# Patient Record
Sex: Female | Born: 1960
Health system: Southern US, Community
[De-identification: ages and names within clinical notes are randomized; demographics above are authoritative.]

## PROBLEM LIST (undated history)

## (undated) DIAGNOSIS — D649 Anemia, unspecified: Secondary | ICD-10-CM

## (undated) DIAGNOSIS — I10 Essential (primary) hypertension: Secondary | ICD-10-CM

## (undated) DIAGNOSIS — N182 Chronic kidney disease, stage 2 (mild): Secondary | ICD-10-CM

## (undated) DIAGNOSIS — K219 Gastro-esophageal reflux disease without esophagitis: Secondary | ICD-10-CM

## (undated) DIAGNOSIS — E785 Hyperlipidemia, unspecified: Secondary | ICD-10-CM

## (undated) DIAGNOSIS — K922 Gastrointestinal hemorrhage, unspecified: Secondary | ICD-10-CM

## (undated) DIAGNOSIS — I213 ST elevation (STEMI) myocardial infarction of unspecified site: Secondary | ICD-10-CM

## (undated) DIAGNOSIS — IMO0001 Reserved for inherently not codable concepts without codable children: Secondary | ICD-10-CM

## (undated) DIAGNOSIS — E119 Type 2 diabetes mellitus without complications: Secondary | ICD-10-CM

## (undated) DIAGNOSIS — I2510 Atherosclerotic heart disease of native coronary artery without angina pectoris: Secondary | ICD-10-CM

## (undated) DIAGNOSIS — I509 Heart failure, unspecified: Secondary | ICD-10-CM

## (undated) DIAGNOSIS — E041 Nontoxic single thyroid nodule: Secondary | ICD-10-CM

## (undated) HISTORY — DX: Type 2 diabetes mellitus without complications: E11.9

## (undated) HISTORY — DX: Anemia, unspecified: D64.9

## (undated) HISTORY — PX: COLONOSCOPY: SHX174

## (undated) HISTORY — PX: APPENDECTOMY: SHX54

## (undated) HISTORY — DX: Gastrointestinal hemorrhage, unspecified: K92.2

## (undated) HISTORY — DX: Chronic kidney disease, stage 2 (mild): N18.2

## (undated) HISTORY — DX: Gastro-esophageal reflux disease without esophagitis: K21.9

---

## 1999-08-14 ENCOUNTER — Encounter: Payer: Self-pay | Admitting: Emergency Medicine

## 1999-08-14 ENCOUNTER — Emergency Department (HOSPITAL_COMMUNITY): Admission: EM | Admit: 1999-08-14 | Discharge: 1999-08-14 | Payer: Self-pay | Admitting: Emergency Medicine

## 2001-05-31 ENCOUNTER — Encounter: Payer: Self-pay | Admitting: Emergency Medicine

## 2001-05-31 ENCOUNTER — Emergency Department (HOSPITAL_COMMUNITY): Admission: EM | Admit: 2001-05-31 | Discharge: 2001-05-31 | Payer: Self-pay | Admitting: Emergency Medicine

## 2001-06-28 ENCOUNTER — Emergency Department (HOSPITAL_COMMUNITY): Admission: EM | Admit: 2001-06-28 | Discharge: 2001-06-28 | Payer: Self-pay | Admitting: Emergency Medicine

## 2003-10-04 ENCOUNTER — Emergency Department (HOSPITAL_COMMUNITY): Admission: EM | Admit: 2003-10-04 | Discharge: 2003-10-04 | Payer: Self-pay | Admitting: Emergency Medicine

## 2004-12-15 ENCOUNTER — Emergency Department (HOSPITAL_COMMUNITY): Admission: EM | Admit: 2004-12-15 | Discharge: 2004-12-15 | Payer: Self-pay | Admitting: Emergency Medicine

## 2008-08-17 ENCOUNTER — Emergency Department (HOSPITAL_COMMUNITY): Admission: EM | Admit: 2008-08-17 | Discharge: 2008-08-17 | Payer: Self-pay | Admitting: Emergency Medicine

## 2008-08-28 ENCOUNTER — Emergency Department (HOSPITAL_COMMUNITY): Admission: EM | Admit: 2008-08-28 | Discharge: 2008-08-28 | Payer: Self-pay | Admitting: Emergency Medicine

## 2010-05-07 ENCOUNTER — Ambulatory Visit (HOSPITAL_COMMUNITY): Admission: RE | Admit: 2010-05-07 | Discharge: 2010-05-07 | Payer: Self-pay | Admitting: Family Medicine

## 2011-01-11 ENCOUNTER — Emergency Department (HOSPITAL_COMMUNITY)
Admission: EM | Admit: 2011-01-11 | Discharge: 2011-01-11 | Disposition: A | Discharge status: Home / Self Care | Payer: Self-pay | Attending: Emergency Medicine | Admitting: Emergency Medicine

## 2011-01-11 DIAGNOSIS — L298 Other pruritus: Secondary | ICD-10-CM | POA: Insufficient documentation

## 2011-01-11 DIAGNOSIS — R21 Rash and other nonspecific skin eruption: Secondary | ICD-10-CM | POA: Insufficient documentation

## 2011-01-11 DIAGNOSIS — T7840XA Allergy, unspecified, initial encounter: Secondary | ICD-10-CM | POA: Insufficient documentation

## 2011-01-11 DIAGNOSIS — L2989 Other pruritus: Secondary | ICD-10-CM | POA: Insufficient documentation

## 2011-01-11 DIAGNOSIS — I10 Essential (primary) hypertension: Secondary | ICD-10-CM | POA: Insufficient documentation

## 2011-08-09 LAB — GLUCOSE, CAPILLARY: Glucose-Capillary: 91

## 2011-08-09 LAB — POCT I-STAT, CHEM 8
BUN: 6
Calcium, Ion: 1.07 — ABNORMAL LOW
Chloride: 107
Glucose, Bld: 88
HCT: 36
Potassium: 3.7

## 2011-08-09 LAB — CBC
Hemoglobin: 11.3 — ABNORMAL LOW
MCHC: 32.2
RBC: 4.25
WBC: 5.9

## 2011-08-09 LAB — DIFFERENTIAL
Lymphocytes Relative: 39
Lymphs Abs: 2.3
Monocytes Absolute: 0.7
Monocytes Relative: 13 — ABNORMAL HIGH
Neutro Abs: 2.6
Neutrophils Relative %: 44

## 2011-12-20 ENCOUNTER — Emergency Department (HOSPITAL_COMMUNITY): Payer: Self-pay

## 2011-12-20 ENCOUNTER — Encounter: Payer: Self-pay | Admitting: Family Medicine

## 2011-12-20 ENCOUNTER — Ambulatory Visit (INDEPENDENT_AMBULATORY_CARE_PROVIDER_SITE_OTHER): Payer: Self-pay | Admitting: Family Medicine

## 2011-12-20 ENCOUNTER — Observation Stay (HOSPITAL_COMMUNITY)
Admission: EM | Admit: 2011-12-20 | Discharge: 2011-12-21 | Disposition: A | Discharge status: Home / Self Care | Payer: Self-pay | Attending: Emergency Medicine | Admitting: Emergency Medicine

## 2011-12-20 ENCOUNTER — Encounter (HOSPITAL_COMMUNITY): Payer: Self-pay | Admitting: *Deleted

## 2011-12-20 ENCOUNTER — Other Ambulatory Visit: Payer: Self-pay

## 2011-12-20 DIAGNOSIS — R110 Nausea: Secondary | ICD-10-CM

## 2011-12-20 DIAGNOSIS — M79669 Pain in unspecified lower leg: Secondary | ICD-10-CM

## 2011-12-20 DIAGNOSIS — R0600 Dyspnea, unspecified: Secondary | ICD-10-CM

## 2011-12-20 DIAGNOSIS — M7989 Other specified soft tissue disorders: Secondary | ICD-10-CM

## 2011-12-20 DIAGNOSIS — R079 Chest pain, unspecified: Secondary | ICD-10-CM

## 2011-12-20 DIAGNOSIS — M546 Pain in thoracic spine: Secondary | ICD-10-CM

## 2011-12-20 DIAGNOSIS — M25569 Pain in unspecified knee: Secondary | ICD-10-CM

## 2011-12-20 DIAGNOSIS — R61 Generalized hyperhidrosis: Secondary | ICD-10-CM | POA: Insufficient documentation

## 2011-12-20 DIAGNOSIS — R0602 Shortness of breath: Secondary | ICD-10-CM | POA: Insufficient documentation

## 2011-12-20 LAB — D-DIMER, QUANTITATIVE: D-Dimer, Quant: 0.57 ug/mL-FEU — ABNORMAL HIGH (ref 0.00–0.48)

## 2011-12-20 LAB — CBC
HCT: 39.9 % (ref 36.0–46.0)
Hemoglobin: 13.7 g/dL (ref 12.0–15.0)
MCH: 31.6 pg (ref 26.0–34.0)
MCHC: 34.3 g/dL (ref 30.0–36.0)

## 2011-12-20 LAB — BASIC METABOLIC PANEL
CO2: 20 mEq/L (ref 19–32)
Calcium: 9.3 mg/dL (ref 8.4–10.5)
Chloride: 105 mEq/L (ref 96–112)
Creatinine, Ser: 0.9 mg/dL (ref 0.50–1.10)
Glucose, Bld: 73 mg/dL (ref 70–99)

## 2011-12-20 LAB — TROPONIN I: Troponin I: <0.3 ng/mL (ref <0.30)

## 2011-12-20 MED ORDER — ASPIRIN 81 MG PO CHEW: 324.0000 mg | CHEWABLE_TABLET | Freq: Once | ORAL | Status: DC

## 2011-12-20 MED ORDER — ASPIRIN 81 MG PO CHEW
324.0000 mg | CHEWABLE_TABLET | Freq: Once | ORAL | Status: AC
  Administered 2011-12-20: 324 mg via ORAL

## 2011-12-20 MED ORDER — NITROGLYCERIN 0.4 MG SL SUBL
0.4000 mg | SUBLINGUAL_TABLET | SUBLINGUAL | Status: DC | PRN
  Administered 2011-12-20: 0.4 mg via SUBLINGUAL
  Filled 2011-12-20: qty 25

## 2011-12-20 MED ORDER — HYDROXYZINE HCL 25 MG PO TABS
25.0000 mg | ORAL_TABLET | Freq: Once | ORAL | Status: AC
  Administered 2011-12-20: 25 mg via ORAL
  Filled 2011-12-20: qty 1

## 2011-12-20 NOTE — ED Provider Notes (Signed)
History     CSN: [id number]  Arrival date & time 12/20/11  1230   First MD Initiated Contact with Patient 12/20/11 1250      Chief Complaint  Patient presents with  . Chest Pain    (Consider location/radiation/quality/duration/timing/severity/associated sxs/prior treatment) HPI Comments: 51 yo female with onset of lower extremity hives yesterday and chest pain this morning.  Pain described as left sides, radiating to left arm and jaw, pressure and sharp like, worse with exertion, and associated with shortness of breath, nausea, and diaphoresis.    Patient is a 51 y.o. female presenting with chest pain. The history is provided by the patient.  Chest Pain The chest pain began 3 - 5 hours ago. Chest pain occurs constantly. The chest pain is unchanged. The severity of the pain is severe. The quality of the pain is described as sharp and pressure-like. The pain radiates to the left jaw and left arm. Exacerbated by: not worse with breathing. Primary symptoms include shortness of breath and nausea. Pertinent negatives for primary symptoms include no fever, no cough, no abdominal pain and no vomiting.  Associated symptoms include diaphoresis and lower extremity edema. She tried nothing for the symptoms.     History reviewed. No pertinent past medical history.  Past Surgical History  Procedure Date  . Appendectomy   . Cesarean section     No family history on file.  History  Substance Use Topics  . Smoking status: Never Smoker   . Smokeless tobacco: Not on file  . Alcohol Use: No    OB History    Grav Para Term Preterm Abortions TAB SAB Ect Mult Living                  Review of Systems  Constitutional: Positive for diaphoresis. Negative for fever.  HENT: Negative for congestion.   Respiratory: Positive for shortness of breath. Negative for cough.   Cardiovascular: Positive for chest pain.  Gastrointestinal: Positive for nausea. Negative for vomiting, abdominal pain and  diarrhea.  Genitourinary: Negative for difficulty urinating.  Skin: Positive for rash.  All other systems reviewed and are negative.    Allergies  Review of patient's allergies indicates no known allergies.  Home Medications  No current outpatient prescriptions on file.  BP 161/93  Pulse 76  Temp(Src) 98.2 F (36.8 C) (Oral)  Resp 18  SpO2 98%  Physical Exam  Nursing note and vitals reviewed. Constitutional: She is oriented to person, place, and time. She appears well-developed and well-nourished. No distress.  HENT:  Head: Normocephalic and atraumatic.  Mouth/Throat: Oropharynx is clear and moist.  Eyes: Conjunctivae are normal. Pupils are equal, round, and reactive to light. No scleral icterus.  Neck: Neck supple.  Cardiovascular: Normal rate, regular rhythm, normal heart sounds and intact distal pulses.   No murmur heard. Pulmonary/Chest: Effort normal and breath sounds normal. No stridor. No respiratory distress. She has no rales.  Abdominal: Soft. Bowel sounds are normal. She exhibits no distension. There is no tenderness.  Musculoskeletal: Normal range of motion.       Mild left foot edema.  2+ DP pulses bilaterally.  Neurological: She is alert and oriented to person, place, and time.  Skin: Skin is warm and dry. No rash noted.  Psychiatric: She has a normal mood and affect. Her behavior is normal.    ED Course  Procedures (including critical care time)  Labs Reviewed  BASIC METABOLIC PANEL - Abnormal; Notable for the following:  GFR calc non Af Amer 73 (*)    GFR calc Af Amer 84 (*)    All other components within normal limits  D-DIMER, QUANTITATIVE - Abnormal; Notable for the following:    D-Dimer, Quant 0.57 (*)    All other components within normal limits  TROPONIN I  CBC  DIFFERENTIAL   Dg Chest 2 View  12/20/2011  *RADIOLOGY REPORT*  Clinical Data: Chest pain  CHEST - 2 VIEW  Comparison: None.  Findings: Lungs are clear. No pleural effusion or  pneumothorax.  Cardiomediastinal silhouette is within normal limits.  Mild degenerative changes of the visualized thoracolumbar spine.  IMPRESSION: No evidence of acute cardiopulmonary disease.  Original Report Authenticated By: Charline Bills, M.D.   All radiology studies independently viewed by me.      Date: 12/20/2011  Rate: 79  Rhythm: normal sinus rhythm  QRS Axis: normal  Intervals: normal  ST/T Wave abnormalities: T wave inversions in III, aVF, V3-6  Conduction Disutrbances:none  Narrative Interpretation:   Old EKG Reviewed: T wave inversions now present in aVF, V3-6    1. Chest pain       MDM  51 yo female with onset of left sided chest pain this morning.  Immediate symptomology sounds consistent with anginal chest pain.  However, she is low risk (TIMI 0) and she has other symptoms concurrently, including an apparent allergic reaction which began yesterday.  Furthermore, she has unilateral left pedal edema.  Will initiate ACS workup and D-Dimer.    ACS workup initially reassuring.  D-dimer mildly elevated.  Will check LE dopplers.  If dopplers negative, do not think that she needs further testing for PE.  (She has no hypoxia, tachycardia, or pleuritic chest pain).  Do feel that she needs further ACS, so will place in CDU chest pain protocol.  CBC still pending at this time.        Warnell Forester, MD 12/20/11 [phone]

## 2011-12-20 NOTE — ED Notes (Addendum)
Patient presents to ed c/o via [facility] from urgent care c/o pain between  Her shoulder blades and noticed a rash on her lower legs last pm, states she had swelling to her left ankle with redness bialteral pedal pulses present. States she took benadryl last pm relieved the rash however feels the ankle is still swollen. States this am at 9am had sudden onset midsternal chest pain with radiation to her left arm/co sob with nausea this am. Patient was given asax4 and NTG x 1 at the urgent care. Patient was 10/10. EMS gave NTG x1. Pain is now 3/10.

## 2011-12-20 NOTE — Progress Notes (Signed)
  Subjective:    Patient ID: Virginia [name], female    DOB: Jun 18, 1961, 51 y.o.   MRN: [id number]  HPI  Virginia [name] is a 51 y.o. female  ** pulled emergently to see pt from front/triage due to cp, nausea and in bathroom, pt pulled emergently to room 7.  C/o chest pain.  Started yesterday with upper back pain without inciting injury or cause.  Later in day noticed rash on lower left leg, and swelling.  Thought was allergic rxn to food - took benadryl x 3 since yesterday - last dose 0530 today..  This am at 0900 noticed shortness of breath, and abrupt onset chest pain and pressure, 10/10 in nature, with left arm radiation, and nausea - no vomiting, flushing, and dizziness.    Also noticed more swelling and knot in lower left calf. Pain 8/10 currently. No other treatment taken. No recent air travel, prolonged car rides, surgery, or other known PE risk factors.    SH: Past[place], nonsmoker, no illicit drugs  FH: mom with CHF in early 60's  Review of Systems  Constitutional: Negative for fever.       Flushing feeling past few days - thought was hot flushes - no fevers noted.   Respiratory: Positive for chest tightness and shortness of breath.   Cardiovascular: Positive for chest pain and leg swelling. Negative for palpitations.  Gastrointestinal: Negative for abdominal pain.  Skin: Positive for color change and rash.  Neurological: Positive for dizziness and light-headedness. Negative for facial asymmetry, speech difficulty and weakness.      Objective:   Physical Exam  Constitutional: She is oriented to person, place, and time. She appears well-developed and well-nourished. She appears distressed.       Appears anxious.  HENT:  Head: Normocephalic and atraumatic.  Mouth/Throat: Oropharynx is clear and moist.  Eyes: EOM are normal. Pupils are equal, round, and reactive to light.  Neck: No tracheal deviation present.  Cardiovascular: Regular rhythm, normal heart sounds and intact distal  pulses.   No extrasystoles are present. Tachycardia present.  PMI is not displaced.   Pulmonary/Chest: Effort normal. No stridor. No respiratory distress. She has no wheezes. She has no rales. She exhibits no tenderness.  Abdominal: Soft. She exhibits no distension. There is no tenderness.  Neurological: She is alert and oriented to person, place, and time. She has normal strength. No sensory deficit.  Skin: Skin is warm.     Psychiatric: Her speech is normal and behavior is normal. Her mood appears anxious.    EKG: sr, rate 82, TWI in III, flat t in AVF.     Assessment & Plan:  Virginia [name] is a 51 y.o. female with :  1. Chest pain   2. Dyspnea   3. Thoracic back pain   4. Calf pain   5. Leg swelling   6. Nausea   DDX: early cellulitis of leg vs DVT with chest pain concerning for PE, acute coronary syndrome, Gerd, vs aneurysm with marked elevation of BP.  No apparent acute findings on EKG, and no focal neurologic findings.  O2 [place] placed, IV, monitor placed, aspirin 324mg  chewable, and nitroglycerin 0.4mg  SL given here, EMS transport to [facility]. - see Emergency Care flowsheet (scanned) for further details.

## 2011-12-20 NOTE — ED Provider Notes (Signed)
Patient in CDU under chest pain protocol.  Slightly elevated d-dimer.  Venous doppler of extremity negative.  12 lead reviewed, no evidence of ischemia.  Serial cardiac markers currently negative.  Patient resting comfortably without return of chest pain.  Lungs CTA bilaterally. S1/S2, RRR, no murmur.  Abdomen soft, bowel sounds present.  Strong distal pulses palpated all extremities.  Patient scheduled for echo stress test in AM.  Treatment and diagnostic plan discussed with patient.  12:22 AM  Patient report provided to Dr. Judd Lien at shift change.  Jimmye Norman, NP 12/21/11 Rich Fuchs

## 2011-12-20 NOTE — Progress Notes (Signed)
  Subjective:    Patient ID: Virginia [name], female    DOB: 15-Dec-1960, 51 y.o.   MRN: [id number]  HPI    Review of Systems     Objective:   Physical Exam    Procedure: 22g IV NS placed at 1142 at [facility].    Assessment & Plan:

## 2011-12-20 NOTE — Progress Notes (Signed)
*  PRELIMINARY RESULTS* Vascular Ultrasound Left lower extremity venous duplex has been completed.  Preliminary findings: Left leg negative for deep and superficial vein thrombosis.  Vanna Scotland 12/20/2011, 8:52 PM

## 2011-12-21 DIAGNOSIS — R072 Precordial pain: Secondary | ICD-10-CM

## 2011-12-21 MED ORDER — DIPHENHYDRAMINE HCL 25 MG PO CAPS
25.0000 mg | ORAL_CAPSULE | Freq: Once | ORAL | Status: AC
  Administered 2011-12-21: 25 mg via ORAL
  Filled 2011-12-21: qty 1

## 2011-12-21 NOTE — ED Provider Notes (Signed)
7:57 AM Patient is in CDU under observation, chest pain, protocol.  Patient is chest pain-free at this time.  On exam, pt is A&Ox4, NAD, RRR, no m/r/g, CTAB, abd soft, NT, extremities without edema, distal pulses intact and equal bilaterally.  Plan is for exercise stress test this morning.    10:26 AM Dr Eden Emms called, stress echo is normal.   Discussed results with patient.  Plan is for discharge home.   Rise Patience, [place] 12/21/11 1114

## 2011-12-21 NOTE — Progress Notes (Signed)
  Echocardiogram 2D Echocardiogram has been performed.  Juanita Laster Shaylie Eklund 12/21/2011, 10:22 AM

## 2011-12-21 NOTE — Discharge Instructions (Signed)
Please call [facility] today for a close follow up appointment.  You may return to the ER at any time for worsening condition or any new symptoms that concern you.   Chest Pain (Nonspecific) It is often hard to give a specific diagnosis for the cause of chest pain. There is always a chance that your pain could be related to something serious, such as a heart attack or a blood clot in the lungs. You need to follow up with your caregiver for further evaluation. CAUSES   Heartburn.   Pneumonia or bronchitis.   Anxiety and stress.   Inflammation around your heart (pericarditis) or lung (pleuritis or pleurisy).   A blood clot in the lung.   A collapsed lung (pneumothorax). It can develop suddenly on its own (spontaneous pneumothorax) or from injury (trauma) to the chest.  The chest wall is composed of bones, muscles, and cartilage. Any of these can be the source of the pain.  The bones can be bruised by injury.   The muscles or cartilage can be strained by coughing or overwork.   The cartilage can be affected by inflammation and become sore (costochondritis).  DIAGNOSIS  Lab tests or other studies, such as X-rays, an EKG, stress testing, or cardiac imaging, may be needed to find the cause of your pain.  TREATMENT   Treatment depends on what may be causing your chest pain. Treatment may include:   Acid blockers for heartburn.   Anti-inflammatory medicine.   Pain medicine for inflammatory conditions.   Antibiotics if an infection is present.   You may be advised to change lifestyle habits. This includes stopping smoking and avoiding caffeine and chocolate.   You may be advised to keep your head raised (elevated) when sleeping. This reduces the chance of acid going backward from your stomach into your esophagus.   Most of the time, nonspecific chest pain will improve within 2 to 3 days with rest and mild pain medicine.  HOME CARE INSTRUCTIONS   If  antibiotics were prescribed, take the full amount even if you start to feel better.   For the next few days, avoid physical activities that bring on chest pain. Continue physical activities as directed.   Do not smoke cigarettes or drink alcohol until your symptoms are gone.   Only take over-the-counter or prescription medicine for pain, discomfort, or fever as directed by your caregiver.   Follow your caregiver's suggestions for further testing if your chest pain does not go away.   Keep any follow-up appointments you made. If you do not go to an appointment, you could develop lasting (chronic) problems with pain. If there is any problem keeping an appointment, you must call to reschedule.  SEEK MEDICAL CARE IF:   You think you are having problems from the medicine you are taking. Read your medicine instructions carefully.   Your chest pain does not go away, even after treatment.   You develop a rash with blisters on your chest.  SEEK IMMEDIATE MEDICAL CARE IF:   You have increased chest pain or pain that spreads to your arm, neck, jaw, back, or belly (abdomen).   You develop shortness of breath, an increasing cough, or you are coughing up blood.   You have severe back or abdominal pain, feel sick to your stomach (nauseous) or throw up (vomit).   You develop severe weakness, fainting, or chills.   You have an oral temperature above 102 F (38.9 C), not  controlled by medicine.  THIS IS AN EMERGENCY. Do not wait to see if the pain will go away. Get medical help at once. Call your local emergency services (911 in [place] Do not drive yourself to the hospital. MAKE SURE YOU:   Understand these instructions.   Will watch your condition.   Will get help right away if you are not doing well or get worse.  Document Released: 08/03/2005 Document Revised: 07/06/2011 Document Reviewed: 05/29/2008 [facility] Patient Information 2012 [place], [place].Chest Pain Observation It is often hard to  give a specific diagnosis for the cause of chest pain. Your symptoms had a chance of being caused by inadequate oxygen delivery to your heart (angina). Angina that is not treated or evaluated can lead to a heart attack (myocardial infarction, MI) or death. Blood tests, electrocardiograms, and X-rays may have been done to help determine a possible cause of your chest pain. After evaluation and observation, your caregiver has determined that it is unlikely your pain was caused by angina. However, a full evaluation of your pain needs to be completed. You need to follow up with caregivers or diagnostic testing as directed. It is very important to keep your follow-up appointments. Not keeping your follow-up appointments could result in permanent heart damage, disability, or death. If there is any problem keeping your follow-up appointments, you must call your caregiver. HOME CARE INSTRUCTIONS  Due to the slight chance that your pain could be angina, it is important to follow healthy lifestyle habits and follow your caregiver's treatment plan:  Maintain a healthy weight.   Stay physically active and exercise regularly.   Decrease your salt intake.   Eat a diet low in saturated fats and cholesterol. Avoid foods fried in oil or made with fat. Talk to a dietician to learn about heart healthy foods.   Increase your fiber intake by including whole grains, vegetable, and fruits in your diet.   Avoid situations that cause stress, anger, or depression.   Take medication as advised by your caregiver. Report any side effects to your caregiver. Do not stop medications or adjust the dosages on your own.   Quit smoking. Do not use nicotine patches or gum until you check with your caregiver.   Keep your blood pressure, blood sugar, and cholesterol levels within normal limits.   Limit alcohol intake to no more than 1 drink per day for nonpregnant women and 2 drinks per day for men.   Stop abusing drugs.  SEEK  MEDICAL CARE IF: You have severe chest pain or pressure which may include symptoms such as:  Pain or pressure in the arms, neck, jaw, or back.   Profuse sweating.   Feeling sick to your stomach (nauseous).   Feeling short of breath while at rest.   Having a fast or irregular heartbeat.   You have chest pain that does not get better after rest or after taking your usual medicine.   You wake from sleep with chest pain.   You feel dizzy, faint, or experience extreme fatigue.   You notice increasing shortness of breath during rest, sleep, or with activity.   You are unable to sleep because you cannot breathe.   You develop a frequent cough or you are coughing up blood.   You have severe back or abdominal pain, are nauseated, or throw up (vomit).   You develop severe weakness, dizziness, fainting, or chills.  Any of these symptoms may represent a serious problem that is an emergency. Do not  wait to see if the symptoms will go away. Call your local emergency services (911 in the [place] Do not drive yourself to the hospital. MAKE SURE YOU:  Understand these instructions.   Will watch your condition.   Will get help right away if you are not doing well or get worse.  Document Released: 11/26/2010 Document Revised: 07/06/2011 Document Reviewed: 11/26/2010 [facility] Patient Information 2012 [place], [place].

## 2011-12-21 NOTE — ED Provider Notes (Signed)
I saw and evaluated the patient, reviewed the resident's note and I agree with the findings and plan.   .Face to face Exam:  General:  Awake HEENT:  Atraumatic Resp:  Normal effort Abd:  Nondistended Neuro:No focal weakness Lymph: No adenopathy   Nelia Shi, MD 12/21/11 1051

## 2011-12-21 NOTE — Progress Notes (Signed)
Observation review is complete. 

## 2011-12-21 NOTE — ED Provider Notes (Signed)
Medical screening examination/treatment/procedure(s) were performed by non-physician practitioner and as supervising physician I was immediately available for consultation/collaboration. Patient seen under supervision of Dr.Beaton and transferred to CDU awaiting cardiac workup  Doug Sou, MD 12/21/11 [phone]

## 2012-03-21 ENCOUNTER — Emergency Department (HOSPITAL_COMMUNITY)
Admission: EM | Admit: 2012-03-21 | Discharge: 2012-03-21 | Disposition: A | Discharge status: Home / Self Care | Payer: Self-pay | Attending: Emergency Medicine | Admitting: Emergency Medicine

## 2012-03-21 ENCOUNTER — Encounter (HOSPITAL_COMMUNITY): Payer: Self-pay | Admitting: *Deleted

## 2012-03-21 DIAGNOSIS — S0990XA Unspecified injury of head, initial encounter: Secondary | ICD-10-CM | POA: Insufficient documentation

## 2012-03-21 DIAGNOSIS — W2209XA Striking against other stationary object, initial encounter: Secondary | ICD-10-CM | POA: Insufficient documentation

## 2012-03-21 MED ORDER — HYDROCODONE-ACETAMINOPHEN 5-325 MG PO TABS: 1.0000 | ORAL_TABLET | Freq: Four times a day (QID) | ORAL | Status: AC | PRN

## 2012-03-21 MED ORDER — TETANUS-DIPHTH-ACELL PERTUSSIS 5-2.5-18.5 LF-MCG/0.5 IM SUSP
0.5000 mL | Freq: Once | INTRAMUSCULAR | Status: AC
  Administered 2012-03-21: 0.5 mL via INTRAMUSCULAR
  Filled 2012-03-21: qty 0.5

## 2012-03-21 MED ORDER — IBUPROFEN 200 MG PO TABS
600.0000 mg | ORAL_TABLET | Freq: Once | ORAL | Status: AC
  Administered 2012-03-21: 600 mg via ORAL
  Filled 2012-03-21: qty 3

## 2012-03-21 MED ORDER — IBUPROFEN 800 MG PO TABS: 800.0000 mg | ORAL_TABLET | Freq: Three times a day (TID) | ORAL | Status: AC

## 2012-03-21 NOTE — Discharge Instructions (Signed)
Use the resource guide listed below to help you find a doctor to follow up with if you do not already have one to followup with.Use your pain medication as prescribed and do not operate heavy machinery while on pain medication. Note that your pain medication contains acetaminophen (Tylenol) & its is not reccommended that you use additional acetaminophen (Tylenol) while taking this medication. Take your full course of antibiotics. Read the instructions below. Be sure to have a full meal with your Motrin.   Cryotherapy Cryotherapy means treatment with cold. Ice or gel packs can be used to reduce both pain and swelling. Ice is the most helpful within the first 24 to 48 hours after an injury or flareup from overusing a muscle or joint. Sprains, strains, spasms, burning pain, shooting pain, and aches can all be eased with ice. Ice can also be used when recovering from surgery. Ice is effective, has very few side effects, and is safe for most people to use. PRECAUTIONS  Ice is not a safe treatment option for people with:  Raynaud's phenomenon. This is a condition affecting small blood vessels in the extremities. Exposure to cold may cause your problems to return.   Cold hypersensitivity. There are many forms of cold hypersensitivity, including:   Cold urticaria. Red, itchy hives appear on the skin when the tissues begin to warm after being iced.   Cold erythema. This is a red, itchy rash caused by exposure to cold.   Cold hemoglobinuria. Red blood cells break down when the tissues begin to warm after being iced. The hemoglobin that carry oxygen are passed into the urine because they cannot combine with blood proteins fast enough.   Numbness or altered sensitivity in the area being iced.  If you have any of the following conditions, do not use ice until you have discussed cryotherapy with your caregiver:  Heart conditions, such as arrhythmia, angina, or chronic heart disease.   High blood pressure.     Healing wounds or open skin in the area being iced.   Current infections.   Rheumatoid arthritis.   Poor circulation.   Diabetes.  Ice slows the blood flow in the region it is applied. This is beneficial when trying to stop inflamed tissues from spreading irritating chemicals to surrounding tissues. However, if you expose your skin to cold temperatures for too long or without the proper protection, you can damage your skin or nerves. Watch for signs of skin damage due to cold. HOME CARE INSTRUCTIONS Follow these tips to use ice and cold packs safely.  Place a dry or damp towel between the ice and skin. A damp towel will cool the skin more quickly, so you may need to shorten the time that the ice is used.   For a more rapid response, add gentle compression to the ice.   Ice for no more than 10 to 20 minutes at a time. The bonier the area you are icing, the less time it will take to get the benefits of ice.   Check your skin after 5 minutes to make sure there are no signs of a poor response to cold or skin damage.   Rest 20 minutes or more in between uses.   Once your skin is numb, you can end your treatment. You can test numbness by very lightly touching your skin. The touch should be so light that you do not see the skin dimple from the pressure of your fingertip. When using ice, most people will  feel these normal sensations in this order: cold, burning, aching, and numbness.   Do not use ice on someone who cannot communicate their responses to pain, such as small children or people with dementia.  HOW TO MAKE AN ICE PACK Ice packs are the most common way to use ice therapy. Other methods include ice massage, ice baths, and cryo-sprays. Muscle creams that cause a cold, tingly feeling do not offer the same benefits that ice offers and should not be used as a substitute unless recommended by your caregiver. To make an ice pack, do one of the following:  Place crushed ice or a bag of  frozen vegetables in a sealable plastic bag. Squeeze out the excess air. Place this bag inside another plastic bag. Slide the bag into a pillowcase or place a damp towel between your skin and the bag.   Mix 3 parts water with 1 part rubbing alcohol. Freeze the mixture in a sealable plastic bag. When you remove the mixture from the freezer, it will be slushy. Squeeze out the excess air. Place this bag inside another plastic bag. Slide the bag into a pillowcase or place a damp towel between your skin and the bag.  SEEK MEDICAL CARE IF:  You develop white spots on your skin. This may give the skin a blotchy (mottled) appearance.   Your skin turns blue or pale.   Your skin becomes waxy or hard.   Your swelling gets worse.  MAKE SURE YOU:   Understand these instructions.   Will watch your condition.   Will get help right away if you are not doing well or get worse.  Document Released: 06/20/2011 Document Revised: 10/13/2011 Document Reviewed: 06/20/2011 [facility] Patient Information 2012 [place], [place].  RESOURCE GUIDE  Dental Problems  Patients with Medicaid: [facility] [phone]                                           [phone] [address] [employer] [address] Phone:  [phone]                                                  Phone:  [phone]  If unable to pay or uninsured, contact:  [facility] or [facility]. to become qualified for the adult dental clinic.  Chronic Pain Problems Contact [facility]  [phone] Patients need to be referred by their primary care doctor.  Insufficient Money for Medicine Contact [employer] Way:  call "211" or [facility] [phone].  No Primary Care Doctor Call [facility]  [phone] Other agencies that provide inexpensive medical care    [facility]  [phone]    [facility]  [phone]    [facility]  9516189505     [facility]  [phone]    [facility]  6073442825    [facility]  [phone]  Psychological Services [facility]  [phone] [facility]  [phone] [facility]   [phone] (emergency services [phone])  Substance Abuse Resources [facility]  [phone] Addiction  [facility] [phone] [facility] [place] [phone] Floydene Flock [phone] Residential & Outpatient Substance Abuse Program  [phone]  Abuse/Neglect [facility] Child Abuse Hotline 2690283182 [facility] Child Abuse Hotline 612-771-7561 (After Hours)  Emergency Shelter [facility] [phone]  Maternity Homes Room at the [place] [facility] [place] [phone] [facility] [phone]  MRSA Hotline #:   815-318-7738    [facility] Resources  [facility]     [employer]                          [facility] Dept. [address] [place]                       [address]      [address]  Blondell Reveal Phone:  [phone]                                   Phone:  [phone]                 Phone:  [phone]  [facility] Mental Health Phone:  [phone]  [facility] Child Abuse Hotline [phone] [phone] (After Hours)

## 2012-03-21 NOTE — ED Provider Notes (Signed)
History     CSN: [id number]  Arrival date & time 03/21/12  1643   First MD Initiated Contact with Patient 03/21/12 1706      Chief Complaint  Patient presents with  . Head Injury    (Consider location/radiation/quality/duration/timing/severity/associated sxs/prior treatment) Patient is a 51 y.o. female presenting with head injury. The history is provided by the patient.  Head Injury  The incident occurred 1 to 2 hours ago. She came to the ER via walk-in. Injury mechanism: walked into sliding glass door. There was no loss of consciousness. There was no blood loss. The quality of the pain is described as throbbing. The pain is at a severity of 4/10. The pain is mild. The pain has been constant since the injury. Pertinent negatives include no numbness, no blurred vision, no vomiting, no tinnitus, no disorientation, no weakness and no memory loss. She was found conscious by EMS personnel. Treatment prior to arrival: none. She has tried nothing for the symptoms.    History reviewed. No pertinent past medical history.  Past Surgical History  Procedure Date  . Appendectomy   . Cesarean section     No family history on file.  History  Substance Use Topics  . Smoking status: Never Smoker   . Smokeless tobacco: Not on file  . Alcohol Use: No    OB History    Grav Para Term Preterm Abortions TAB SAB Ect Mult Living                  Review of Systems  Constitutional: Negative for fever, chills and appetite change.  HENT: Negative for congestion, neck pain, neck stiffness and tinnitus.   Eyes: Negative for blurred vision and visual disturbance.  Respiratory: Negative for shortness of breath.   Cardiovascular: Negative for chest pain and leg swelling.  Gastrointestinal: Negative for vomiting and abdominal pain.  Genitourinary: Negative for dysuria, urgency and frequency.  Neurological: Positive for headaches. Negative for dizziness, syncope, weakness, light-headedness and  numbness.  Psychiatric/Behavioral: Negative for memory loss and confusion.  All other systems reviewed and are negative.    Allergies  Review of patient's allergies indicates no known allergies.  Home Medications  No current outpatient prescriptions on file.  BP 156/95  Pulse 87  Temp 98.9 F (37.2 C)  Resp 16  SpO2 99%  Physical Exam  Nursing note and vitals reviewed. Constitutional: She is oriented to person, place, and time. She appears well-developed and well-nourished. No distress.  HENT:  Head: Normocephalic.       Mild tenderness to palpation over nasal bone, no septal hematoma or epistaxis.  No raccoon sign or Battle sign.  No tenderness to palpation over the superior and inferior orbits.  Mild superficial 0.3 cm laceration of upper lip, not currently bleeding.   Eyes: Conjunctivae and EOM are normal.       No pain or catching with EOMs.  No evidence of orbital swelling or erythema.    Neck: Normal range of motion.  Pulmonary/Chest: Effort normal.  Musculoskeletal: Normal range of motion.  Neurological: She is alert and oriented to person, place, and time.       CN III-VII, coordination, gait, & sensation intact. Strength 5/5 bilaterally. Negative romberg.   Skin: Skin is warm and dry. No rash noted. She is not diaphoretic.  Psychiatric: She has a normal mood and affect. Her behavior is normal.    ED Course  Procedures (including critical care time)  Labs Reviewed - No data to  display No results found.   No diagnosis found.    MDM  Head trauma    Pt HA treated and improved while in ED.  Presentation is like pts typical HA and non concerning for [facility], ICH, Meningitis, or temporal arteritis. PE non-concerning for facial fractures. Nasal airway intact. Pt is afebrile with no focal neuro deficits, nuchal rigidity, or change in vision. Pt is to follow up with PCP. Discussed symptoms of post concussive syndrome and reasons to return to ED. Pt verbalizes  understanding and is agreeable with plan to dc.         Jaci Carrel, [place] 03/21/12 1759

## 2012-03-21 NOTE — ED Notes (Signed)
Pt states walked in to glass door. Reports headache. No injury/lacerations noted. No LOC.

## 2012-03-21 NOTE — ED Provider Notes (Signed)
Medical screening examination/treatment/procedure(s) were performed by non-physician practitioner and as supervising physician I was immediately available for consultation/collaboration.   Celene Kras, MD 03/21/12 [phone]

## 2015-04-12 ENCOUNTER — Encounter (HOSPITAL_COMMUNITY): Payer: Self-pay | Admitting: Emergency Medicine

## 2015-04-12 ENCOUNTER — Emergency Department (HOSPITAL_COMMUNITY): Payer: Self-pay

## 2015-04-12 ENCOUNTER — Emergency Department (HOSPITAL_COMMUNITY)
Admission: EM | Admit: 2015-04-12 | Discharge: 2015-04-12 | Disposition: A | Discharge status: Home / Self Care | Payer: Self-pay | Attending: Emergency Medicine | Admitting: Emergency Medicine

## 2015-04-12 DIAGNOSIS — S6992XA Unspecified injury of left wrist, hand and finger(s), initial encounter: Secondary | ICD-10-CM | POA: Insufficient documentation

## 2015-04-12 DIAGNOSIS — E079 Disorder of thyroid, unspecified: Secondary | ICD-10-CM | POA: Insufficient documentation

## 2015-04-12 DIAGNOSIS — Y92410 Unspecified street and highway as the place of occurrence of the external cause: Secondary | ICD-10-CM | POA: Insufficient documentation

## 2015-04-12 DIAGNOSIS — S8992XA Unspecified injury of left lower leg, initial encounter: Secondary | ICD-10-CM | POA: Insufficient documentation

## 2015-04-12 DIAGNOSIS — S99912A Unspecified injury of left ankle, initial encounter: Secondary | ICD-10-CM | POA: Insufficient documentation

## 2015-04-12 DIAGNOSIS — S79912A Unspecified injury of left hip, initial encounter: Secondary | ICD-10-CM | POA: Insufficient documentation

## 2015-04-12 DIAGNOSIS — T148 Other injury of unspecified body region: Secondary | ICD-10-CM | POA: Insufficient documentation

## 2015-04-12 DIAGNOSIS — S199XXA Unspecified injury of neck, initial encounter: Secondary | ICD-10-CM | POA: Insufficient documentation

## 2015-04-12 DIAGNOSIS — Y9389 Activity, other specified: Secondary | ICD-10-CM | POA: Insufficient documentation

## 2015-04-12 DIAGNOSIS — Y998 Other external cause status: Secondary | ICD-10-CM | POA: Insufficient documentation

## 2015-04-12 DIAGNOSIS — M542 Cervicalgia: Secondary | ICD-10-CM

## 2015-04-12 DIAGNOSIS — S299XXA Unspecified injury of thorax, initial encounter: Secondary | ICD-10-CM | POA: Insufficient documentation

## 2015-04-12 DIAGNOSIS — T148XXA Other injury of unspecified body region, initial encounter: Secondary | ICD-10-CM

## 2015-04-12 DIAGNOSIS — S3992XA Unspecified injury of lower back, initial encounter: Secondary | ICD-10-CM | POA: Insufficient documentation

## 2015-04-12 DIAGNOSIS — M25572 Pain in left ankle and joints of left foot: Secondary | ICD-10-CM

## 2015-04-12 DIAGNOSIS — E0789 Other specified disorders of thyroid: Secondary | ICD-10-CM

## 2015-04-12 MED ORDER — NAPROXEN 500 MG PO TABS: 500.0000 mg | ORAL_TABLET | Freq: Two times a day (BID) | ORAL | Status: DC

## 2015-04-12 MED ORDER — METHOCARBAMOL 500 MG PO TABS: 1000.0000 mg | ORAL_TABLET | Freq: Four times a day (QID) | ORAL | Status: DC

## 2015-04-12 MED ORDER — IBUPROFEN 800 MG PO TABS
800.0000 mg | ORAL_TABLET | Freq: Once | ORAL | Status: AC
  Administered 2015-04-12: 800 mg via ORAL
  Filled 2015-04-12: qty 1
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
## 2015-04-12 NOTE — ED Notes (Signed)
Patient transported to X-ray 
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
## 2015-06-16 ENCOUNTER — Inpatient Hospital Stay (HOSPITAL_COMMUNITY)
Admission: EM | Admit: 2015-06-16 | Discharge: 2015-06-19 | DRG: 249 | Disposition: A | Discharge status: Home / Self Care | Payer: Self-pay | Attending: Interventional Cardiology | Admitting: Interventional Cardiology

## 2015-06-16 DIAGNOSIS — I213 ST elevation (STEMI) myocardial infarction of unspecified site: Secondary | ICD-10-CM | POA: Diagnosis present

## 2015-06-16 DIAGNOSIS — E785 Hyperlipidemia, unspecified: Secondary | ICD-10-CM | POA: Diagnosis present

## 2015-06-16 DIAGNOSIS — Z791 Long term (current) use of non-steroidal anti-inflammatories (NSAID): Secondary | ICD-10-CM

## 2015-06-16 DIAGNOSIS — Z955 Presence of coronary angioplasty implant and graft: Secondary | ICD-10-CM

## 2015-06-16 DIAGNOSIS — Z79899 Other long term (current) drug therapy: Secondary | ICD-10-CM

## 2015-06-16 DIAGNOSIS — I2119 ST elevation (STEMI) myocardial infarction involving other coronary artery of inferior wall: Principal | ICD-10-CM | POA: Diagnosis present

## 2015-06-16 DIAGNOSIS — I2510 Atherosclerotic heart disease of native coronary artery without angina pectoris: Secondary | ICD-10-CM | POA: Diagnosis present

## 2015-06-16 HISTORY — DX: ST elevation (STEMI) myocardial infarction of unspecified site: I21.3

## 2015-06-16 HISTORY — DX: Hyperlipidemia, unspecified: E78.5

## 2015-06-16 NOTE — ED Provider Notes (Signed)
CSN: [id number]     Arrival date & time 06/16/15  2351 History  This chart was scribed for Virginia Fuel, MD by Randa Evens, ED Scribe. This patient was seen in room D33C/D33C and the patient's care was started at 11:47 PM.     Chief Complaint  Patient presents with  . Chest Pain   Patient is a 54 y.o. female presenting with chest pain. The history is provided by the patient. No language interpreter was used.  Chest Pain Pain location:  Substernal area Associated symptoms: diaphoresis, nausea, shortness of breath and vomiting    HPI Comments: Virginia [name] is a 54 y.o. female brought in by ambulance, who presents to the Emergency Department complaining of sudden CP onset tonight at 10 PM described as a pressure on her chest. Pt rates the severity of pain 8/10. Pt states she has had associated diaphoresis, nausea, vomiting, and SOB. Pt states she was at rest during the onset of pain. Pt states she took 1 aspirin with no relief. Ems gave her morphine and nitroglycerin with on slight relief. Pt denies any worsening or alleviating factors. Pt states that the pain is non radiating.   No past medical history on file. Past Surgical History  Procedure Laterality Date  . Appendectomy    . Cesarean section     No family history on file. History  Substance Use Topics  . Smoking status: Never Smoker   . Smokeless tobacco: Not on file  . Alcohol Use: No   OB History    No data available      Review of Systems  Constitutional: Positive for diaphoresis.  Respiratory: Positive for shortness of breath.   Cardiovascular: Positive for chest pain.  Gastrointestinal: Positive for nausea and vomiting.  All other systems reviewed and are negative.     Allergies  Review of patient's allergies indicates no known allergies.  Home Medications   Prior to Admission medications   Medication Sig Start Date End Date Taking? Authorizing Provider  methocarbamol (ROBAXIN) 500 MG tablet Take 2  tablets (1,000 mg total) by mouth 4 (four) times daily. 04/12/15   Carlisle Cater, PA-C  naproxen (NAPROSYN) 500 MG tablet Take 1 tablet (500 mg total) by mouth 2 (two) times daily. 04/12/15   Carlisle Cater, PA-C   Pulse 44  Temp(Src) 98.6 F (37 C) (Oral)  Resp 26  Ht 5\' 6"  (1.676 m)  Wt 180 lb (81.647 kg)  BMI 29.07 kg/m2  SpO2 99%   Physical Exam  Constitutional: She is oriented to person, place, and time. She appears well-developed and well-nourished.  Appears mildly uncomfortable.   HENT:  Head: Normocephalic and atraumatic.  Eyes: Conjunctivae and EOM are normal. Pupils are equal, round, and reactive to light.  Neck: Normal range of motion. Neck supple. No JVD present.  Cardiovascular: Normal rate and regular rhythm.   No murmur heard. Pulmonary/Chest: Effort normal and breath sounds normal. She has no wheezes. She has no rales. She exhibits no tenderness.  Abdominal: Soft. Bowel sounds are normal. She exhibits no distension and no mass. There is no tenderness.  Musculoskeletal: Normal range of motion. She exhibits no edema.  Lymphadenopathy:    She has no cervical adenopathy.  Neurological: She is alert and oriented to person, place, and time. No cranial nerve deficit. She exhibits normal muscle tone.  Skin: Skin is warm and dry. No rash noted.  Psychiatric: She has a normal mood and affect. Her behavior is normal. Judgment and thought content  normal.  Nursing note and vitals reviewed.   ED Course  Procedures (including critical care time) DIAGNOSTIC STUDIES: Oxygen Saturation is 99% on RA, normal by my interpretation.    COORDINATION OF CARE: 12:00 AM-Discussed treatment plan with pt at bedside and pt agreed to plan.    Labs Review Results for orders placed or performed during the hospital encounter of 06/16/15  CBC  Result Value Ref Range   WBC 9.8 4.0 - 10.5 K/uL   RBC 4.35 3.87 - 5.11 MIL/uL   Hemoglobin 12.9 12.0 - 15.0 g/dL   HCT 39.0 36.0 - 46.0 %   MCV 89.7  78.0 - 100.0 fL   MCH 29.7 26.0 - 34.0 pg   MCHC 33.1 30.0 - 36.0 g/dL   RDW 14.3 11.5 - 15.5 %   Platelets 189 150 - 400 K/uL  Differential  Result Value Ref Range   Neutrophils Relative % 44 43 - 77 %   Neutro Abs 4.3 1.7 - 7.7 K/uL   Lymphocytes Relative 47 (H) 12 - 46 %   Lymphs Abs 4.6 (H) 0.7 - 4.0 K/uL   Monocytes Relative 6 3 - 12 %   Monocytes Absolute 0.6 0.1 - 1.0 K/uL   Eosinophils Relative 3 0 - 5 %   Eosinophils Absolute 0.2 0.0 - 0.7 K/uL   Basophils Relative 0 0 - 1 %   Basophils Absolute 0.0 0.0 - 0.1 K/uL  Protime-INR  Result Value Ref Range   Prothrombin Time 15.0 11.6 - 15.2 seconds   INR 1.16 0.00 - 1.49  APTT  Result Value Ref Range   aPTT >200 (HH) 24 - 37 seconds  Basic metabolic panel  Result Value Ref Range   Sodium 139 135 - 145 mmol/L   Potassium 3.6 3.5 - 5.1 mmol/L   Chloride 104 101 - 111 mmol/L   CO2 23 22 - 32 mmol/L   Glucose, Bld 180 (H) 65 - 99 mg/dL   BUN 12 6 - 20 mg/dL   Creatinine, Ser 1.29 (H) 0.44 - 1.00 mg/dL   Calcium 9.1 8.9 - 10.3 mg/dL   GFR calc non Af Amer 46 (L) >60 mL/min   GFR calc Af Amer 53 (L) >60 mL/min   Anion gap 12 5 - 15  Troponin I  Result Value Ref Range   Troponin I 0.03 <0.031 ng/mL     EKG Interpretation   Date/Time:  Tuesday June 16 2015 23:56:26 EDT Ventricular Rate:  77 PR Interval:  148 QRS Duration: 71 QT Interval:  374 QTC Calculation: 423 R Axis:   76 Text Interpretation:  Sinus rhythm Atrial premature complex Inferior  infarct, acute (RCA) Lateral leads are also involved Probable RV  involvement, suggest recording right precordial leads When compared with  ECG of 12/20/2011, ** ** ACUTE MI / STEMI ** ** is now Present Confirmed by  [facility]  MD, Xianna Siverling ([id number]) on 06/17/2015 12:03:30 AM      MDM   Final diagnoses:  ST elevation myocardial infarction (STEMI), unspecified artery      Chest pain with ECG changes showing evidence of ST elevation myocardial infarction in inferior and  anterolateral leads. Code STEMI is activated. She is given heparin and given morphine for pain. She received aspirin at home. Case was discussed with Dr. Irish Lack who is on-call for  STEMI, and arrangements are made to take the patient to the catheterization lab for emergency PCI.   I personally performed the services described in this documentation, which was  scribed in my presence. The recorded information has been reviewed and is accurate.       Virginia Fuel, MD 75/30/10 4045

## 2015-06-16 NOTE — ED Notes (Signed)
Pt c/o sudden onset CP 0221, Dr Roxanne Mins called STEMI.

## 2015-06-17 ENCOUNTER — Inpatient Hospital Stay (HOSPITAL_COMMUNITY): Payer: Self-pay

## 2015-06-17 ENCOUNTER — Encounter (HOSPITAL_COMMUNITY)
Admission: EM | Disposition: A | Discharge status: Home / Self Care | Payer: Self-pay | Source: Home / Self Care | Attending: Interventional Cardiology

## 2015-06-17 ENCOUNTER — Encounter (HOSPITAL_COMMUNITY): Payer: Self-pay | Admitting: Emergency Medicine

## 2015-06-17 DIAGNOSIS — I2510 Atherosclerotic heart disease of native coronary artery without angina pectoris: Secondary | ICD-10-CM

## 2015-06-17 DIAGNOSIS — E785 Hyperlipidemia, unspecified: Secondary | ICD-10-CM | POA: Diagnosis present

## 2015-06-17 DIAGNOSIS — I2121 ST elevation (STEMI) myocardial infarction involving left circumflex coronary artery: Secondary | ICD-10-CM

## 2015-06-17 DIAGNOSIS — I213 ST elevation (STEMI) myocardial infarction of unspecified site: Secondary | ICD-10-CM | POA: Diagnosis present

## 2015-06-17 HISTORY — PX: CARDIAC CATHETERIZATION: SHX172

## 2015-06-17 LAB — POCT I-STAT, CHEM 8
BUN: 11 mg/dL (ref 6–20)
CHLORIDE: 102 mmol/L (ref 101–111)
CREATININE: 1 mg/dL (ref 0.44–1.00)
Calcium, Ion: 1.13 mmol/L (ref 1.12–1.23)
GLUCOSE: 188 mg/dL — AB (ref 65–99)
HCT: 40 % (ref 36.0–46.0)
Hemoglobin: 13.6 g/dL (ref 12.0–15.0)
POTASSIUM: 3.2 mmol/L — AB (ref 3.5–5.1)
Sodium: 139 mmol/L (ref 135–145)
TCO2: 23 mmol/L (ref 0–100)

## 2015-06-17 LAB — BASIC METABOLIC PANEL
Anion gap: 12 (ref 5–15)
Anion gap: 9 (ref 5–15)
BUN: 12 mg/dL (ref 6–20)
BUN: 10 mg/dL (ref 6–20)
CO2: 23 mmol/L (ref 22–32)
CO2: 25 mmol/L (ref 22–32)
Calcium: 9.1 mg/dL (ref 8.9–10.3)
Calcium: 8.7 mg/dL — ABNORMAL LOW (ref 8.9–10.3)
Chloride: 104 mmol/L (ref 101–111)
Chloride: 104 mmol/L (ref 101–111)
Creatinine, Ser: 1.29 mg/dL — ABNORMAL HIGH (ref 0.44–1.00)
Creatinine, Ser: 1.1 mg/dL — ABNORMAL HIGH (ref 0.44–1.00)
GFR calc Af Amer: 53 mL/min — ABNORMAL LOW (ref >60)
GFR calc non Af Amer: 46 mL/min — ABNORMAL LOW (ref >60)
GFR, EST NON AFRICAN AMERICAN: 55 mL/min — AB (ref >60)
Glucose, Bld: 180 mg/dL — ABNORMAL HIGH (ref 65–99)
Glucose, Bld: 143 mg/dL — ABNORMAL HIGH (ref 65–99)
POTASSIUM: 4.1 mmol/L (ref 3.5–5.1)
Potassium: 3.6 mmol/L (ref 3.5–5.1)
Sodium: 139 mmol/L (ref 135–145)
Sodium: 138 mmol/L (ref 135–145)

## 2015-06-17 LAB — DIFFERENTIAL
BASOS ABS: 0 10*3/uL (ref 0.0–0.1)
Basophils Relative: 0 % (ref 0–1)
EOS ABS: 0.2 10*3/uL (ref 0.0–0.7)
Eosinophils Relative: 3 % (ref 0–5)
LYMPHS ABS: 4.6 10*3/uL — AB (ref 0.7–4.0)
LYMPHS PCT: 47 % — AB (ref 12–46)
MONO ABS: 0.6 10*3/uL (ref 0.1–1.0)
Monocytes Relative: 6 % (ref 3–12)
NEUTROS ABS: 4.3 10*3/uL (ref 1.7–7.7)
Neutrophils Relative %: 44 % (ref 43–77)

## 2015-06-17 LAB — TSH: TSH: 0.963 u[IU]/mL (ref 0.350–4.500)

## 2015-06-17 LAB — MRSA PCR SCREENING: MRSA by PCR: NEGATIVE

## 2015-06-17 LAB — PROTIME-INR
INR: 1.16 (ref 0.00–1.49)
Prothrombin Time: 15 seconds (ref 11.6–15.2)

## 2015-06-17 LAB — CBC
HEMATOCRIT: 38.5 % (ref 36.0–46.0)
HEMATOCRIT: 39 % (ref 36.0–46.0)
Hemoglobin: 12.5 g/dL (ref 12.0–15.0)
Hemoglobin: 12.9 g/dL (ref 12.0–15.0)
MCH: 29.1 pg (ref 26.0–34.0)
MCH: 29.7 pg (ref 26.0–34.0)
MCHC: 32.5 g/dL (ref 30.0–36.0)
MCHC: 33.1 g/dL (ref 30.0–36.0)
MCV: 89.7 fL (ref 78.0–100.0)
MCV: 89.7 fL (ref 78.0–100.0)
PLATELETS: 189 10*3/uL (ref 150–400)
Platelets: 190 10*3/uL (ref 150–400)
RBC: 4.29 MIL/uL (ref 3.87–5.11)
RBC: 4.35 MIL/uL (ref 3.87–5.11)
RDW: 14.3 % (ref 11.5–15.5)
RDW: 14.4 % (ref 11.5–15.5)
WBC: 7.2 10*3/uL (ref 4.0–10.5)
WBC: 9.8 10*3/uL (ref 4.0–10.5)

## 2015-06-17 LAB — POCT ACTIVATED CLOTTING TIME: Activated Clotting Time: 583 seconds

## 2015-06-17 LAB — TROPONIN I
TROPONIN I: 12.04 ng/mL — AB (ref <0.031)
TROPONIN I: 10.13 ng/mL — AB (ref <0.031)
Troponin I: 0.03 ng/mL (ref <0.031)
Troponin I: 11.29 ng/mL (ref <0.031)

## 2015-06-17 LAB — LIPID PANEL
Cholesterol: 248 mg/dL — ABNORMAL HIGH (ref 0–200)
HDL: 45 mg/dL (ref >40)
LDL Cholesterol: 171 mg/dL — ABNORMAL HIGH (ref 0–99)
Total CHOL/HDL Ratio: 5.5 RATIO
Triglycerides: 162 mg/dL — ABNORMAL HIGH (ref <150)
VLDL: 32 mg/dL (ref 0–40)

## 2015-06-17 LAB — APTT

## 2015-06-17 SURGERY — LEFT HEART CATH AND CORONARY ANGIOGRAPHY
Anesthesia: LOCAL

## 2015-06-17 MED ORDER — VERAPAMIL HCL 2.5 MG/ML IV SOLN
INTRAVENOUS | Status: AC
  Filled 2015-06-17: qty 2

## 2015-06-17 MED ORDER — NITROGLYCERIN 1 MG/10 ML FOR IR/CATH LAB
INTRA_ARTERIAL | Status: AC
  Filled 2015-06-17: qty 10

## 2015-06-17 MED ORDER — HEPARIN SODIUM (PORCINE) 1000 UNIT/ML IJ SOLN
INTRAMUSCULAR | Status: DC | PRN
  Administered 2015-06-17: 4000 [IU] via INTRAVENOUS

## 2015-06-17 MED ORDER — METOPROLOL TARTRATE 25 MG PO TABS
25.0000 mg | ORAL_TABLET | Freq: Two times a day (BID) | ORAL | Status: DC
  Administered 2015-06-17 – 2015-06-18 (×4): 25 mg via ORAL
  Filled 2015-06-17 (×4): qty 1

## 2015-06-17 MED ORDER — SODIUM CHLORIDE 0.9 % IJ SOLN
3.0000 mL | Freq: Two times a day (BID) | INTRAMUSCULAR | Status: DC
  Administered 2015-06-17 – 2015-06-18 (×4): 3 mL via INTRAVENOUS

## 2015-06-17 MED ORDER — NITROGLYCERIN 0.4 MG SL SUBL
0.4000 mg | SUBLINGUAL_TABLET | SUBLINGUAL | Status: DC | PRN
  Administered 2015-06-17 – 2015-06-19 (×3): 0.4 mg via SUBLINGUAL
  Filled 2015-06-17: qty 1

## 2015-06-17 MED ORDER — MIDAZOLAM HCL 2 MG/2ML IJ SOLN
INTRAMUSCULAR | Status: AC
  Filled 2015-06-17: qty 4

## 2015-06-17 MED ORDER — VERAPAMIL HCL 2.5 MG/ML IV SOLN
INTRAVENOUS | Status: DC | PRN
  Administered 2015-06-17: 01:00:00 via INTRA_ARTERIAL

## 2015-06-17 MED ORDER — TICAGRELOR 90 MG PO TABS
90.0000 mg | ORAL_TABLET | Freq: Two times a day (BID) | ORAL | Status: DC
  Administered 2015-06-17 – 2015-06-19 (×5): 90 mg via ORAL
  Filled 2015-06-17 (×5): qty 1

## 2015-06-17 MED ORDER — BIVALIRUDIN 250 MG IV SOLR
INTRAVENOUS | Status: AC
  Filled 2015-06-17: qty 250

## 2015-06-17 MED ORDER — FENTANYL CITRATE (PF) 100 MCG/2ML IJ SOLN
INTRAMUSCULAR | Status: AC
  Filled 2015-06-17: qty 4

## 2015-06-17 MED ORDER — SODIUM CHLORIDE 0.9 % IV SOLN: 250.0000 mL | INTRAVENOUS | Status: DC | PRN

## 2015-06-17 MED ORDER — BIVALIRUDIN BOLUS VIA INFUSION - CUPID
INTRAVENOUS | Status: DC | PRN
  Administered 2015-06-17: 67.875 mg via INTRAVENOUS

## 2015-06-17 MED ORDER — TICAGRELOR 90 MG PO TABS
ORAL_TABLET | ORAL | Status: AC
  Filled 2015-06-17: qty 1

## 2015-06-17 MED ORDER — HEPARIN (PORCINE) IN NACL 100-0.45 UNIT/ML-% IJ SOLN
950.0000 [IU]/kg/h | INTRAMUSCULAR | Status: DC
  Filled 2015-06-17 (×56): qty 250

## 2015-06-17 MED ORDER — NITROGLYCERIN 1 MG/10 ML FOR IR/CATH LAB
INTRA_ARTERIAL | Status: DC | PRN
  Administered 2015-06-17 (×4): 200 ug via INTRACORONARY

## 2015-06-17 MED ORDER — HEPARIN SODIUM (PORCINE) 1000 UNIT/ML IJ SOLN
INTRAMUSCULAR | Status: AC
  Filled 2015-06-17: qty 1

## 2015-06-17 MED ORDER — MIDAZOLAM HCL 2 MG/2ML IJ SOLN
INTRAMUSCULAR | Status: DC | PRN
  Administered 2015-06-17 (×2): 1 mg via INTRAVENOUS

## 2015-06-17 MED ORDER — SODIUM CHLORIDE 0.9 % IV SOLN
INTRAVENOUS | Status: DC
  Administered 2015-06-17: 03:00:00 via INTRAVENOUS

## 2015-06-17 MED ORDER — NITROGLYCERIN 0.4 MG SL SUBL
SUBLINGUAL_TABLET | SUBLINGUAL | Status: AC
  Filled 2015-06-17: qty 1

## 2015-06-17 MED ORDER — SODIUM CHLORIDE 0.9 % IJ SOLN: 3.0000 mL | Freq: Two times a day (BID) | INTRAMUSCULAR | Status: DC

## 2015-06-17 MED ORDER — HEPARIN (PORCINE) IN NACL 100-0.45 UNIT/ML-% IJ SOLN
950.0000 [IU]/h | INTRAMUSCULAR | Status: DC
  Filled 2015-06-17: qty 250

## 2015-06-17 MED ORDER — MORPHINE SULFATE 4 MG/ML IJ SOLN
4.0000 mg | Freq: Once | INTRAMUSCULAR | Status: AC
  Administered 2015-06-17: 4 mg via INTRAVENOUS

## 2015-06-17 MED ORDER — MORPHINE SULFATE 4 MG/ML IJ SOLN
INTRAMUSCULAR | Status: AC
  Filled 2015-06-17: qty 1

## 2015-06-17 MED ORDER — HEPARIN BOLUS VIA INFUSION
4000.0000 [IU] | Freq: Once | INTRAVENOUS | Status: AC
  Administered 2015-06-17: 4000 [IU] via INTRAVENOUS
  Filled 2015-06-17: qty 4000

## 2015-06-17 MED ORDER — MORPHINE SULFATE 2 MG/ML IJ SOLN
2.0000 mg | INTRAMUSCULAR | Status: DC | PRN
  Filled 2015-06-17: qty 1

## 2015-06-17 MED ORDER — TICAGRELOR 90 MG PO TABS
ORAL_TABLET | ORAL | Status: DC | PRN
  Administered 2015-06-17: 180 mg via ORAL

## 2015-06-17 MED ORDER — IOHEXOL 350 MG/ML SOLN
INTRAVENOUS | Status: DC | PRN
  Administered 2015-06-17: 150 mL via INTRA_ARTERIAL

## 2015-06-17 MED ORDER — SODIUM CHLORIDE 0.9 % IJ SOLN: 3.0000 mL | INTRAMUSCULAR | Status: DC | PRN

## 2015-06-17 MED ORDER — SODIUM CHLORIDE 0.9 % IV SOLN
250.0000 mg | INTRAVENOUS | Status: DC | PRN
  Administered 2015-06-17: 1.75 mg/kg/h via INTRAVENOUS

## 2015-06-17 MED ORDER — ASPIRIN 81 MG PO CHEW
81.0000 mg | CHEWABLE_TABLET | Freq: Every day | ORAL | Status: DC
  Administered 2015-06-17 – 2015-06-19 (×3): 81 mg via ORAL
  Filled 2015-06-17 (×3): qty 1

## 2015-06-17 MED ORDER — ATORVASTATIN CALCIUM 80 MG PO TABS
80.0000 mg | ORAL_TABLET | Freq: Every day | ORAL | Status: DC
  Administered 2015-06-17 – 2015-06-18 (×2): 80 mg via ORAL
  Filled 2015-06-17 (×2): qty 1

## 2015-06-17 MED ORDER — HEPARIN SODIUM (PORCINE) 5000 UNIT/ML IJ SOLN: 60.0000 [IU]/kg | Freq: Once | INTRAMUSCULAR | Status: DC

## 2015-06-17 MED ORDER — FENTANYL CITRATE (PF) 100 MCG/2ML IJ SOLN
INTRAMUSCULAR | Status: DC | PRN
  Administered 2015-06-17 (×2): 25 ug via INTRAVENOUS

## 2015-06-17 SURGICAL SUPPLY — 24 items
BALLN EMERGE MR 2.5X12 (BALLOONS) ×3
BALLN ~~LOC~~ EMERGE MR 2.75X12 (BALLOONS) ×3
BALLOON EMERGE MR 2.5X12 (BALLOONS) IMPLANT
BALLOON ~~LOC~~ EMERGE MR 2.75X12 (BALLOONS) IMPLANT
CATH INFINITI 5 FR JL3.5 (CATHETERS) ×3 IMPLANT
CATH INFINITI 5FR ANG PIGTAIL (CATHETERS) ×3 IMPLANT
CATH INFINITI JR4 5F (CATHETERS) ×3 IMPLANT
DEVICE RAD COMP TR BAND LRG (VASCULAR PRODUCTS) ×3 IMPLANT
GLIDESHEATH SLEND SS 6F .021 (SHEATH) ×3 IMPLANT
GUIDE CATH RUNWAY 6FR CLS3 (CATHETERS) ×1 IMPLANT
GUIDE CATH RUNWAY 6FR FR4 (CATHETERS) ×1 IMPLANT
KIT ENCORE 26 ADVANTAGE (KITS) ×1 IMPLANT
KIT HEART LEFT (KITS) ×3 IMPLANT
PACK CARDIAC CATHETERIZATION (CUSTOM PROCEDURE TRAY) ×3 IMPLANT
STENT MINI VISION RX 2.5X15 (Permanent Stent) ×1 IMPLANT
STENT MINI VISION RX 2.5X23 (Permanent Stent) ×1 IMPLANT
SYR MEDRAD MARK V 150ML (SYRINGE) ×3 IMPLANT
TRANSDUCER W/STOPCOCK (MISCELLANEOUS) ×3 IMPLANT
TUBING CIL FLEX 10 FLL-RA (TUBING) ×3 IMPLANT
VALVE GUARDIAN II ~~LOC~~ HEMO (MISCELLANEOUS) ×1 IMPLANT
WIRE ASAHI PROWATER 180CM (WIRE) ×1 IMPLANT
WIRE HI TORQ BMW 190CM (WIRE) ×1 IMPLANT
WIRE HI TORQ VERSACORE-J 145CM (WIRE) ×1 IMPLANT
WIRE SAFE-T 1.5MM-J .035X260CM (WIRE) ×1 IMPLANT

## 2015-06-17 NOTE — Care Management Note (Addendum)
Case Management Note  Patient Details  Name: Virginia [name] MRN: [id number] Date of Birth: 09/01/1961  Subjective/Objective:   54 y.o. F admitted from home where she lives with spouse in private residence. Admitted for STEMI and underwent Cardiac Cath with Emergent PCI. CM consult for Medication assist noted.             Action/Plan: Will follow for discharge/medication needs.      Expected Discharge Date:                  Expected Discharge Plan:     In-House Referral:     Discharge planning Services  CM Consult  Post Acute Care Choice:    Choice offered to:     DME Arranged:    DME Agency:     HH Arranged:    HH Agency:     Status of Service:  In process, will continue to follow  Medicare Important Message Given:    Date Medicare IM Given:    Medicare IM give by:    Date Additional Medicare IM Given:    Additional Medicare Important Message give by:     If discussed at [place] of Stay Meetings, dates discussed:    Additional Comments:  Delrae Sawyers, RN 06/17/2015, 7:30 AM

## 2015-06-17 NOTE — ED Notes (Signed)
CODE STEMI PAGED @0001 

## 2015-06-17 NOTE — Progress Notes (Signed)
Pt still having light CP that intermittently gets more intense "like a squeeze". Will hold ambulation today. Began ed with MI, stent, Brilinta. Voiced understanding. Gave pt diet sheet to begin reading. Will f/u tomorrow. [place], ACSM 1:49 PM 06/17/2015

## 2015-06-17 NOTE — H&P (Signed)
Patient Name: Virginia [name] Date of Encounter: 06/17/2015  Principal Problem:   Acute MI, inferolateral wall, initial episode of care Active Problems:   STEMI (ST elevation myocardial infarction)   Hyperlipidemia with target LDL less than 74   Primary Cardiologist: Dr Irish Lack  Patient Profile: 54 yo female w/ no previous med hx, admitted 08/09 w/ STEMI, HL.  SUBJECTIVE: No chest pain, no SOB  OBJECTIVE Filed Vitals:   06/17/15 0500 06/17/15 0530 06/17/15 0600 06/17/15 0807  BP: 147/82 119/67 125/67 143/84  Pulse: 68 71 71 69  Temp: 97.8 F (36.6 C)   98.3 F (36.8 C)  TempSrc: Oral   Oral  Resp: 12 14 13 10   Height:      Weight:      SpO2: 100% 94% 96% 98%    Intake/Output Summary (Last 24 hours) at 06/17/15 5597 Last data filed at 06/17/15 0600  Gross per 24 hour  Intake 243.75 ml  Output    900 ml  Net -656.25 ml   Filed Weights   06/16/15 2359 06/17/15 0245  Weight: 180 lb (81.647 kg) 199 lb 8.3 oz (90.5 kg)    PHYSICAL EXAM General: Well developed, well nourished, female in no acute distress. Head: Normocephalic, atraumatic.  Neck: Supple without bruits, JVD not elevated Lungs:  Resp regular and unlabored, CTA. Heart: RRR, S1, S2, no S3, S4, or murmur; no rub. Abdomen: Soft, non-tender, non-distended, BS + x 4.  Extremities: No clubbing, cyanosis, edema. R radial cath site without ecchymosis or hematoma Neuro: Alert and oriented X 3. Moves all extremities spontaneously. Psych: Normal affect.  LABS: CBC:  Recent Labs  06/17/15 0013 06/17/15 0631  WBC 9.8 7.2  NEUTROABS 4.3  --   HGB 12.9 12.5  HCT 39.0 38.5  MCV 89.7 89.7  PLT 189 190   INR:  Recent Labs  06/17/15 0013  INR 4.16   Basic Metabolic Panel:  Recent Labs  06/17/15 0013 06/17/15 0631  NA 139 138  K 3.6 4.1  CL 104 104  CO2 23 25  GLUCOSE 180* 143*  BUN 12 10  CREATININE 1.29* 1.10*  CALCIUM 9.1 8.7*   Cardiac Enzymes:  Recent Labs  06/17/15 0013  06/17/15 0631  TROPONINI 0.03 11.29*   Fasting Lipid Panel:  Recent Labs  06/17/15 0631  CHOL 248*  HDL 45  LDLCALC 171*  TRIG 162*  CHOLHDL 5.5   Thyroid Function Tests:  Recent Labs  06/17/15 0631  TSH 0.963    TELE:   SR, occ PVCs     Radiology/Studies: No results found.   Current Medications:  . aspirin  81 mg Oral Daily  . atorvastatin  80 mg Oral q1800  . metoprolol tartrate  25 mg Oral BID  . sodium chloride  3 mL Intravenous Q12H  . ticagrelor  90 mg Oral BID      ASSESSMENT AND PLAN: Principal Problem:   Acute MI, inferolateral wall, initial episode of care - s/p BMS x 2 - non-obs dz otherwise, EF nl - on ASA, BB, statin  Active Problems:   STEMI (ST elevation myocardial infarction) - see above    Hyperlipidemia with target LDL less than 70 - high-dose statin  Plan- cardiac rehab to see, tx stepdown, possible d/c Friday if does well. Education is key  Signed, Lenoard Aden 8:32 AM 06/17/2015   Agree with note by Rosaria Ferries PA-C  S/P inferolateral wall MI/STEMI Rx with PCI/Stent LCX with overlapping  BMSs. Other probs as outlined. NO further CP. Exam benign. Labs OK. On approp meds. OK to Tx to stepdown today and tele tomorrow. Prob can D/C home on Fri.  Lorretta Harp, M.D., [place], [facility], [place] [address] [place], [place]  [id number]  [phone] 06/17/2015 1:27 PM

## 2015-06-17 NOTE — CV Procedure (Signed)
    Cath with PCI performed.  Overlapping BMS to the occluded circumflex which was the culprit lesion.  Hand written note in chart since EPIC was not working at the time of the cath.  Full report in [facility] to follow.    Jettie Booze, MD

## 2015-06-17 NOTE — Progress Notes (Signed)
ANTICOAGULATION CONSULT NOTE - Initial Consult  Pharmacy Consult for heparin Indication: code STEMI  No Known Allergies  Patient Measurements: Height: 5\' 6"  (167.6 cm) Weight: 180 lb (81.647 kg) IBW/kg (Calculated) : 59.3 Heparin Dosing Weight: 76.4 kg  Vital Signs: Temp: 98.6 F (37 C) (08/09 2359) Temp Source: Oral (08/09 2359) BP: 167/89 mmHg (08/10 0005) Pulse Rate: 44 (08/09 2359)  Labs: No results for input(s): HGB, HCT, PLT, APTT, LABPROT, INR, HEPARINUNFRC, CREATININE, CKTOTAL, CKMB, TROPONINI in the last 72 hours.  CrCl cannot be calculated (Patient has no serum creatinine result on file.).   Medical History: History reviewed. No pertinent past medical history.   Assessment:  64y F with CP, code STEMI called 0001.  Pharmacy to dose heparin.  HDW 76.4 kg, H/H OK, plct low at 146K. Creat 0.9.  Goal of Therapy:  Heparin level 0.3-0.7 units/ml Monitor platelets by anticoagulation protocol: Yes   Plan:  Give 4000 units bolus x 1 Start heparin infusion at 950 units/hr Check anti-Xa level in 6 hours and daily while on heparin Continue to monitor H&H and platelets   Eudelia Bunch, Pharm.D. [phone] 06/17/2015 12:15 AM

## 2015-06-17 NOTE — Progress Notes (Signed)
Order for transfer acknowledged, pt informed and verbalized understanding. Pt still having chest discomfort. Rounding PA/ NP paged. Awaiting response. Report called to receiving RN and informed of intermittent chest discomfort. Belongings packed. Pt transferred to [facility] with belongings and accompanied by family.  P.S. Informed R. Barrett on floor while rounding of pt's chest discomfort. Stated will address. Will cont to monitor.

## 2015-06-18 ENCOUNTER — Ambulatory Visit (HOSPITAL_COMMUNITY): Payer: Self-pay | Attending: Internal Medicine

## 2015-06-18 ENCOUNTER — Encounter (HOSPITAL_COMMUNITY): Payer: Self-pay | Admitting: Interventional Cardiology

## 2015-06-18 DIAGNOSIS — I340 Nonrheumatic mitral (valve) insufficiency: Secondary | ICD-10-CM | POA: Insufficient documentation

## 2015-06-18 DIAGNOSIS — I2119 ST elevation (STEMI) myocardial infarction involving other coronary artery of inferior wall: Principal | ICD-10-CM

## 2015-06-18 DIAGNOSIS — R079 Chest pain, unspecified: Secondary | ICD-10-CM

## 2015-06-18 LAB — HEMOGLOBIN A1C
HEMOGLOBIN A1C: 6.6 % — AB (ref 4.8–5.6)
MEAN PLASMA GLUCOSE: 143 mg/dL

## 2015-06-18 LAB — TROPONIN I
TROPONIN I: 4.75 ng/mL — AB (ref <0.031)
Troponin I: 6.28 ng/mL (ref <0.031)
Troponin I: 3.75 ng/mL (ref <0.031)
Troponin I: 5.12 ng/mL (ref <0.031)

## 2015-06-18 LAB — HEPATIC FUNCTION PANEL
ALK PHOS: 86 U/L (ref 38–126)
ALT: 20 U/L (ref 14–54)
AST: 50 U/L — ABNORMAL HIGH (ref 15–41)
Albumin: 3.4 g/dL — ABNORMAL LOW (ref 3.5–5.0)
BILIRUBIN DIRECT: 0.1 mg/dL (ref 0.1–0.5)
BILIRUBIN INDIRECT: 0.6 mg/dL (ref 0.3–0.9)
TOTAL PROTEIN: 6.9 g/dL (ref 6.5–8.1)
Total Bilirubin: 0.7 mg/dL (ref 0.3–1.2)

## 2015-06-18 LAB — BASIC METABOLIC PANEL
Anion gap: 9 (ref 5–15)
BUN: 14 mg/dL (ref 6–20)
CALCIUM: 8.7 mg/dL — AB (ref 8.9–10.3)
CO2: 21 mmol/L — AB (ref 22–32)
CREATININE: 0.98 mg/dL (ref 0.44–1.00)
Chloride: 106 mmol/L (ref 101–111)
GFR calc Af Amer: >60 mL/min (ref >60)
Glucose, Bld: 118 mg/dL — ABNORMAL HIGH (ref 65–99)
Potassium: 4 mmol/L (ref 3.5–5.1)
SODIUM: 136 mmol/L (ref 135–145)

## 2015-06-18 MED ORDER — DIPHENHYDRAMINE HCL 25 MG PO CAPS
25.0000 mg | ORAL_CAPSULE | Freq: Four times a day (QID) | ORAL | Status: DC | PRN
  Administered 2015-06-18 – 2015-06-19 (×2): 25 mg via ORAL
  Filled 2015-06-18 (×2): qty 1

## 2015-06-18 MED ORDER — LISINOPRIL 2.5 MG PO TABS
2.5000 mg | ORAL_TABLET | Freq: Every day | ORAL | Status: DC
  Administered 2015-06-18: 2.5 mg via ORAL
  Filled 2015-06-18: qty 1

## 2015-06-18 NOTE — Research (Signed)
Dal-GenE Informed Consent   Subject Name: KHAMILA BASSINGER  Subject met inclusion and exclusion criteria.  The informed consent form, study requirements and expectations were reviewed with the subject and questions and concerns were addressed prior to the signing of the consent form.  The subject verbalized understanding of the trail requirements.  The subject agreed to participate in the dal-GenE trial and signed the informed consent.  The informed consent was obtained prior to performance of any protocol-specific procedures for the subject.  A copy of the signed informed consent was given to the subject and a copy was placed in the subject's medical record.  Hedrick,Tammy W 06/18/2015, 11:10 AM

## 2015-06-18 NOTE — Progress Notes (Signed)
MD notified of new itching rash at pt R wrist. Will continue to monitor closely and await new orders.

## 2015-06-18 NOTE — Progress Notes (Signed)
CARDIAC REHAB PHASE I   PRE:  Rate/Rhythm: 71 SR    BP: sitting 139/80    SaO2:   MODE:  Ambulation: 350 ft   POST:  Rate/Rhythm: 80 SR    BP: sitting 147/86     SaO2:   Pt sts her CP is gone today and she feels well. Able to walk without problems. Gave stent card. Will f/u in am for rest of education as she is getting her blood drawn now. [id number]   Josephina Shih Jackson CES, ACSM 06/18/2015 12:11 PM

## 2015-06-18 NOTE — Care Management Note (Signed)
Case Management Note  Patient Details  Name: Virginia [name] MRN: [id number] Date of Birth: 1961-05-04  Subjective/Objective:    Pt to d/c on Brilinta, has card for 30-day free trial.  Provided pt with telephone number for pharmaceutical patient assistance for continued help with Brilinta.  Lisinopril and metoprolol are on [employer] $4 list, provided pt with pharmaceutical patient assistance program information for Lipitor.  If she qualifies, a 30-day supply would cost $30 and pt indicates she can manage that.  Pt will qualify for [facility] program and can get 34-day supply of medications for $3 per script.                               Expected Discharge Plan:  Home/Self Care  In-House Referral:  Financial Counselor  Discharge planning Services  CM Consult, [place] Program, Medication Assistance  Status of Service:  In process, will continue to follow  Girard Cooter, RN 06/18/2015, 2:44 PM

## 2015-06-18 NOTE — Clinical Documentation Improvement (Signed)
Please clarify and document if the "proximal edge dissection" documented in the dictated cath lab report is:   - A complication of the stent procedure  - Not a complication of the stent procedure  - Unable to clinically determine  Clinical Information: "There appeared to be a proximal edge dissection. It was unclear whether this was spasm. It did not respond to nitroglycerin. Therefore, a second stent was placed in overlapping fashion proximally" is documented in the dictated procedure note dated 06/17/15 by Dr. Irish Lack.   Erling Conte  RN BSN CCDS Clinical Documentation Specialist [phone] Health Information Management [place]

## 2015-06-18 NOTE — Progress Notes (Signed)
  Echocardiogram 2D Echocardiogram has been performed.  Jennette Dubin 06/18/2015, 3:18 PM

## 2015-06-18 NOTE — Progress Notes (Signed)
    Subjective: No CP or SOB  Objective: Vital signs in last 24 hours: Temp:  [97.7 F (36.5 C)-98.6 F (37 C)] 98 F (36.7 C) (08/11 0449) Pulse Rate:  [69-79] 73 (08/10 1634) Resp:  [10-18] 17 (08/10 1634) BP: (121-158)/(69-98) 129/80 mmHg (08/11 0451) SpO2:  [97 %-99 %] 97 % (08/11 0449) Last BM Date: 06/16/15  Intake/Output from previous day: 08/10 0701 - 08/11 0700 In: 209 [P.O.:840; I.V.:3] Out: 400 [Urine:400] Intake/Output this shift:    Medications Scheduled Meds: . aspirin  81 mg Oral Daily  . atorvastatin  80 mg Oral q1800  . metoprolol tartrate  25 mg Oral BID  . sodium chloride  3 mL Intravenous Q12H  . ticagrelor  90 mg Oral BID   Continuous Infusions:  PRN Meds:.sodium chloride, morphine injection, nitroGLYCERIN, sodium chloride  PE: General appearance: alert, cooperative and no distress Lungs: clear to auscultation bilaterally Heart: regular rate and rhythm, S1, S2 normal, no murmur, click, rub or gallop Extremities: No LEE Pulses: 2+ and symmetric Skin: Warm and dry.  Right wrist cath site stable.  + ecchymosis Neurologic: Grossly normal  Lab Results:   Recent Labs  06/17/15 0013 06/17/15 0107 06/17/15 0631  WBC 9.8  --  7.2  HGB 12.9 13.6 12.5  HCT 39.0 40.0 38.5  PLT 189  --  190   BMET  Recent Labs  06/17/15 0013 06/17/15 0107 06/17/15 0631 06/18/15 0428  NA 139 139 138 136  K 3.6 3.2* 4.1 4.0  CL 104 102 104 106  CO2 23  --  25 21*  GLUCOSE 180* 188* 143* 118*  BUN 12 11 10 14   CREATININE 1.29* 1.00 1.10* 0.98  CALCIUM 9.1  --  8.7* 8.7*   PT/INR  Recent Labs  06/17/15 0013  LABPROT 15.0  INR 1.16   Cholesterol  Recent Labs  06/17/15 0631  CHOL 248*   Lipid Panel     Component Value Date/Time   CHOL 248* 06/17/2015 0631   TRIG 162* 06/17/2015 0631   HDL 45 06/17/2015 0631   CHOLHDL 5.5 06/17/2015 0631   VLDL 32 06/17/2015 0631   LDLCALC 171* 06/17/2015 0631       Assessment/Plan  Acute MI,  inferolateral wall, initial episode of care - DAy two. s/p PCI/Stent LCX with overlapping BMSs - non-obs dz otherwise, EF nl - on ASA, Brilinta, BB, statin,  Adding ACE-I   STEMI (ST elevation myocardial infarction)   see above   Hyperlipidemia with target LDL less than 70  high-dose statin  Transfer to telemetry,  Cardiac rehab   LOS: 1 day    HAGER, BRYAN PA-C 06/18/2015 7:54 AM   Agree with note written by Luisa Dago PAC  Looks good. Day # 2 LCX infarct. No CP. Exam benign. Labs OK. Tx tele. [facility]. Prob home AM.  Quay Burow 06/18/2015 8:19 AM

## 2015-06-19 LAB — BASIC METABOLIC PANEL
ANION GAP: 8 (ref 5–15)
BUN: 14 mg/dL (ref 6–20)
CHLORIDE: 104 mmol/L (ref 101–111)
CO2: 25 mmol/L (ref 22–32)
Calcium: 8.6 mg/dL — ABNORMAL LOW (ref 8.9–10.3)
Creatinine, Ser: 1.05 mg/dL — ABNORMAL HIGH (ref 0.44–1.00)
GFR calc Af Amer: >60 mL/min (ref >60)
GFR, EST NON AFRICAN AMERICAN: 59 mL/min — AB (ref >60)
Glucose, Bld: 116 mg/dL — ABNORMAL HIGH (ref 65–99)
Potassium: 3.9 mmol/L (ref 3.5–5.1)
Sodium: 137 mmol/L (ref 135–145)

## 2015-06-19 LAB — TROPONIN I
Troponin I: 4.61 ng/mL (ref <0.031)
Troponin I: 3.59 ng/mL (ref <0.031)
Troponin I: 3.06 ng/mL (ref <0.031)

## 2015-06-19 MED ORDER — TICAGRELOR 90 MG PO TABS: 90.0000 mg | ORAL_TABLET | Freq: Two times a day (BID) | ORAL | Status: DC

## 2015-06-19 MED ORDER — METOPROLOL TARTRATE 25 MG PO TABS: 25.0000 mg | ORAL_TABLET | Freq: Two times a day (BID) | ORAL | Status: DC

## 2015-06-19 MED ORDER — ACETAMINOPHEN 325 MG PO TABS: 650.0000 mg | ORAL_TABLET | Freq: Four times a day (QID) | ORAL | Status: DC | PRN

## 2015-06-19 MED ORDER — LISINOPRIL 2.5 MG PO TABS: 2.5000 mg | ORAL_TABLET | Freq: Every day | ORAL | Status: DC

## 2015-06-19 MED ORDER — ATORVASTATIN CALCIUM 80 MG PO TABS: 80.0000 mg | ORAL_TABLET | Freq: Every day | ORAL | Status: DC

## 2015-06-19 MED ORDER — ASPIRIN 81 MG PO TABS: 81.0000 mg | ORAL_TABLET | Freq: Once | ORAL | Status: DC

## 2015-06-19 MED ORDER — NITROGLYCERIN 0.4 MG SL SUBL: 0.4000 mg | SUBLINGUAL_TABLET | SUBLINGUAL | Status: DC | PRN

## 2015-06-19 NOTE — Care Management Note (Signed)
Case Management Note  Patient Details  Name: Virginia [name] MRN: [id number] Date of Birth: 04/10/1961  Subjective/Objective:  Pt admitted for [facility].                   Action/Plan: Previous CM provided 30 day free card for Brilinta. This CM did make pt aware to call the Support Number on back of brochure. CM did call [facility] to see if pt was appropriate for services. [facility] CM was able to set pt up with hospital f/u appointment at the [facility]. [facility] CM called the [facility] to see if pharmacy could be utilized and it will be able to. Medications will cost up to $4-$10. Cardiology PA to send Rx's including 30 day free brilinta to [facility] and pt is aware to pick them up. [place] will assist the pt with the PASS program for medication assistance. No further needs identified by CM at this time.    Expected Discharge Date:                  Expected Discharge Plan:  Home/Self Care  In-House Referral:  Financial Counselor  Discharge planning Services  CM Consult, [place] Program, Medication Assistance  Post Acute Care Choice:  NA Choice offered to:  NA  DME Arranged:  N/A DME Agency:  NA  HH Arranged:  NA HH Agency:     Status of Service:  Completed, signed off  Medicare Important Message Given:    Date Medicare IM Given:    Medicare IM give by:    Date Additional Medicare IM Given:    Additional Medicare Important Message give by:     If discussed at [place] of Stay Meetings, dates discussed:    Additional Comments:  Bethena Roys, RN 06/19/2015, 12:08 PM

## 2015-06-19 NOTE — Discharge Summary (Signed)
Physician Discharge Summary  Patient ID: ARISBETH PURRINGTON MRN: [id number] DOB/AGE: 1961/10/26 54 y.o.  Admit date: 06/16/2015 Discharge date: 06/19/2015  Admission Diagnoses:  Discharge Diagnoses:  Principal Problem:   Acute MI, inferolateral wall, initial episode of care Active Problems:   STEMI (ST elevation myocardial infarction)   Hyperlipidemia with target LDL less than 70   Discharged Condition: good  Hospital Course: Virginia [name] is a 54 y.o. female with no significant PMH who presented to the ED via EMS on 06/16/2015 for chest pain that had started earlier that evening. She did not have any associated nausea, vomiting, shortness of breath, diaphoresis, or radiating pain. EKG showed ST elevation in inferior and anterolateral leads and Code STEMI was activated.  She was taken emergently to the cath lab. Her mid Cx was 100% stenosed with a 0% residual stenosis post intervention. Two overlapping bare metal stents were placed due to there being a proximal edge dissection. It was unclear whether this was spasm therefore a second stent was placed. Bare metal stents were utilized due the patient admitting she had issues obtaining medications. Dual antiplatelet therapy for at least 30 days was recommended.  There were no post-cath complications with the right radial cath site being without ecchymosis or hematoma. A lipid panel was checked on 06/17/15 and LDL was found to be elevated to 171. Atorvastatin 80mg  daily was started, in addition to ASA 81mg , Lopressor 25mg  BID, and Brilinta 90mg  BID.  On 06/18/15, Lisinopril 2.5mg  daily was started. Her BP tolerated the medication well. She also worked with cardiac rehab on 06/18/15 and walked 354ft without any chest pain or shortness of breath.  On 06/19/15 she did have left-sided chest pain which was radiating to her back and changing intensity with different positions. She noted the pain was different from what brought her into the hospital. She was  given SL NTG and that helped with the pain. She was able to ambulate 514ft with cardiac rehab without any increasing chest pain.  The patient was last examined by Dr. Gwenlyn Found and deemed stable for discharge. Will have close cardiology follow-up with appointment already scheduled on 07/01/15.   Consults: Cardiac Rehab  Significant Diagnostic Studies: Echocardiogram, Left Heart Cath, Coronary Angiography, Coronary Stent Intervention  Cardiac Catheterization: 06/17/15  Mid Cx lesion, 100% stenosed. There is a 0% residual stenosis post intervention. Two overlapping bare metal stents were placed.  The left ventricular systolic function is normal.  We chose a bare metal stent due to the fact that the patient admitted that she has issues obtaining medications. She has not been taking any medicines regularly. We should be able to get her 30 days worth of Brilinta before leaving the hospital and if she did not continue it for longer than 30 days, she should still avoid stent thrombosis.  Recommended dual antiplatelet therapy for at least 30 days.  Echo: 06/18/15 Study Conclusions - Left ventricle: The cavity size was normal. Wall thickness was normal. Systolic function was normal. The estimated ejection fraction was in the range of 60% to 65%. Wall motion was normal; there were no regional wall motion abnormalities. - Mitral valve: There was mild regurgitation.  Disposition: 01-Home or Self Care      Discharge Instructions    Amb Referral to Cardiac Rehabilitation    Complete by:  As directed   Congestive Heart Failure: If diagnosis is Heart Failure, patient MUST meet each of the CMS criteria: 1. Left Ventricular Ejection Fraction </= 35% 2. NYHA class  II-IV symptoms despite being on optimal heart failure therapy for at least 6 weeks. 3. Stable = have not had a recent (<6 weeks) or planned (<6 months) major cardiovascular hospitalization or procedure  Program Details: - Physician  supervised classes - 1-3 classes per week over a 12-18 week period, generally for a total of 36 sessions  Physician Certification: I certify that the above Cardiac Rehabilitation treatment is medically necessary and is medically approved by me for treatment of this patient. The patient is willing and cooperative, able to ambulate and medically stable to participate in exercise rehabilitation. The participant's progress and Individualized Treatment Plan will be reviewed by the Medical Director, Cardiac Rehab staff and as indicated by the Referring/Ordering Physician.  Diagnosis:   Myocardial Infarction PCI              Medication List    TAKE these medications        aspirin 81 MG tablet  Take 1 tablet (81 mg total) by mouth once.     atorvastatin 80 MG tablet  Commonly known as:  LIPITOR  Take 1 tablet (80 mg total) by mouth daily at 6 PM.     lisinopril 2.5 MG tablet  Commonly known as:  PRINIVIL,ZESTRIL  Take 1 tablet (2.5 mg total) by mouth daily.     metoprolol tartrate 25 MG tablet  Commonly known as:  LOPRESSOR  Take 1 tablet (25 mg total) by mouth 2 (two) times daily.     nitroGLYCERIN 0.4 MG SL tablet  Commonly known as:  NITROSTAT  Place 1 tablet (0.4 mg total) under the tongue every 5 (five) minutes as needed for chest pain.     ticagrelor 90 MG Tabs tablet  Commonly known as:  BRILINTA  Take 1 tablet (90 mg total) by mouth 2 (two) times daily.     ticagrelor 90 MG Tabs tablet  Commonly known as:  BRILINTA  Take 1 tablet (90 mg total) by mouth 2 (two) times daily.       Follow-up Information    Follow up with [facility] On 06/23/2015.   Why:  Internal Medicine appointment on 06/23/15 at 11:30 am with Dr. Jarold Song.   Contact information:   [place] [address] [phone]      Follow up with [facility]    .   Why:  Please use the pharmacy for medications. Pharmacy to help  with [facility] for medications.    Contact information:   [address] [place] [address] [phone]      Follow up with Erlene Quan, PA-C On 07/01/2015.   Specialties:  Cardiology, Radiology   Why:  9:00 am (cardiology follow-up)    Contact information:   [address] [place] [address] [phone]       Follow up with Jettie Booze., MD On 07/24/2015.   Specialties:  Cardiology, Radiology, Interventional Cardiology   Why:  8:45 am (cardiology follow-up).    Contact information:   [address] [address] [phone]       More than 30 minutes spent on discharge, including physician time.   Signed: Dineen Kid, PA-C 06/19/2015, 12:17 PM

## 2015-06-19 NOTE — Progress Notes (Signed)
Patient Profile: 54 y/o female admitted for inferolateral wall STEMI  Subjective: With new left sided chest pain radiating to back. She notes this pain is different from her pain associated with her MI. She notes pain when she changes positions. No dyspnea.  Objective: Vital signs in last 24 hours: Temp:  [98 F (36.7 C)-98.8 F (37.1 C)] 98.5 F (36.9 C) (08/12 0413) Pulse Rate:  [72-83] 72 (08/12 0413) Resp:  [16-20] 16 (08/11 2056) BP: (120-156)/(69-86) 120/70 mmHg (08/12 0413) SpO2:  [95 %-100 %] 98 % (08/12 0413) Weight:  [196 lb 14.4 oz (89.313 kg)] 196 lb 14.4 oz (89.313 kg) (08/11 2056) Last BM Date: 06/18/15  Intake/Output from previous day: 08/11 0701 - 08/12 0700 In: 240 [P.O.:240] Out: -  Intake/Output this shift:    Medications Current Facility-Administered Medications  Medication Dose Route Frequency Provider Last Rate Last Dose  . 0.9 %  sodium chloride infusion  250 mL Intravenous PRN Alphia Moh, MD      . aspirin chewable tablet 81 mg  81 mg Oral Daily Alphia Moh, MD   81 mg at 06/18/15 1012  . atorvastatin (LIPITOR) tablet 80 mg  80 mg Oral q1800 Alphia Moh, MD   80 mg at 06/18/15 1706  . diphenhydrAMINE (BENADRYL) capsule 25 mg  25 mg Oral Q6H PRN Lamar Sprinkles, MD   25 mg at 06/18/15 2154  . lisinopril (PRINIVIL,ZESTRIL) tablet 2.5 mg  2.5 mg Oral Daily Lorretta Harp, MD   2.5 mg at 06/18/15 1210  . metoprolol tartrate (LOPRESSOR) tablet 25 mg  25 mg Oral BID Alphia Moh, MD   25 mg at 06/18/15 2113  . morphine 2 MG/ML injection 2-4 mg  2-4 mg Intravenous Q1H PRN Rhonda G Barrett, PA-C      . nitroGLYCERIN (NITROSTAT) SL tablet 0.4 mg  0.4 mg Sublingual Q5 min PRN Alphia Moh, MD   0.4 mg at 06/17/15 0516  . sodium chloride 0.9 % injection 3 mL  3 mL Intravenous PRN Alphia Moh, MD      . sodium chloride 0.9 % injection 3 mL  3 mL Intravenous Q12H Alphia Moh, MD   3 mL at 06/18/15 2200  . ticagrelor (BRILINTA)  tablet 90 mg  90 mg Oral BID Alphia Moh, MD   90 mg at 06/18/15 2113    PE: General appearance: alert, cooperative and no distress Neck: no carotid bruit and no JVD Lungs: clear to auscultation bilaterally Heart: regular rate and rhythm, S1, S2 normal, no murmur, click, rub or gallop Extremities: no LEE Pulses: 2+ and symmetric Skin: warm and dry Neurologic: Grossly normal  Lab Results:   Recent Labs  06/17/15 0013 06/17/15 0107 06/17/15 0631  WBC 9.8  --  7.2  HGB 12.9 13.6 12.5  HCT 39.0 40.0 38.5  PLT 189  --  190   BMET  Recent Labs  06/17/15 0631 06/18/15 0428 06/19/15 0534  NA 138 136 137  K 4.1 4.0 3.9  CL 104 106 104  CO2 25 21* 25  GLUCOSE 143* 118* 116*  BUN 10 14 14   CREATININE 1.10* 0.98 1.05*  CALCIUM 8.7* 8.7* 8.6*   PT/INR  Recent Labs  06/17/15 0013  LABPROT 15.0  INR 1.16   Cholesterol  Recent Labs  06/17/15 0631  CHOL 248*     Assessment/Plan  Principal Problem:   Acute MI, inferolateral wall, initial episode of care Active Problems:   STEMI (ST elevation myocardial infarction)   Hyperlipidemia with  target LDL less than 70   1. STEM: day 3 s/p PCI to LCx with overlapping BMS. Now with new left sided chest pain. Mild 2/6. Radiates to back. Patient notes it is somewhat different from MI pain. She notes its worse with changes in position. EKG however now shows diffuse TWIs. ? If evolutionary changes. Give PNR SL NTG. Will also add PRN tylenol in case musculoskeletal pain. Avoid NSAIDs given DAPT. Will ask CR to ambulate to see if any exertional chest pain. For now, continue ASA, Brilinta, BB, ACE and statin.    2. HLD: LDL elevated at 171. Goal is < 70 mg/dL. Continue high dose statin therapy. F/u FLP and LFTs in 6 weeks.   LOS: 2 days    Brittainy M. Ladoris Gene 06/19/2015 8:46 AM    Agree with note written by Ellen Henri  [facility]  OK for DC home. LCX MI Rx with BMS by Dr Irish Lack. DAPT. F/U with Dr. Saundra Shelling or MLP  2-3 weeks.  Quay Burow 06/19/2015 11:21 AM

## 2015-06-19 NOTE — Care Management (Signed)
This Case Manager received call from Jacqlyn Krauss, RN CM to determine if patient appropriate for [facility]. Patient does not meet criteria for [facility] but hospital follow-up appointment obtained on 06/23/15 at 1130 with Dr. Doreene Burke at [facility].  Hassan Rowan indicates patient to be discharged on Brilinta. She states patient will be given card for 30 day free trial of Brilinta. This Case Manager spoke with Karena Addison at [facility] who indicates patient will need to bring prescription and card for 30 day free trial of Brilinta to pharmacy to obtain medication. Patient will also be given an appointment with Juliann Pulse, Patient Assistance Coordinator, to determine if patient meets criteria for Brilinta patient assistance programs. Patient's appointment time placed on AVS.  Jacqlyn Krauss, RN CM updated.

## 2015-06-19 NOTE — Progress Notes (Signed)
CARDIAC REHAB PHASE I   PRE:  Rate/Rhythm: 81 SR    BP: sitting 123/48    SaO2:   MODE:  Ambulation: 550 ft   POST:  Rate/Rhythm: 103 ST    BP: sitting 101/76, 119/70 few minutes later     SaO2:   Pt in bed on O2. C/o 1-2/10 ache in center of chest into back that started early am. Sts it was 3/10 but has decreased with NTG and O2. Pt able to walk without increasing chest aching. Pt walked faster last 200 ft. Pt more SOB and had "hot flash" with decreased in BP. Chest aching still 1/10. To recliner. Pt relieved when she was told to go home. Ed completed. Pt Hgb A1C is 6.6 so encouraged PCP f/u which she needs to get. Interested in [facility] and will send referral to [facility]. Will need financial aid assistance.  [place], [place], ACSM 06/19/2015 12:04 PM

## 2015-06-23 ENCOUNTER — Ambulatory Visit (INDEPENDENT_AMBULATORY_CARE_PROVIDER_SITE_OTHER): Payer: Self-pay | Admitting: Internal Medicine

## 2015-06-23 ENCOUNTER — Encounter: Payer: Self-pay | Admitting: Internal Medicine

## 2015-06-23 VITALS — BP 116/72 | HR 68 | Temp 98.1°F | Resp 16 | Ht 66.0 in | Wt 193.0 lb

## 2015-06-23 DIAGNOSIS — E042 Nontoxic multinodular goiter: Secondary | ICD-10-CM

## 2015-06-23 DIAGNOSIS — Z09 Encounter for follow-up examination after completed treatment for conditions other than malignant neoplasm: Secondary | ICD-10-CM

## 2015-06-23 DIAGNOSIS — I252 Old myocardial infarction: Secondary | ICD-10-CM

## 2015-06-23 DIAGNOSIS — I213 ST elevation (STEMI) myocardial infarction of unspecified site: Secondary | ICD-10-CM

## 2015-06-23 DIAGNOSIS — E049 Nontoxic goiter, unspecified: Secondary | ICD-10-CM | POA: Insufficient documentation

## 2015-06-23 DIAGNOSIS — E119 Type 2 diabetes mellitus without complications: Secondary | ICD-10-CM | POA: Insufficient documentation

## 2015-06-23 MED ORDER — METFORMIN HCL 500 MG PO TABS: 500.0000 mg | ORAL_TABLET | Freq: Two times a day (BID) | ORAL | Status: DC

## 2015-06-23 MED ORDER — LISINOPRIL 2.5 MG PO TABS: 2.5000 mg | ORAL_TABLET | Freq: Every day | ORAL | Status: DC

## 2015-06-23 MED ORDER — TICAGRELOR 90 MG PO TABS: 90.0000 mg | ORAL_TABLET | Freq: Two times a day (BID) | ORAL | Status: DC

## 2015-06-23 MED ORDER — ASPIRIN 81 MG PO TABS: 81.0000 mg | ORAL_TABLET | Freq: Once | ORAL | Status: DC

## 2015-06-23 MED ORDER — METOPROLOL TARTRATE 25 MG PO TABS: 25.0000 mg | ORAL_TABLET | Freq: Two times a day (BID) | ORAL | Status: DC

## 2015-06-23 MED ORDER — ATORVASTATIN CALCIUM 80 MG PO TABS: 80.0000 mg | ORAL_TABLET | Freq: Every day | ORAL | Status: DC
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
## 2015-06-23 NOTE — Patient Instructions (Signed)
Basic Carbohydrate Counting for Diabetes Mellitus Carbohydrate counting is a method for keeping track of the amount of carbohydrates you eat. Eating carbohydrates naturally increases the level of sugar (glucose) in your blood, so it is important for you to know the amount that is okay for you to have in every meal. Carbohydrate counting helps keep the level of glucose in your blood within normal limits. The amount of carbohydrates allowed is different for every person. A dietitian can help you calculate the amount that is right for you. Once you know the amount of carbohydrates you can have, you can count the carbohydrates in the foods you want to eat. Carbohydrates are found in the following foods:  Grains, such as breads and cereals.  Dried beans and soy products.  Starchy vegetables, such as potatoes, peas, and corn.  Fruit and fruit juices.  Milk and yogurt.  Sweets and snack foods, such as cake, cookies, candy, chips, soft drinks, and fruit drinks. CARBOHYDRATE COUNTING There are two ways to count the carbohydrates in your food. You can use either of the methods or a combination of both. Reading the "Nutrition Facts" on Packaged Food The "Nutrition Facts" is an area that is included on the labels of almost all packaged food and beverages in the [place]. It includes the serving size of that food or beverage and information about the nutrients in each serving of the food, including the grams (g) of carbohydrate per serving.  Decide the number of servings of this food or beverage that you will be able to eat or drink. Multiply that number of servings by the number of grams of carbohydrate that is listed on the label for that serving. The total will be the amount of carbohydrates you will be having when you eat or drink this food or beverage. Learning Standard Serving Sizes of Food When you eat food that is not packaged or does not include "Nutrition Facts" on the label, you need to  measure the servings in order to count the amount of carbohydrates.A serving of most carbohydrate-rich foods contains about 15 g of carbohydrates. The following list includes serving sizes of carbohydrate-rich foods that provide 15 g ofcarbohydrate per serving:   1 slice of bread (1 oz) or 1 six-inch tortilla.    of a hamburger bun or English muffin.  4-6 crackers.   cup unsweetened dry cereal.    cup hot cereal.   cup rice or pasta.    cup mashed potatoes or  of a large baked potato.  1 cup fresh fruit or one small piece of fruit.    cup canned or frozen fruit or fruit juice.  1 cup milk.   cup plain fat-free yogurt or yogurt sweetened with artificial sweeteners.   cup cooked dried beans or starchy vegetable, such as peas, corn, or potatoes.  Decide the number of standard-size servings that you will eat. Multiply that number of servings by 15 (the grams of carbohydrates in that serving). For example, if you eat 2 cups of strawberries, you will have eaten 2 servings and 30 g of carbohydrates (2 servings x 15 g = 30 g). For foods such as soups and casseroles, in which more than one food is mixed in, you will need to count the carbohydrates in each food that is included. EXAMPLE OF CARBOHYDRATE COUNTING Sample Dinner  3 oz chicken breast.   cup of brown rice.   cup of corn.  1 cup milk.   1 cup strawberries with   sugar-free whipped topping.  Carbohydrate Calculation Step 1: Identify the foods that contain carbohydrates:   Rice.   Corn.   Milk.   Strawberries. Step 2:Calculate the number of servings eaten of each:   2 servings of rice.   1 serving of corn.   1 serving of milk.   1 serving of strawberries. Step 3: Multiply each of those number of servings by 15 g:   2 servings of rice x 15 g = 30 g.   1 serving of corn x 15 g = 15 g.   1 serving of milk x 15 g = 15 g.   1 serving of strawberries x 15 g = 15 g. Step 4: Add  together all of the amounts to find the total grams of carbohydrates eaten: 30 g + 15 g + 15 g + 15 g = 75 g. Document Released: 10/24/2005 Document Revised: 03/10/2014 Document Reviewed: 09/20/2013 [employer] Patient Information 2015 [employer]. This information is not intended to replace advice given to you by your health care provider. Make sure you discuss any questions you have with your health care provider. Diabetes and Exercise Exercising regularly is important. It is not just about losing weight. It has many health benefits, such as:  Improving your overall fitness, flexibility, and endurance.  Increasing your bone density.  Helping with weight control.  Decreasing your body fat.  Increasing your muscle strength.  Reducing stress and tension.  Improving your overall health. People with diabetes who exercise gain additional benefits because exercise:  Reduces appetite.  Improves the body's use of blood sugar (glucose).  Helps lower or control blood glucose.  Decreases blood pressure.  Helps control blood lipids (such as cholesterol and triglycerides).  Improves the body's use of the hormone insulin by:  Increasing the body's insulin sensitivity.  Reducing the body's insulin needs.  Decreases the risk for heart disease because exercising:  Lowers cholesterol and triglycerides levels.  Increases the levels of good cholesterol (such as high-density lipoproteins [HDL]) in the body.  Lowers blood glucose levels. YOUR ACTIVITY PLAN  Choose an activity that you enjoy and set realistic goals. Your health care provider or diabetes educator can help you make an activity plan that works for you. Exercise regularly as directed by your health care provider. This includes:  Performing resistance training twice a week such as push-ups, sit-ups, lifting weights, or using resistance bands.  Performing 150 minutes of cardio exercises each week such as walking, running, or  playing sports.  Staying active and spending no more than 90 minutes at one time being inactive. Even short bursts of exercise are good for you. Three 10-minute sessions spread throughout the day are just as beneficial as a single 30-minute session. Some exercise ideas include:  Taking the dog for a walk.  Taking the stairs instead of the elevator.  Dancing to your favorite song.  Doing an exercise video.  Doing your favorite exercise with a friend. RECOMMENDATIONS FOR EXERCISING WITH TYPE 1 OR TYPE 2 DIABETES   Check your blood glucose before exercising. If blood glucose levels are greater than 240 mg/dL, check for urine ketones. Do not exercise if ketones are present.  Avoid injecting insulin into areas of the body that are going to be exercised. For example, avoid injecting insulin into:  The arms when playing tennis.  The legs when jogging.  Keep a record of:  Food intake before and after you exercise.  Expected peak times of insulin action.  Blood   glucose levels before and after you exercise.  The type and amount of exercise you have done.  Review your records with your health care provider. Your health care provider will help you to develop guidelines for adjusting food intake and insulin amounts before and after exercising.  If you take insulin or oral hypoglycemic agents, watch for signs and symptoms of hypoglycemia. They include:  Dizziness.  Shaking.  Sweating.  Chills.  Confusion.  Drink plenty of water while you exercise to prevent dehydration or heat stroke. Body water is lost during exercise and must be replaced.  Talk to your health care provider before starting an exercise program to make sure it is safe for you. Remember, almost any type of activity is better than none. Document Released: 01/14/2004 Document Revised: 03/10/2014 Document Reviewed: 04/02/2013 [facility] Patient Information 2015 [place], [place]. This information is not intended to  replace advice given to you by your health care provider. Make sure you discuss any questions you have with your health care provider. Myocardial Infarction A myocardial infarction (MI) is damage to the heart that is not reversible. It is also called a heart attack. An MI usually occurs when a heart (coronary) artery becomes blocked or narrowed. This cuts off the blood supply to the heart. When one or more of the heart (coronary) arteries becomes blocked, that area of the heart begins to die. This causes pain felt during an MI.  If you think you might be having an MI, call your local emergency services immediately (911 in [place] It is recommended that you chew and swallow 3 non-enteric coated baby aspirin if you do not have an aspirin allergy. Do not drive yourself to the hospital or wait to see if your symptoms go away. The sooner MI is treated, the greater the amount of heart muscle saved. Time is muscle. It can save your life. CAUSES  An MI can occur from:  A gradual buildup of a fatty substance called plaque. When plaque builds up in the arteries, this condition is called atherosclerosis. This buildup can block or reduce the blood supply to the heart artery(s).  A sudden plaque rupture within a heart artery that causes a blood clot (thrombus). A blood clot can block the heart artery which does not allow blood flow to the heart.  A severe tightening (spasm) of the heart artery. This is a less common cause of a heart attack. When a heart artery spasms, it cuts off blood flow through the artery. Spasms can occur in heart arteries that do not have atherosclerosis. RISK FACTORS People at risk for an MI usually have one or more risk factors, such as:  High blood pressure.  High cholesterol.  Smoking.  Gender. Men have a higher heart attack risk.  Overweight/obesity.  Age.  Family history.  Lack of exercise.  Diabetes.  Stress.  Excessive alcohol use.  Street drug use (cocaine and  methamphetamines). SYMPTOMS  MI symptoms can vary, such as:  In both men and women, MI symptoms can include the following:  Chest pain. The chest pain may feel like a crushing, squeezing, or "pressure" type feeling. MI pain can be "referred," meaning pain can be caused in one part of the body but felt in another part of the body. Referred MI pain may occur in the left arm, neck, or jaw. Pain may even be felt in the right arm.  Shortness of breath (dyspnea).  Heartburn or indigestion with or without vomiting, shortness of breath, or sweating (diaphoresis).  Sudden, cold sweats.  Sudden lightheadedness.  Upper back pain.  Women can have unique MI symptoms, such as:  Unexplained feelings of nervousness or anxiety.  Discomfort between the shoulder blades (scapula) or upper back.  Tingling in the hands and arms.  In elderly people (regardless of gender), MI symptoms can be subtle, such as:  Sweating (diaphoresis).  Shortness of breath (dyspnea).  General tiredness (fatigue) or not feeling well (malaise). DIAGNOSIS  Diagnosis of an MI involves several tests such as:  An assessment of your vital signs such as heart rhythm, blood pressure, respiratory rate, and oxygen level.  An EKG (ECG) to look at the electrical activity of your heart.  Blood tests called cardiac markers are drawn at scheduled times to measure proteins or enzymes released by the damaged heart muscle.  A chest X-ray.  An echocardiogram to evaluate heart motion and blood flow.  Coronary angiography (cardiac catheterization). This is a diagnostic procedure to look at the heart arteries. TREATMENT  Acute Intervention. For an MI, the national standard in the [place] is to have an acute intervention in under 90 minutes from the time you get to the hospital. An acute intervention is a special procedure to open up the heart arteries. It is done in a treatment room called a "catheterization lab" (cath lab).  Some hospitals do no have a cath lab. If you are having an MI and the hospital does not have a cath lab, the standard is to transport you to a hospital that has one. In the cath lab, acute intervention includes:  Angioplasty. An angioplasty involves inserting a thin, flexible tube (catheter) into an artery in either your groin or wrist. The catheter is threaded to the heart arteries. A balloon at the end of the catheter is inflated to open a narrowed or blocked heart artery. During an angioplasty procedure, a small mesh tube (stent) may be used to keep the heart artery open. Depending on your condition and health history, one of two types of stents may be placed:  Drug-eluting stent (DES). A DES is coated with a medicine to prevent scar tissue from growing over the stent. With drug-eluting stents, blood thinning medicine will need to be taken for up to a year.  Bare metal stent. This type of stent has no special coating to keep tissue from growing over it. This type of stent is used if you cannot take blood thinning medicine for a prolonged time or you need surgery in the near future. After a bare metal stent is placed, blood thinning medicine will need to be taken for about a month.  If you are taking blood thinning medicine (anti-platelet therapy) after stent placement, do not stop taking it unless your caregiver says it is okay to do so. Make sure you understand how long you need to take the medicine. Surgical Intervention  If an acute intervention is not successful, surgery may be needed:  Open heart surgery (coronary artery bypass graft, CABG). CABG takes a vein (saphenous vein) from your leg. The vein is then attached to the blocked heart artery which bypasses the blockage. This then allows blood flow to the heart muscle. Additional Interventions  A "clot buster" medicine (thrombolytic) may be given. This medicine can help break up a clot in the heart artery. This medicine may be given if a  person cannot get to a cath lab right away.  Intra-aortic balloon pump (IABP). If you have suffered a very severe MI and are too unstable  to go to the cath lab or to surgery, an IABP may be used. This is a temporary mechanical device used to increase blood flow to the heart and reduce the workload of the heart until you are stable enough to go to the cath lab or surgery. HOME CARE INSTRUCTIONS After an MI, you may need the following:  Medicine. Take medicine as directed by your caregiver. Medicines after an MI may:  Keep your blood from clotting easily (blood thinners).  Control your blood pressure.  Help lower your cholesterol.  Control abnormal heart rhythms.  Lifestyle changes. Under the guidance of your caregiver, lifestyle changes include:  Quitting smoking, if you smoke. Your caregiver can help you quit.  Being physically active.  Maintaining a healthy weight.  Eating a heart healthy diet. A dietitian can help you learn healthy eating options.  Managing diabetes.  Reducing stress.  Limiting alcohol intake. SEEK IMMEDIATE MEDICAL CARE IF:   You have severe chest pain, especially if the pain is crushing or pressure-like and spreads to the arms, back, neck, or jaw. This is an emergency. Do not wait to see if the pain will go away. Get medical help at once. Call your local emergency services (911 in the [place] Do not drive yourself to the hospital.  You have shortness of breath during rest, sleep, or with activity.  You have sudden sweating or clammy skin.  You feel sick to your stomach (nauseous) and throw up (vomit).  You suddenly become lightheaded or dizzy.  You feel your heart beating rapidly or you notice "skipped" beats. MAKE SURE YOU:   Understand these instructions.  Will watch your condition.  Will get help right away if you are not doing well or get worse. Document Released: 10/24/2005 Document Revised: 10/29/2013 Document Reviewed:  12/27/2013 [facility] Patient Information 2015 [place], [place]. This information is not intended to replace advice given to you by your health care provider. Make sure you discuss any questions you have with your health care provider.

## 2015-06-24 LAB — COMPLETE METABOLIC PANEL WITH GFR
ALT: 19 U/L (ref 6–29)
AST: 22 U/L (ref 10–35)
Albumin: 4.1 g/dL (ref 3.6–5.1)
Alkaline Phosphatase: 94 U/L (ref 33–130)
BILIRUBIN TOTAL: 0.5 mg/dL (ref 0.2–1.2)
BUN: 21 mg/dL (ref 7–25)
CO2: 20 mmol/L (ref 20–31)
CREATININE: 1.18 mg/dL — AB (ref 0.50–1.05)
Calcium: 9.3 mg/dL (ref 8.6–10.4)
Chloride: 106 mmol/L (ref 98–110)
GFR, Est African American: 60 mL/min (ref >=60)
GFR, Est Non African American: 52 mL/min — ABNORMAL LOW (ref >=60)
GLUCOSE: 80 mg/dL (ref 65–99)
Potassium: 4.2 mmol/L (ref 3.5–5.3)
SODIUM: 136 mmol/L (ref 135–146)
TOTAL PROTEIN: 7.4 g/dL (ref 6.1–8.1)

## 2015-06-25 ENCOUNTER — Telehealth: Payer: Self-pay | Admitting: *Deleted

## 2015-06-25 NOTE — Telephone Encounter (Signed)
i spoke to patient ot let her know she does have genotype for [place] study. I will draw CPK at next office visit. We will proceed to randomization if CPK normal Patient verbalized understanding.

## 2015-06-26 ENCOUNTER — Other Ambulatory Visit: Payer: Self-pay | Admitting: Internal Medicine

## 2015-06-26 ENCOUNTER — Ambulatory Visit (HOSPITAL_COMMUNITY)
Admission: RE | Admit: 2015-06-26 | Discharge: 2015-06-26 | Disposition: A | Discharge status: Home / Self Care | Payer: No Typology Code available for payment source | Source: Ambulatory Visit | Attending: Internal Medicine | Admitting: Internal Medicine

## 2015-06-26 DIAGNOSIS — E042 Nontoxic multinodular goiter: Secondary | ICD-10-CM | POA: Insufficient documentation

## 2015-06-26 DIAGNOSIS — E049 Nontoxic goiter, unspecified: Secondary | ICD-10-CM

## 2015-06-26 DIAGNOSIS — E041 Nontoxic single thyroid nodule: Secondary | ICD-10-CM

## 2015-06-29 ENCOUNTER — Encounter (HOSPITAL_COMMUNITY): Payer: Self-pay | Admitting: *Deleted

## 2015-06-29 ENCOUNTER — Telehealth: Payer: Self-pay

## 2015-06-29 ENCOUNTER — Emergency Department (HOSPITAL_COMMUNITY)
Admission: EM | Admit: 2015-06-29 | Discharge: 2015-06-29 | Disposition: A | Discharge status: Home / Self Care | Payer: Self-pay | Attending: Emergency Medicine | Admitting: Emergency Medicine

## 2015-06-29 ENCOUNTER — Emergency Department (HOSPITAL_COMMUNITY): Payer: Self-pay

## 2015-06-29 DIAGNOSIS — I252 Old myocardial infarction: Secondary | ICD-10-CM | POA: Insufficient documentation

## 2015-06-29 DIAGNOSIS — Z7982 Long term (current) use of aspirin: Secondary | ICD-10-CM | POA: Insufficient documentation

## 2015-06-29 DIAGNOSIS — E785 Hyperlipidemia, unspecified: Secondary | ICD-10-CM | POA: Insufficient documentation

## 2015-06-29 DIAGNOSIS — R1032 Left lower quadrant pain: Secondary | ICD-10-CM | POA: Insufficient documentation

## 2015-06-29 DIAGNOSIS — I10 Essential (primary) hypertension: Secondary | ICD-10-CM | POA: Insufficient documentation

## 2015-06-29 DIAGNOSIS — Z9889 Other specified postprocedural states: Secondary | ICD-10-CM | POA: Insufficient documentation

## 2015-06-29 DIAGNOSIS — Z79899 Other long term (current) drug therapy: Secondary | ICD-10-CM | POA: Insufficient documentation

## 2015-06-29 DIAGNOSIS — R1030 Lower abdominal pain, unspecified: Secondary | ICD-10-CM | POA: Insufficient documentation

## 2015-06-29 DIAGNOSIS — I509 Heart failure, unspecified: Secondary | ICD-10-CM | POA: Insufficient documentation

## 2015-06-29 DIAGNOSIS — E119 Type 2 diabetes mellitus without complications: Secondary | ICD-10-CM | POA: Insufficient documentation

## 2015-06-29 DIAGNOSIS — R300 Dysuria: Secondary | ICD-10-CM | POA: Insufficient documentation

## 2015-06-29 HISTORY — DX: Heart failure, unspecified: I50.9

## 2015-06-29 HISTORY — DX: Type 2 diabetes mellitus without complications: E11.9

## 2015-06-29 HISTORY — DX: Essential (primary) hypertension: I10

## 2015-06-29 LAB — URINALYSIS, ROUTINE W REFLEX MICROSCOPIC
Bilirubin Urine: NEGATIVE
Glucose, UA: NEGATIVE mg/dL
Ketones, ur: 15 mg/dL — AB
Leukocytes, UA: NEGATIVE
Nitrite: NEGATIVE
Protein, ur: NEGATIVE mg/dL
Specific Gravity, Urine: 1.022 (ref 1.005–1.030)
Urobilinogen, UA: 1 mg/dL (ref 0.0–1.0)
pH: 5.5 (ref 5.0–8.0)

## 2015-06-29 LAB — COMPREHENSIVE METABOLIC PANEL
ALT: 15 U/L (ref 14–54)
AST: 18 U/L (ref 15–41)
Albumin: 4.1 g/dL (ref 3.5–5.0)
Alkaline Phosphatase: 102 U/L (ref 38–126)
Anion gap: 9 (ref 5–15)
BUN: 11 mg/dL (ref 6–20)
CO2: 22 mmol/L (ref 22–32)
Calcium: 9.4 mg/dL (ref 8.9–10.3)
Chloride: 106 mmol/L (ref 101–111)
Creatinine, Ser: 1.18 mg/dL — ABNORMAL HIGH (ref 0.44–1.00)
GFR calc Af Amer: 59 mL/min — ABNORMAL LOW (ref >60)
GFR calc non Af Amer: 51 mL/min — ABNORMAL LOW (ref >60)
Glucose, Bld: 94 mg/dL (ref 65–99)
Potassium: 4.2 mmol/L (ref 3.5–5.1)
Sodium: 137 mmol/L (ref 135–145)
Total Bilirubin: 1 mg/dL (ref 0.3–1.2)
Total Protein: 7.9 g/dL (ref 6.5–8.1)

## 2015-06-29 LAB — URINE MICROSCOPIC-ADD ON

## 2015-06-29 LAB — CBC
HCT: 39.2 % (ref 36.0–46.0)
Hemoglobin: 13 g/dL (ref 12.0–15.0)
MCH: 29.7 pg (ref 26.0–34.0)
MCHC: 33.2 g/dL (ref 30.0–36.0)
MCV: 89.5 fL (ref 78.0–100.0)
Platelets: 239 10*3/uL (ref 150–400)
RBC: 4.38 MIL/uL (ref 3.87–5.11)
RDW: 13.7 % (ref 11.5–15.5)
WBC: 8.6 10*3/uL (ref 4.0–10.5)

## 2015-06-29 LAB — LIPASE, BLOOD: Lipase: 23 U/L (ref 22–51)

## 2015-06-29 LAB — POC URINE PREG, ED: Preg Test, Ur: NEGATIVE

## 2015-06-29 MED ORDER — IOHEXOL 300 MG/ML  SOLN
25.0000 mL | Freq: Once | INTRAMUSCULAR | Status: AC | PRN
  Administered 2015-06-29: 25 mL via ORAL

## 2015-06-29 MED ORDER — MORPHINE SULFATE (PF) 4 MG/ML IV SOLN
4.0000 mg | Freq: Once | INTRAVENOUS | Status: AC
  Administered 2015-06-29: 4 mg via INTRAVENOUS
  Filled 2015-06-29: qty 1

## 2015-06-29 MED ORDER — IOHEXOL 300 MG/ML  SOLN
100.0000 mL | Freq: Once | INTRAMUSCULAR | Status: AC | PRN
  Administered 2015-06-29: 100 mL via INTRAVENOUS

## 2015-06-29 MED ORDER — OXYCODONE-ACETAMINOPHEN 5-325 MG PO TABS: 1.0000 | ORAL_TABLET | ORAL | Status: DC | PRN

## 2015-06-29 MED ORDER — DICYCLOMINE HCL 20 MG PO TABS: 20.0000 mg | ORAL_TABLET | Freq: Two times a day (BID) | ORAL | Status: DC

## 2015-06-29 NOTE — Telephone Encounter (Signed)
-----   Message from Tresa Garter, MD sent at 06/26/2015  5:40 PM EDT ----- Please inform patient that her laboratory results show slightly diminished kidney function, advise good blood sugar control and drink plenty of water. We will recheck kidney function during her next visit, and if kidney function remains the same we may have to stop metformin and start new medication for diabetes.

## 2015-06-29 NOTE — ED Notes (Signed)
Pt requesting pain medication; Dr.Pollina made aware

## 2015-06-29 NOTE — ED Notes (Signed)
L arm cool to touch; cms intact; pulses strong; pt denies pain to area

## 2015-06-29 NOTE — Progress Notes (Signed)
Patient ID: Virginia [name], female   DOB: January 07, 1961, 54 y.o.   MRN: [id number] Contacted by CT tech for contrast extravasation.  Patient was having CT scan with IV contrast.  IV in left upper arm.  CT tomogram images demonstrate that the entire bolus extravasated in left upper extremity and left axilla.  The patient is asymptomatic.  She felt coolness in left arm and fingers after the contrast was given.   Exam:  Left arm and axilla is soft.  IV still in place.  Mild coolness in left forearm and wrist.  No neurologic deficits in left upper extremity.  Assessment:  100 ml of contrast extravasated in left upper extremity.  Contrast appears to be dispersed throughout the left upper arm and no signs for compartment syndrome or skin changes.  Plan: Discussed with Dr. Betsey Holiday in ED.  Recommend elevation of the left upper arm and observation for 2-4 hours.  If symptoms develop, recommend further evaluation by Plastic Surgery.

## 2015-06-29 NOTE — ED Notes (Signed)
IV team at bedside attempting to get access.

## 2015-06-29 NOTE — Discharge Instructions (Signed)
Abdominal Pain Many things can cause belly (abdominal) pain. Most times, the belly pain is not dangerous. Many cases of belly pain can be watched and treated at home. HOME CARE  1. Do not take medicines that help you go poop (laxatives) unless told to by your doctor. 2. Only take medicine as told by your doctor. 3. Eat or drink as told by your doctor. Your doctor will tell you if you should be on a special diet. GET HELP IF:  You do not know what is causing your belly pain.  You have belly pain while you are sick to your stomach (nauseous) or have runny poop (diarrhea).  You have pain while you pee or poop.  Your belly pain wakes you up at night.  You have belly pain that gets worse or better when you eat.  You have belly pain that gets worse when you eat fatty foods.  You have a fever. GET HELP RIGHT AWAY IF:  1. The pain does not go away within 2 hours. 2. You keep throwing up (vomiting). 3. The pain changes and is only in the right or left part of the belly. 4. You have bloody or tarry looking poop. MAKE SURE YOU:   Understand these instructions.  Will watch your condition.  Will get help right away if you are not doing well or get worse. Document Released: 04/11/2008 Document Revised: 10/29/2013 Document Reviewed: 07/03/2013 [facility] Patient Information 2015 [place], [place]. This information is not intended to replace advice given to you by your health care provider. Make sure you discuss any questions you have with your health care provider.   Virginia [name]    [id number]   1961-04-13  Type of procedure: CT SCAN  06/29/2015  During your examination at [facility] some of the x-ray dye leaked into the tissues around the vein that the x-ray dye was given through.  What should you do?  4. Apply ice 20 minutes four times a day for three days.  5. Keep the affected extremity elevated above the rest of your body for 48 hours.  6. If you develop any of the following signs  or symptoms, please contact Radiologyor return to ER.  Increased pain  Increasing swelling   Change in sensation (ex. Numbness, tingling)  Development of redness or increase in warmth around the affected area  Increasing hardness  Blistering   I have read and understand these instructions.  Any questions I raised have been discussed to my satisfaction.  I understand that failure to follow these instructions may result in additional complications.

## 2015-06-29 NOTE — ED Provider Notes (Signed)
Patient signed out to me at shift change to follow-up on CT scan. Patient has been experiencing left lower quadrant abdominal discomfort, CT scan ordered to further evaluate for intra-abdominal and pelvic pathology. Patient underwent CT scan. Patient unfortunately had extravasation of the IV contrast into her left upper arm at the IV site. Recommendations were for monitoring for 2-4 hours. Patient has been continuously monitored and there are no significant changes noted. Patient has no skin changes, no blistering, no erythema, no warmth. There is no significant swelling of the arm. Forearm from elbow to fingers is normal, no swelling, no signs of compartment syndrome. Patient has palpable radial pulse. She has normal range of motion at the elbow, wrist and fingers. Normal strength and sensation. At this point it is felt that the patient is appropriate for discharge. I did discuss with her the fact CAT scan did not show any acute pathology, patient is appropriate for pain management, follow-up with PCP. Return to the ER for worsening symptoms.  Orpah Greek, MD 06/29/15 [phone]

## 2015-06-29 NOTE — ED Notes (Signed)
MD at bedside. 

## 2015-06-29 NOTE — ED Notes (Signed)
Bladder scan shows 80ml, pt ambulated to restroom to get urine sample

## 2015-06-29 NOTE — ED Provider Notes (Signed)
CSN: [id number]     Arrival date & time 06/29/15  1032 History   First MD Initiated Contact with Patient 06/29/15 1432     Chief Complaint  Patient presents with  . Abdominal Pain     (Consider location/radiation/quality/duration/timing/severity/associated sxs/prior Treatment) The history is provided by the patient and medical records. No language interpreter was used.     Virginia [name] is a 54 y.o. female  with a hx of lipidemia, STEMI, numbness on the diabetes, hypertension presents to the Emergency Department complaining of gradual, persistent, progressively worsening lower abd pain (worst in the left) onset 2 days ago.  Pt reports she had 4 days of diarrhea before this began.  Pt reports her diarrhea stopped before her abdominal pain began. She denies travel or recent antibiotics. She denies melanoma hematochezia.  Associated symptoms include decreased urination.  Nothing makes it better and nothing makes it worse.  Pt denies fever, chills, headache, neck pain, chest pain, shortness of breath, vomiting, weakness, syncope, vaginal discharge.  Abdominal surgeries include appendectomy and cesarean section.   Past Medical History  Diagnosis Date  . Hyperlipidemia with target LDL less than 70   . STEMI (ST elevation myocardial infarction) 06/16/2015    BMS x 2 CFX  . Diabetes mellitus without complication   . Hypertension    Past Surgical History  Procedure Laterality Date  . Appendectomy    . Cesarean section    . Cardiac catheterization N/A 06/17/2015    Procedure: Left Heart Cath and Coronary Angiography;  Surgeon: Jettie Booze, MD;  Location: [facility];  Service: Cardiovascular;  Laterality: N/A;  . Cardiac catheterization  06/17/2015    Procedure: Coronary Stent Intervention;  Surgeon: Jettie Booze, MD;  Location: [facility];  Service: Cardiovascular;;   Family History  Problem Relation Age of Onset  . Stroke Mother   . Cancer Father   .  Hypertension Sister   . Hyperlipidemia Sister   . Heart attack Brother   . Hypertension Brother   . Heart attack Maternal Uncle   . Asthma Paternal Uncle    Social History  Substance Use Topics  . Smoking status: Never Smoker   . Smokeless tobacco: Never Used  . Alcohol Use: No   OB History    No data available     Review of Systems  Constitutional: Negative for fever, diaphoresis, appetite change, fatigue and unexpected weight change.  HENT: Negative for mouth sores.   Eyes: Negative for visual disturbance.  Respiratory: Negative for cough, chest tightness, shortness of breath and wheezing.   Cardiovascular: Negative for chest pain.  Gastrointestinal: Positive for abdominal pain and diarrhea ( resolved). Negative for nausea, vomiting and constipation.  Endocrine: Negative for polydipsia, polyphagia and polyuria.  Genitourinary: Positive for difficulty urinating. Negative for dysuria, urgency, frequency and hematuria.  Musculoskeletal: Negative for back pain and neck stiffness.  Skin: Negative for rash.  Allergic/Immunologic: Negative for immunocompromised state.  Neurological: Negative for syncope, light-headedness and headaches.  Hematological: Does not bruise/bleed easily.  Psychiatric/Behavioral: Negative for sleep disturbance. The patient is not nervous/anxious.       Allergies  Review of patient's allergies indicates no known allergies.  Home Medications   Prior to Admission medications   Medication Sig Start Date End Date Taking? Authorizing Provider  aspirin 81 MG tablet Take 1 tablet (81 mg total) by mouth once. 06/23/15   Tresa Garter, MD  atorvastatin (LIPITOR) 80 MG tablet Take 1 tablet (80  mg total) by mouth daily at 6 PM. 06/23/15   Tresa Garter, MD  lisinopril (PRINIVIL,ZESTRIL) 2.5 MG tablet Take 1 tablet (2.5 mg total) by mouth daily. 06/23/15   Tresa Garter, MD  metFORMIN (GLUCOPHAGE) 500 MG tablet Take 1 tablet (500 mg total) by mouth  2 (two) times daily with a meal. 06/23/15   Tresa Garter, MD  metoprolol tartrate (LOPRESSOR) 25 MG tablet Take 1 tablet (25 mg total) by mouth 2 (two) times daily. 06/23/15   Tresa Garter, MD  nitroGLYCERIN (NITROSTAT) 0.4 MG SL tablet Place 1 tablet (0.4 mg total) under the tongue every 5 (five) minutes as needed for chest pain. 06/19/15   Brittainy Erie Noe, PA-C  ticagrelor (BRILINTA) 90 MG TABS tablet Take 1 tablet (90 mg total) by mouth 2 (two) times daily. 06/23/15   Tresa Garter, MD   BP 140/90 mmHg  Pulse 69  Temp(Src) 98.7 F (37.1 C) (Oral)  Resp 15  Ht 5\' 6"  (1.676 m)  Wt 190 lb 9.6 oz (86.456 kg)  BMI 30.78 kg/m2  SpO2 100% Physical Exam  Constitutional: She appears well-developed and well-nourished. No distress.  Awake, alert, nontoxic appearance  HENT:  Head: Normocephalic and atraumatic.  Mouth/Throat: Oropharynx is clear and moist. No oropharyngeal exudate.  Eyes: Conjunctivae are normal. No scleral icterus.  Neck: Normal range of motion. Neck supple.  Cardiovascular: Normal rate, regular rhythm, normal heart sounds and intact distal pulses.   No murmur heard. Pulmonary/Chest: Effort normal and breath sounds normal. No respiratory distress. She has no wheezes.  Equal chest expansion  Abdominal: Soft. Bowel sounds are normal. She exhibits no distension and no mass. There is tenderness in the suprapubic area and left lower quadrant. There is guarding and CVA tenderness (left, mild ). There is no rebound.  Musculoskeletal: Normal range of motion. She exhibits no edema.  Neurological: She is alert.  Speech is clear and goal oriented Moves extremities without ataxia  Skin: Skin is warm and dry. No rash noted. She is not diaphoretic. No erythema.  Psychiatric: She has a normal mood and affect.  Nursing note and vitals reviewed.   ED Course  Procedures (including critical care time) Labs Review Labs Reviewed  COMPREHENSIVE METABOLIC PANEL -  Abnormal; Notable for the following:    Creatinine, Ser 1.18 (*)    GFR calc non Af Amer 51 (*)    GFR calc Af Amer 59 (*)    All other components within normal limits  LIPASE, BLOOD  CBC  URINALYSIS, ROUTINE W REFLEX MICROSCOPIC (NOT AT [facility])  POC URINE PREG, ED    Imaging Review No results found. I have personally reviewed and evaluated these images and lab results as part of my medical decision-making.   EKG Interpretation   Date/Time:  Monday June 29 2015 11:22:25 EDT Ventricular Rate:  73 PR Interval:  148 QRS Duration: 72 QT Interval:  356 QTC Calculation: 392 R Axis:   58 Text Interpretation:  Normal sinus rhythm T wave abnormality, consider  inferior ischemia Abnormal ECG No significant change since last tracing  Confirmed by LIU MD, DANA ([id number]) on 06/29/2015 11:28:27 AM      MDM   Final diagnoses:  Lower abdominal pain   Benna Dunks presents with lower abdominal pain with decreased urination for the last several days. Patient with previous episodes of diarrhea which have resolved. No nausea or vomiting.  No chest pain or shortness of breath.    Patient reports  that she has not been urinating for the last several days however her mixed membranes remain moist, her serum creatinine is elevated only slightly to 1.18 and I am unable to palpate her bladder and her abdomen. Obtain bladder scan and an out catheterization.  CT scan pending for rule out of diverticulitis or other abdominal pathology.    3:51 PM Bladder scan with 55 ML. Patient able to provide a urine sample without assistance.  Patient discussed with and care transferred to Dr. Betsey Holiday.    BP 140/90 mmHg  Pulse 69  Temp(Src) 98.7 F (37.1 C) (Oral)  Resp 15  Ht 5\' 6"  (1.676 m)  Wt 190 lb 9.6 oz (86.456 kg)  BMI 30.78 kg/m2  SpO2 100%   Abigail Butts, PA-C 06/29/15 [place], MD 07/03/15 [phone]

## 2015-06-29 NOTE — Telephone Encounter (Signed)
Called and informed patient of lab results and to drink plenty of water and keep blood sugar in control. Patient verbalized understanding. Thanks!

## 2015-06-29 NOTE — Telephone Encounter (Signed)
-----   Message from Tresa Garter, MD sent at 06/26/2015  5:13 PM EDT ----- Please inform patient that her thyroid ultrasound shows multiple nodules with one dominant nodule on the left, she will need a biopsy. For this reason we will refer her to endocrinologist for ultrasound-guided biopsy. I have placed orders for referral.

## 2015-06-29 NOTE — ED Notes (Signed)
Pt states that she has had a left sided abdominal pain that started on Saturday. Pt states that it has become generalized. Pt reports that she had had diarrhea for about 4 days prior to Saturday. Pt reports nausea yesterday.

## 2015-07-01 ENCOUNTER — Encounter: Payer: Self-pay | Admitting: Cardiology

## 2015-07-01 ENCOUNTER — Ambulatory Visit (INDEPENDENT_AMBULATORY_CARE_PROVIDER_SITE_OTHER): Payer: Self-pay | Admitting: Cardiology

## 2015-07-01 VITALS — BP 100/68 | HR 66 | Ht 66.0 in | Wt 191.8 lb

## 2015-07-01 DIAGNOSIS — E049 Nontoxic goiter, unspecified: Secondary | ICD-10-CM

## 2015-07-01 DIAGNOSIS — E785 Hyperlipidemia, unspecified: Secondary | ICD-10-CM

## 2015-07-01 DIAGNOSIS — E042 Nontoxic multinodular goiter: Secondary | ICD-10-CM

## 2015-07-01 DIAGNOSIS — Z9861 Coronary angioplasty status: Secondary | ICD-10-CM

## 2015-07-01 DIAGNOSIS — N182 Chronic kidney disease, stage 2 (mild): Secondary | ICD-10-CM

## 2015-07-01 DIAGNOSIS — N289 Disorder of kidney and ureter, unspecified: Secondary | ICD-10-CM | POA: Insufficient documentation

## 2015-07-01 DIAGNOSIS — E1121 Type 2 diabetes mellitus with diabetic nephropathy: Secondary | ICD-10-CM

## 2015-07-01 DIAGNOSIS — R109 Unspecified abdominal pain: Secondary | ICD-10-CM | POA: Insufficient documentation

## 2015-07-01 DIAGNOSIS — R1012 Left upper quadrant pain: Secondary | ICD-10-CM

## 2015-07-01 DIAGNOSIS — I2510 Atherosclerotic heart disease of native coronary artery without angina pectoris: Secondary | ICD-10-CM

## 2015-07-01 MED ORDER — OMEPRAZOLE 20 MG PO CPDR: 20.0000 mg | DELAYED_RELEASE_CAPSULE | Freq: Every day | ORAL | Status: DC

## 2015-07-01 NOTE — Assessment & Plan Note (Signed)
On statin Rx 

## 2015-07-01 NOTE — Assessment & Plan Note (Signed)
To be established with a PCP

## 2015-07-01 NOTE — Patient Instructions (Addendum)
Medication Instructions:  Your physician has recommended you make the following change in your medication:  1.  Start Prilosec 20 mg taking 1 daily  Labwork: Your physician recommends that you return for a FASTING lipid profile and CMET in 4 weeks.  Testing/Procedures: None ordered  Follow-Up: Keep your f/u appt with Dr. Irish Lack as scheduled.  Any Other Special Instructions Will Be Listed Below (If Applicable).  Your new appt with your Primary Care Office has been moved up to 07-09-15 @ 11:30.

## 2015-07-01 NOTE — Progress Notes (Signed)
07/01/2015 Virginia [name]   1961/07/23  [id number]  Primary Physician Pcp Not In System Primary Cardiologist: Dr Irish Lack (new)  HPI:  54 y.o. female with no significant PMH who presented to the ED via EMS on 06/16/2015 for chest pain. EKG showed ST elevation in inferior and anterolateral leads and Code STEMI was activated. She was taken emergently to the cath lab. Her mid Cx was 100% stenosed with a 0% residual stenosis post intervention. Two overlapping bare metal stents were placed due to there being a proximal edge dissection. It was unclear whether this was spasm therefore a second stent was placed. Bare metal stents were utilized due the patient admitting she had issues obtaining medications. Dual antiplatelet therapy for at least 30 days was recommended.   She is in the office today for follow up. She says she had diarrhea for several days after discharge but this resolved spontaneously a few days ago. She was seen in the ED 48 hrs ago for abdominal pain. CT and labs WNL. She continues to complain of abdominal pain. It seem to be cramplike and LUQ. No fever nausea or vomiting.    Current Outpatient Prescriptions  Medication Sig Dispense Refill  . aspirin 81 MG tablet Take 1 tablet (81 mg total) by mouth once. 90 tablet 3  . atorvastatin (LIPITOR) 80 MG tablet Take 1 tablet (80 mg total) by mouth daily at 6 PM. 90 tablet 3  . dicyclomine (BENTYL) 20 MG tablet Take 1 tablet (20 mg total) by mouth 2 (two) times daily. 20 tablet 0  . lisinopril (PRINIVIL,ZESTRIL) 2.5 MG tablet Take 1 tablet (2.5 mg total) by mouth daily. 90 tablet 3  . metFORMIN (GLUCOPHAGE) 500 MG tablet Take 1 tablet (500 mg total) by mouth 2 (two) times daily with a meal. 180 tablet 3  . metoprolol tartrate (LOPRESSOR) 25 MG tablet Take 1 tablet (25 mg total) by mouth 2 (two) times daily. 180 tablet 3  . nitroGLYCERIN (NITROSTAT) 0.4 MG SL tablet Place 1 tablet (0.4 mg total) under the tongue every 5 (five) minutes as  needed for chest pain. 25 tablet 2  . omeprazole (PRILOSEC) 20 MG capsule Take 1 capsule (20 mg total) by mouth daily. 30 capsule 11  . oxyCODONE-acetaminophen (PERCOCET) 5-325 MG per tablet Take 1-2 tablets by mouth every 4 (four) hours as needed. (Patient taking differently: Take 1-2 tablets by mouth every 4 (four) hours as needed (for pain). ) 15 tablet 0  . ticagrelor (BRILINTA) 90 MG TABS tablet Take 1 tablet (90 mg total) by mouth 2 (two) times daily. 60 tablet 3   No current facility-administered medications for this visit.    No Known Allergies  Social History   Social History  . Marital Status: Married    Spouse Name: N/A  . Number of Children: N/A  . Years of Education: N/A   Occupational History  . Not on file.   Social History Main Topics  . Smoking status: Never Smoker   . Smokeless tobacco: Never Used  . Alcohol Use: No  . Drug Use: No  . Sexual Activity: Not on file   Other Topics Concern  . Not on file   Social History Narrative     Review of Systems: General: negative for chills, fever, night sweats or weight changes.  Cardiovascular: negative for chest pain, dyspnea on exertion, edema, orthopnea, palpitations, paroxysmal nocturnal dyspnea or shortness of breath Dermatological: negative for rash Respiratory: negative for cough or wheezing Urologic: negative  for hematuria Abdominal: negative for nausea, vomiting, diarrhea, bright red blood per rectum, melena, or hematemesis Neurologic: negative for visual changes, syncope, or dizziness All other systems reviewed and are otherwise negative except as noted above.    Blood pressure 100/68, pulse 66, height 5\' 6"  (1.676 m), weight 191 lb 12.8 oz (87 kg).  General appearance: alert, cooperative, no distress and moderately obese Neck: no carotid bruit and no JVD Lungs: clear to auscultation bilaterally Heart: regular rate and rhythm, S1, S2 normal, no murmur, click, rub or gallop Abdomen: obese, no obvious  mass or tenderness Extremities: extremities normal, atraumatic, no cyanosis or edema Pulses: 2+ and symmetric Skin: Skin color, texture, turgor normal. No rashes or lesions Neurologic: Grossly normal  EKG NSR, TWI 2,3,F and V3-V6  ASSESSMENT AND PLAN:   CAD S/P CFX BMS (? med complinace) x 2 06/16/15 No angina since discharge  Abdominal pain Pt has been complaining of abdominal pain- seen and worked in ED 48 hrs ago, (negative)  Nodular goiter To see Dr Loanne Drilling for further evaluation.  Chronic renal disease, stage II .  Hyperlipidemia with target LDL less than 70 On statin Rx  Type 2 diabetes mellitus with renal manifestations To be established with a PCP   PLAN  I arranged for her apt with her new PCP to be moved up to next week. I could find no obvious pathology on exam. I suggested she start Prilosec OTC. If her pain worsens or her symptoms change she can go back to the ED.   Kerin Ransom K PA-C 07/01/2015 3:03 PM

## 2015-07-01 NOTE — Assessment & Plan Note (Signed)
No angina since discharge

## 2015-07-01 NOTE — Assessment & Plan Note (Signed)
To see Dr Loanne Drilling for further evaluation.

## 2015-07-01 NOTE — Assessment & Plan Note (Signed)
Pt has been complaining of abdominal pain- seen and worked in ED 48 hrs ago, (negative)

## 2015-07-03 ENCOUNTER — Ambulatory Visit: Payer: Self-pay | Attending: Family Medicine

## 2015-07-09 ENCOUNTER — Ambulatory Visit (INDEPENDENT_AMBULATORY_CARE_PROVIDER_SITE_OTHER): Payer: Self-pay | Admitting: Endocrinology

## 2015-07-09 ENCOUNTER — Other Ambulatory Visit (HOSPITAL_COMMUNITY)
Admission: RE | Admit: 2015-07-09 | Discharge: 2015-07-09 | Disposition: A | Discharge status: Home / Self Care | Payer: No Typology Code available for payment source | Source: Ambulatory Visit | Attending: Endocrinology | Admitting: Endocrinology

## 2015-07-09 ENCOUNTER — Encounter: Payer: Self-pay | Admitting: Endocrinology

## 2015-07-09 ENCOUNTER — Encounter: Payer: Self-pay | Admitting: Family Medicine

## 2015-07-09 ENCOUNTER — Ambulatory Visit (INDEPENDENT_AMBULATORY_CARE_PROVIDER_SITE_OTHER): Payer: Self-pay | Admitting: Family Medicine

## 2015-07-09 VITALS — BP 120/74 | HR 75 | Temp 98.1°F | Ht 66.0 in | Wt 187.0 lb

## 2015-07-09 VITALS — BP 111/60 | HR 68 | Temp 98.2°F | Resp 14 | Ht 66.0 in | Wt 186.0 lb

## 2015-07-09 DIAGNOSIS — R109 Unspecified abdominal pain: Secondary | ICD-10-CM

## 2015-07-09 DIAGNOSIS — E042 Nontoxic multinodular goiter: Secondary | ICD-10-CM | POA: Insufficient documentation

## 2015-07-09 DIAGNOSIS — E118 Type 2 diabetes mellitus with unspecified complications: Secondary | ICD-10-CM

## 2015-07-09 DIAGNOSIS — R197 Diarrhea, unspecified: Secondary | ICD-10-CM

## 2015-07-09 DIAGNOSIS — E041 Nontoxic single thyroid nodule: Secondary | ICD-10-CM | POA: Insufficient documentation

## 2015-07-09 LAB — GLUCOSE, CAPILLARY: GLUCOSE-CAPILLARY: 71 mg/dL (ref 65–99)

## 2015-07-09 NOTE — Patient Instructions (Signed)
Continue things as are for now. Follow-up as planned with cardiology and then here in November. Return in two weeks if diarrhea has not resolved.

## 2015-07-09 NOTE — Progress Notes (Signed)
Patient ID: LANE KJOS, female   DOB: 03/23/1961, 54 y.o.   MRN: [id number]   Virginia [name], is a 54 y.o. female  DTO:[id number]  XIP:[id number]  DOB - 06/24/1961  CC:  Chief Complaint  Patient presents with  . Abdominal Pain    follow up   . Diarrhea       HPI: Virginia [name] is a 54 y.o. female here for follow-up of an ED visit on 8/22. She presented with left lower abdominal pain and diarrhea. Imaging studies showed no recognizable cause for her abdominal pain and diarrhea. She was told that it was probably related to metformin that she has recently started taking for type 2 diabetes that was started on 8/16 and she was in the ED on 8/22. She was asked to follow-up with [place] see how things are going. she reports she's had no abdominal pain in the last 4 days. she still does continued to have some diarrhea but it is better is ,down to small amounts through 4 times a day and she feels for now that is manageable and would like to continue the metformin to see if this continues to resolve No Known Allergies Past Medical History  Diagnosis Date  . Hyperlipidemia with target LDL less than 70   . STEMI (ST elevation myocardial infarction) 06/16/2015    BMS x 2 CFX  . Diabetes mellitus without complication   . Hypertension   . CHF (congestive heart failure)    Current Outpatient Prescriptions on File Prior to Visit  Medication Sig Dispense Refill  . aspirin 81 MG tablet Take 1 tablet (81 mg total) by mouth once. 90 tablet 3  . atorvastatin (LIPITOR) 80 MG tablet Take 1 tablet (80 mg total) by mouth daily at 6 PM. 90 tablet 3  . lisinopril (PRINIVIL,ZESTRIL) 2.5 MG tablet Take 1 tablet (2.5 mg total) by mouth daily. 90 tablet 3  . metFORMIN (GLUCOPHAGE) 500 MG tablet Take 1 tablet (500 mg total) by mouth 2 (two) times daily with a meal. 180 tablet 3  . metoprolol tartrate (LOPRESSOR) 25 MG tablet Take 1 tablet (25 mg total) by mouth 2 (two) times daily. 180 tablet 3  . nitroGLYCERIN  (NITROSTAT) 0.4 MG SL tablet Place 1 tablet (0.4 mg total) under the tongue every 5 (five) minutes as needed for chest pain. 25 tablet 2  . ticagrelor (BRILINTA) 90 MG TABS tablet Take 1 tablet (90 mg total) by mouth 2 (two) times daily. 60 tablet 3  . dicyclomine (BENTYL) 20 MG tablet Take 1 tablet (20 mg total) by mouth 2 (two) times daily. (Patient not taking: Reported on 07/09/2015) 20 tablet 0  . omeprazole (PRILOSEC) 20 MG capsule Take 1 capsule (20 mg total) by mouth daily. (Patient not taking: Reported on 07/09/2015) 30 capsule 11  . oxyCODONE-acetaminophen (PERCOCET) 5-325 MG per tablet Take 1-2 tablets by mouth every 4 (four) hours as needed. (Patient not taking: Reported on 07/09/2015) 15 tablet 0   No current facility-administered medications on file prior to visit.   Family History  Problem Relation Age of Onset  . Stroke Mother   . Cancer Father   . Hypertension Sister   . Hyperlipidemia Sister   . Heart attack Brother   . Hypertension Brother   . Heart attack Maternal Uncle   . Asthma Paternal Uncle   . Thyroid disease Neg Hx    Social History   Social History  . Marital Status: Married    Spouse Name: N/A  .  Number of Children: N/A  . Years of Education: N/A   Occupational History  . Not on file.   Social History Main Topics  . Smoking status: Never Smoker   . Smokeless tobacco: Never Used  . Alcohol Use: No  . Drug Use: No  . Sexual Activity: Not on file   Other Topics Concern  . Not on file   Social History Narrative    Review of Systems: Constitutional: Negative for fever, chills, appetite change, weight loss. Positive for some weight gain and tiredness. HENT: Negative for ear pain, ear discharge.nose bleeds Eyes: Negative for pain, discharge, redness, itching and visual disturbance. Neck: Negative for pain, stiffness Respiratory: Negative for cough, shortness of breath,   Cardiovascular: Negative for chest pain, palpitations and leg  swelling. Gastrointestinal: Negative for abdominal distention, abdominal pain, nausea, vomiting, constipations. Positive for diarrhea Genitourinary: Negative for dysuria, urgency, frequency, hematuria, flank pain,  Musculoskeletal: Negative for back pain, joint pain, joint  swelling, arthralgia and gait problem.Negative for weakness. Neurological: Negative for dizziness, tremors, seizures, syncope,   light-headedness, numbness and headaches.  Hematological: Negative for easy bruising or bleeding Psychiatric/Behavioral: Negative for depression, anxiety, decreased concentration, confusion    Objective:   Filed Vitals:   07/09/15 1129  BP: 111/60  Pulse: 68  Temp: 98.2 F (36.8 C)  Resp: 14    Physical Exam: Constitutional: Patient appears well-developed and well-nourished. No distress. HENT: Normocephalic, atraumatic, External right and left ear normal. Oropharynx is clear and moist.  Eyes: Conjunctivae and EOM are normal. PERRLA, no scleral icterus. Neck: Normal ROM. Neck supple. No lymphadenopathy, No thyromegaly. CVS: RRR, S1/S2 +, no murmurs, no gallops, no rubs Pulmonary: Effort and breath sounds normal, no stridor, rhonchi, wheezes, rales.  Abdominal: Soft. Normoactive BS,, no distension, tenderness, rebound or guarding.  Musculoskeletal: Normal range of motion. No edema and no tenderness.  Neuro: Alert.Normal muscle tone coordination. Non-focal Skin: Skin is warm and dry. No rash noted. Not diaphoretic. No erythema. No pallor. Psychiatric: Normal mood and affect. Behavior, judgment, thought content normal.  Lab Results  Component Value Date   WBC 8.6 06/29/2015   HGB 13.0 06/29/2015   HCT 39.2 06/29/2015   MCV 89.5 06/29/2015   PLT 239 06/29/2015   Lab Results  Component Value Date   CREATININE 1.18* 06/29/2015   BUN 11 06/29/2015   NA 137 06/29/2015   K 4.2 06/29/2015   CL 106 06/29/2015   CO2 22 06/29/2015    Lab Results  Component Value Date   HGBA1C 6.6*  06/17/2015   Lipid Panel     Component Value Date/Time   CHOL 248* 06/17/2015 0631   TRIG 162* 06/17/2015 0631   HDL 45 06/17/2015 0631   CHOLHDL 5.5 06/17/2015 0631   VLDL 32 06/17/2015 0631   LDLCALC 171* 06/17/2015 0631       Assessment and plan:   1. Diarrhea, improving - I have explained that this may be related to metformin. As it is improving, she wishes to continue the metformin and see if it resolves  2. Abdominal pain, unspecified abdominal location, resolved - Follow-up if needed.    Return if symptoms worsen or fail to improve.  The patient was given clear instructions to go to ER or return to medical center if symptoms don't improve, worsen or new problems develop. The patient verbalized understanding.      Micheline Chapman, MSN, FNP-BC   07/09/2015, 12:10 PM

## 2015-07-09 NOTE — Patient Instructions (Signed)
We'll let you know about the results of the biopsy. if no cancer is found, please come back for a follow-up appointment in 6-12 months. most of the time, a "lumpy thyroid" will eventually become overactive.  this is usually a slow process, happening over the span of many years.

## 2015-07-09 NOTE — Progress Notes (Signed)
Subjective:    Patient ID: Virginia [name], female    DOB: 1961-09-11, 54 y.o.   MRN: [id number]  HPI In June of 2016, pt had a CT of the C-spine, and goiter was incidentally noted.  She has slight pain at the anterior neck, but no assoc swelling.  Pt is unaware of ever having had thyroid problems in the past.  He has no h/o XRT or surgery to the neck.   Past Medical History  Diagnosis Date  . Hyperlipidemia with target LDL less than 70   . STEMI (ST elevation myocardial infarction) 06/16/2015    BMS x 2 CFX  . Diabetes mellitus without complication   . Hypertension   . CHF (congestive heart failure)     Past Surgical History  Procedure Laterality Date  . Appendectomy    . Cesarean section    . Cardiac catheterization N/A 06/17/2015    Procedure: Left Heart Cath and Coronary Angiography;  Surgeon: Jettie Booze, MD;  Location: [facility];  Service: Cardiovascular;  Laterality: N/A;  . Cardiac catheterization  06/17/2015    Procedure: Coronary Stent Intervention;  Surgeon: Jettie Booze, MD;  Location: [facility];  Service: Cardiovascular;;    Social History   Social History  . Marital Status: Married    Spouse Name: N/A  . Number of Children: N/A  . Years of Education: N/A   Occupational History  . Not on file.   Social History Main Topics  . Smoking status: Never Smoker   . Smokeless tobacco: Never Used  . Alcohol Use: No  . Drug Use: No  . Sexual Activity: Not on file   Other Topics Concern  . Not on file   Social History Narrative    Current Outpatient Prescriptions on File Prior to Visit  Medication Sig Dispense Refill  . aspirin 81 MG tablet Take 1 tablet (81 mg total) by mouth once. 90 tablet 3  . atorvastatin (LIPITOR) 80 MG tablet Take 1 tablet (80 mg total) by mouth daily at 6 PM. 90 tablet 3  . dicyclomine (BENTYL) 20 MG tablet Take 1 tablet (20 mg total) by mouth 2 (two) times daily. (Patient not taking: Reported on 07/09/2015)  20 tablet 0  . lisinopril (PRINIVIL,ZESTRIL) 2.5 MG tablet Take 1 tablet (2.5 mg total) by mouth daily. 90 tablet 3  . metFORMIN (GLUCOPHAGE) 500 MG tablet Take 1 tablet (500 mg total) by mouth 2 (two) times daily with a meal. 180 tablet 3  . metoprolol tartrate (LOPRESSOR) 25 MG tablet Take 1 tablet (25 mg total) by mouth 2 (two) times daily. 180 tablet 3  . nitroGLYCERIN (NITROSTAT) 0.4 MG SL tablet Place 1 tablet (0.4 mg total) under the tongue every 5 (five) minutes as needed for chest pain. 25 tablet 2  . omeprazole (PRILOSEC) 20 MG capsule Take 1 capsule (20 mg total) by mouth daily. (Patient not taking: Reported on 07/09/2015) 30 capsule 11  . oxyCODONE-acetaminophen (PERCOCET) 5-325 MG per tablet Take 1-2 tablets by mouth every 4 (four) hours as needed. (Patient not taking: Reported on 07/09/2015) 15 tablet 0  . ticagrelor (BRILINTA) 90 MG TABS tablet Take 1 tablet (90 mg total) by mouth 2 (two) times daily. 60 tablet 3   No current facility-administered medications on file prior to visit.    No Known Allergies  Family History  Problem Relation Age of Onset  . Stroke Mother   . Cancer Father   . Hypertension Sister   .  Hyperlipidemia Sister   . Heart attack Brother   . Hypertension Brother   . Heart attack Maternal Uncle   . Asthma Paternal Uncle   . Thyroid disease Neg Hx     BP 120/74 mmHg  Pulse 75  Temp(Src) 98.1 F (36.7 C) (Oral)  Ht 5\' 6"  (1.676 m)  Wt 187 lb (84.823 kg)  BMI 30.20 kg/m2  SpO2 98%    Review of Systems denies hoarseness, dysphagia, headache, double vision, palpitations, sob, polyuria, myalgias, excessive diaphoresis, tremor, cold intolerance, and rhinorrhea.  She has weight gain, and anxiety.    Objective:   Physical Exam VS: see vs page GEN: no distress HEAD: head: no deformity eyes: no periorbital swelling, no proptosis external nose and ears are normal mouth: no lesion seen NECK: the left thyroid nodule is palpable, and freely mobile.     CHEST WALL: no deformity LUNGS:  Clear to auscultation CV: reg rate and rhythm, no murmur.   MUSCULOSKELETAL: muscle bulk and strength are grossly normal.  no obvious joint swelling.  gait is normal and steady. EXTEMITIES: no deformity.  no edema NEURO:  cn 2-12 grossly intact.   readily moves all 4's.  sensation is intact to touch on all 4's SKIN:  Normal texture and temperature.  No rash or suspicious lesion is visible.   NODES:  None palpable at the neck PSYCH: alert, well-oriented.  Does not appear anxious nor depressed.  Lab Results  Component Value Date   TSH 0.963 06/17/2015   Radiol thyroid US (06/26/15): large mass on the left measures 5.0 x 3.0 x 2.5 cm  Procedure: thyroid needle bx: consent obtained, signed form on chart The area is first sprayed with cooling agent local: xylocaine 2%, with epinephrine prep: alcohol pad bxs are done with 70Y needles no complications     Assessment & Plan:  Multinodular goiter, new  Patient is advised the following: Patient Instructions  We'll let you know about the results of the biopsy. if no cancer is found, please come back for a follow-up appointment in 6-12 months. most of the time, a "lumpy thyroid" will eventually become overactive.  this is usually a slow process, happening over the span of many years.

## 2015-07-14 ENCOUNTER — Ambulatory Visit: Payer: Self-pay | Attending: Internal Medicine

## 2015-07-24 ENCOUNTER — Encounter: Payer: Self-pay | Admitting: Interventional Cardiology

## 2015-07-24 ENCOUNTER — Telehealth: Payer: Self-pay | Admitting: Internal Medicine

## 2015-07-24 ENCOUNTER — Ambulatory Visit (INDEPENDENT_AMBULATORY_CARE_PROVIDER_SITE_OTHER): Payer: No Typology Code available for payment source | Admitting: Interventional Cardiology

## 2015-07-24 VITALS — BP 118/82 | HR 60 | Ht 66.0 in | Wt 183.0 lb

## 2015-07-24 DIAGNOSIS — I2121 ST elevation (STEMI) myocardial infarction involving left circumflex coronary artery: Secondary | ICD-10-CM

## 2015-07-24 DIAGNOSIS — I2510 Atherosclerotic heart disease of native coronary artery without angina pectoris: Secondary | ICD-10-CM

## 2015-07-24 DIAGNOSIS — E785 Hyperlipidemia, unspecified: Secondary | ICD-10-CM

## 2015-07-24 DIAGNOSIS — Z9861 Coronary angioplasty status: Secondary | ICD-10-CM

## 2015-07-24 DIAGNOSIS — E1121 Type 2 diabetes mellitus with diabetic nephropathy: Secondary | ICD-10-CM

## 2015-07-24 DIAGNOSIS — N939 Abnormal uterine and vaginal bleeding, unspecified: Secondary | ICD-10-CM

## 2015-07-24 NOTE — Progress Notes (Signed)
Patient ID: Virginia [name], female   DOB: 03-10-1961, 54 y.o.   MRN: [id number]     Cardiology Office Note   Date:  07/24/2015   ID:  Virginia [name], DOB [birth year], MRN [id number]  PCP:  [name], MD    No chief complaint on file. f/u MI   Wt Readings from Last 3 Encounters:  07/24/15 183 lb (83.008 kg)  07/09/15 186 lb (84.369 kg)  07/09/15 187 lb (84.823 kg)       History of Present Illness: Virginia [name] is a 54 y.o. female  Who had a lateral MI in August 2016.  She has not had any chest pain since leaving the hospital.  She walks daily for 20 minute total during the day.  No sx with this.  No SHOB with walking.  Some SHOB when she lies down.  She uses two pillows with relief.   She had some vaginal bleeding that lasted a week.  It was like a period but she had not had a period in 6 years.   She has not had a GYN exam in several years.    No lightheadedness, palpitations, syncope.   She states blood sugars have been well controlled. She has been compliant with medications.     Past Medical History  Diagnosis Date  . Hyperlipidemia with target LDL less than 70   . STEMI (ST elevation myocardial infarction) 06/16/2015    BMS x 2 CFX  . Diabetes mellitus without complication   . Hypertension   . CHF (congestive heart failure)     Past Surgical History  Procedure Laterality Date  . Appendectomy    . Cesarean section    . Cardiac catheterization N/A 06/17/2015    Procedure: Left Heart Cath and Coronary Angiography;  Surgeon: Jettie Booze, MD;  Location: [facility];  Service: Cardiovascular;  Laterality: N/A;  . Cardiac catheterization  06/17/2015    Procedure: Coronary Stent Intervention;  Surgeon: Jettie Booze, MD;  Location: [facility];  Service: Cardiovascular;;     Current Outpatient Prescriptions  Medication Sig Dispense Refill  . aspirin 81 MG tablet Take 1 tablet (81 mg total) by mouth once. 90 tablet 3  .  atorvastatin (LIPITOR) 80 MG tablet Take 1 tablet (80 mg total) by mouth daily at 6 PM. 90 tablet 3  . dicyclomine (BENTYL) 20 MG tablet Take 1 tablet (20 mg total) by mouth 2 (two) times daily. 20 tablet 0  . lisinopril (PRINIVIL,ZESTRIL) 2.5 MG tablet Take 1 tablet (2.5 mg total) by mouth daily. 90 tablet 3  . metFORMIN (GLUCOPHAGE) 500 MG tablet Take 1 tablet (500 mg total) by mouth 2 (two) times daily with a meal. 180 tablet 3  . metoprolol tartrate (LOPRESSOR) 25 MG tablet Take 1 tablet (25 mg total) by mouth 2 (two) times daily. 180 tablet 3  . nitroGLYCERIN (NITROSTAT) 0.4 MG SL tablet Place 1 tablet (0.4 mg total) under the tongue every 5 (five) minutes as needed for chest pain. 25 tablet 2  . omeprazole (PRILOSEC) 20 MG capsule Take 1 capsule (20 mg total) by mouth daily. 30 capsule 11  . ticagrelor (BRILINTA) 90 MG TABS tablet Take 1 tablet (90 mg total) by mouth 2 (two) times daily. 60 tablet 3   No current facility-administered medications for this visit.    Allergies:   Review of patient's allergies indicates no known allergies.    Social History:  The patient  reports that  she has never smoked. She has never used smokeless tobacco. She reports that she does not drink alcohol or use illicit drugs.   Family History:  The patient's family history includes Asthma in her paternal uncle; Cancer in her father; Heart attack in her brother and maternal uncle; Hyperlipidemia in her sister; Hypertension in her brother and sister; Stroke in her mother. There is no history of Thyroid disease.    ROS:  Please see the history of present illness.   Otherwise, review of systems are positive for vaginal bleeding.   All other systems are reviewed and negative.    PHYSICAL EXAM: VS:  BP 118/82 mmHg  Pulse 60  Ht 5\' 6"  (1.676 m)  Wt 183 lb (83.008 kg)  BMI 29.55 kg/m2 , BMI Body mass index is 29.55 kg/(m^2). GEN: Well nourished, well developed, in no acute distress HEENT: normal Neck: no JVD,  carotid bruits, or masses Cardiac: RRR; no murmurs, rubs, or gallops,no edema  Respiratory:  clear to auscultation bilaterally, normal work of breathing GI: soft, nontender, nondistended, + BS MS: no deformity or atrophy Skin: warm and dry, no rash Neuro:  Strength and sensation are intact Psych: euthymic mood, full affect   Recent Labs: 06/17/2015: TSH 0.963 06/29/2015: ALT 15; BUN 11; Creatinine, Ser 1.18*; Hemoglobin 13.0; Platelets 239; Potassium 4.2; Sodium 137   Lipid Panel    Component Value Date/Time   CHOL 248* 06/17/2015 0631   TRIG 162* 06/17/2015 0631   HDL 45 06/17/2015 0631   CHOLHDL 5.5 06/17/2015 0631   VLDL 32 06/17/2015 0631   LDLCALC 171* 06/17/2015 0631     Other studies Reviewed: Additional studies/ records that were reviewed today with results demonstrating: Cath report shows two bare metal stents to the mid circumflex.   ASSESSMENT AND PLAN:  1. CAD/Old MI: Lateral wall MI in 8/16.  No CHF symptoms. She is only sleeping on 2 pillows. No signs of fluid overload on exam. Continue dual antiplatelet therapy. Vaginal bleeding issues concerning. We'll have to seek further evaluation. Since she had bare-metal stents, could consider decreasing antiplatelet therapy. She has completed one-month already. Will await GYN consultation. 2. Vaginal bleeding: If needed, could stop aspirin and continue Brilinta. If this is thought to be secondary to medication, could also switch Brilinta to clopidogrel which they have less associated bleeding. Await result of GYN consultation. 3. Hyperlipidemia: Check lipids after she has been on the atorvastatin for at least 6 weeks. This will be at the end of September. LDL was very high and well above target at the time of her MI. 4. DM: Followed by new PMD.     Current medicines are reviewed at length with the patient today.  The patient concerns regarding her medicines were addressed.  The following changes have been made:  No  change  Labs/ tests ordered today include: Lipids already ordered  No orders of the defined types were placed in this encounter.    Recommend 150 minutes/week of aerobic exercise Low fat, low carb, high fiber diet recommended  Disposition:   FU in 3 months   Teresita Madura., MD  07/24/2015 9:26 AM    [facility] [address], [place], [place]  [address] Phone: [phone]; Fax: [phone]

## 2015-07-24 NOTE — Patient Instructions (Signed)
Medication Instructions:  Same-no changes  Labwork: In 2 months (Lipids and CMET). Please do not eat or drink after midnight there night before labs are drawn.  Testing/Procedures: None  Follow-Up: Your physician wants you to follow-up in: 3 months. You will receive a reminder letter in the mail two months in advance. If you don't receive a letter, please call our office to schedule the follow-up appointment.  Dr Irish Lack is referring you to an OB-GYN.

## 2015-07-24 NOTE — Telephone Encounter (Signed)
Patient care coordinator called stating that patient is on a blood thinner and that her Cardiologist stated that patient will need to see an OBGYN, Patient holds an orange card, and needs a referral to see an OBG YN  Please follow up

## 2015-07-24 NOTE — Telephone Encounter (Signed)
Good Afternoon  The referral is already place from the cardiologist office today to [facility]  Pt has to wait for them to contact her . Please , see referrals in the patient chart thank you

## 2015-07-29 ENCOUNTER — Other Ambulatory Visit (INDEPENDENT_AMBULATORY_CARE_PROVIDER_SITE_OTHER): Payer: No Typology Code available for payment source | Admitting: *Deleted

## 2015-07-29 ENCOUNTER — Other Ambulatory Visit: Payer: Self-pay | Admitting: Cardiology

## 2015-07-29 DIAGNOSIS — I2510 Atherosclerotic heart disease of native coronary artery without angina pectoris: Secondary | ICD-10-CM

## 2015-07-29 DIAGNOSIS — Z9861 Coronary angioplasty status: Secondary | ICD-10-CM

## 2015-07-29 DIAGNOSIS — E785 Hyperlipidemia, unspecified: Secondary | ICD-10-CM

## 2015-07-29 LAB — COMPREHENSIVE METABOLIC PANEL
ALT: 18 U/L (ref 0–35)
AST: 14 U/L (ref 0–37)
Albumin: 4.1 g/dL (ref 3.5–5.2)
Alkaline Phosphatase: 85 U/L (ref 39–117)
BUN: 12 mg/dL (ref 6–23)
CO2: 29 mEq/L (ref 19–32)
Calcium: 9.4 mg/dL (ref 8.4–10.5)
Chloride: 107 mEq/L (ref 96–112)
Creatinine, Ser: 1.01 mg/dL (ref 0.40–1.20)
GFR: 73.08 mL/min (ref >60.00)
Glucose, Bld: 104 mg/dL — ABNORMAL HIGH (ref 70–99)
Potassium: 4.1 mEq/L (ref 3.5–5.1)
Sodium: 140 mEq/L (ref 135–145)
Total Bilirubin: 0.5 mg/dL (ref 0.2–1.2)
Total Protein: 7.4 g/dL (ref 6.0–8.3)

## 2015-07-29 LAB — LIPID PANEL
Cholesterol: 89 mg/dL (ref 0–200)
HDL: 30 mg/dL — ABNORMAL LOW (ref >39.00)
LDL Cholesterol: 46 mg/dL (ref 0–99)
NonHDL: 59.23
Total CHOL/HDL Ratio: 3
Triglycerides: 64 mg/dL (ref 0.0–149.0)
VLDL: 12.8 mg/dL (ref 0.0–40.0)

## 2015-08-04 ENCOUNTER — Telehealth: Payer: Self-pay | Admitting: Interventional Cardiology

## 2015-08-04 NOTE — Telephone Encounter (Signed)
New problem    Pt returning call concerning lab work

## 2015-08-05 ENCOUNTER — Telehealth: Payer: Self-pay | Admitting: *Deleted

## 2015-08-05 DIAGNOSIS — Z09 Encounter for follow-up examination after completed treatment for conditions other than malignant neoplasm: Principal | ICD-10-CM

## 2015-08-05 DIAGNOSIS — E785 Hyperlipidemia, unspecified: Secondary | ICD-10-CM

## 2015-08-05 DIAGNOSIS — I252 Old myocardial infarction: Secondary | ICD-10-CM

## 2015-08-05 MED ORDER — ATORVASTATIN CALCIUM 80 MG PO TABS: 40.0000 mg | ORAL_TABLET | Freq: Every day | ORAL | Status: DC

## 2015-08-05 NOTE — Telephone Encounter (Signed)
-----   Message from Erlene Quan, [place] sent at 07/29/2015 12:57 PM EDT ----- Dramatic reduction in LDL and HDL. I would suggest decreasing Lipitor to 40 mg daily and re check lipids in 6 weeks.  Kerin Ransom PA-C 07/29/2015 12:57 PM

## 2015-08-05 NOTE — Telephone Encounter (Signed)
Patient voiced understanding to cut lipitor in 1/2. Lab orders mailed to the pt.

## 2015-08-07 ENCOUNTER — Other Ambulatory Visit: Payer: Self-pay | Admitting: Obstetrics & Gynecology

## 2015-08-07 DIAGNOSIS — N939 Abnormal uterine and vaginal bleeding, unspecified: Secondary | ICD-10-CM

## 2015-08-10 ENCOUNTER — Encounter: Payer: Self-pay | Admitting: Obstetrics & Gynecology

## 2015-08-20 ENCOUNTER — Telehealth: Payer: Self-pay | Admitting: *Deleted

## 2015-08-20 NOTE — Telephone Encounter (Signed)
I spoke to patient about the Virginia [name] Gene study and asked her if she is willing to participate. Patient stated she did not want to participate because they are already adjusting medications and does not feel it would be wise. I thanked her for willingness to participate while in hospital. Patient did have genotype but patient did not want to proceed.

## 2015-08-27 ENCOUNTER — Encounter: Payer: No Typology Code available for payment source | Admitting: Family

## 2015-09-10 ENCOUNTER — Ambulatory Visit (INDEPENDENT_AMBULATORY_CARE_PROVIDER_SITE_OTHER): Payer: Self-pay | Admitting: Obstetrics & Gynecology

## 2015-09-10 ENCOUNTER — Encounter: Payer: Self-pay | Admitting: Obstetrics & Gynecology

## 2015-09-10 VITALS — BP 123/82 | HR 86 | Ht 66.0 in | Wt 173.8 lb

## 2015-09-10 DIAGNOSIS — D259 Leiomyoma of uterus, unspecified: Secondary | ICD-10-CM

## 2015-09-10 DIAGNOSIS — N950 Postmenopausal bleeding: Secondary | ICD-10-CM

## 2015-09-10 DIAGNOSIS — Z113 Encounter for screening for infections with a predominantly sexual mode of transmission: Secondary | ICD-10-CM

## 2015-09-10 NOTE — Progress Notes (Signed)
Patient ID: Virginia [name], female   DOB: [birth year], 54 y.o.   MRN: [id number]  Cc: had vaginal bleeding for 7 days in Sept   HPI Virginia [name] is a 54 y.o. female.  [id number] Menopausal for 5-6 years, with bleeding episode 03/2014 and again 07/2015 for 7 days moderate flow. Has prior dx of fibroids and [place] was done 2011. States myomectomy for large fibroid was done 20 years ago at preterm [facility] by Dr Raphael Gibney.  HPI  Past Medical History  Diagnosis Date  . Hyperlipidemia with target LDL less than 70   . STEMI (ST elevation myocardial infarction) ([place]) 06/16/2015    BMS x 2 CFX  . Diabetes mellitus without complication ([place])   . Hypertension   . CHF (congestive heart failure) [facility])     Past Surgical History  Procedure Laterality Date  . Appendectomy    . Cesarean section    . Cardiac catheterization N/A 06/17/2015    Procedure: Left Heart Cath and Coronary Angiography;  Surgeon: Jettie Booze, MD;  Location: [place] [facility];  Service: Cardiovascular;  Laterality: N/A;  . Cardiac catheterization  06/17/2015    Procedure: Coronary Stent Intervention;  Surgeon: Jettie Booze, MD;  Location: [place] [facility];  Service: Cardiovascular;;    Family History  Problem Relation Age of Onset  . Stroke Mother   . Cancer Father   . Hypertension Sister   . Hyperlipidemia Sister   . Heart attack Brother   . Hypertension Brother   . Heart attack Maternal Uncle   . Asthma Paternal Uncle   . Thyroid disease Neg Hx     Social History Social History  Substance Use Topics  . Smoking status: Never Smoker   . Smokeless tobacco: Never Used  . Alcohol Use: No    No Known Allergies  Current Outpatient Prescriptions  Medication Sig Dispense Refill  . aspirin 81 MG tablet Take 1 tablet (81 mg total) by mouth once. 90 tablet 3  . atorvastatin (LIPITOR) 80 MG tablet Take 0.5 tablets (40 mg total) by mouth daily at 6 PM. 90 tablet 3  . lisinopril (PRINIVIL,ZESTRIL) 2.5 MG tablet Take 1  tablet (2.5 mg total) by mouth daily. 90 tablet 3  . metFORMIN (GLUCOPHAGE) 500 MG tablet Take 1 tablet (500 mg total) by mouth 2 (two) times daily with a meal. 180 tablet 3  . metoprolol tartrate (LOPRESSOR) 25 MG tablet Take 1 tablet (25 mg total) by mouth 2 (two) times daily. 180 tablet 3  . nitroGLYCERIN (NITROSTAT) 0.4 MG SL tablet Place 1 tablet (0.4 mg total) under the tongue every 5 (five) minutes as needed for chest pain. 25 tablet 2  . ticagrelor (BRILINTA) 90 MG TABS tablet Take 1 tablet (90 mg total) by mouth 2 (two) times daily. 60 tablet 3  . dicyclomine (BENTYL) 20 MG tablet Take 1 tablet (20 mg total) by mouth 2 (two) times daily. (Patient not taking: Reported on 09/10/2015) 20 tablet 0  . omeprazole (PRILOSEC) 20 MG capsule Take 1 capsule (20 mg total) by mouth daily. (Patient not taking: Reported on 09/10/2015) 30 capsule 11   No current facility-administered medications for this visit.    Review of Systems Review of Systems  Constitutional: Negative.   Gastrointestinal: Negative.   Genitourinary: Positive for menstrual problem. Negative for vaginal bleeding, vaginal discharge and pelvic pain.    Blood pressure 123/82, pulse 86, height 5\' 6"  (1.676 m), weight 173 lb 12.8 oz (78.835 kg).  Physical Exam Physical Exam  Constitutional: She is oriented to person, place, and time. She appears well-developed. No distress.  Pulmonary/Chest: Effort normal.  Abdominal: Soft. She exhibits no mass. There is no tenderness.  Genitourinary: Vaginal discharge (scant, wet prep and STD test done) found.  Uterus 10 weeks  Neurological: She is alert and oriented to person, place, and time.  Skin: Skin is warm and dry.  Psychiatric: She has a normal mood and affect. Her behavior is normal.    Data Reviewed Clinical Data: Palpable mass on pelvic exam. LMP 04/14/2010  TRANSABDOMINAL AND TRANSVAGINAL ULTRASOUND OF PELVIS  Technique: Both transabdominal and transvaginal  ultrasound examinations of the pelvis were performed including evaluation of the uterus, ovaries, adnexal regions, and pelvic cul-de-sac. Transabdominal technique was performed for global imaging of the pelvis. Transvaginal technique was performed for detailed evaluation of the endometrium and/or ovaries.  Comparison: None.  Findings:  Uterus the uterus is enlarged with a sagittal length exceeding 13 cm, an AP depth of approximately 7.5 cm and a transverse width of 12.0 cm. Multiple areas of focally altered echotexture are identified with the largest located in the right posterior midbody measuring 3.8 x 3.0 x 4.2 cm, in the left lateral lower uterine segment with a partial subserosal component measuring 5.3 x 4.1 x 4.2 cm, in the posterior left lower uterine segment with a partial subserosal component measuring 4.8 x 3.5 x 3.5 cm and in the right lateral lower uterine segment with a partial subserosal component measuring 4.6 x 3.5 x 3.4 cm.  Endometrium has a tri- layered appearance with an AP width measuring 8.6 mm. This would correlate with a periovulatory endometrial stripe and correspond with the patient's given LMP of 04/14/2010.  Right Ovary measures 3.9 x 2.1 x 2.3 cm and has a normal appearance transabdominally. This ovary is not visualized endovaginally. A dominant follicle is seen  Left Ovary measures 3.3 x 1.8 x 2.4 cm and has a normal transabdominal appearance. This ovary is not seen endovaginally.  Other Findings: No pelvic fluid or separate adnexal masses are seen.  IMPRESSION: Fibroid uterus with largest fibroid sizes and locations as noted above. The centrally positioned measured fibroid may have a small submucosal component while the remaining largest fibroids all have a partial subserosal component. If further evaluation of the relationship between the fibroids and endometrial canal is desired, MRI would be recommended as the  large uterine size is likely to preclude good evaluation by sonohysterography.  Normal periovulatory endometrial stripe and ovaries.  Provider: Juanetta Snow  Assessment    Fibroid uterus and postmenopausal bleeding     Plan    Repeat US RTC for possible endometrial  biopsy       ARNOLD,JAMES 09/10/2015, 1:52 PM

## 2015-09-10 NOTE — Patient Instructions (Signed)
Postmenopausal Bleeding Postmenopausal bleeding is any bleeding a woman has after she has entered into menopause. Menopause is the end of a woman's fertile years. After menopause, a woman no longer ovulates or has menstrual periods.  Postmenopausal bleeding can be caused by various things. Any type of postmenopausal bleeding, even if it appears to be a typical menstrual period, is concerning. This should be evaluated by your health care provider. Any treatment will depend on the cause of the bleeding. HOME CARE INSTRUCTIONS Monitor your condition for any changes. The following actions may help to alleviate any discomfort you are experiencing:  Avoid the use of tampons and douches as directed by your health care provider.  Change your pads frequently.  Get regular pelvic exams and Pap tests.  Keep all follow-up appointments for diagnostic tests as directed by your health care provider. SEEK MEDICAL CARE IF:   Your bleeding lasts more than 1 week.  You have abdominal pain.  You have bleeding with sexual intercourse. SEEK IMMEDIATE MEDICAL CARE IF:   You have a fever, chills, headache, dizziness, muscle aches, and bleeding.  You have severe pain with bleeding.  You are passing blood clots.  You have bleeding and need more than 1 pad an hour.  You feel faint. MAKE SURE YOU:  Understand these instructions.  Will watch your condition.  Will get help right away if you are not doing well or get worse.   This information is not intended to replace advice given to you by your health care provider. Make sure you discuss any questions you have with your health care provider.   Document Released: 02/01/2006 Document Revised: 08/14/2013 Document Reviewed: 05/23/2013 Elsevier Interactive Patient Education 2016 [employer]  

## 2015-09-11 LAB — WET PREP, GENITAL
Trich, Wet Prep: NONE SEEN
YEAST WET PREP: NONE SEEN

## 2015-09-14 ENCOUNTER — Telehealth: Payer: Self-pay | Admitting: General Practice

## 2015-09-14 DIAGNOSIS — B9689 Other specified bacterial agents as the cause of diseases classified elsewhere: Secondary | ICD-10-CM

## 2015-09-14 DIAGNOSIS — N760 Acute vaginitis: Principal | ICD-10-CM

## 2015-09-14 LAB — GC/CHLAMYDIA PROBE AMP (~~LOC~~) NOT AT ARMC
CHLAMYDIA, DNA PROBE: NEGATIVE
Neisseria Gonorrhea: NEGATIVE

## 2015-09-14 MED ORDER — METRONIDAZOLE 500 MG PO TABS: 500.0000 mg | ORAL_TABLET | Freq: Two times a day (BID) | ORAL | Status: DC

## 2015-09-14 NOTE — Telephone Encounter (Signed)
Patient has BV. Med sent to pharmacy. Called patient, no answer- left message stating we are trying to reach you with non urgent results, please call us back at the clinics

## 2015-09-15 NOTE — Telephone Encounter (Signed)
Called patient and informed her of results and medication sent to pharmacy. Patient verbalized understanding to all and is aware to avoid alcohol while on flagyl. Patient had no questions

## 2015-09-17 ENCOUNTER — Emergency Department (HOSPITAL_COMMUNITY): Payer: No Typology Code available for payment source

## 2015-09-17 ENCOUNTER — Encounter (HOSPITAL_COMMUNITY): Payer: Self-pay | Admitting: Emergency Medicine

## 2015-09-17 ENCOUNTER — Observation Stay (HOSPITAL_COMMUNITY)
Admission: EM | Admit: 2015-09-17 | Discharge: 2015-09-18 | Disposition: A | Discharge status: Home / Self Care | Payer: No Typology Code available for payment source | Attending: Interventional Cardiology | Admitting: Interventional Cardiology

## 2015-09-17 ENCOUNTER — Telehealth: Payer: Self-pay | Admitting: Interventional Cardiology

## 2015-09-17 DIAGNOSIS — Z955 Presence of coronary angioplasty implant and graft: Secondary | ICD-10-CM | POA: Insufficient documentation

## 2015-09-17 DIAGNOSIS — E1122 Type 2 diabetes mellitus with diabetic chronic kidney disease: Secondary | ICD-10-CM

## 2015-09-17 DIAGNOSIS — Z79899 Other long term (current) drug therapy: Secondary | ICD-10-CM | POA: Insufficient documentation

## 2015-09-17 DIAGNOSIS — E119 Type 2 diabetes mellitus without complications: Secondary | ICD-10-CM | POA: Diagnosis present

## 2015-09-17 DIAGNOSIS — R079 Chest pain, unspecified: Secondary | ICD-10-CM | POA: Diagnosis present

## 2015-09-17 DIAGNOSIS — Z7982 Long term (current) use of aspirin: Secondary | ICD-10-CM | POA: Insufficient documentation

## 2015-09-17 DIAGNOSIS — E785 Hyperlipidemia, unspecified: Secondary | ICD-10-CM

## 2015-09-17 DIAGNOSIS — I200 Unstable angina: Secondary | ICD-10-CM

## 2015-09-17 DIAGNOSIS — I130 Hypertensive heart and chronic kidney disease with heart failure and stage 1 through stage 4 chronic kidney disease, or unspecified chronic kidney disease: Secondary | ICD-10-CM | POA: Insufficient documentation

## 2015-09-17 DIAGNOSIS — Z23 Encounter for immunization: Secondary | ICD-10-CM | POA: Insufficient documentation

## 2015-09-17 DIAGNOSIS — I252 Old myocardial infarction: Secondary | ICD-10-CM | POA: Insufficient documentation

## 2015-09-17 DIAGNOSIS — Z7984 Long term (current) use of oral hypoglycemic drugs: Secondary | ICD-10-CM | POA: Insufficient documentation

## 2015-09-17 DIAGNOSIS — I2510 Atherosclerotic heart disease of native coronary artery without angina pectoris: Secondary | ICD-10-CM

## 2015-09-17 DIAGNOSIS — Z7902 Long term (current) use of antithrombotics/antiplatelets: Secondary | ICD-10-CM | POA: Insufficient documentation

## 2015-09-17 DIAGNOSIS — R0789 Other chest pain: Principal | ICD-10-CM | POA: Insufficient documentation

## 2015-09-17 DIAGNOSIS — N289 Disorder of kidney and ureter, unspecified: Secondary | ICD-10-CM | POA: Diagnosis present

## 2015-09-17 DIAGNOSIS — Z9861 Coronary angioplasty status: Secondary | ICD-10-CM

## 2015-09-17 DIAGNOSIS — I509 Heart failure, unspecified: Secondary | ICD-10-CM | POA: Insufficient documentation

## 2015-09-17 DIAGNOSIS — N182 Chronic kidney disease, stage 2 (mild): Secondary | ICD-10-CM | POA: Insufficient documentation

## 2015-09-17 HISTORY — DX: Atherosclerotic heart disease of native coronary artery without angina pectoris: I25.10

## 2015-09-17 LAB — COMPREHENSIVE METABOLIC PANEL
ALBUMIN: 3.5 g/dL (ref 3.5–5.0)
ALT: 14 U/L (ref 14–54)
ANION GAP: 8 (ref 5–15)
AST: 15 U/L (ref 15–41)
Alkaline Phosphatase: 87 U/L (ref 38–126)
BILIRUBIN TOTAL: 0.5 mg/dL (ref 0.3–1.2)
BUN: 6 mg/dL (ref 6–20)
CO2: 26 mmol/L (ref 22–32)
Calcium: 9.2 mg/dL (ref 8.9–10.3)
Chloride: 103 mmol/L (ref 101–111)
Creatinine, Ser: 1.06 mg/dL — ABNORMAL HIGH (ref 0.44–1.00)
GFR calc Af Amer: >60 mL/min (ref >60)
GFR calc non Af Amer: 58 mL/min — ABNORMAL LOW (ref >60)
GLUCOSE: 103 mg/dL — AB (ref 65–99)
POTASSIUM: 4.1 mmol/L (ref 3.5–5.1)
Sodium: 137 mmol/L (ref 135–145)
TOTAL PROTEIN: 6.7 g/dL (ref 6.5–8.1)

## 2015-09-17 LAB — CBC WITH DIFFERENTIAL/PLATELET
BASOS ABS: 0 10*3/uL (ref 0.0–0.1)
Basophils Relative: 0 %
Eosinophils Absolute: 0.3 10*3/uL (ref 0.0–0.7)
Eosinophils Relative: 6 %
HEMATOCRIT: 33.8 % — AB (ref 36.0–46.0)
HEMOGLOBIN: 10.8 g/dL — AB (ref 12.0–15.0)
LYMPHS PCT: 39 %
Lymphs Abs: 2.1 10*3/uL (ref 0.7–4.0)
MCH: 28.9 pg (ref 26.0–34.0)
MCHC: 32 g/dL (ref 30.0–36.0)
MCV: 90.4 fL (ref 78.0–100.0)
MONO ABS: 0.3 10*3/uL (ref 0.1–1.0)
Monocytes Relative: 6 %
NEUTROS ABS: 2.7 10*3/uL (ref 1.7–7.7)
Neutrophils Relative %: 49 %
Platelets: 221 10*3/uL (ref 150–400)
RBC: 3.74 MIL/uL — AB (ref 3.87–5.11)
RDW: 14.3 % (ref 11.5–15.5)
WBC: 5.5 10*3/uL (ref 4.0–10.5)

## 2015-09-17 LAB — I-STAT TROPONIN, ED
TROPONIN I, POC: 0 ng/mL (ref 0.00–0.08)
Troponin i, poc: 0.01 ng/mL (ref 0.00–0.08)

## 2015-09-17 LAB — GLUCOSE, CAPILLARY: GLUCOSE-CAPILLARY: 93 mg/dL (ref 65–99)

## 2015-09-17 LAB — BRAIN NATRIURETIC PEPTIDE: B NATRIURETIC PEPTIDE 5: 62.9 pg/mL (ref 0.0–100.0)

## 2015-09-17 MED ORDER — METRONIDAZOLE 500 MG PO TABS
500.0000 mg | ORAL_TABLET | Freq: Two times a day (BID) | ORAL | Status: DC
  Administered 2015-09-17 – 2015-09-18 (×3): 500 mg via ORAL
  Filled 2015-09-17 (×3): qty 1

## 2015-09-17 MED ORDER — ASPIRIN EC 81 MG PO TBEC
81.0000 mg | DELAYED_RELEASE_TABLET | Freq: Every day | ORAL | Status: DC
  Administered 2015-09-18: 81 mg via ORAL
  Filled 2015-09-17: qty 1

## 2015-09-17 MED ORDER — LISINOPRIL 2.5 MG PO TABS
2.5000 mg | ORAL_TABLET | Freq: Every day | ORAL | Status: DC
  Administered 2015-09-18: 2.5 mg via ORAL
  Filled 2015-09-17: qty 1

## 2015-09-17 MED ORDER — ASPIRIN EC 81 MG PO TBEC: 81.0000 mg | DELAYED_RELEASE_TABLET | Freq: Every day | ORAL | Status: DC

## 2015-09-17 MED ORDER — ACETAMINOPHEN 325 MG PO TABS
650.0000 mg | ORAL_TABLET | ORAL | Status: DC | PRN
  Administered 2015-09-17: 650 mg via ORAL
  Filled 2015-09-17: qty 2

## 2015-09-17 MED ORDER — NITROGLYCERIN 0.4 MG SL SUBL: 0.4000 mg | SUBLINGUAL_TABLET | SUBLINGUAL | Status: DC | PRN

## 2015-09-17 MED ORDER — ONDANSETRON HCL 4 MG/2ML IJ SOLN: 4.0000 mg | Freq: Four times a day (QID) | INTRAMUSCULAR | Status: DC | PRN

## 2015-09-17 MED ORDER — GI COCKTAIL ~~LOC~~
30.0000 mL | Freq: Once | ORAL | Status: AC
  Administered 2015-09-17: 30 mL via ORAL
  Filled 2015-09-17: qty 30

## 2015-09-17 MED ORDER — ASPIRIN 81 MG PO CHEW
324.0000 mg | CHEWABLE_TABLET | Freq: Once | ORAL | Status: AC
  Administered 2015-09-17: 324 mg via ORAL
  Filled 2015-09-17: qty 4

## 2015-09-17 MED ORDER — TICAGRELOR 90 MG PO TABS
90.0000 mg | ORAL_TABLET | Freq: Two times a day (BID) | ORAL | Status: DC
  Administered 2015-09-17 – 2015-09-18 (×3): 90 mg via ORAL
  Filled 2015-09-17 (×3): qty 1

## 2015-09-17 MED ORDER — METOPROLOL TARTRATE 25 MG PO TABS
25.0000 mg | ORAL_TABLET | Freq: Two times a day (BID) | ORAL | Status: DC
  Administered 2015-09-18: 25 mg via ORAL
  Filled 2015-09-17: qty 1

## 2015-09-17 MED ORDER — ATORVASTATIN CALCIUM 40 MG PO TABS
40.0000 mg | ORAL_TABLET | Freq: Every day | ORAL | Status: DC
  Administered 2015-09-17: 40 mg via ORAL
  Filled 2015-09-17: qty 1

## 2015-09-17 NOTE — ED Notes (Addendum)
Patient transported to xray without distress.

## 2015-09-17 NOTE — ED Notes (Signed)
Attempted to call report

## 2015-09-17 NOTE — ED Provider Notes (Signed)
CSN: [id number]     Arrival date & time 09/17/15  1016 History   First MD Initiated Contact with Patient 09/17/15 1020     Chief Complaint  Patient presents with  . Shortness of Breath     (Consider location/radiation/quality/duration/timing/severity/associated sxs/prior Treatment) Patient is a 54 y.o. female presenting with chest pain.  Chest Pain Pain location:  Substernal area Pain quality: pressure   Pain radiates to:  Does not radiate Pain radiates to the back: no   Pain severity:  Moderate Onset quality:  Gradual Duration:  1 day Timing:  Constant Progression:  Unchanged Chronicity:  Recurrent Context comment:  MI 3 months ago Relieved by:  Nothing Worsened by:  Nothing tried Ineffective treatments:  None tried Associated symptoms: shortness of breath   Associated symptoms: no abdominal pain, no nausea and not vomiting     Past Medical History  Diagnosis Date  . Hyperlipidemia with target LDL less than 70   . STEMI (ST elevation myocardial infarction) ([place]) 06/16/2015    BMS x 2 CFX  . Diabetes mellitus without complication ([place])   . Hypertension   . CHF (congestive heart failure) [facility])    Past Surgical History  Procedure Laterality Date  . Appendectomy    . Cesarean section    . Cardiac catheterization N/A 06/17/2015    Procedure: Left Heart Cath and Coronary Angiography;  Surgeon: Jettie Booze, MD;  Location: [place] [facility];  Service: Cardiovascular;  Laterality: N/A;  . Cardiac catheterization  06/17/2015    Procedure: Coronary Stent Intervention;  Surgeon: Jettie Booze, MD;  Location: [place] [facility];  Service: Cardiovascular;;   Family History  Problem Relation Age of Onset  . Stroke Mother   . Cancer Father   . Hypertension Sister   . Hyperlipidemia Sister   . Heart attack Brother   . Hypertension Brother   . Heart attack Maternal Uncle   . Asthma Paternal Uncle   . Thyroid disease Neg Hx    Social History  Substance Use  Topics  . Smoking status: Never Smoker   . Smokeless tobacco: Never Used  . Alcohol Use: No   OB History    Gravida Para Term Preterm AB TAB SAB Ectopic Multiple Living   2 2 2       2      Review of Systems  Respiratory: Positive for shortness of breath.   Cardiovascular: Positive for chest pain.  Gastrointestinal: Negative for nausea, vomiting and abdominal pain.  All other systems reviewed and are negative.     Allergies  Review of patient's allergies indicates no known allergies.  Home Medications   Prior to Admission medications   Medication Sig Start Date End Date Taking? Authorizing Provider  aspirin EC 81 MG tablet Take 81 mg by mouth daily.   Yes Historical Provider, MD  atorvastatin (LIPITOR) 80 MG tablet Take 0.5 tablets (40 mg total) by mouth daily at 6 PM. 08/05/15  Yes Erlene Quan, PA-C  lisinopril (PRINIVIL,ZESTRIL) 2.5 MG tablet Take 1 tablet (2.5 mg total) by mouth daily. 06/23/15  Yes Tresa Garter, MD  metFORMIN (GLUCOPHAGE) 500 MG tablet Take 1 tablet (500 mg total) by mouth 2 (two) times daily with a meal. 06/23/15  Yes Olugbemiga E Doreene Burke, MD  metoprolol tartrate (LOPRESSOR) 25 MG tablet Take 1 tablet (25 mg total) by mouth 2 (two) times daily. 06/23/15  Yes Tresa Garter, MD  metroNIDAZOLE (FLAGYL) 500 MG tablet Take 1 tablet (500  mg total) by mouth 2 (two) times daily. Patient taking differently: Take 500 mg by mouth 2 (two) times daily. For 7 days.  Started 09/17/15 09/14/15  Yes Tanna Savoy Stinson, DO  nitroGLYCERIN (NITROSTAT) 0.4 MG SL tablet Place 1 tablet (0.4 mg total) under the tongue every 5 (five) minutes as needed for chest pain. 06/19/15  Yes Brittainy Erie Noe, PA-C  ticagrelor (BRILINTA) 90 MG TABS tablet Take 1 tablet (90 mg total) by mouth 2 (two) times daily. 06/23/15  Yes Tresa Garter, MD  aspirin 81 MG tablet Take 1 tablet (81 mg total) by mouth once. Patient not taking: Reported on 09/17/2015 06/23/15   Tresa Garter, MD   dicyclomine (BENTYL) 20 MG tablet Take 1 tablet (20 mg total) by mouth 2 (two) times daily. Patient not taking: Reported on 09/10/2015 06/29/15   Orpah Greek, MD  omeprazole (PRILOSEC) 20 MG capsule Take 1 capsule (20 mg total) by mouth daily. Patient not taking: Reported on 09/10/2015 07/01/15   Doreene Burke Kilroy, PA-C   BP 149/76 mmHg  Pulse 68  Temp(Src) 98.2 F (36.8 C)  Resp 19  SpO2 100% Physical Exam  Constitutional: She is oriented to person, place, and time. She appears well-developed and well-nourished.  HENT:  Head: Normocephalic and atraumatic.  Right Ear: External ear normal.  Left Ear: External ear normal.  Eyes: Conjunctivae and EOM are normal. Pupils are equal, round, and reactive to light.  Neck: Normal range of motion. Neck supple.  Cardiovascular: Normal rate, regular rhythm, normal heart sounds and intact distal pulses.   Pulmonary/Chest: Effort normal and breath sounds normal.  Abdominal: Soft. Bowel sounds are normal. There is no tenderness.  Musculoskeletal: Normal range of motion.       Right lower leg: She exhibits no swelling.       Left lower leg: She exhibits no swelling.  Neurological: She is alert and oriented to person, place, and time.  Skin: Skin is warm and dry.  Vitals reviewed.   ED Course  Procedures (including critical care time) Labs Review Labs Reviewed  COMPREHENSIVE METABOLIC PANEL - Abnormal; Notable for the following:    Glucose, Bld 103 (*)    Creatinine, Ser 1.06 (*)    GFR calc non Af Amer 58 (*)    All other components within normal limits  CBC WITH DIFFERENTIAL/PLATELET - Abnormal; Notable for the following:    RBC 3.74 (*)    Hemoglobin 10.8 (*)    HCT 33.8 (*)    All other components within normal limits  BRAIN NATRIURETIC PEPTIDE  I-STAT TROPOININ, ED  I-STAT TROPOININ, ED  I-STAT TROPOININ, ED    Imaging Review Dg Chest 2 View  09/17/2015  CLINICAL DATA:  Shortness of breath and chest tightness for 1 day  EXAM: CHEST  2 VIEW COMPARISON:  December 20, 2011 FINDINGS: There is no edema or consolidation. The heart size and pulmonary vascularity are normal. No adenopathy. No pneumothorax. No bone lesions. IMPRESSION: No edema or consolidation. Electronically Signed   By: Lowella Grip III M.D.   On: 09/17/2015 11:12   I have personally reviewed and evaluated these images and lab results as part of my medical decision-making.   EKG Interpretation   Date/Time:  Thursday September 17 2015 10:17:16 EST Ventricular Rate:  79 PR Interval:  142 QRS Duration: 68 QT Interval:  372 QTC Calculation: 426 R Axis:   78 Text Interpretation:  Normal sinus rhythm Low voltage QRS Nonspecific T  wave  abnormality Abnormal ECG No significant change since last tracing  Confirmed by Debby Freiberg [phone]) on 09/17/2015 10:32:24 AM      MDM   Final diagnoses:  None    54 y.o. female with pertinent PMH of CAD, prior STEMI, CHF, HTN, HLD presents with dyspnea, chest pain.  States similar to prior symptoms leading to BMS stent.  Physical exam today benign.  Pt received ASA and nitro PTA.  On my exam pain greatly relieved, uncertain if due to nitro or not.    Wu unremarkable.  Consulted cardiology.    I have reviewed all laboratory and imaging studies if ordered as above  No diagnosis found.      Debby Freiberg, MD 09/17/15 [phone]

## 2015-09-17 NOTE — Telephone Encounter (Signed)
New message     Pt c/o of Chest Pain: STAT if CP now or developed within 24 hours  1. Are you having CP right now? Yes (chest pressure)  2. Are you experiencing any other symptoms (ex. SOB, nausea, vomiting, sweating)? Sob now 3. How long have you been experiencing CP? Started last night  4. Is your CP continuous or coming and going?  Comes and go  5. Have you taken Nitroglycerin?  no ?

## 2015-09-17 NOTE — ED Notes (Signed)
Attempted to give report 

## 2015-09-17 NOTE — ED Notes (Signed)
Patient transported to X-ray without distress. Phleb called to get rest of bloodwork; RN unable to get rest of samples off line

## 2015-09-17 NOTE — Telephone Encounter (Signed)
Returned patient's call.  Patient stated that she has been having sob, heart palpatations, chest pressure and a strange bubbly feeling in her chest.  Symptoms began last night.  She said that she had also had feet and leg swelling several days ago but that it is now gone.  Stated that she hadn't taken any nitro yet.  She stated that these are the same symptoms that she had and ignored prior to her MI in Aug/2016.  Patient asked if she needed to call 911.  Advised pt to take one nitro and can take another in 5 mins if needed and to call 911 and be transported to [facility].  Patient expressed understanding.  [facility] notified.

## 2015-09-17 NOTE — ED Notes (Signed)
Pt made aware of bed assignment 

## 2015-09-17 NOTE — H&P (Signed)
Cardiologist: Dr. Wende Bushy Virginia [name] is an 54 y.o. female.   Chief Complaint: Chest Pain HPI:   The patient is a 54 yo obese female with a history of CAD, STEMI 06/2015, DM, HTN, HLD.  Cardiac cath 06/17/15: 100% occluded Crx.  Two overlapping BMS placed.  50 % D1.  Minimal disease else where.  EF 60-65%, Mild MR.   She had problems with vaginal bleeding at her last office visit in Sept. ` She presents with CP, "Pressure", SOB and palpitations.  SOB started last night with orthopnea and CP this morning.  4/10 in intensity.  No radiation.  Some lower extremity edema which improved. The patient currently denies nausea, vomiting, fever, dizziness, PND,  congestion, abdominal pain, hematochezia, melena, claudication.      Medication Sig  aspirin EC 81 MG tablet Take 81 mg by mouth daily.  atorvastatin (LIPITOR) 80 MG tablet Take 0.5 tablets (40 mg total) by mouth daily at 6 PM.  lisinopril (PRINIVIL,ZESTRIL) 2.5 MG tablet Take 1 tablet (2.5 mg total) by mouth daily.  metFORMIN (GLUCOPHAGE) 500 MG tablet Take 1 tablet (500 mg total) by mouth 2 (two) times daily with a meal.  metoprolol tartrate (LOPRESSOR) 25 MG tablet Take 1 tablet (25 mg total) by mouth 2 (two) times daily.  metroNIDAZOLE (FLAGYL) 500 MG tablet Take 1 tablet (500 mg total) by mouth 2 (two) times daily. Patient taking differently: Take 500 mg by mouth 2 (two) times daily. For 7 days.  Started 09/17/15  nitroGLYCERIN (NITROSTAT) 0.4 MG SL tablet Place 1 tablet (0.4 mg total) under the tongue every 5 (five) minutes as needed for chest pain.  ticagrelor (BRILINTA) 90 MG TABS tablet Take 1 tablet (90 mg total) by mouth 2 (two) times daily.  aspirin 81 MG tablet Take 1 tablet (81 mg total) by mouth once. Patient not taking: Reported on 09/17/2015  dicyclomine (BENTYL) 20 MG tablet Take 1 tablet (20 mg total) by mouth 2 (two) times daily. Patient not taking: Reported on 09/10/2015  omeprazole (PRILOSEC) 20 MG capsule Take 1  capsule (20 mg total) by mouth daily. Patient not taking: Reported on 09/10/2015     Past Medical History  Diagnosis Date  . Hyperlipidemia with target LDL less than 70   . STEMI (ST elevation myocardial infarction) ([place]) 06/16/2015    BMS x 2 CFX  . Diabetes mellitus without complication ([place])   . Hypertension   . CHF (congestive heart failure) [facility])     Past Surgical History  Procedure Laterality Date  . Appendectomy    . Cesarean section    . Cardiac catheterization N/A 06/17/2015    Procedure: Left Heart Cath and Coronary Angiography;  Surgeon: Jettie Booze, MD;  Location: [place] [facility];  Service: Cardiovascular;  Laterality: N/A;  . Cardiac catheterization  06/17/2015    Procedure: Coronary Stent Intervention;  Surgeon: Jettie Booze, MD;  Location: [place] [facility];  Service: Cardiovascular;;    Family History  Problem Relation Age of Onset  . Stroke Mother   . Cancer Father   . Hypertension Sister   . Hyperlipidemia Sister   . Heart attack Brother   . Hypertension Brother   . Heart attack Maternal Uncle   . Asthma Paternal Uncle   . Thyroid disease Neg Hx    Social History:  reports that she has never smoked. She has never used smokeless tobacco. She reports that she does not drink alcohol or use illicit drugs.  Allergies: No Known Allergies   (Not in a hospital admission)  Results for orders placed or performed during the hospital encounter of 09/17/15 (from the past 48 hour(s))  Comprehensive metabolic panel     Status: Abnormal   Collection Time: 09/17/15 11:12 AM  Result Value Ref Range   Sodium 137 135 - 145 mmol/L   Potassium 4.1 3.5 - 5.1 mmol/L   Chloride 103 101 - 111 mmol/L   CO2 26 22 - 32 mmol/L   Glucose, Bld 103 (H) 65 - 99 mg/dL   BUN 6 6 - 20 mg/dL   Creatinine, Ser 1.06 (H) 0.44 - 1.00 mg/dL   Calcium 9.2 8.9 - 10.3 mg/dL   Total Protein 6.7 6.5 - 8.1 g/dL   Albumin 3.5 3.5 - 5.0 g/dL   AST 15 15 - 41 U/L   ALT 14 14  - 54 U/L   Alkaline Phosphatase 87 38 - 126 U/L   Total Bilirubin 0.5 0.3 - 1.2 mg/dL   GFR calc non Af Amer 58 (L) >60 mL/min   GFR calc Af Amer >60 >60 mL/min    Comment: (NOTE) The eGFR has been calculated using the CKD EPI equation. This calculation has not been validated in all clinical situations. eGFR's persistently <60 mL/min signify possible Chronic Kidney Disease.    Anion gap 8 5 - 15  CBC with Differential     Status: Abnormal   Collection Time: 09/17/15 11:12 AM  Result Value Ref Range   WBC 5.5 4.0 - 10.5 K/uL   RBC 3.74 (L) 3.87 - 5.11 MIL/uL   Hemoglobin 10.8 (L) 12.0 - 15.0 g/dL   HCT 33.8 (L) 36.0 - 46.0 %   MCV 90.4 78.0 - 100.0 fL   MCH 28.9 26.0 - 34.0 pg   MCHC 32.0 30.0 - 36.0 g/dL   RDW 14.3 11.5 - 15.5 %   Platelets 221 150 - 400 K/uL   Neutrophils Relative % 49 %   Neutro Abs 2.7 1.7 - 7.7 K/uL   Lymphocytes Relative 39 %   Lymphs Abs 2.1 0.7 - 4.0 K/uL   Monocytes Relative 6 %   Monocytes Absolute 0.3 0.1 - 1.0 K/uL   Eosinophils Relative 6 %   Eosinophils Absolute 0.3 0.0 - 0.7 K/uL   Basophils Relative 0 %   Basophils Absolute 0.0 0.0 - 0.1 K/uL  Brain natriuretic peptide     Status: None   Collection Time: 09/17/15 11:12 AM  Result Value Ref Range   B Natriuretic Peptide 62.9 0.0 - 100.0 pg/mL  I-Stat Troponin, ED (not at [facility])     Status: None   Collection Time: 09/17/15 11:35 AM  Result Value Ref Range   Troponin i, poc 0.01 0.00 - 0.08 ng/mL   Comment 3            Comment: Due to the release kinetics of cTnI, a negative result within the first hours of the onset of symptoms does not rule out myocardial infarction with certainty. If myocardial infarction is still suspected, repeat the test at appropriate intervals.   I-stat troponin, ED     Status: None   Collection Time: 09/17/15  3:11 PM  Result Value Ref Range   Troponin i, poc 0.00 0.00 - 0.08 ng/mL   Comment 3            Comment: Due to the release kinetics of cTnI, a  negative result within the first hours of the onset of symptoms does  not rule out myocardial infarction with certainty. If myocardial infarction is still suspected, repeat the test at appropriate intervals.    Dg Chest 2 View  09/17/2015  CLINICAL DATA:  Shortness of breath and chest tightness for 1 day EXAM: CHEST  2 VIEW COMPARISON:  December 20, 2011 FINDINGS: There is no edema or consolidation. The heart size and pulmonary vascularity are normal. No adenopathy. No pneumothorax. No bone lesions. IMPRESSION: No edema or consolidation. Electronically Signed   By: Lowella Grip III M.D.   On: 09/17/2015 11:12    Review of Systems  Constitutional: Negative for fever and diaphoresis.  HENT: Negative for congestion.   Respiratory: Positive for cough and shortness of breath.   Cardiovascular: Positive for chest pain, palpitations, orthopnea and leg swelling. Negative for PND.  Gastrointestinal: Negative for nausea, vomiting, abdominal pain, blood in stool and melena.  Genitourinary: Negative for hematuria.  Musculoskeletal: Negative for myalgias.  Neurological: Negative for dizziness and headaches.  All other systems reviewed and are negative.   Blood pressure 149/76, pulse 68, temperature 98.2 F (36.8 C), resp. rate 19, SpO2 100 %. Physical Exam  Nursing note and vitals reviewed.  Well nourished, well developed, in no acute distress HEENT: Pupils are equal round react to light accommodation extraocular movements are intact.  Neck: no JVDNo cervical lymphadenopathy. Cardiac: Regular rate and rhythm without murmurs rubs or gallops. Lungs:  clear to auscultation bilaterally, no wheezing, rhonchi or rales Abd: soft, nontender, positive bowel sounds all quadrants, no hepatosplenomegaly Ext: no lower extremity edema.  2+ radial and dorsalis pedis pulses. Skin: warm and dry Neuro:  Grossly normal    Assessment/Plan Principal Problem:   Chest pain Active Problems:    Hyperlipidemia with target LDL less than 70   Type 2 diabetes mellitus with renal manifestations (HCC)   CAD S/P CFX BMS (? med complinace) x 2 06/16/15   Chronic renal disease, stage II   Admit for obs and cycle troponin.  EKG without acute changes and two POC trops negative.  If she has more chest pain, we will schedule a left heart cath, otherwise treadmill myoview.  Hold BB tonight.  Continue ASA, brilinta.  BNP WNL.  Nothing acute on CXR.  Anxiety?  Tarri Fuller, [place] 09/17/2015, 5:28 PM

## 2015-09-17 NOTE — ED Notes (Signed)
Pt in no distress; no needs; has call bell.

## 2015-09-17 NOTE — ED Notes (Signed)
Patient is resting comfortably. 

## 2015-09-17 NOTE — ED Notes (Signed)
PT reports that she had MI in August. States SOB; sleeping on 2 pillows; bilateral leg swelling; wet cough for 2 months . Chest pressure and gurgling feeling central chest for starting yesterday evening. Took aspirin 81 mg and 1 nitro.

## 2015-09-18 ENCOUNTER — Encounter (HOSPITAL_COMMUNITY): Payer: Self-pay | Admitting: Physician Assistant

## 2015-09-18 ENCOUNTER — Encounter (HOSPITAL_COMMUNITY)
Admission: EM | Disposition: A | Discharge status: Home / Self Care | Payer: No Typology Code available for payment source | Source: Home / Self Care | Attending: Emergency Medicine

## 2015-09-18 ENCOUNTER — Ambulatory Visit (HOSPITAL_COMMUNITY): Admission: RE | Admit: 2015-09-18 | Payer: No Typology Code available for payment source | Source: Ambulatory Visit

## 2015-09-18 DIAGNOSIS — I25110 Atherosclerotic heart disease of native coronary artery with unstable angina pectoris: Secondary | ICD-10-CM

## 2015-09-18 DIAGNOSIS — E118 Type 2 diabetes mellitus with unspecified complications: Secondary | ICD-10-CM

## 2015-09-18 HISTORY — PX: CARDIAC CATHETERIZATION: SHX172

## 2015-09-18 LAB — CBC
HEMATOCRIT: 32.7 % — AB (ref 36.0–46.0)
HEMOGLOBIN: 10.4 g/dL — AB (ref 12.0–15.0)
MCH: 28.7 pg (ref 26.0–34.0)
MCHC: 31.8 g/dL (ref 30.0–36.0)
MCV: 90.1 fL (ref 78.0–100.0)
Platelets: 194 10*3/uL (ref 150–400)
RBC: 3.63 MIL/uL — AB (ref 3.87–5.11)
RDW: 14.4 % (ref 11.5–15.5)
WBC: 6.1 10*3/uL (ref 4.0–10.5)

## 2015-09-18 LAB — PROTIME-INR
INR: 1.12 (ref 0.00–1.49)
PROTHROMBIN TIME: 14.6 s (ref 11.6–15.2)

## 2015-09-18 LAB — TROPONIN I: Troponin I: <0.03 ng/mL (ref <0.031)

## 2015-09-18 LAB — LIPID PANEL
CHOL/HDL RATIO: 3.3 ratio
Cholesterol: 95 mg/dL (ref 0–200)
HDL: 29 mg/dL — AB (ref >40)
LDL CALC: 50 mg/dL (ref 0–99)
Triglycerides: 81 mg/dL (ref <150)
VLDL: 16 mg/dL (ref 0–40)

## 2015-09-18 LAB — BASIC METABOLIC PANEL
ANION GAP: 7 (ref 5–15)
BUN: 12 mg/dL (ref 6–20)
CO2: 26 mmol/L (ref 22–32)
Calcium: 8.9 mg/dL (ref 8.9–10.3)
Chloride: 105 mmol/L (ref 101–111)
Creatinine, Ser: 0.93 mg/dL (ref 0.44–1.00)
GFR calc Af Amer: >60 mL/min (ref >60)
GLUCOSE: 92 mg/dL (ref 65–99)
POTASSIUM: 3.9 mmol/L (ref 3.5–5.1)
Sodium: 138 mmol/L (ref 135–145)

## 2015-09-18 LAB — GLUCOSE, CAPILLARY
GLUCOSE-CAPILLARY: 88 mg/dL (ref 65–99)
Glucose-Capillary: 79 mg/dL (ref 65–99)

## 2015-09-18 LAB — CREATININE, SERUM: CREATININE: 0.9 mg/dL (ref 0.44–1.00)

## 2015-09-18 SURGERY — LEFT HEART CATH AND CORONARY ANGIOGRAPHY
Anesthesia: LOCAL

## 2015-09-18 MED ORDER — ISOSORBIDE MONONITRATE ER 30 MG PO TB24: 15.0000 mg | ORAL_TABLET | Freq: Every day | ORAL | Status: DC

## 2015-09-18 MED ORDER — SODIUM CHLORIDE 0.9 % IJ SOLN
3.0000 mL | Freq: Two times a day (BID) | INTRAMUSCULAR | Status: DC
  Administered 2015-09-18: 3 mL via INTRAVENOUS

## 2015-09-18 MED ORDER — ATORVASTATIN CALCIUM 80 MG PO TABS: 80.0000 mg | ORAL_TABLET | Freq: Every day | ORAL | Status: DC

## 2015-09-18 MED ORDER — HEPARIN SODIUM (PORCINE) 5000 UNIT/ML IJ SOLN
5000.0000 [IU] | Freq: Three times a day (TID) | INTRAMUSCULAR | Status: DC
  Filled 2015-09-18: qty 1

## 2015-09-18 MED ORDER — SODIUM CHLORIDE 0.9 % IJ SOLN: 3.0000 mL | INTRAMUSCULAR | Status: DC | PRN

## 2015-09-18 MED ORDER — ATORVASTATIN CALCIUM 80 MG PO TABS
80.0000 mg | ORAL_TABLET | Freq: Every day | ORAL | Status: DC
  Administered 2015-09-18: 80 mg via ORAL
  Filled 2015-09-18: qty 1

## 2015-09-18 MED ORDER — VERAPAMIL HCL 2.5 MG/ML IV SOLN
INTRAVENOUS | Status: AC
  Filled 2015-09-18: qty 2

## 2015-09-18 MED ORDER — FENTANYL CITRATE (PF) 100 MCG/2ML IJ SOLN
INTRAMUSCULAR | Status: AC
  Filled 2015-09-18: qty 4

## 2015-09-18 MED ORDER — LIDOCAINE HCL (PF) 1 % IJ SOLN
INTRAMUSCULAR | Status: DC | PRN
  Administered 2015-09-18: 17:00:00

## 2015-09-18 MED ORDER — PNEUMOCOCCAL VAC POLYVALENT 25 MCG/0.5ML IJ INJ
0.5000 mL | INJECTION | INTRAMUSCULAR | Status: AC
  Administered 2015-09-18: 0.5 mL via INTRAMUSCULAR
  Filled 2015-09-18: qty 0.5

## 2015-09-18 MED ORDER — SODIUM CHLORIDE 0.9 % WEIGHT BASED INFUSION: 3.0000 mL/kg/h | INTRAVENOUS | Status: DC

## 2015-09-18 MED ORDER — ONDANSETRON HCL 4 MG/2ML IJ SOLN: 4.0000 mg | Freq: Four times a day (QID) | INTRAMUSCULAR | Status: DC | PRN

## 2015-09-18 MED ORDER — INFLUENZA VAC SPLIT QUAD 0.5 ML IM SUSY
0.5000 mL | PREFILLED_SYRINGE | INTRAMUSCULAR | Status: AC
  Administered 2015-09-18: 0.5 mL via INTRAMUSCULAR

## 2015-09-18 MED ORDER — MIDAZOLAM HCL 2 MG/2ML IJ SOLN
INTRAMUSCULAR | Status: AC
  Filled 2015-09-18: qty 4

## 2015-09-18 MED ORDER — IOHEXOL 350 MG/ML SOLN
INTRAVENOUS | Status: DC | PRN
  Administered 2015-09-18: 70 mL via INTRA_ARTERIAL

## 2015-09-18 MED ORDER — SODIUM CHLORIDE 0.9 % IV SOLN: 250.0000 mL | INTRAVENOUS | Status: DC | PRN

## 2015-09-18 MED ORDER — NITROGLYCERIN 1 MG/10 ML FOR IR/CATH LAB
INTRA_ARTERIAL | Status: AC
  Filled 2015-09-18: qty 10

## 2015-09-18 MED ORDER — SODIUM CHLORIDE 0.9 % IJ SOLN: 3.0000 mL | Freq: Two times a day (BID) | INTRAMUSCULAR | Status: DC

## 2015-09-18 MED ORDER — LIDOCAINE HCL (PF) 1 % IJ SOLN
INTRAMUSCULAR | Status: AC
  Filled 2015-09-18: qty 30

## 2015-09-18 MED ORDER — SODIUM CHLORIDE 0.9 % IV SOLN: INTRAVENOUS | Status: DC

## 2015-09-18 MED ORDER — MIDAZOLAM HCL 2 MG/2ML IJ SOLN
INTRAMUSCULAR | Status: DC | PRN
  Administered 2015-09-18: 2 mg via INTRAVENOUS

## 2015-09-18 MED ORDER — ACETAMINOPHEN 325 MG PO TABS: 650.0000 mg | ORAL_TABLET | ORAL | Status: DC | PRN

## 2015-09-18 MED ORDER — ASPIRIN 81 MG PO CHEW: 81.0000 mg | CHEWABLE_TABLET | ORAL | Status: DC

## 2015-09-18 MED ORDER — ISOSORBIDE MONONITRATE ER 30 MG PO TB24
30.0000 mg | ORAL_TABLET | Freq: Every day | ORAL | Status: DC
  Administered 2015-09-18: 30 mg via ORAL
  Filled 2015-09-18: qty 1

## 2015-09-18 MED ORDER — HEPARIN SODIUM (PORCINE) 1000 UNIT/ML IJ SOLN
INTRAMUSCULAR | Status: AC
  Filled 2015-09-18: qty 1

## 2015-09-18 MED ORDER — HEPARIN (PORCINE) IN NACL 2-0.9 UNIT/ML-% IJ SOLN
INTRAMUSCULAR | Status: AC
  Filled 2015-09-18: qty 1000

## 2015-09-18 MED ORDER — FENTANYL CITRATE (PF) 100 MCG/2ML IJ SOLN
INTRAMUSCULAR | Status: DC | PRN
  Administered 2015-09-18: 25 ug via INTRAVENOUS

## 2015-09-18 MED ORDER — SODIUM CHLORIDE 0.9 % WEIGHT BASED INFUSION
1.0000 mL/kg/h | INTRAVENOUS | Status: DC
  Administered 2015-09-18: 1 mL/kg/h via INTRAVENOUS

## 2015-09-18 SURGICAL SUPPLY — 7 items
CATH INFINITI 5FR MULTPACK ANG (CATHETERS) ×1 IMPLANT
KIT HEART LEFT (KITS) ×2 IMPLANT
PACK CARDIAC CATHETERIZATION (CUSTOM PROCEDURE TRAY) ×2 IMPLANT
SHEATH PINNACLE 5F 10CM (SHEATH) ×1 IMPLANT
SYR MEDRAD MARK V 150ML (SYRINGE) ×2 IMPLANT
TRANSDUCER W/STOPCOCK (MISCELLANEOUS) ×2 IMPLANT
WIRE EMERALD 3MM-J .035X150CM (WIRE) ×1 IMPLANT

## 2015-09-18 NOTE — Progress Notes (Addendum)
Site area: RFA Site Prior to Removal:  Level 0 Pressure Applied For:58min Manual:   yes Patient Status During Pull:   Post Pull Site:  Level Post Pull Instructions Given:  Yes Post Pull Pulses Present: palpable Dressing Applied: clear  Bedrest begins @  [id number] till 2130 Comments:

## 2015-09-18 NOTE — Progress Notes (Signed)
Patient Name: Virginia [name] Date of Encounter: 09/18/2015  Primary Cardiologist: Dr. Irish Lack   Principal Problem:   Chest pain Active Problems:   Hyperlipidemia with target LDL less than 70   Type 2 diabetes mellitus with renal manifestations (HCC)   CAD S/P CFX BMS (? med complinace) x 2 06/16/15   Chronic renal disease, stage II    SUBJECTIVE  Denies any CP or SOB. Was having constant CP with SOB started on the night of 11/9, eventually resolved after midnight in AM of 11/11  CURRENT MEDS . aspirin EC  81 mg Oral Daily  . atorvastatin  40 mg Oral q1800  . Influenza vac split quadrivalent PF  0.5 mL Intramuscular Tomorrow-1000  . lisinopril  2.5 mg Oral Daily  . metoprolol tartrate  25 mg Oral BID  . metroNIDAZOLE  500 mg Oral BID  . pneumococcal 23 valent vaccine  0.5 mL Intramuscular Tomorrow-1000  . ticagrelor  90 mg Oral BID    OBJECTIVE  Filed Vitals:   09/17/15 2045 09/17/15 2152 09/17/15 2345 09/18/15 0555  BP: 141/84 151/76 140/78 141/73  Pulse: 74 66 60 63  Temp:  98.5 F (36.9 C) 98.8 F (37.1 C) 98.1 F (36.7 C)  TempSrc:  Oral Oral Oral  Resp: 15 18 16 18   Height:  5\' 6"  (1.676 m)    Weight:  171 lb 6.4 oz (77.747 kg)  171 lb 6.4 oz (77.747 kg)  SpO2: 100% 99% 99% 100%    Intake/Output Summary (Last 24 hours) at 09/18/15 0757 Last data filed at 09/18/15 0400  Gross per 24 hour  Intake    120 ml  Output    250 ml  Net   -130 ml   Filed Weights   09/17/15 2152 09/18/15 0555  Weight: 171 lb 6.4 oz (77.747 kg) 171 lb 6.4 oz (77.747 kg)    PHYSICAL EXAM  General: Pleasant, NAD. Neuro: Alert and oriented X 3. Moves all extremities spontaneously. Psych: Normal affect. HEENT:  Normal  Neck: Supple without bruits or JVD. Lungs:  Resp regular and unlabored, CTA. Heart: RRR no s3, s4, or murmurs. Abdomen: Soft, non-tender, non-distended, BS + x 4.  Extremities: No clubbing, cyanosis or edema. DP/PT/Radials 2+ and equal  bilaterally.  Accessory Clinical Findings  CBC  Recent Labs  09/17/15 1112  WBC 5.5  NEUTROABS 2.7  HGB 10.8*  HCT 33.8*  MCV 90.4  PLT [id number]   Basic Metabolic Panel  Recent Labs  09/17/15 1112 09/18/15 0408  NA 137 138  K 4.1 3.9  CL 103 105  CO2 26 26  GLUCOSE 103* 92  BUN 6 12  CREATININE 1.06* 0.93  CALCIUM 9.2 8.9   Liver Function Tests  Recent Labs  09/17/15 1112  AST 15  ALT 14  ALKPHOS 87  BILITOT 0.5  PROT 6.7  ALBUMIN 3.5   Cardiac Enzymes  Recent Labs  09/17/15 2300 09/18/15 0408  TROPONINI <0.03 <0.03   Fasting Lipid Panel  Recent Labs  09/18/15 0408  CHOL 95  HDL 29*  LDLCALC 50  TRIG 81  CHOLHDL 3.3    TELE NSR with occasional PVCs, no significant ventricular ectopy    ECG  NSR without significant ST-T wave changes, TWI in lead III but not in adjacent lead  Echocardiogram 06/18/2015  LV EF: 60% -  65%  ------------------------------------------------------------------- Indications:   Chest pain 786.51.  ------------------------------------------------------------------- History:  PMH:  Myocardial infarction. Risk factors: Dyslipidemia.  ------------------------------------------------------------------- Study Conclusions  -  Left ventricle: The cavity size was normal. Wall thickness was normal. Systolic function was normal. The estimated ejection fraction was in the range of 60% to 65%. Wall motion was normal; there were no regional wall motion abnormalities. - Mitral valve: There was mild regurgitation.    Radiology/Studies  Dg Chest 2 View  09/17/2015  CLINICAL DATA:  Shortness of breath and chest tightness for 1 day EXAM: CHEST  2 VIEW COMPARISON:  December 20, 2011 FINDINGS: There is no edema or consolidation. The heart size and pulmonary vascularity are normal. No adenopathy. No pneumothorax. No bone lesions. IMPRESSION: No edema or consolidation. Electronically Signed   By: Lowella Grip  III M.D.   On: 09/17/2015 11:12    ASSESSMENT AND PLAN  1. Prolonged chest pain reminiscent of sx prior to last MI  - discussed with MD, will proceed with cardiac catheterization today to definitively assess BMS in LCx  - Risk and benefit of procedure explained to the patient who display clear understanding and agree to proceed. Discussed with patient possible procedural risk include bleeding, vascular injury, renal injury, arrythmia, MI, stroke and loss of limb or life.  - if cath negative, expect discharge later this afternoon  2. CAD s/p STEMI 06/2015  - Cardiac cath 06/17/15: 100% occluded Cx. Two overlapping BMS placed. 50 % D1. Minimal disease else where. EF 60-65%, Mild MR  3. HTN - on BB & ACE-I.  Can probably increase ACE-I on d/c.  HR in 60s precludes increasing BB. 4. HLD - on Atorvastatin Moderate dose 5. DM - has been diet controlled; significant wgt loss post-MI.  Hilbert Corrigan PA-C Pager: [phone]  I have seen, examined and evaluated the patient this Am along with Mr. Eulas Post, [place].  After reviewing all the available data and chart,  I agree with his findings, examination as well as impression recommendations.  She had more Sx overnight - both @ rest & with ambulation -> at this point I am concerned about ISR with BMS PCI during Acute STEMI -- now with recurrent prodromal Sx of Unstable Angina- Plan LHC/Angio today  I am concerned that a Nuclear Stress Test may very well be read as Inferolateral Infarct with Peri-Infarct ischemia - HIGH RISK which would lead to cath regardless.  CATH CONSENT Procedure:  Left Heart Catheterization with Native Coronary Angiography and Possible Percutaneous Coronary Intervention  The procedure with Risks/Benefits/Alternatives and Indications was reviewed with the patient .  All questions were answered.    Risks / Complications include, but not limited to: Death, MI, CVA/TIA, VF/VT (with defibrillation), Bradycardia (need for temporary  pacer placement), contrast induced nephropathy, bleeding / bruising / hematoma / pseudoaneurysm, vascular or coronary injury (with possible emergent CT or Vascular Surgery), adverse medication reactions, infection.  Additional risks involving the use of radiation with the possibility of radiation burns and cancer were explained in detail.  The patient  voices understanding and agree to proceed.       Leonie Man, M.D., M.S. Interventional Cardiologist   Pager # [phone]

## 2015-09-18 NOTE — Care Management Note (Addendum)
Case Management Note  Patient Details  Name: Virginia [name] MRN: [id number] Date of Birth: Mar 30, 1961  Subjective/Objective:  Pt admitted for chest pain. Pt is without insurance- has PCP Dr. Doreene Burke @ the [facility].                  Action/Plan: CM did reach out to [place] has hospital f/u scheduled for Sep 29, 2015 @ 0930. Pt is aware. Per Janett Billow pt just picked up Brilinta on Nov 9th and paid $10.00. Per Janett Billow pt will need to return to [facility] to sign patient assistance paperwork for Brilinta and pharmacy can submit to company. If approved pt will receive up to a year supply free. CM did relay information to the pt. Pt stated understanding of what needs to be done. No further needs from CM at this time.   Expected Discharge Date:                  Expected Discharge Plan:  Home/Self Care  In-House Referral:  NA  Discharge planning Services  CM Consult, Follow-up appt scheduled, [facility], Medication Assistance  Post Acute Care Choice:  NA Choice offered to:  NA  DME Arranged:  N/A DME Agency:  NA  HH Arranged:  NA HH Agency:  NA  Status of Service:  Completed, signed off  Medicare Important Message Given:    Date Medicare IM Given:    Medicare IM give by:    Date Additional Medicare IM Given:    Additional Medicare Important Message give by:     If discussed at [place] of Stay Meetings, dates discussed:    Additional Comments:  Bethena Roys, RN 09/18/2015, 10:21 AM

## 2015-09-18 NOTE — Interval H&P Note (Signed)
Cath Lab Visit (complete for each Cath Lab visit)  Clinical Evaluation Leading to the Procedure:   ACS: No.  Non-ACS:    Anginal Classification: CCS IV  Anti-ischemic medical therapy: Maximal Therapy (2 or more classes of medications)  Non-Invasive Test Results: No non-invasive testing performed  Prior CABG: No previous CABG      History and Physical Interval Note:  09/18/2015 4:10 PM  Virginia [name]  has presented today for surgery, with the diagnosis of chest pain  The various methods of treatment have been discussed with the patient and family. After consideration of risks, benefits and other options for treatment, the patient has consented to  Procedure(s): Left Heart Cath and Coronary Angiography (N/A) as a surgical intervention .  The patient's history has been reviewed, patient examined, no change in status, stable for surgery.  I have reviewed the patient's chart and labs.  Questions were answered to the patient's satisfaction.     Marielis Samara A

## 2015-09-18 NOTE — Discharge Instructions (Signed)
No driving for 24 hours. No lifting over 5 lbs for 1 week. No sexual activity for 1 week. Keep procedure site clean & dry. If you notice increased pain, swelling, bleeding or pus, call/return!  You may shower, but no soaking baths/hot tubs/pools for 1 week.   Please hold metformin for 48 hours after cath due to interaction with contrast dye, you can resume Metformin on 09/20/2015

## 2015-09-18 NOTE — Hospital Discharge Follow-Up (Signed)
This Case Manager received call from Jacqlyn Krauss, RN CM regarding patient needing follow-up appointment.  After further chart review, patient has appointment scheduled on 09/29/15 at 0930 with Dr. Doreene Burke at the [facility]. Patient was prescribed Brilinta prior to admission so this Case Manager placed call to [place] to determine if patient completed paperwork for patient assistance program for Brilinta. Was informed that patient picked up Brilinta at [facility] and [place] on 09/16/15 and had a coupon for medication.  Per pharmacy tech, patient still needs to come to pharmacy and sign patient assistance application. Once signed, application will be sent to manufacturer where they indicate it typically takes around 4 weeks for processing. If eligible for patient assistance, pharmacy tech indicated medication would be free. Patient's upcoming appointment placed on AVS. Jacqlyn Krauss, RN CM updated.

## 2015-09-18 NOTE — Progress Notes (Signed)
Report called from [facility] on a patient going to 3w-02 using SBAR format, reviewed VS, meds, orders, labs and pt's genrealized condition, awaiting pt to arrive. 2150 Pt to 3w-02 in stable condition. Pt oriented to room and surroundings and reviewed call light and telephone. Pt made aware of whte board with my name and number and her nurse tech's name and number, assessment done and all info given regarding testing in the am. Pt having dull pressure pain to medial chest that has been present since early this am but is refusing Nitroglycerin at this time, will continue to monitor, all admission paperwork done and will give her current meds. Pt is a diabetic, will get a BS and treat according to MD orders. 90 Pt still having the same chest discomfort and ios still refusing Nitro for now, agreed to take some tylenol at this time, will continue to monitor and pt agreed to take Nitro if no relief after the Tylenol, no other changes at this time.

## 2015-09-18 NOTE — Discharge Summary (Signed)
Discharge Summary   Patient ID: Virginia [name],  MRN: [id number], DOB/AGE: 55-Oct-1962 55 y.o.  Admit date: 09/17/2015 Discharge date: 09/18/2015  Primary Care Provider: [name] Primary Cardiologist: Dr. Irish Lack  Discharge Diagnoses Principal Problem:   Chest pain Active Problems:   Hyperlipidemia with target LDL less than 70   Type 2 diabetes mellitus with renal manifestations ([place])   CAD S/P CFX BMS (? med complinace) x 2 06/16/15   Chronic renal disease, stage II   Allergies No Known Allergies  Procedures  Cardiac catheterization 09/18/2015 Conclusion     Prox LAD lesion, 60% stenosed.  2nd Diag lesion, 95% stenosed.  The left ventricular systolic function is normal.  Mid LAD to Dist LAD lesion, 20% stenosed.  Normal LV function without residual wall motion abnormality with an ejection fraction of 55-65%.  Two-vessel CAD with 60% eccentric smooth tubular ostial LAD stenosis, 20% mid stenosis in the region of a very small bifurcating diagonal vessel with 95% stenosis in the inferior limb of the small diagonal branch; widely patent tandem bare-metal stents in the AV groove circumflex vessel; normal RCA.  RECOMMENDATION: Increased medical therapy. Nitrates will be added to her regimen of beta blocker, ACE inhibitor and statin. Consider amlodipine if recurrent symptomatology.      Hospital Course  The patient is a 54 year old female with past medical history of HTN, HLD, DM, and CAD s/p PCI in 06/2015. She underwent a cardiac catheterization on 06/17/2015 which showed 100% occluded left circumflex treated with 2 overlapping bare metal stent, 50% D1 stenosis, minimal disease elsewhere. EF was noted to be 60-65% with mild MR. She [facility] to [facility]17[facility]15 with chest pressure, shortness breath and palpitation. She was admitted for observation. Overnight, serial troponin was negative. EKG did not reveal any ischemic changes.  She was seen  in AM of 11/11 at which time she denies any significant chest discomfort or shortness breath. Her chest pain has resolved. We have discussed different options with the patient including stress test versus cardiac catheterization. Given the similarity to previous angina, she agreed to undergo diagnostic cardiac catheterization to definitively assess coronary anatomy and make sure there is no in-stent restenosis. She underwent the planned procedure on 11/11 which showed no acute changes with patent stent. She does have tiny diagonal branch (D2) with stenosis that is not amenable to PCI or balloon angioplasty. We have started low-dose Imdur. She is deemed stable for discharge from cardiology perspective. I have arranged 2-4 weeks outpatient follow-up with Dr. Hassell Done office. She has been instructed not to lift anything greater than 5 pounds for one week. She has also been instructed to hold metformin for 48 hours after Cardiac cath, she can restart metformin on 11/13. If she does have recurrent CP, would need to uptitrate Imdur.   Discharge Vitals Blood pressure 151/104, pulse 0, temperature 98.5 F (36.9 C), temperature source Oral, resp. rate 0, height 5\' 6"  (1.676 m), weight 171 lb 6.4 oz (77.747 kg), SpO2 0 %.  Filed Weights   09/17/15 2152 09/18/15 0555  Weight: 171 lb 6.4 oz (77.747 kg) 171 lb 6.4 oz (77.747 kg)    Labs  CBC  Recent Labs  09/17/15 1112  WBC 5.5  NEUTROABS 2.7  HGB 10.8*  HCT 33.8*  MCV 90.4  PLT A999333   Basic Metabolic Panel  Recent Labs  09/17/15 1112 09/18/15 0408  NA 137 138  K 4.1 3.9  CL 103 105  CO2 26 26  GLUCOSE 103*  92  BUN 6 12  CREATININE 1.06* 0.93  CALCIUM 9.2 8.9   Liver Function Tests  Recent Labs  09/17/15 1112  AST 15  ALT 14  ALKPHOS 87  BILITOT 0.5  PROT 6.7  ALBUMIN 3.5   Cardiac Enzymes  Recent Labs  09/17/15 2300 09/18/15 0408 09/18/15 1106  TROPONINI <0.03 <0.03 <0.03   Fasting Lipid Panel  Recent Labs   09/18/15 0408  CHOL 95  HDL 29*  LDLCALC 50  TRIG 81  CHOLHDL 3.3    Disposition  Pt is being discharged home today in good condition.  Follow-up Plans & Appointments      Follow-up Information    Follow up with [facility] On 09/29/2015.   Why:  Appointment on 09/29/15 at 9:30 am with Dr. Doreene Burke.   Contact information:   [place] [phone]       Follow up with Ermalinda Barrios, PA-C On 10/05/2015.   Specialty:  Cardiology   Why:  12:45pm. Cardiology followup   Contact information:   [address] [place] [address] [phone]       Discharge Medications    Medication List    TAKE these medications        aspirin EC 81 MG tablet  Take 81 mg by mouth daily.     atorvastatin 80 MG tablet  Commonly known as:  LIPITOR  Take 0.5 tablets (40 mg total) by mouth daily at 6 PM.     isosorbide mononitrate 30 MG 24 hr tablet  Commonly known as:  IMDUR  Take 0.5 tablets (15 mg total) by mouth daily.     lisinopril 2.5 MG tablet  Commonly known as:  PRINIVIL,ZESTRIL  Take 1 tablet (2.5 mg total) by mouth daily.     metFORMIN 500 MG tablet  Commonly known as:  GLUCOPHAGE  Take 1 tablet (500 mg total) by mouth 2 (two) times daily with a meal.     metoprolol tartrate 25 MG tablet  Commonly known as:  LOPRESSOR  Take 1 tablet (25 mg total) by mouth 2 (two) times daily.     metroNIDAZOLE 500 MG tablet  Commonly known as:  FLAGYL  Take 1 tablet (500 mg total) by mouth 2 (two) times daily.     nitroGLYCERIN 0.4 MG SL tablet  Commonly known as:  NITROSTAT  Place 1 tablet (0.4 mg total) under the tongue every 5 (five) minutes as needed for chest pain.     ticagrelor 90 MG Tabs tablet  Commonly known as:  BRILINTA  Take 1 tablet (90 mg total) by mouth 2 (two) times daily.        Duration of Discharge Encounter   Greater than 30 minutes including physician time.  Hilbert Corrigan  PA-C Pager: [phone] 09/18/2015, 5:15 PM

## 2015-09-18 NOTE — H&P (View-Only) (Signed)
Patient Name: Virginia [name] Date of Encounter: 09/18/2015  Primary Cardiologist: Dr. Irish Lack   Principal Problem:   Chest pain Active Problems:   Hyperlipidemia with target LDL less than 70   Type 2 diabetes mellitus with renal manifestations (H[facility])   CAD S/P CFX BMS (? med complinace) x 2 06/16/15   Chronic renal disease, stage II    SUBJECTIVE  Denies any CP or SOB. Was having constant CP with SOB started on the night of 11/9, eventually resolved after midnight in AM of 11/11  CURRENT MEDS . aspirin EC  81 mg Oral Daily  . atorvastatin  40 mg Oral q1800  . Influenza vac split quadrivalent PF  0.5 mL Intramuscular Tomorrow-1000  . lisinopril  2.5 mg Oral Daily  . metoprolol tartrate  25 mg Oral BID  . metroNIDAZOLE  500 mg Oral BID  . pneumococcal 23 valent vaccine  0.5 mL Intramuscular Tomorrow-1000  . ticagrelor  90 mg Oral BID    OBJECTIVE  Filed Vitals:   09/17/15 2045 09/17/15 2152 09/17/15 2345 09/18/15 0555  BP: 141/84 151/76 140/78 141/73  Pulse: 74 66 60 63  Temp:  98.5 F (36.9 C) 98.8 F (37.1 C) 98.1 F (36.7 C)  TempSrc:  Oral Oral Oral  Resp: 15 18 16 18   Height:  5\' 6"  (1.676 m)    Weight:  171 lb 6.4 oz (77.747 kg)  171 lb 6.4 oz (77.747 kg)  SpO2: 100% 99% 99% 100%    Intake/Output Summary (Last 24 hours) at 09/18/15 0757 Last data filed at 09/18/15 0400  Gross per 24 hour  Intake    120 ml  Output    250 ml  Net   -130 ml   Filed Weights   09/17/15 2152 09/18/15 0555  Weight: 171 lb 6.4 oz (77.747 kg) 171 lb 6.4 oz (77.747 kg)    PHYSICAL EXAM  General: Pleasant, NAD. Neuro: Alert and oriented X 3. Moves all extremities spontaneously. Psych: Normal affect. HEENT:  Normal  Neck: Supple without bruits or JVD. Lungs:  Resp regular and unlabored, CTA. Heart: RRR no s3, s4, or murmurs. Abdomen: Soft, non-tender, non-distended, BS + x 4.  Extremities: No clubbing, cyanosis or edema. DP/PT/Radials 2+ and equal  bilaterally.  Accessory Clinical Findings  CBC  Recent Labs  09/17/15 1112  WBC 5.5  NEUTROABS 2.7  HGB 10.8*  HCT 33.8*  MCV 90.4  PLT [id number]   Basic Metabolic Panel  Recent Labs  09/17/15 1112 09/18/15 0408  NA 137 138  K 4.1 3.9  CL 103 105  CO2 26 26  GLUCOSE 103* 92  BUN 6 12  CREATININE 1.06* 0.93  CALCIUM 9.2 8.9   Liver Function Tests  Recent Labs  09/17/15 1112  AST 15  ALT 14  ALKPHOS 87  BILITOT 0.5  PROT 6.7  ALBUMIN 3.5   Cardiac Enzymes  Recent Labs  09/17/15 2300 09/18/15 0408  TROPONINI <0.03 <0.03   Fasting Lipid Panel  Recent Labs  09/18/15 0408  CHOL 95  HDL 29*  LDLCALC 50  TRIG 81  CHOLHDL 3.3    TELE NSR with occasional PVCs, no significant ventricular ectopy    ECG  NSR without significant ST-T wave changes, TWI in lead III but not in adjacent lead  Echocardiogram 06/18/2015  LV EF: 60% -  65%  ------------------------------------------------------------------- Indications:   Chest pain 786.51.  ------------------------------------------------------------------- History:  PMH:  Myocardial infarction. Risk factors: Dyslipidemia.  ------------------------------------------------------------------- Study Conclusions  -  Left ventricle: The cavity size was normal. Wall thickness was normal. Systolic function was normal. The estimated ejection fraction was in the range of 60% to 65%. Wall motion was normal; there were no regional wall motion abnormalities. - Mitral valve: There was mild regurgitation.    Radiology/Studies  Dg Chest 2 View  09/17/2015  CLINICAL DATA:  Shortness of breath and chest tightness for 1 day EXAM: CHEST  2 VIEW COMPARISON:  December 20, 2011 FINDINGS: There is no edema or consolidation. The heart size and pulmonary vascularity are normal. No adenopathy. No pneumothorax. No bone lesions. IMPRESSION: No edema or consolidation. Electronically Signed   By: Lowella Grip  III M.D.   On: 09/17/2015 11:12    ASSESSMENT AND PLAN  1. Prolonged chest pain reminiscent of sx prior to last MI  - discussed with MD, will proceed with cardiac catheterization today to definitively assess BMS in LCx  - Risk and benefit of procedure explained to the patient who display clear understanding and agree to proceed. Discussed with patient possible procedural risk include bleeding, vascular injury, renal injury, arrythmia, MI, stroke and loss of limb or life.  - if cath negative, expect discharge later this afternoon  2. CAD s/p STEMI 06/2015  - Cardiac cath 06/17/15: 100% occluded Cx. Two overlapping BMS placed. 50 % D1. Minimal disease else where. EF 60-65%, Mild MR  3. HTN - on BB & ACE-I.  Can probably increase ACE-I on d/c.  HR in 60s precludes increasing BB. 4. HLD - on Atorvastatin Moderate dose 5. DM - has been diet controlled; significant wgt loss post-MI.  Hilbert Corrigan PA-C Pager: [phone]  I have seen, examined and evaluated the patient this Am along with Mr. Eulas Post, [place].  After reviewing all the available data and chart,  I agree with his findings, examination as well as impression recommendations.  She had more Sx overnight - both @ rest & with ambulation -> at this point I am concerned about ISR with BMS PCI during Acute STEMI -- now with recurrent prodromal Sx of Unstable Angina- Plan LHC/Angio today  I am concerned that a Nuclear Stress Test may very well be read as Inferolateral Infarct with Peri-Infarct ischemia - HIGH RISK which would lead to cath regardless.  CATH CONSENT Procedure:  Left Heart Catheterization with Native Coronary Angiography and Possible Percutaneous Coronary Intervention  The procedure with Risks/Benefits/Alternatives and Indications was reviewed with the patient .  All questions were answered.    Risks / Complications include, but not limited to: Death, MI, CVA/TIA, VF/VT (with defibrillation), Bradycardia (need for temporary  pacer placement), contrast induced nephropathy, bleeding / bruising / hematoma / pseudoaneurysm, vascular or coronary injury (with possible emergent CT or Vascular Surgery), adverse medication reactions, infection.  Additional risks involving the use of radiation with the possibility of radiation burns and cancer were explained in detail.  The patient  voices understanding and agree to proceed.       Leonie Man, M.D., M.S. Interventional Cardiologist   Pager # [phone]

## 2015-09-19 ENCOUNTER — Inpatient Hospital Stay (HOSPITAL_COMMUNITY): Payer: No Typology Code available for payment source

## 2015-09-19 ENCOUNTER — Inpatient Hospital Stay (HOSPITAL_COMMUNITY)
Admission: EM | Admit: 2015-09-19 | Discharge: 2015-09-22 | DRG: 394 | Disposition: A | Discharge status: Home / Self Care | Payer: Self-pay | Attending: Internal Medicine | Admitting: Internal Medicine

## 2015-09-19 ENCOUNTER — Encounter (HOSPITAL_COMMUNITY): Payer: Self-pay

## 2015-09-19 DIAGNOSIS — K921 Melena: Secondary | ICD-10-CM | POA: Diagnosis present

## 2015-09-19 DIAGNOSIS — K317 Polyp of stomach and duodenum: Principal | ICD-10-CM | POA: Diagnosis present

## 2015-09-19 DIAGNOSIS — R55 Syncope and collapse: Secondary | ICD-10-CM | POA: Diagnosis present

## 2015-09-19 DIAGNOSIS — Z79899 Other long term (current) drug therapy: Secondary | ICD-10-CM

## 2015-09-19 DIAGNOSIS — D649 Anemia, unspecified: Secondary | ICD-10-CM | POA: Diagnosis present

## 2015-09-19 DIAGNOSIS — I509 Heart failure, unspecified: Secondary | ICD-10-CM | POA: Diagnosis present

## 2015-09-19 DIAGNOSIS — N289 Disorder of kidney and ureter, unspecified: Secondary | ICD-10-CM

## 2015-09-19 DIAGNOSIS — Z7984 Long term (current) use of oral hypoglycemic drugs: Secondary | ICD-10-CM

## 2015-09-19 DIAGNOSIS — N951 Menopausal and female climacteric states: Secondary | ICD-10-CM | POA: Diagnosis present

## 2015-09-19 DIAGNOSIS — E119 Type 2 diabetes mellitus without complications: Secondary | ICD-10-CM

## 2015-09-19 DIAGNOSIS — T383X5A Adverse effect of insulin and oral hypoglycemic [antidiabetic] drugs, initial encounter: Secondary | ICD-10-CM | POA: Diagnosis present

## 2015-09-19 DIAGNOSIS — N179 Acute kidney failure, unspecified: Secondary | ICD-10-CM | POA: Diagnosis present

## 2015-09-19 DIAGNOSIS — I252 Old myocardial infarction: Secondary | ICD-10-CM

## 2015-09-19 DIAGNOSIS — R195 Other fecal abnormalities: Secondary | ICD-10-CM | POA: Diagnosis present

## 2015-09-19 DIAGNOSIS — E785 Hyperlipidemia, unspecified: Secondary | ICD-10-CM | POA: Diagnosis present

## 2015-09-19 DIAGNOSIS — Z9861 Coronary angioplasty status: Secondary | ICD-10-CM

## 2015-09-19 DIAGNOSIS — K922 Gastrointestinal hemorrhage, unspecified: Secondary | ICD-10-CM | POA: Insufficient documentation

## 2015-09-19 DIAGNOSIS — K521 Toxic gastroenteritis and colitis: Secondary | ICD-10-CM | POA: Diagnosis present

## 2015-09-19 DIAGNOSIS — I110 Hypertensive heart disease with heart failure: Secondary | ICD-10-CM | POA: Diagnosis present

## 2015-09-19 DIAGNOSIS — I2510 Atherosclerotic heart disease of native coronary artery without angina pectoris: Secondary | ICD-10-CM

## 2015-09-19 DIAGNOSIS — N938 Other specified abnormal uterine and vaginal bleeding: Secondary | ICD-10-CM | POA: Diagnosis present

## 2015-09-19 DIAGNOSIS — Z955 Presence of coronary angioplasty implant and graft: Secondary | ICD-10-CM

## 2015-09-19 DIAGNOSIS — D62 Acute posthemorrhagic anemia: Secondary | ICD-10-CM | POA: Diagnosis present

## 2015-09-19 LAB — URINALYSIS, ROUTINE W REFLEX MICROSCOPIC
Bilirubin Urine: NEGATIVE
Glucose, UA: NEGATIVE mg/dL
Ketones, ur: NEGATIVE mg/dL
LEUKOCYTES UA: NEGATIVE
NITRITE: NEGATIVE
PH: 6 (ref 5.0–8.0)
Protein, ur: NEGATIVE mg/dL
SPECIFIC GRAVITY, URINE: 1.02 (ref 1.005–1.030)
Urobilinogen, UA: 1 mg/dL (ref 0.0–1.0)

## 2015-09-19 LAB — URINE MICROSCOPIC-ADD ON

## 2015-09-19 LAB — TROPONIN I

## 2015-09-19 LAB — BASIC METABOLIC PANEL
Anion gap: 6 (ref 5–15)
BUN: 18 mg/dL (ref 6–20)
CALCIUM: 8.5 mg/dL — AB (ref 8.9–10.3)
CO2: 23 mmol/L (ref 22–32)
CREATININE: 1.05 mg/dL — AB (ref 0.44–1.00)
Chloride: 109 mmol/L (ref 101–111)
GFR, EST NON AFRICAN AMERICAN: 59 mL/min — AB (ref >60)
Glucose, Bld: 115 mg/dL — ABNORMAL HIGH (ref 65–99)
Potassium: 3.9 mmol/L (ref 3.5–5.1)
SODIUM: 138 mmol/L (ref 135–145)

## 2015-09-19 LAB — GLUCOSE, CAPILLARY
GLUCOSE-CAPILLARY: 100 mg/dL — AB (ref 65–99)
GLUCOSE-CAPILLARY: 90 mg/dL (ref 65–99)
GLUCOSE-CAPILLARY: 124 mg/dL — AB (ref 65–99)

## 2015-09-19 LAB — IRON AND TIBC
IRON: 44 ug/dL (ref 28–170)
Saturation Ratios: 19 % (ref 10.4–31.8)
TIBC: 228 ug/dL — AB (ref 250–450)
UIBC: 184 ug/dL

## 2015-09-19 LAB — TYPE AND SCREEN
ABO/RH(D): O POS
ANTIBODY SCREEN: NEGATIVE

## 2015-09-19 LAB — CBC
HCT: 29.8 % — ABNORMAL LOW (ref 36.0–46.0)
Hemoglobin: 9.6 g/dL — ABNORMAL LOW (ref 12.0–15.0)
MCH: 28.8 pg (ref 26.0–34.0)
MCHC: 32.2 g/dL (ref 30.0–36.0)
MCV: 89.5 fL (ref 78.0–100.0)
Platelets: 205 10*3/uL (ref 150–400)
RBC: 3.33 MIL/uL — AB (ref 3.87–5.11)
RDW: 14.5 % (ref 11.5–15.5)
WBC: 6.1 10*3/uL (ref 4.0–10.5)

## 2015-09-19 LAB — POC OCCULT BLOOD, ED: FECAL OCCULT BLD: POSITIVE — AB

## 2015-09-19 LAB — CBG MONITORING, ED: GLUCOSE-CAPILLARY: 98 mg/dL (ref 65–99)

## 2015-09-19 LAB — I-STAT TROPONIN, ED: TROPONIN I, POC: 0.01 ng/mL (ref 0.00–0.08)

## 2015-09-19 LAB — VITAMIN B12: VITAMIN B 12: 221 pg/mL (ref 180–914)

## 2015-09-19 LAB — RETICULOCYTES
RBC.: 3.1 MIL/uL — ABNORMAL LOW (ref 3.87–5.11)
Retic Count, Absolute: 65.1 10*3/uL (ref 19.0–186.0)
Retic Ct Pct: 2.1 % (ref 0.4–3.1)

## 2015-09-19 LAB — ABO/RH: ABO/RH(D): O POS

## 2015-09-19 LAB — FERRITIN: Ferritin: 70 ng/mL (ref 11–307)

## 2015-09-19 LAB — FOLATE: Folate: 8.2 ng/mL (ref >5.9)

## 2015-09-19 LAB — PROTIME-INR
INR: 1.15 (ref 0.00–1.49)
Prothrombin Time: 14.8 seconds (ref 11.6–15.2)

## 2015-09-19 MED ORDER — CEFTRIAXONE SODIUM 1 G IJ SOLR
1.0000 g | INTRAMUSCULAR | Status: DC
  Administered 2015-09-19: 1 g via INTRAVENOUS
  Filled 2015-09-19 (×2): qty 10

## 2015-09-19 MED ORDER — INSULIN ASPART 100 UNIT/ML ~~LOC~~ SOLN: 0.0000 [IU] | Freq: Three times a day (TID) | SUBCUTANEOUS | Status: DC

## 2015-09-19 MED ORDER — INSULIN ASPART 100 UNIT/ML ~~LOC~~ SOLN: 0.0000 [IU] | Freq: Every day | SUBCUTANEOUS | Status: DC

## 2015-09-19 MED ORDER — ATORVASTATIN CALCIUM 80 MG PO TABS
80.0000 mg | ORAL_TABLET | Freq: Every day | ORAL | Status: DC
  Administered 2015-09-19 – 2015-09-21 (×3): 80 mg via ORAL
  Filled 2015-09-19 (×4): qty 1

## 2015-09-19 MED ORDER — PEG 3350-KCL-NA BICARB-NACL 420 G PO SOLR
4000.0000 mL | Freq: Once | ORAL | Status: AC
Start: 2015-09-19 — End: 2015-09-19
  Administered 2015-09-19: 4000 mL via ORAL
  Filled 2015-09-19: qty 4000

## 2015-09-19 MED ORDER — ISOSORBIDE MONONITRATE ER 30 MG PO TB24
15.0000 mg | ORAL_TABLET | Freq: Every day | ORAL | Status: DC
  Administered 2015-09-19 – 2015-09-22 (×4): 15 mg via ORAL
  Filled 2015-09-19 (×4): qty 1

## 2015-09-19 MED ORDER — ONDANSETRON HCL 4 MG/2ML IJ SOLN: 4.0000 mg | Freq: Four times a day (QID) | INTRAMUSCULAR | Status: DC | PRN

## 2015-09-19 MED ORDER — LISINOPRIL 2.5 MG PO TABS
2.5000 mg | ORAL_TABLET | Freq: Every day | ORAL | Status: DC
  Administered 2015-09-19 – 2015-09-22 (×4): 2.5 mg via ORAL
  Filled 2015-09-19 (×5): qty 1

## 2015-09-19 MED ORDER — METOPROLOL TARTRATE 25 MG PO TABS
25.0000 mg | ORAL_TABLET | Freq: Two times a day (BID) | ORAL | Status: DC
  Administered 2015-09-19 – 2015-09-22 (×7): 25 mg via ORAL
  Filled 2015-09-19 (×7): qty 1

## 2015-09-19 MED ORDER — TICAGRELOR 90 MG PO TABS
90.0000 mg | ORAL_TABLET | Freq: Two times a day (BID) | ORAL | Status: DC
  Administered 2015-09-19 – 2015-09-21 (×6): 90 mg via ORAL
  Filled 2015-09-19 (×6): qty 1

## 2015-09-19 MED ORDER — ACETAMINOPHEN 650 MG RE SUPP
650.0000 mg | Freq: Four times a day (QID) | RECTAL | Status: DC | PRN
Start: 2015-09-19 — End: 2015-09-22

## 2015-09-19 MED ORDER — ASPIRIN EC 81 MG PO TBEC
81.0000 mg | DELAYED_RELEASE_TABLET | Freq: Every day | ORAL | Status: DC
  Administered 2015-09-19 – 2015-09-22 (×4): 81 mg via ORAL
  Filled 2015-09-19 (×4): qty 1

## 2015-09-19 MED ORDER — ONDANSETRON HCL 4 MG PO TABS: 4.0000 mg | ORAL_TABLET | Freq: Four times a day (QID) | ORAL | Status: DC | PRN

## 2015-09-19 MED ORDER — SODIUM CHLORIDE 0.9 % IV SOLN
INTRAVENOUS | Status: DC
  Administered 2015-09-19 – 2015-09-20 (×2): via INTRAVENOUS

## 2015-09-19 MED ORDER — ACETAMINOPHEN 325 MG PO TABS: 650.0000 mg | ORAL_TABLET | Freq: Four times a day (QID) | ORAL | Status: DC | PRN

## 2015-09-19 MED ORDER — SODIUM CHLORIDE 0.9 % IV SOLN: INTRAVENOUS | Status: DC

## 2015-09-19 MED ORDER — OXYCODONE HCL 5 MG PO TABS: 5.0000 mg | ORAL_TABLET | ORAL | Status: DC | PRN

## 2015-09-19 MED ORDER — SODIUM CHLORIDE 0.9 % IJ SOLN
3.0000 mL | Freq: Two times a day (BID) | INTRAMUSCULAR | Status: DC
  Administered 2015-09-20 – 2015-09-22 (×5): 3 mL via INTRAVENOUS

## 2015-09-19 MED ORDER — METRONIDAZOLE 500 MG PO TABS
500.0000 mg | ORAL_TABLET | Freq: Two times a day (BID) | ORAL | Status: DC
  Administered 2015-09-19: 500 mg via ORAL
  Filled 2015-09-19 (×2): qty 1

## 2015-09-19 NOTE — ED Notes (Signed)
Paged cardiology to [facility]; [phone]

## 2015-09-19 NOTE — Progress Notes (Signed)
RN reviewed discharge instructions with patient and answered all patient questions during discharging process.  RN wheeled patient in wheelchair to front entrance and assisted patient into private car.  Patient took all belongings at discharge.  Per patient Imdur is a new medication for her and the pharmacy that the medication was called into is closed on the weekends.  Patient does use [facility] on [address] [phone] and this pharmacy is open on weekends and patient stated she would like Imdur prescription called into at [facility] and she would pick up medication on 09/19/2015.  [facility] closed at this time, RN will pass this information on to day shift charge RN.

## 2015-09-19 NOTE — Progress Notes (Signed)
To MD on rounds : patient is requesting if he can have a " talking CBG machine " ( ie,... Autocode CBG machine)  that can help him with his blood sugar results. This will be highly beneficial since he ha visual impairment. Thanks !

## 2015-09-19 NOTE — ED Notes (Signed)
Per EMS - pt from home, angiogram yesterday 3:30pm through right groin. Pt d/c home, woke up feeling weak and had black/tarry bowel movement. Pt c/o dizziness after bowel movement. BP 98/40 (however, pt hx hypertension). 129ml NS - last BP 106/68. Hr 80bpm, NSR, CBG 121, spo2 100% RA.

## 2015-09-19 NOTE — ED Provider Notes (Signed)
CSN: [id number]     Arrival date & time 09/19/15  [id number] History   First MD Initiated Contact with Patient 09/19/15 0540     Chief Complaint  Patient presents with  . Fatigue      HPI Patient was discharged in the hospital yesterday after cardiac catheterization.  Patient has a known history coronary artery disease and underwent repeat heart catheterization on November 11 which demonstrated a patent stent and small stenosis of her diagonal.  She reports that this morning she felt generally weak and was the restroom and had a black stool.  She complains of some dizziness.  EMS report that her blood pressure is 90/40.  She is on chronic anticoagulation.  She denies urinary symptoms.  No fevers or chills.  Denies productive cough.   Past Medical History  Diagnosis Date  . Hyperlipidemia with target LDL less than 70   . STEMI (ST elevation myocardial infarction) ([place]) 06/16/2015    BMS x 2 CFX  . Diabetes mellitus without complication ([place])   . Hypertension   . CHF (congestive heart failure) ([place])   . CAD (coronary artery disease)     a. 06/2015 BMS x2 to LCx. b. recurrent CP, cath 09/18/2015 patent stent, tiny diag stenosis medical therapy.    Past Surgical History  Procedure Laterality Date  . Appendectomy    . Cesarean section    . Cardiac catheterization N/A 06/17/2015    Procedure: Left Heart Cath and Coronary Angiography;  Surgeon: Jettie Booze, MD;  Location: [facility];  Service: Cardiovascular;  Laterality: N/A;  . Cardiac catheterization  06/17/2015    Procedure: Coronary Stent Intervention;  Surgeon: Jettie Booze, MD;  Location: [facility];  Service: Cardiovascular;;   Family History  Problem Relation Age of Onset  . Stroke Mother   . Cancer Father   . Hypertension Sister   . Hyperlipidemia Sister   . Heart attack Brother   . Hypertension Brother   . Heart attack Maternal Uncle   . Asthma Paternal Uncle   . Thyroid disease Neg Hx    Social  History  Substance Use Topics  . Smoking status: Never Smoker   . Smokeless tobacco: Never Used  . Alcohol Use: No   OB History    Gravida Para Term Preterm AB TAB SAB Ectopic Multiple Living   2 2 2       2      Review of Systems  All other systems reviewed and are negative.     Allergies  Review of patient's allergies indicates no known allergies.  Home Medications   Prior to Admission medications   Medication Sig Start Date End Date Taking? Authorizing Provider  aspirin EC 81 MG tablet Take 81 mg by mouth daily.    Historical Provider, MD  atorvastatin (LIPITOR) 80 MG tablet Take 1 tablet (80 mg total) by mouth daily at 6 PM. 09/18/15   Wandra Mannan, MD  isosorbide mononitrate (IMDUR) 30 MG 24 hr tablet Take 0.5 tablets (15 mg total) by mouth daily. 09/18/15   Almyra Deforest, PA  lisinopril (PRINIVIL,ZESTRIL) 2.5 MG tablet Take 1 tablet (2.5 mg total) by mouth daily. 06/23/15   Tresa Garter, MD  metFORMIN (GLUCOPHAGE) 500 MG tablet Take 1 tablet (500 mg total) by mouth 2 (two) times daily with a meal. 06/23/15   Tresa Garter, MD  metoprolol tartrate (LOPRESSOR) 25 MG tablet Take 1 tablet (25 mg total) by mouth 2 (two) times  daily. 06/23/15   Tresa Garter, MD  metroNIDAZOLE (FLAGYL) 500 MG tablet Take 1 tablet (500 mg total) by mouth 2 (two) times daily. Patient taking differently: Take 500 mg by mouth 2 (two) times daily. For 7 days.  Started 09/17/15 09/14/15   Tanna Savoy Stinson, DO  nitroGLYCERIN (NITROSTAT) 0.4 MG SL tablet Place 1 tablet (0.4 mg total) under the tongue every 5 (five) minutes as needed for chest pain. 06/19/15   Brittainy Erie Noe, PA-C  ticagrelor (BRILINTA) 90 MG TABS tablet Take 1 tablet (90 mg total) by mouth 2 (two) times daily. 06/23/15   Tresa Garter, MD   There were no vitals taken for this visit. Physical Exam  Constitutional: She is oriented to person, place, and time. She appears well-developed and well-nourished. No distress.  HENT:   Head: Normocephalic and atraumatic.  Eyes: EOM are normal.  Neck: Normal range of motion.  Cardiovascular: Normal rate, regular rhythm and normal heart sounds.   Pulmonary/Chest: Effort normal and breath sounds normal.  Abdominal: Soft. She exhibits no distension. There is no tenderness.  Genitourinary:  Chaperone present for rectal exam.  Dark brown stool.  Hemoccult positive.  No frank blood.  Musculoskeletal: Normal range of motion.  Neurological: She is alert and oriented to person, place, and time.  Skin: Skin is warm and dry.  Psychiatric: She has a normal mood and affect. Judgment normal.  Nursing note and vitals reviewed.   ED Course  Procedures (including critical care time) Labs Review Labs Reviewed  BASIC METABOLIC PANEL - Abnormal; Notable for the following:    Glucose, Bld 115 (*)    Creatinine, Ser 1.05 (*)    Calcium 8.5 (*)    GFR calc non Af Amer 59 (*)    All other components within normal limits  CBC - Abnormal; Notable for the following:    RBC 3.33 (*)    Hemoglobin 9.6 (*)    HCT 29.8 (*)    All other components within normal limits  POC OCCULT BLOOD, ED - Abnormal; Notable for the following:    Fecal Occult Bld POSITIVE (*)    All other components within normal limits  PROTIME-INR  TROPONIN I  URINALYSIS, ROUTINE W REFLEX MICROSCOPIC (NOT AT [facility])  CBG MONITORING, ED  CBG MONITORING, ED  I-STAT TROPOININ, ED  TYPE AND SCREEN  ABO/RH   HEMOGLOBIN  Date Value Ref Range Status  09/19/2015 9.6* 12.0 - 15.0 g/dL Final  09/18/2015 10.4* 12.0 - 15.0 g/dL Final  09/17/2015 10.8* 12.0 - 15.0 g/dL Final  06/29/2015 13.0 12.0 - 15.0 g/dL Final      Imaging Review Dg Chest 2 View  09/17/2015  CLINICAL DATA:  Shortness of breath and chest tightness for 1 day EXAM: CHEST  2 VIEW COMPARISON:  December 20, 2011 FINDINGS: There is no edema or consolidation. The heart size and pulmonary vascularity are normal. No adenopathy. No pneumothorax. No bone  lesions. IMPRESSION: No edema or consolidation. Electronically Signed   By: Lowella Grip III M.D.   On: 09/17/2015 11:12   I have personally reviewed and evaluated these images and lab results as part of my medical decision-making.   EKG Interpretation   Date/Time:  Saturday September 19 2015 05:29:07 EST Ventricular Rate:  80 PR Interval:  152 QRS Duration: 69 QT Interval:  375 QTC Calculation: 433 R Axis:   62 Text Interpretation:  Sinus rhythm RSR' in V1 or V2, right VCD or RVH No  significant change  was found Confirmed by Missouri Lapaglia  MD, Orest Dygert ([id number]) on  09/19/2015 5:34:29 AM      MDM   Final diagnoses:  None    Given the dark stool and Hemoccult positive on chronic anticoagulation this could represent upper GI bleed.  Patient be admitted to the hospital for serial CBCs.  At this time she is hemodynamically stable.  No indication for emergent GI consultation.    Jola Schmidt, MD 09/19/15 [phone]

## 2015-09-19 NOTE — H&P (Signed)
[facility] History and Physical                                                                                    Virginia [name], is a 54 y.o. female  MRN: [id number]   DOB - October 08, 1961  Admit Date - 09/19/2015  Outpatient Primary MD for the patient is [name], MD  Referring MD: Venora Maples / ER  Consulting M.D: Benson Norway / Gastroenterology  With History of -  Past Medical History  Diagnosis Date  . Hyperlipidemia with target LDL less than 70   . STEMI (ST elevation myocardial infarction) ([place]) 06/16/2015    BMS x 2 CFX  . Diabetes mellitus without complication ([place])   . Hypertension   . CHF (congestive heart failure) ([place])   . CAD (coronary artery disease)     a. 06/2015 BMS x2 to LCx. b. recurrent CP, cath 09/18/2015 patent stent, tiny diag stenosis medical therapy.       Past Surgical History  Procedure Laterality Date  . Appendectomy    . Cesarean section    . Cardiac catheterization N/A 06/17/2015    Procedure: Left Heart Cath and Coronary Angiography;  Surgeon: Jettie Booze, MD;  Location: [facility];  Service: Cardiovascular;  Laterality: N/A;  . Cardiac catheterization  06/17/2015    Procedure: Coronary Stent Intervention;  Surgeon: Jettie Booze, MD;  Location: [facility];  Service: Cardiovascular;;    in for   Chief Complaint  Patient presents with  . Fatigue     HPI This is a 54 year old female patient with history of CAD status post 2 bare metal stents in August 2016, she also has a history of dyslipidemia and was recently diagnosed with diabetes in August 2016 and started on metformin. Of note in September she experienced significant dysfunctional uterine bleeding and has been evaluated as an outpatient by her GYN. She presented to the ER today after just being discharged on 11/12. During that admission she presented with chest pain and underwent a repeat cardiac catheterization which demonstrated patent stents with a small  stenosis of diagonal branch. Her nitrate dose was increased and no other medications were changed. Patient reported to the EDP that she has been feeling fatigued especially since the most recent admission and she went to the bathroom and had a black bowel movement. After experiencing this black bowel movement she reported she felt very weak and fell against the bed. EMS was called to the home and her blood pressure was 90/40 at that time. She was also reporting dizziness. Upon further questioning of the patient she reports that she has been having watery crampy diarrhea since September 2016. When asked about any new medication she reports she was started on metformin at the end of August and noticed the diarrhea began after adding this medication and she specifically notes diarrheal episodes immediately following each dosage.  In the ER patient was afebrile, BP was 107/63 with a heart rate of 71 and a spray as of 13, room air saturations were 100%, partial orthostatic vital signs were obtained from lying to seated and these were not positive. Laboratory data unremarkable  except for mild renal insufficiency with a creatinine of 1.05 with a previous creatinine of 0.9. Troponin was negative 2 collections, hemoglobin had decreased to 9.6 noting on 11/11 hemoglobin was 10.4 and previous baseline hemoglobin August 2016 was 12.9. EDP performed DRE noting brown stool without any obvious blood at this was positive when tested. EKG was unremarkable and consistent with prior readings. In addition to above issues patient reports recently she's had palpitations prior to last admission and she has previously discussed these symptoms with her cardiologist. She is also been expressing some hot flashes. She is not had any further chest pain.   Review of Systems   In addition to the HPI above,  No Fever-chills, myalgias or other constitutional symptoms No Headache, changes with Vision or hearing, new weakness, tingling,  numbness in any extremity, No problems swallowing food or Liquids, indigestion/reflux No Chest pain, Cough or Shortness of Breath, orthopnea or DOE No Abdominal pain, N/V; no melena or hematochezia No dysuria, hematuria or flank pain No new skin rashes, lesions, masses or bruises, No new joints pains-aches No recent weight gain or loss No polyuria, polydypsia or polyphagia,  *A full 10 point Review of Systems was done, except as stated above, all other Review of Systems were negative.  Social History Social History  Substance Use Topics  . Smoking status: Never Smoker   . Smokeless tobacco: Never Used  . Alcohol Use: No    Resides at: Private residence  Lives with: Spouse  Ambulatory status: Without assistive devices   Family History Family History  Problem Relation Age of Onset  . Stroke Mother   . Cancer Father   . Hypertension Sister   . Hyperlipidemia Sister   . Heart attack Brother   . Hypertension Brother   . Heart attack Maternal Uncle   . Asthma Paternal Uncle   . Thyroid disease Neg Hx      Prior to Admission medications   Medication Sig Start Date End Date Taking? Authorizing Provider  aspirin EC 81 MG tablet Take 81 mg by mouth daily.   Yes Historical Provider, MD  atorvastatin (LIPITOR) 80 MG tablet Take 1 tablet (80 mg total) by mouth daily at 6 PM. 09/18/15  Yes Wandra Mannan, MD  isosorbide mononitrate (IMDUR) 30 MG 24 hr tablet Take 0.5 tablets (15 mg total) by mouth daily. 09/18/15  Yes Almyra Deforest, PA  lisinopril (PRINIVIL,ZESTRIL) 2.5 MG tablet Take 1 tablet (2.5 mg total) by mouth daily. 06/23/15  Yes Tresa Garter, MD  metFORMIN (GLUCOPHAGE) 500 MG tablet Take 1 tablet (500 mg total) by mouth 2 (two) times daily with a meal. 06/23/15  Yes Olugbemiga E Doreene Burke, MD  metoprolol tartrate (LOPRESSOR) 25 MG tablet Take 1 tablet (25 mg total) by mouth 2 (two) times daily. 06/23/15  Yes Tresa Garter, MD  metroNIDAZOLE (FLAGYL) 500 MG tablet Take 1  tablet (500 mg total) by mouth 2 (two) times daily. Patient taking differently: Take 500 mg by mouth 2 (two) times daily. For 7 days.  Started 09/17/15 09/14/15  Yes Tanna Savoy Stinson, DO  nitroGLYCERIN (NITROSTAT) 0.4 MG SL tablet Place 1 tablet (0.4 mg total) under the tongue every 5 (five) minutes as needed for chest pain. 06/19/15  Yes Brittainy Erie Noe, PA-C  ticagrelor (BRILINTA) 90 MG TABS tablet Take 1 tablet (90 mg total) by mouth 2 (two) times daily. 06/23/15  Yes Tresa Garter, MD    No Known Allergies  Physical Exam  Vitals  Blood  pressure 120/70, pulse 72, temperature 98.7 F (37.1 C), temperature source Oral, resp. rate 16, height 5\' 6"  (1.676 m), weight 173 lb 1.6 oz (78.518 kg), SpO2 100 %.   General:  In no acute distress, appears healthy and well nourished  Psych:  Normal affect, Denies Suicidal or Homicidal ideations, Awake Alert, Oriented X 3. Speech and thought patterns are clear and appropriate, no apparent short term memory deficits  Neuro:   No focal neurological deficits, CN II through XII intact, Strength 5/5 all 4 extremities, Sensation intact all 4 extremities.  ENT:  Ears and Eyes appear Normal, Conjunctivae clear, PER. Moist oral mucosa without erythema or exudates.  Neck:  Supple, No lymphadenopathy appreciated  Respiratory:  Symmetrical chest wall movement, Good air movement bilaterally, CTAB. Room Air  Cardiac:  RRR, No Murmurs, no LE edema noted, no JVD, No carotid bruits, peripheral pulses palpable at 2+  Abdomen:  Positive bowel sounds, Soft, Non tender, Non distended,  No masses appreciated, no obvious hepatosplenomegaly  Skin:  No Cyanosis, Normal Skin Turgor, No Skin Rash or Bruise.  Extremities: Symmetrical without obvious trauma or injury,  no effusions.  Data Review  CBC  Recent Labs Lab 09/17/15 1112 09/18/15 1939 09/19/15 0547  WBC 5.5 6.1 6.1  HGB 10.8* 10.4* 9.6*  HCT 33.8* 32.7* 29.8*  PLT 221 194 205  MCV 90.4 90.1  89.5  MCH 28.9 28.7 28.8  MCHC 32.0 31.8 32.2  RDW 14.3 14.4 14.5  LYMPHSABS 2.1  --   --   MONOABS 0.3  --   --   EOSABS 0.3  --   --   BASOSABS 0.0  --   --     Chemistries   Recent Labs Lab 09/17/15 1112 09/18/15 0408 09/18/15 1939 09/19/15 0547  NA 137 138  --  138  K 4.1 3.9  --  3.9  CL 103 105  --  109  CO2 26 26  --  23  GLUCOSE 103* 92  --  115*  BUN 6 12  --  18  CREATININE 1.06* 0.93 0.90 1.05*  CALCIUM 9.2 8.9  --  8.5*  AST 15  --   --   --   ALT 14  --   --   --   ALKPHOS 87  --   --   --   BILITOT 0.5  --   --   --     estimated creatinine clearance is 64 mL/min (by C-G formula based on Cr of 1.05).  No results for input(s): TSH, T4TOTAL, T3FREE, THYROIDAB in the last 72 hours.  Invalid input(s): FREET3  Coagulation profile  Recent Labs Lab 09/18/15 1106 09/19/15 0547  INR 1.12 1.15    No results for input(s): DDIMER in the last 72 hours.  Cardiac Enzymes  Recent Labs Lab 09/18/15 0408 09/18/15 1106 09/19/15 0547  TROPONINI <0.03 <0.03 <0.03    Invalid input(s): POCBNP  Urinalysis    Component Value Date/Time   COLORURINE YELLOW 06/29/2015 1550   APPEARANCEUR CLOUDY* 06/29/2015 1550   LABSPEC 1.022 06/29/2015 1550   PHURINE 5.5 06/29/2015 1550   GLUCOSEU NEGATIVE 06/29/2015 1550   HGBUR SMALL* 06/29/2015 1550   BILIRUBINUR NEGATIVE 06/29/2015 1550   KETONESUR 15* 06/29/2015 1550   PROTEINUR NEGATIVE 06/29/2015 1550   UROBILINOGEN 1.0 06/29/2015 1550   NITRITE NEGATIVE 06/29/2015 1550   LEUKOCYTESUR NEGATIVE 06/29/2015 1550    Imaging results:   Dg Chest 2 View  09/17/2015  CLINICAL DATA:  Shortness  of breath and chest tightness for 1 day EXAM: CHEST  2 VIEW COMPARISON:  December 20, 2011 FINDINGS: There is no edema or consolidation. The heart size and pulmonary vascularity are normal. No adenopathy. No pneumothorax. No bone lesions. IMPRESSION: No edema or consolidation. Electronically Signed   By: Lowella Grip III  M.D.   On: 09/17/2015 11:12     EKG: (Independently reviewed)  sinus rhythm with ventricular rate 80 bpm, QTC 433 ms, subtle J-point elevation in septal lateral leads but no definitive ST segment elevation or T-wave changes that would be concerning for ischemia and EKG essentially and change from prior   Assessment & Plan  Principal Problem:   Symptomatic anemia -Admit to telemetry given underlying cardiac history -Hemoglobin has trended downward from 12.9 to 9.6 since August -Suspect etiology primary from dysfunctional uterine bleeding although now has new issue of black alternating with brown heme positive stool -Not demonstrating any type of brisk or bright red blood so can check CBC daily at this juncture -No indication to transfuse at this juncture -Check anemia panel; given suspicion that this is chronic progressive primary etiology anemia panel will be helpful in delineating  Active Problems:   Vasovagal near syncope -Suspect is multifactorial related to symptomatic anemia in setting of new increase in nitrate dosage -Incomplete orthostatic vital signs in the ER so I have requested an additional set after arrival to floor -Ambulate with assistance -Initial symptoms occurred after patient experienced a bowel movement   Heme positive stool -As above -No brisk GI bleeding -Gastroenterology has been consulted; likely will need screening colonoscopy at some point although since she does not have brisk bleeding and her anemia seems to be driven by GYN source it is likely this can be accomplished in the outpatient setting -Clear liquids until evaluated by gastroenterology    Dysfunctional uterine bleeding -Has been evaluated in the outpatient setting by her GYN -Prior history of myomectomy for large fibroid 20 years prior -Transvaginal/complete pelvic ultrasound pending but had to be reordered as patient was recently in hospital and had to cancel scheduled test -Set she is  presenting with symptomatic anemia we'll go ahead and proceed with transvaginal and complete pelvic ultrasonography -Patient reports that last known menstrual period was in October 2010, in March 2015 she had very light abnormal bleeding for about 5 days and in September of this year she had 7 days of very heavy bleeding    CAD S/P CFX BMS (? med complinace) x 2 06/16/15 -Recent admit and status post cardiac catheterization -Found to have small diagonal stenosis and since presented with chest pain nitrate dosage increased last admission -I spoke with cardiology/Dr. Tressia Miners.Turner about patient's current symptomatology. Since the patient is not having active in brisk bleeding and hemoglobin has been drifting down slowly she recommends continuing Brilinta and aspirin for now and no indication at this point for formal cardiac consultation. Request we recontact cardiology if symptoms worsen ie if bleeding worsens may need to re evaluate use of Brilinta/ASA. 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 in August 2016 -Have discontinued metformin -Was started on Flagyl as an outpatient on 11/7 so we'll continue this for now  Hyperlipidemia with target LDL less than 70 -Continue statin    Diabetes mellitus type 2, controlled ([place]) -Hemoglobin A1c was 6.6 in August and prompted primary care physician to initiate metformin -As noted above patient has had significant issues with drug induced diarrhea so we will discontinue metformin at this point and likely recommended to never use this medication in the future -Repeat hemoglobin A1c -Suspect at this juncture unless hemoglobin A1c  markedly elevated patient can be managed with diet alone otherwise would initiate sulfonic urea -SSI for now    DVT Prophylaxis: SCDs in setting of heme positive stools  Family Communication: No family at bedside    Code Status:  Full code  Condition:  Stable  Discharge disposition: Once medically stable anticipate discharge back to home environment hopefully will be ready for discharge in the next 24-48 hours  Time spent in minutes : 60      Glade Strausser L. ANP on 09/19/2015 at 9:07 AM  Between 7am to 7pm - Pager - [phone]  After 7pm go to [email or web address] - password [email or web address]  And look for the night coverage person covering me after hours  [facility]

## 2015-09-19 NOTE — ED Notes (Signed)
Pt cbg 98

## 2015-09-19 NOTE — Consult Note (Signed)
Reason for Consult: Progressive anemia and heme positive stool Referring Physician: [facility]  Benna Dunks HPI: This is a 54 year old female with a PMH of STEMI 06/16/2015 s/p bare metal stent placement, DM, HTN, and hyperlipidemia readmitted with complaints of fatigue.  She was recently admitted for complaints of chest pressure and a catherization was performed with findings of a patent stents.  She was discharged only to represent in the ER hours later with complaints of fatigue.  The patient reports that both of her legs gave out, one after the other.  No reports of bleeding from the groin region.  Her blood work was obtained and she was noted to have a progressive anemia from 13 down to 9.6 g/dL.  Per her report, she had blood work as an outpatient with findings of a decline in her HGB.  Work up in the ER identified brown stool that was heme positive, but she reports having intermittent melena.  Additionally, she has a history of dysfunctional uterine bleeding.  In the distant past she recalls a colonoscopy and a polyp was identified, but she cannot remember any further details.  She remains on Brilinta.  Past Medical History  Diagnosis Date  . Hyperlipidemia with target LDL less than 70   . STEMI (ST elevation myocardial infarction) ([place]) 06/16/2015    BMS x 2 CFX  . Diabetes mellitus without complication ([place])   . Hypertension   . CHF (congestive heart failure) ([place])   . CAD (coronary artery disease)     a. 06/2015 BMS x2 to LCx. b. recurrent CP, cath 09/18/2015 patent stent, tiny diag stenosis medical therapy.     Past Surgical History  Procedure Laterality Date  . Appendectomy    . Cesarean section    . Cardiac catheterization N/A 06/17/2015    Procedure: Left Heart Cath and Coronary Angiography;  Surgeon: Jettie Booze, MD;  Location: [facility] [facility];  Service: Cardiovascular;  Laterality: N/A;  . Cardiac catheterization  06/17/2015    Procedure: Coronary Stent  Intervention;  Surgeon: Jettie Booze, MD;  Location: [facility];  Service: Cardiovascular;;    Family History  Problem Relation Age of Onset  . Stroke Mother   . Cancer Father   . Hypertension Sister   . Hyperlipidemia Sister   . Heart attack Brother   . Hypertension Brother   . Heart attack Maternal Uncle   . Asthma Paternal Uncle   . Thyroid disease Neg Hx     Social History:  reports that she has never smoked. She has never used smokeless tobacco. She reports that she does not drink alcohol or use illicit drugs.  Allergies: No Known Allergies  Medications:  Scheduled: . aspirin EC  81 mg Oral Daily  . atorvastatin  80 mg Oral q1800  . cefTRIAXone (ROCEPHIN)  IV  1 g Intravenous Q24H  . insulin aspart  0-5 Units Subcutaneous QHS  . insulin aspart  0-9 Units Subcutaneous TID WC  . isosorbide mononitrate  15 mg Oral Daily  . lisinopril  2.5 mg Oral Daily  . metoprolol tartrate  25 mg Oral BID  . metroNIDAZOLE  500 mg Oral BID  . sodium chloride  3 mL Intravenous Q12H  . ticagrelor  90 mg Oral BID   Continuous: . sodium chloride 75 mL/hr at 09/19/15 1044    Results for orders placed or performed during the hospital encounter of 09/19/15 (from the past 24 hour(s))  CBG monitoring, ED  Status: None   Collection Time: 09/19/15  5:33 AM  Result Value Ref Range   Glucose-Capillary 98 65 - 99 mg/dL  Basic metabolic panel     Status: Abnormal   Collection Time: 09/19/15  5:47 AM  Result Value Ref Range   Sodium 138 135 - 145 mmol/L   Potassium 3.9 3.5 - 5.1 mmol/L   Chloride 109 101 - 111 mmol/L   CO2 23 22 - 32 mmol/L   Glucose, Bld 115 (H) 65 - 99 mg/dL   BUN 18 6 - 20 mg/dL   Creatinine, Ser 1.05 (H) 0.44 - 1.00 mg/dL   Calcium 8.5 (L) 8.9 - 10.3 mg/dL   GFR calc non Af Amer 59 (L) >60 mL/min   GFR calc Af Amer >60 >60 mL/min   Anion gap 6 5 - 15  CBC     Status: Abnormal   Collection Time: 09/19/15  5:47 AM  Result Value Ref Range   WBC 6.1  4.0 - 10.5 K/uL   RBC 3.33 (L) 3.87 - 5.11 MIL/uL   Hemoglobin 9.6 (L) 12.0 - 15.0 g/dL   HCT 29.8 (L) 36.0 - 46.0 %   MCV 89.5 78.0 - 100.0 fL   MCH 28.8 26.0 - 34.0 pg   MCHC 32.2 30.0 - 36.0 g/dL   RDW 14.5 11.5 - 15.5 %   Platelets 205 150 - 400 K/uL  Protime-INR - (order if Patient is taking Coumadin / Warfarin)     Status: None   Collection Time: 09/19/15  5:47 AM  Result Value Ref Range   Prothrombin Time 14.8 11.6 - 15.2 seconds   INR 1.15 0.00 - 1.49  Troponin I     Status: None   Collection Time: 09/19/15  5:47 AM  Result Value Ref Range   Troponin I <0.03 <0.031 ng/mL  Type and screen [place]     Status: None   Collection Time: 09/19/15  5:47 AM  Result Value Ref Range   ABO/RH(D) O POS    Antibody Screen NEG    Sample Expiration 09/22/2015   ABO/Rh     Status: None   Collection Time: 09/19/15  5:47 AM  Result Value Ref Range   ABO/RH(D) O POS   I-stat troponin, ED (not at [facility])     Status: None   Collection Time: 09/19/15  5:54 AM  Result Value Ref Range   Troponin i, poc 0.01 0.00 - 0.08 ng/mL   Comment 3          POC occult blood, ED Provider will collect     Status: Abnormal   Collection Time: 09/19/15  5:54 AM  Result Value Ref Range   Fecal Occult Bld POSITIVE (A) NEGATIVE     No results found.  ROS:  As stated above in the HPI otherwise negative.  Blood pressure 120/70, pulse 72, temperature 98.7 F (37.1 C), temperature source Oral, resp. rate 16, height 5\' 6"  (1.676 m), weight 78.518 kg (173 lb 1.6 oz), SpO2 100 %.    PE: Gen: NAD, Alert and Oriented HEENT:  [place]/AT, EOMI Neck: Supple, no LAD Lungs: CTA Bilaterally CV: RRR without M/G/R ABM: Soft, NTND, +BS Ext: No C/C/E  Assessment/Plan: 1) Progressive anemia. 2) Heme positive stool. 3) CAD s/p stenting.   I will perform further work up tomorrow AM.  She has to remain on Brilinta and this will help to identify any overt bleeding source.  Plan: 1)  EGD/Colonoscopy.  Fowler Antos D  09/19/2015, 11:35 AM

## 2015-09-20 ENCOUNTER — Encounter (HOSPITAL_COMMUNITY)
Admission: EM | Disposition: A | Discharge status: Home / Self Care | Payer: Self-pay | Source: Home / Self Care | Attending: Internal Medicine

## 2015-09-20 ENCOUNTER — Encounter (HOSPITAL_COMMUNITY): Payer: Self-pay

## 2015-09-20 HISTORY — PX: ESOPHAGOGASTRODUODENOSCOPY: SHX5428

## 2015-09-20 HISTORY — PX: COLONOSCOPY: SHX5424

## 2015-09-20 LAB — GLUCOSE, CAPILLARY
GLUCOSE-CAPILLARY: 89 mg/dL (ref 65–99)
GLUCOSE-CAPILLARY: 79 mg/dL (ref 65–99)
GLUCOSE-CAPILLARY: 73 mg/dL (ref 65–99)
Glucose-Capillary: 104 mg/dL — ABNORMAL HIGH (ref 65–99)

## 2015-09-20 LAB — HEMOGLOBIN AND HEMATOCRIT, BLOOD
HCT: 27.7 % — ABNORMAL LOW (ref 36.0–46.0)
HEMATOCRIT: 27.6 % — AB (ref 36.0–46.0)
HEMOGLOBIN: 8.7 g/dL — AB (ref 12.0–15.0)
Hemoglobin: 8.9 g/dL — ABNORMAL LOW (ref 12.0–15.0)

## 2015-09-20 LAB — BASIC METABOLIC PANEL
Anion gap: 7 (ref 5–15)
BUN: 7 mg/dL (ref 6–20)
CALCIUM: 8.5 mg/dL — AB (ref 8.9–10.3)
CHLORIDE: 110 mmol/L (ref 101–111)
CO2: 22 mmol/L (ref 22–32)
CREATININE: 0.91 mg/dL (ref 0.44–1.00)
GFR calc Af Amer: >60 mL/min (ref >60)
GFR calc non Af Amer: >60 mL/min (ref >60)
Glucose, Bld: 115 mg/dL — ABNORMAL HIGH (ref 65–99)
Potassium: 4 mmol/L (ref 3.5–5.1)
SODIUM: 139 mmol/L (ref 135–145)

## 2015-09-20 LAB — CBC
HCT: 28 % — ABNORMAL LOW (ref 36.0–46.0)
Hemoglobin: 8.7 g/dL — ABNORMAL LOW (ref 12.0–15.0)
MCH: 28.6 pg (ref 26.0–34.0)
MCHC: 31.1 g/dL (ref 30.0–36.0)
MCV: 92.1 fL (ref 78.0–100.0)
PLATELETS: 186 10*3/uL (ref 150–400)
RBC: 3.04 MIL/uL — ABNORMAL LOW (ref 3.87–5.11)
RDW: 14.9 % (ref 11.5–15.5)
WBC: 6 10*3/uL (ref 4.0–10.5)

## 2015-09-20 SURGERY — EGD (ESOPHAGOGASTRODUODENOSCOPY)
Anesthesia: Moderate Sedation

## 2015-09-20 MED ORDER — MIDAZOLAM HCL 5 MG/5ML IJ SOLN: INTRAMUSCULAR | Status: DC | PRN

## 2015-09-20 MED ORDER — FENTANYL CITRATE (PF) 100 MCG/2ML IJ SOLN
INTRAMUSCULAR | Status: DC | PRN
  Administered 2015-09-20 (×2): 25 ug via INTRAVENOUS

## 2015-09-20 MED ORDER — MIDAZOLAM HCL 10 MG/2ML IJ SOLN
INTRAMUSCULAR | Status: DC | PRN
  Administered 2015-09-20: 2 mg via INTRAVENOUS
  Administered 2015-09-20: 1 mg via INTRAVENOUS
  Administered 2015-09-20: 2 mg via INTRAVENOUS

## 2015-09-20 MED ORDER — PANTOPRAZOLE SODIUM 40 MG IV SOLR
40.0000 mg | Freq: Two times a day (BID) | INTRAVENOUS | Status: DC
  Administered 2015-09-20 – 2015-09-22 (×5): 40 mg via INTRAVENOUS
  Filled 2015-09-20 (×5): qty 40

## 2015-09-20 MED ORDER — DIPHENHYDRAMINE HCL 50 MG/ML IJ SOLN
INTRAMUSCULAR | Status: AC
  Filled 2015-09-20: qty 1

## 2015-09-20 MED ORDER — FENTANYL CITRATE (PF) 100 MCG/2ML IJ SOLN
INTRAMUSCULAR | Status: AC
  Filled 2015-09-20: qty 2

## 2015-09-20 MED ORDER — SODIUM CHLORIDE 0.9 % IV SOLN
INTRAVENOUS | Status: AC
  Administered 2015-09-20: 11:00:00 via INTRAVENOUS

## 2015-09-20 MED ORDER — MIDAZOLAM HCL 5 MG/ML IJ SOLN
INTRAMUSCULAR | Status: AC
  Filled 2015-09-20: qty 2

## 2015-09-20 NOTE — Progress Notes (Addendum)
Patient Demographics:    Virginia [name], is a 54 y.o. female, DOB - [birth year], [id number]  Admit date - 09/19/2015   Admitting Physician Waldemar Dickens, MD  Outpatient Primary MD for the patient is Virginia Chessman, MD  LOS - 1   Chief Complaint  Patient presents with  . Fatigue        Subjective:    [name] today has, No headache, No chest pain, No abdominal pain - No Nausea, No new weakness tingling or numbness, No Cough - SOB. Generalized weakness   Assessment  & Plan :     1. Generalized weakness due to symptomatic UGI bleeding related anemia. Underwent colonoscopy and EGD, colonoscopy unremarkable, EGD shows bleeding polyp which was hemoclipped by GI physician Dr. Benson Norway on 09/20/2015. Currently not requiring transfusion we'll continue to monitor H&H, IV PPI, type screen. Clear liquid diet. Increase activity and monitor.   2. Dysfunctional uterine bleeding. Uterine and vaginal ultrasound unremarkable outpatient OB follow-up postdischarge. Anemia panel stable no iron deficiency.   3. CAD status post metal stent placement x 2 on 06/16/2015 - no acute issues, chest pain-free, for now we have to continue aspirin and [place] to. Continue beta blocker, ACE inhibitor, nitrates and statin. Case was discussed upon admission either admitting team with Dr. Golden Hurter.   4. Mild acute renal failure due to anemia resolved with hydration.   5. Dyslipidemia. On statin continue.   6. Chr. intermittent diarrhea. Likely due to metformin which has been stopped. Hold antibiotics and monitor.    7.DM type II. On sliding-scale continue.  Lab Results  Component Value Date   HGBA1C 6.6* 06/17/2015    CBG (last 3)   Recent Labs  09/19/15 1642 09/19/15 2047 09/20/15 0739  GLUCAP 90 124*  89     Code Status : Full  Family Communication  : none  Disposition Plan  : Home 1-2 days  Consults  :  [facility]  Procedures  :   Colonoscopy unremarkable  EGD  1) Bleeding penduculated fundic polyp s/p hemoclipping.  RECOMMENDATIONS: 1) Follow HGB. 2) Repeating the EGD in the near future to reevaluate the status of the polyp. _______________________________ eSigned: Carol Ada, MD 09/20/2015 9:55 AM  DVT Prophylaxis  :   SCDs   Lab Results  Component Value Date   PLT 186 09/20/2015    Inpatient Medications  Scheduled Meds: . aspirin EC  81 mg Oral Daily  . atorvastatin  80 mg Oral q1800  . cefTRIAXone (ROCEPHIN)  IV  1 g Intravenous Q24H  . insulin aspart  0-5 Units Subcutaneous QHS  . insulin aspart  0-9 Units Subcutaneous TID WC  . isosorbide mononitrate  15 mg Oral Daily  . lisinopril  2.5 mg Oral Daily  . metoprolol tartrate  25 mg Oral BID  . metroNIDAZOLE  500 mg Oral BID  . sodium chloride  3 mL Intravenous Q12H  . ticagrelor  90 mg Oral BID   Continuous Infusions: . sodium chloride     PRN Meds:.acetaminophen **OR** acetaminophen, ondansetron **OR** ondansetron (ZOFRAN) IV, oxyCODONE  Antibiotics  :     Anti-infectives    Start     Dose/Rate Route Frequency Ordered Stop   09/19/15 1000  metroNIDAZOLE (FLAGYL) tablet  500 mg     500 mg Oral 2 times daily 09/19/15 0942     09/19/15 0945  cefTRIAXone (ROCEPHIN) 1 g in dextrose 5 % 50 mL IVPB     1 g 100 mL/hr over 30 Minutes Intravenous Every 24 hours 09/19/15 0942          Objective:   Filed Vitals:   09/20/15 0953 09/20/15 0955 09/20/15 1000 09/20/15 1005  BP: 135/59  135/65   Pulse: 78 82 78 81  Temp: 98.4 F (36.9 C)     TempSrc:      Resp: 16 17 14 18   Height:      Weight:      SpO2: 100% 100% 100% 99%    Wt Readings from Last 3 Encounters:  09/20/15 77.928 kg (171 lb 12.8 oz)  09/18/15 77.747 kg (171 lb 6.4 oz)  09/10/15 78.835 kg (173 lb 12.8 oz)     Intake/Output  Summary (Last 24 hours) at 09/20/15 1032 Last data filed at 09/20/15 0202  Gross per 24 hour  Intake   4360 ml  Output   3400 ml  Net    960 ml     Physical Exam  Awake Alert, Oriented X 3, No new F.N deficits, Normal affect [place].AT,PERRAL Supple Neck,No JVD, No cervical lymphadenopathy appriciated.  Symmetrical Chest wall movement, Good air movement bilaterally, CTAB RRR,No Gallops,Rubs or new Murmurs, No Parasternal Heave +ve B.Sounds, Abd Soft, No tenderness, No organomegaly appriciated, No rebound - guarding or rigidity. No Cyanosis, Clubbing or edema, No new Rash or bruise       Data Review:   Micro Results Recent Results (from the past 240 hour(s))  Wet prep, genital     Status: Abnormal   Collection Time: 09/10/15  2:32 PM  Result Value Ref Range Status   Yeast Wet Prep HPF POC NONE SEEN NONE SEEN Final   Trich, Wet Prep NONE SEEN NONE SEEN Final   Clue Cells Wet Prep HPF POC MOD (A) NONE SEEN Final   WBC, Wet Prep HPF POC FEW NONE SEEN Final    Radiology Reports Dg Chest 2 View  09/17/2015  CLINICAL DATA:  Shortness of breath and chest tightness for 1 day EXAM: CHEST  2 VIEW COMPARISON:  December 20, 2011 FINDINGS: There is no edema or consolidation. The heart size and pulmonary vascularity are normal. No adenopathy. No pneumothorax. No bone lesions. IMPRESSION: No edema or consolidation. Electronically Signed   By: Lowella Grip III M.D.   On: 09/17/2015 11:12   US Transvaginal Non-ob  09/19/2015  CLINICAL DATA:  Dysfunctional uterine bleeding. EXAM: TRANSABDOMINAL AND TRANSVAGINAL ULTRASOUND OF PELVIS TECHNIQUE: Both transabdominal and transvaginal ultrasound examinations of the pelvis were performed. Transabdominal technique was performed for global imaging of the pelvis including uterus, ovaries, adnexal regions, and pelvic cul-de-sac. It was necessary to proceed with endovaginal exam following the transabdominal exam to visualize the uterus and right adnexa to  better advantage. COMPARISON:  CT, 06/29/2015 FINDINGS: Uterus Measurements: 14.2 x 7.5 x 6.4 cm. Multiple uterine masses consistent fibroids. Largest arises from the posterior right uterine fundus measuring 3.5 x 2.9 x 2.9 cm. Echogenic material within this is consistent with calcifications, which were present on the prior CT. There are at least 3 additional smaller fibroids. Endometrium Not visualized. Right ovary Not visualized.  No right adnexal mass. Left ovary Not visualized.  No left adnexal mass. Other findings No free fluid. IMPRESSION: 1. Enlarged uterus with multiple fibroids. 2. Endometrium not  visualized. Neither ovary visualized. No adnexal masses. Electronically Signed   By: Lajean Manes M.D.   On: 09/19/2015 13:50   US Pelvis Complete  09/19/2015  CLINICAL DATA:  Dysfunctional uterine bleeding. EXAM: TRANSABDOMINAL AND TRANSVAGINAL ULTRASOUND OF PELVIS TECHNIQUE: Both transabdominal and transvaginal ultrasound examinations of the pelvis were performed. Transabdominal technique was performed for global imaging of the pelvis including uterus, ovaries, adnexal regions, and pelvic cul-de-sac. It was necessary to proceed with endovaginal exam following the transabdominal exam to visualize the uterus and right adnexa to better advantage. COMPARISON:  CT, 06/29/2015 FINDINGS: Uterus Measurements: 14.2 x 7.5 x 6.4 cm. Multiple uterine masses consistent fibroids. Largest arises from the posterior right uterine fundus measuring 3.5 x 2.9 x 2.9 cm. Echogenic material within this is consistent with calcifications, which were present on the prior CT. There are at least 3 additional smaller fibroids. Endometrium Not visualized. Right ovary Not visualized.  No right adnexal mass. Left ovary Not visualized.  No left adnexal mass. Other findings No free fluid. IMPRESSION: 1. Enlarged uterus with multiple fibroids. 2. Endometrium not visualized. Neither ovary visualized. No adnexal masses. Electronically Signed    By: Lajean Manes M.D.   On: 09/19/2015 13:50     CBC  Recent Labs Lab 09/17/15 1112 09/18/15 1939 09/19/15 0547 09/20/15 0551  WBC 5.5 6.1 6.1 6.0  HGB 10.8* 10.4* 9.6* 8.7*  HCT 33.8* 32.7* 29.8* 28.0*  PLT 221 194 205 186  MCV 90.4 90.1 89.5 92.1  MCH 28.9 28.7 28.8 28.6  MCHC 32.0 31.8 32.2 31.1  RDW 14.3 14.4 14.5 14.9  LYMPHSABS 2.1  --   --   --   MONOABS 0.3  --   --   --   EOSABS 0.3  --   --   --   BASOSABS 0.0  --   --   --     Chemistries   Recent Labs Lab 09/17/15 1112 09/18/15 0408 09/18/15 1939 09/19/15 0547 09/20/15 0551  NA 137 138  --  138 139  K 4.1 3.9  --  3.9 4.0  CL 103 105  --  109 110  CO2 26 26  --  23 22  GLUCOSE 103* 92  --  115* 115*  BUN 6 12  --  18 7  CREATININE 1.06* 0.93 0.90 1.05* 0.91  CALCIUM 9.2 8.9  --  8.5* 8.5*  AST 15  --   --   --   --   ALT 14  --   --   --   --   ALKPHOS 87  --   --   --   --   BILITOT 0.5  --   --   --   --    ------------------------------------------------------------------------------------------------------------------ estimated creatinine clearance is 73.6 mL/min (by C-G formula based on Cr of 0.91). ------------------------------------------------------------------------------------------------------------------ No results for input(s): HGBA1C in the last 72 hours. ------------------------------------------------------------------------------------------------------------------  Recent Labs  09/18/15 0408  CHOL 95  HDL 29*  LDLCALC 50  TRIG 81  CHOLHDL 3.3   ------------------------------------------------------------------------------------------------------------------ No results for input(s): TSH, T4TOTAL, T3FREE, THYROIDAB in the last 72 hours.  Invalid input(s): FREET3 ------------------------------------------------------------------------------------------------------------------  Recent Labs  09/19/15 1244  VITAMINB12 221  FOLATE 8.2  FERRITIN 70  TIBC 228*  IRON 44    RETICCTPCT 2.1    Coagulation profile  Recent Labs Lab 09/18/15 1106 09/19/15 0547  INR 1.12 1.15    No results for input(s): DDIMER in the last 72 hours.  Cardiac Enzymes  Recent Labs Lab 09/18/15 0408 09/18/15 1106 09/19/15 0547  TROPONINI <0.03 <0.03 <0.03   ------------------------------------------------------------------------------------------------------------------ Invalid input(s): POCBNP   Time Spent in minutes  35   Emberlee Sortino K M.D on 09/20/2015 at 10:32 AM  Between 7am to 7pm - Pager - [phone]  After 7pm go to [email or web address] - password [facility]  [facility] -  Office  [phone]

## 2015-09-20 NOTE — Interval H&P Note (Signed)
History and Physical Interval Note:  09/20/2015 9:07 AM  Virginia [name]  has presented today for surgery, with the diagnosis of Anemia and Melena  The various methods of treatment have been discussed with the patient and family. After consideration of risks, benefits and other options for treatment, the patient has consented to  Procedure(s): ESOPHAGOGASTRODUODENOSCOPY (EGD) (N/A) COLONOSCOPY (N/A) as a surgical intervention .  The patient's history has been reviewed, patient examined, no change in status, stable for surgery.  I have reviewed the patient's chart and labs.  Questions were answered to the patient's satisfaction.     Antoinetta Berrones D

## 2015-09-20 NOTE — Op Note (Signed)
[facility] [place], [id number]   ENDOSCOPY PROCEDURE REPORT  PATIENT: Virginia, [name]  MR#: [id number] BIRTHDATE: 11/16/1960 , 56  yrs. old GENDER: female ENDOSCOPIST:Bahja Bence Benson Norway, MD REFERRED BY: PROCEDURE DATE:  10-16-2015 PROCEDURE:   EGD w/ control of bleeding ASA CLASS:    Class III INDICATIONS:  GI bleed MEDICATION: Versed 5 mg IV and Fentanyl 50 mcg IV TOPICAL ANESTHETIC:   none  DESCRIPTION OF PROCEDURE:   After the risks and benefits of the procedure were explained, informed consent was obtained.  The endoscope was introduced through the mouth  and advanced to the second portion of the duodenum .  The instrument was slowly withdrawn as the mucosa was fully examined. Estimated blood loss is zero unless otherwise noted in this procedure report.   FINDINGS: The esophagus was normal.  No evidence of any inflammation.  Upon entry into the gastric lumen a large  wide based pedunculated fundic polyp was identified.  There was active oozing from the apex of the polyp.  Three hemoclips were deployed at the base of the polyp, which arrested the bleeding.  However, the polyp was not resected as she takes Brilinta and requires the medication as she had recent cardiac stent placement.  Hopefully the polyp will slowly necrosis without any further bleeding sequalae.  No other abnormalities were found in the gastric lumen or the duodenum.    Retroflexed views are as described.    The scope was then withdrawn from the patient and the procedure completed.  COMPLICATIONS: There were no immediate complications.  ENDOSCOPIC IMPRESSION: 1) Bleeding penduculated fundic polyp s/p hemoclipping.  RECOMMENDATIONS: 1) Follow HGB. 2) Repeating the EGD in the near future to reevaluate the status of the polyp. _______________________________ eSigned:  Carol Ada, MD 2015-10-16 9:55 AM    cc: CPT CODES: ICD CODES:

## 2015-09-20 NOTE — Progress Notes (Signed)
NP [facility] notified of hgb 8.7. Pt scheduled for egd/colonoscopy today.

## 2015-09-20 NOTE — H&P (View-Only) (Signed)
Reason for Consult: Progressive anemia and heme positive stool Referring Physician: [facility]  Benna Dunks HPI: This is a 54 year old female with a PMH of STEMI 06/16/2015 s/p bare metal stent placement, DM, HTN, and hyperlipidemia readmitted with complaints of fatigue.  She was recently admitted for complaints of chest pressure and a catherization was performed with findings of a patent stents.  She was discharged only to represent in the ER hours later with complaints of fatigue.  The patient reports that both of her legs gave out, one after the other.  No reports of bleeding from the groin region.  Her blood work was obtained and she was noted to have a progressive anemia from 13 down to 9.6 g/dL.  Per her report, she had blood work as an outpatient with findings of a decline in her HGB.  Work up in the ER identified brown stool that was heme positive, but she reports having intermittent melena.  Additionally, she has a history of dysfunctional uterine bleeding.  In the distant past she recalls a colonoscopy and a polyp was identified, but she cannot remember any further details.  She remains on Brilinta.  Past Medical History  Diagnosis Date  . Hyperlipidemia with target LDL less than 70   . STEMI (ST elevation myocardial infarction) ([place]) 06/16/2015    BMS x 2 CFX  . Diabetes mellitus without complication ([place])   . Hypertension   . CHF (congestive heart failure) ([place])   . CAD (coronary artery disease)     a. 06/2015 BMS x2 to LCx. b. recurrent CP, cath 09/18/2015 patent stent, tiny diag stenosis medical therapy.     Past Surgical History  Procedure Laterality Date  . Appendectomy    . Cesarean section    . Cardiac catheterization N/A 06/17/2015    Procedure: Left Heart Cath and Coronary Angiography;  Surgeon: Jettie Booze, MD;  Location: [facility];  Service: Cardiovascular;  Laterality: N/A;  . Cardiac catheterization  06/17/2015    Procedure: Coronary Stent  Intervention;  Surgeon: Jettie Booze, MD;  Location: [facility];  Service: Cardiovascular;;    Family History  Problem Relation Age of Onset  . Stroke Mother   . Cancer Father   . Hypertension Sister   . Hyperlipidemia Sister   . Heart attack Brother   . Hypertension Brother   . Heart attack Maternal Uncle   . Asthma Paternal Uncle   . Thyroid disease Neg Hx     Social History:  reports that she has never smoked. She has never used smokeless tobacco. She reports that she does not drink alcohol or use illicit drugs.  Allergies: No Known Allergies  Medications:  Scheduled: . aspirin EC  81 mg Oral Daily  . atorvastatin  80 mg Oral q1800  . cefTRIAXone (ROCEPHIN)  IV  1 g Intravenous Q24H  . insulin aspart  0-5 Units Subcutaneous QHS  . insulin aspart  0-9 Units Subcutaneous TID WC  . isosorbide mononitrate  15 mg Oral Daily  . lisinopril  2.5 mg Oral Daily  . metoprolol tartrate  25 mg Oral BID  . metroNIDAZOLE  500 mg Oral BID  . sodium chloride  3 mL Intravenous Q12H  . ticagrelor  90 mg Oral BID   Continuous: . sodium chloride 75 mL/hr at 09/19/15 1044    Results for orders placed or performed during the hospital encounter of 09/19/15 (from the past 24 hour(s))  CBG monitoring, ED  Status: None   Collection Time: 09/19/15  5:33 AM  Result Value Ref Range   Glucose-Capillary 98 65 - 99 mg/dL  Basic metabolic panel     Status: Abnormal   Collection Time: 09/19/15  5:47 AM  Result Value Ref Range   Sodium 138 135 - 145 mmol/L   Potassium 3.9 3.5 - 5.1 mmol/L   Chloride 109 101 - 111 mmol/L   CO2 23 22 - 32 mmol/L   Glucose, Bld 115 (H) 65 - 99 mg/dL   BUN 18 6 - 20 mg/dL   Creatinine, Ser 1.05 (H) 0.44 - 1.00 mg/dL   Calcium 8.5 (L) 8.9 - 10.3 mg/dL   GFR calc non Af Amer 59 (L) >60 mL/min   GFR calc Af Amer >60 >60 mL/min   Anion gap 6 5 - 15  CBC     Status: Abnormal   Collection Time: 09/19/15  5:47 AM  Result Value Ref Range   WBC 6.1  4.0 - 10.5 K/uL   RBC 3.33 (L) 3.87 - 5.11 MIL/uL   Hemoglobin 9.6 (L) 12.0 - 15.0 g/dL   HCT 29.8 (L) 36.0 - 46.0 %   MCV 89.5 78.0 - 100.0 fL   MCH 28.8 26.0 - 34.0 pg   MCHC 32.2 30.0 - 36.0 g/dL   RDW 14.5 11.5 - 15.5 %   Platelets 205 150 - 400 K/uL  Protime-INR - (order if Patient is taking Coumadin / Warfarin)     Status: None   Collection Time: 09/19/15  5:47 AM  Result Value Ref Range   Prothrombin Time 14.8 11.6 - 15.2 seconds   INR 1.15 0.00 - 1.49  Troponin I     Status: None   Collection Time: 09/19/15  5:47 AM  Result Value Ref Range   Troponin I <0.03 <0.031 ng/mL  Type and screen [place]     Status: None   Collection Time: 09/19/15  5:47 AM  Result Value Ref Range   ABO/RH(D) O POS    Antibody Screen NEG    Sample Expiration 09/22/2015   ABO/Rh     Status: None   Collection Time: 09/19/15  5:47 AM  Result Value Ref Range   ABO/RH(D) O POS   I-stat troponin, ED (not at [facility])     Status: None   Collection Time: 09/19/15  5:54 AM  Result Value Ref Range   Troponin i, poc 0.01 0.00 - 0.08 ng/mL   Comment 3          POC occult blood, ED Provider will collect     Status: Abnormal   Collection Time: 09/19/15  5:54 AM  Result Value Ref Range   Fecal Occult Bld POSITIVE (A) NEGATIVE     No results found.  ROS:  As stated above in the HPI otherwise negative.  Blood pressure 120/70, pulse 72, temperature 98.7 F (37.1 C), temperature source Oral, resp. rate 16, height 5\' 6"  (1.676 m), weight 78.518 kg (173 lb 1.6 oz), SpO2 100 %.    PE: Gen: NAD, Alert and Oriented HEENT:  [place]/AT, EOMI Neck: Supple, no LAD Lungs: CTA Bilaterally CV: RRR without M/G/R ABM: Soft, NTND, +BS Ext: No C/C/E  Assessment/Plan: 1) Progressive anemia. 2) Heme positive stool. 3) CAD s/p stenting.   I will perform further work up tomorrow AM.  She has to remain on Brilinta and this will help to identify any overt bleeding source.  Plan: 1)  EGD/Colonoscopy.  Makenleigh Crownover D  09/19/2015, 11:35 AM

## 2015-09-20 NOTE — Op Note (Signed)
[facility] [place], [address]   COLONOSCOPY PROCEDURE REPORT  PATIENT: Virginia [name], Virginia [name]  MR#: [id number] BIRTHDATE: 12/18/1960 , 13  yrs. old GENDER: female ENDOSCOPIST: Carol Ada, MD REFERRED BY: PROCEDURE DATE:  2015-10-11 PROCEDURE:   Colonoscopy, diagnostic ASA CLASS:   Class III INDICATIONS: GI bleed MEDICATIONS: See the EGD report.  DESCRIPTION OF PROCEDURE:   After the risks and benefits and of the procedure were explained, informed consent was obtained.  revealed no abnormalities of the rectum.    The Pentax Ped Colon [id number] endoscope was introduced through the anus and advanced to the cecum, which was identified by both the appendix and ileocecal valve .  The quality of the prep was good. .  The instrument was then slowly withdrawn as the colon was fully examined. Estimated blood loss is zero unless otherwise noted in this procedure report.   COLON FINDINGS: A normal appearing cecum, ileocecal valve, and appendiceal orifice were identified.  The ascending, transverse, descending, sigmoid colon, and rectum appeared unremarkable. Retroflexion was not performed due to a narrow rectal vault. The scope was then withdrawn from the patient and the procedure completed.  WITHDRAWAL TIME: 13 minutes  COMPLICATIONS: There were no immediate complications. ENDOSCOPIC IMPRESSION: 1) Normal colonoscopy.  RECOMMENDATIONS: 1) Repeat the colonoscopy in 10 years.  REPEAT EXAM:  cc:  _______________________________ eSignedCarol Ada, MD 2015/10/11 9:58 AM   CPT CODES: ICD CODES:  The ICD and CPT codes recommended by this software are interpretations from the data that the clinical staff has captured with the software.  The verification of the translation of this report to the ICD and CPT codes and modifiers is the sole responsibility of the health care institution and practicing physician where this report was  generated.  [employer] will not be held responsible for the validity of the ICD and CPT codes included on this report.  AMA assumes no liability for data contained or not contained herein. CPT is a Designer, television/film set of the [employer].

## 2015-09-21 ENCOUNTER — Other Ambulatory Visit: Payer: No Typology Code available for payment source

## 2015-09-21 ENCOUNTER — Encounter (HOSPITAL_COMMUNITY): Payer: Self-pay | Admitting: Cardiovascular Disease

## 2015-09-21 DIAGNOSIS — D649 Anemia, unspecified: Secondary | ICD-10-CM

## 2015-09-21 DIAGNOSIS — K922 Gastrointestinal hemorrhage, unspecified: Secondary | ICD-10-CM | POA: Insufficient documentation

## 2015-09-21 LAB — GLUCOSE, CAPILLARY
GLUCOSE-CAPILLARY: 81 mg/dL (ref 65–99)
GLUCOSE-CAPILLARY: 97 mg/dL (ref 65–99)
GLUCOSE-CAPILLARY: 104 mg/dL — AB (ref 65–99)
Glucose-Capillary: 83 mg/dL (ref 65–99)

## 2015-09-21 LAB — HEMOGLOBIN AND HEMATOCRIT, BLOOD
HCT: 28.4 % — ABNORMAL LOW (ref 36.0–46.0)
HCT: 27.3 % — ABNORMAL LOW (ref 36.0–46.0)
HEMATOCRIT: 27.2 % — AB (ref 36.0–46.0)
HEMATOCRIT: 30 % — AB (ref 36.0–46.0)
HEMOGLOBIN: 8.6 g/dL — AB (ref 12.0–15.0)
Hemoglobin: 9.6 g/dL — ABNORMAL LOW (ref 12.0–15.0)
Hemoglobin: 9 g/dL — ABNORMAL LOW (ref 12.0–15.0)
Hemoglobin: 8.7 g/dL — ABNORMAL LOW (ref 12.0–15.0)

## 2015-09-21 LAB — HEMOGLOBIN A1C
Hgb A1c MFr Bld: 6.4 % — ABNORMAL HIGH (ref 4.8–5.6)
Mean Plasma Glucose: 137 mg/dL

## 2015-09-21 MED ORDER — MEGESTROL ACETATE 40 MG PO TABS
40.0000 mg | ORAL_TABLET | Freq: Every day | ORAL | Status: DC
  Filled 2015-09-21: qty 1

## 2015-09-21 MED ORDER — MEGESTROL ACETATE 40 MG PO TABS
80.0000 mg | ORAL_TABLET | Freq: Two times a day (BID) | ORAL | Status: DC
  Administered 2015-09-21: 80 mg via ORAL
  Filled 2015-09-21 (×3): qty 2

## 2015-09-21 MED FILL — Heparin Sodium (Porcine) 2 Unit/ML in Sodium Chloride 0.9%: INTRAMUSCULAR | Qty: 1000 | Status: AC

## 2015-09-21 MED FILL — Nitroglycerin IV Soln 100 MCG/ML in D5W: INTRA_ARTERIAL | Qty: 10 | Status: AC

## 2015-09-21 MED FILL — Heparin Sodium (Porcine) Inj 1000 Unit/ML: INTRAMUSCULAR | Qty: 10 | Status: AC

## 2015-09-21 NOTE — Progress Notes (Signed)
Daily Rounding Note  09/21/2015, 4:14 PM  LOS: 2 days   SUBJECTIVE:       Staff on the floor is reporting that the patient is passing bloody stool.  At 1 AM it was dark, (more melenic?). 2nd and 3rd stools were small but brown soft and bloody. Her hemoglobin yesterday was 8.7. At 1 AM today it was 8.6. At 6:30 this morning it was 9.6. At 3 PM today it was 9.  The volume of blood is reported as being large. She just underwent colonoscopy and upper endoscopy by Dr. Benson Norway to evaluate anemia and her reports of having intermittent dark stools. On EGD there was a large pedunculated polyp which was oozing blood and was Endo clipped. The colonoscopy was normal  OBJECTIVE:         Vital signs in last 24 hours:    Temp:  [98.7 F (37.1 C)-99.1 F (37.3 C)] 98.8 F (37.1 C) (11/14 1345) Pulse Rate:  [81-87] 84 (11/14 1345) Resp:  [18] 18 (11/14 1345) BP: (107-126)/(58-83) 122/67 mmHg (11/14 1345) SpO2:  [99 %-100 %] 100 % (11/14 1345) Weight:  [170 lb 9.6 oz (77.384 kg)] 170 lb 9.6 oz (77.384 kg) (11/14 0448) Last BM Date: 09/21/15 Filed Weights   09/19/15 0835 09/20/15 0432 09/21/15 0448  Weight: 173 lb 1.6 oz (78.518 kg) 171 lb 12.8 oz (77.928 kg) 170 lb 9.6 oz (77.384 kg)   General: obese, looks well   Heart: RRR Chest: clear bil Abdomen: soft, obese, NT . Rectal: rusty brown stool, 2 + FOBT +.   Extremities: no CCE Neuro/Psych:  Oriented x 3, calm.  No gross deficits.   Intake/Output from previous day: 11/13 0701 - 11/14 0700 In: 973.8 [P.O.:240; I.V.:733.8] Out: 1800 [Urine:1800]  Intake/Output this shift: Total I/O In: 1000 [P.O.:1000] Out: 350 [Urine:350]  Lab Results:  Recent Labs  09/18/15 1939 09/19/15 0547 09/20/15 0551  09/21/15 0048 09/21/15 0632 09/21/15 1515  WBC 6.1 6.1 6.0  --   --   --   --   HGB 10.4* 9.6* 8.7*  < > 8.6* 9.6* 9.0*  HCT 32.7* 29.8* 28.0*  < > 27.2* 30.0* 28.4*  PLT 194 205 186   --   --   --   --   < > = values in this interval not displayed. BMET  Recent Labs  09/18/15 1939 09/19/15 0547 09/20/15 0551  NA  --  138 139  K  --  3.9 4.0  CL  --  109 110  CO2  --  23 22  GLUCOSE  --  115* 115*  BUN  --  18 7  CREATININE 0.90 1.05* 0.91  CALCIUM  --  8.5* 8.5*   LFT No results for input(s): PROT, ALBUMIN, AST, ALT, ALKPHOS, BILITOT, BILIDIR, IBILI in the last 72 hours. PT/INR  Recent Labs  09/19/15 0547  LABPROT 14.8  INR 1.15   Hepatitis Panel No results for input(s): HEPBSAG, HCVAB, HEPAIGM, HEPBIGM in the last 72 hours.  Studies/Results: No results found.  ASSESMENT:   *  Acute, painless bloody stool.   Suspect bleeding coming from polyp.  Fortunately Hgb not dropping and VS stable. Pt had oozing gastric polyp clipped on 11/13. Had normal  Colonoscopy 11/13.  Both these studies done to eval anemia and self reported intermittent dark stools.  Also hx dysfunctional uterine bleeding.  Hgb not dropping.   *  Previous BMS, MI 06/2015 on Brilinta  since. This has not been on hold.    PLAN   *  Per Dr Silverio Decamp.   *  Made her NPO after midnight in case EGD planned for tomorrow, if unstable overnight could pursue sooner.   CBC in AM.      Azucena Freed  09/21/2015, 4:14 PM Pager: [phone]

## 2015-09-21 NOTE — Consult Note (Signed)
[facility] OB/GYN Attending Consult Note   Consult Date: 09/21/2015  Reason for Consult: Vaginal bleeding Referring Physician: Dr Jonette Eva is an 54 y.o. [phone] female who was admitted for symptomatically anemia. The patient has a history of fibroids, coronary artery disease status STEMI that resulted in stents in August 2016, IBD, type 2 diabetes. The patient has been seen in our clinic for postmenopausal bleeding, which occurred in May and September. She had an ultrasound ordered to evaluate her enlarged uterus, but had not completed this before she was admitted. Since her visit in our office in early November, the patient has been hospitalized twice: The first on November 10 for chest pain, resulting in cardiac catheterization and the second is a current hospitalization. She was admitted on 09/19/2015 due to weakness, fatigue. Her hemoglobin was noted to be 9.6, which was a downward trend from 12.9 in August. She did have heme positive stools, therefore GI was consulted. The patient had a colonoscopy, which was normal, as well as an EGD which revealed a leading gastric polyp, which was hemoclipped. This morning, the patient woke to having a large amount of vaginal bleeding. There appears to be no clear provoking factors, although the patient does not have any bleeding currently. The bleeding occurs in gushes, particularly when the patient standing.  Assessment/Plan: #1 symptomatic anemia  Hemoglobin currently stable at 9.6.  Continue current management by primary team #2 postmenopausal uterine bleeding  Scant old blood in the vault on exam  US shows multiple fibroids, which are the likely culprits to her bleeding.  However, the endometrium is not well vizualized.  Patient was started on Megace 40 mg daily by her primary team after talking with the OB/GYN on call. Will increase this to 80 mg twice a day, which should completely resolve her  vaginal bleeding over the next couple of days.  The patient should see [place] for a follow-up appointment in a couple of weeks.  She will likely need a endometrial biopsy at her follow-up appointment #3 upper GI bleeding  Status post EGD and hemoclips by GI. #4 coronary artery disease S/p stenting  Continue aspirin, antiplatelet, anti-lipid therapy   Thank you for allowing [place] to consult on this patient.  Please call [phone] [facility] Attending on call) for any gynecologic concerns at any time.  Thank you for involving [place] in the care of this patient.     Pertinent OB/GYN History: No LMP recorded. Patient is postmenopausal.  Patient Active Problem List   Diagnosis Date Noted  . Symptomatic anemia 09/19/2015  . Vasovagal near syncope 09/19/2015  . Dysfunctional uterine bleeding 09/19/2015  . Diarrhea due to drug 09/19/2015  . Heme positive stool 09/19/2015  . Chest pain 09/17/2015  . Multinodular goiter 07/09/2015  . CAD S/P CFX BMS (? med complinace) x 2 06/16/15 07/01/2015  . Acute renal insufficiency 07/01/2015  . Abdominal pain 07/01/2015  . Nodular goiter 06/23/2015  . Diabetes mellitus type 2, controlled ([place]) 06/23/2015  . STEMI -06/16/15 06/17/2015  . Hyperlipidemia with target LDL less than 70     Past Medical History  Diagnosis Date  . Hyperlipidemia with target LDL less than 70   . STEMI (ST elevation myocardial infarction) ([place]) 06/16/2015    BMS x 2 CFX  . Diabetes mellitus without complication ([place])   . Hypertension   . CHF (congestive heart failure) ([place])   . CAD (  coronary artery disease)     a. 06/2015 BMS x2 to LCx. b. recurrent CP, cath 09/18/2015 patent stent, tiny diag stenosis medical therapy.     Past Surgical History  Procedure Laterality Date  . Appendectomy    . Cesarean section    . Cardiac catheterization N/A 06/17/2015    Procedure: Left Heart Cath and Coronary Angiography;  Surgeon: Jettie Booze, MD;  Location: [place] [facility];   Service: Cardiovascular;  Laterality: N/A;  . Cardiac catheterization  06/17/2015    Procedure: Coronary Stent Intervention;  Surgeon: Jettie Booze, MD;  Location: [place] [facility];  Service: Cardiovascular;;  . Cardiac catheterization N/A 09/18/2015    Procedure: Left Heart Cath and Coronary Angiography;  Surgeon: Troy Sine, MD;  Location: [place] [facility];  Service: Cardiovascular;  Laterality: N/A;  . Esophagogastroduodenoscopy N/A 09/20/2015    Procedure: ESOPHAGOGASTRODUODENOSCOPY (EGD);  Surgeon: Carol Ada, MD;  Location: [facility] ENDOSCOPY;  Service: Endoscopy;  Laterality: N/A;  . Colonoscopy N/A 09/20/2015    Procedure: COLONOSCOPY;  Surgeon: Carol Ada, MD;  Location: [place];  Service: Endoscopy;  Laterality: N/A;    Family History  Problem Relation Age of Onset  . Stroke Mother   . Cancer Father   . Hypertension Sister   . Hyperlipidemia Sister   . Heart attack Brother   . Hypertension Brother   . Heart attack Maternal Uncle   . Asthma Paternal Uncle   . Thyroid disease Neg Hx     Social History:  reports that she has never smoked. She has never used smokeless tobacco. She reports that she does not drink alcohol or use illicit drugs.  Allergies: No Known Allergies  Medications: I have reviewed the patient's current medications.  Review of Systems: Pertinent items are noted in HPI.  Focused Physical Examination BP 126/83 mmHg  Pulse 82  Temp(Src) 98.7 F (37.1 C) (Oral)  Resp 16  Ht _0  (1.676 m)  Wt 170 lb 9.6 oz (77.384 kg)  BMI 27.55 kg/m2  SpO2 100% GENERAL: Well-developed, well-nourished female in no acute distress.  HEART: regular rate, no murmur LUNGS: clear to auscultation bilaterally, no wheezing. ABDOMEN: Soft, nontender, nondistended. No organomegaly. PELVIC: Normal external female genitalia. Vagina is pink and rugated.  Normal discharge. Normal cervix contour. Scant old blood in vaginal vault. Uterus is 12 week size. No  adnexal mass or tenderness.  EXTREMITIES: No cyanosis, clubbing, or edema, 2+ distal pulses.  Results for orders placed or performed during the hospital encounter of 09/19/15 (from the past 72 hour(s))  CBG monitoring, ED     Status: None   Collection Time: 09/19/15  5:33 AM  Result Value Ref Range   Glucose-Capillary 98 65 - 99 mg/dL  Basic metabolic panel     Status: Abnormal   Collection Time: 09/19/15  5:47 AM  Result Value Ref Range   Sodium 138 135 - 145 mmol/L   Potassium 3.9 3.5 - 5.1 mmol/L   Chloride 109 101 - 111 mmol/L   CO2 23 22 - 32 mmol/L   Glucose, Bld 115 (H) 65 - 99 mg/dL   BUN 18 6 - 20 mg/dL   Creatinine, Ser 1.05 (H) 0.44 - 1.00 mg/dL   Calcium 8.5 (L) 8.9 - 10.3 mg/dL   GFR calc non Af Amer 59 (L) >60 mL/min   GFR calc Af Amer >60 >60 mL/min    Comment: (NOTE) The eGFR has been calculated using the CKD EPI equation. This calculation  has not been validated in all clinical situations. eGFR's persistently <60 mL/min signify possible Chronic Kidney Disease.    Anion gap 6 5 - 15  CBC     Status: Abnormal   Collection Time: 09/19/15  5:47 AM  Result Value Ref Range   WBC 6.1 4.0 - 10.5 K/uL   RBC 3.33 (L) 3.87 - 5.11 MIL/uL   Hemoglobin 9.6 (L) 12.0 - 15.0 g/dL   HCT 29.8 (L) 36.0 - 46.0 %   MCV 89.5 78.0 - 100.0 fL   MCH 28.8 26.0 - 34.0 pg   MCHC 32.2 30.0 - 36.0 g/dL   RDW 14.5 11.5 - 15.5 %   Platelets 205 150 - 400 K/uL  Protime-INR - (order if Patient is taking Coumadin / Warfarin)     Status: None   Collection Time: 09/19/15  5:47 AM  Result Value Ref Range   Prothrombin Time 14.8 11.6 - 15.2 seconds   INR 1.15 0.00 - 1.49  Troponin I     Status: None   Collection Time: 09/19/15  5:47 AM  Result Value Ref Range   Troponin I <0.03 <0.031 ng/mL    Comment:        NO INDICATION OF MYOCARDIAL INJURY.   Type and screen [place]     Status: None   Collection Time: 09/19/15  5:47 AM  Result Value Ref Range   ABO/RH(D) O POS     Antibody Screen NEG    Sample Expiration 09/22/2015   ABO/Rh     Status: None   Collection Time: 09/19/15  5:47 AM  Result Value Ref Range   ABO/RH(D) O POS   I-stat troponin, ED (not at [facility])     Status: None   Collection Time: 09/19/15  5:54 AM  Result Value Ref Range   Troponin i, poc 0.01 0.00 - 0.08 ng/mL   Comment 3            Comment: Due to the release kinetics of cTnI, a negative result within the first hours of the onset of symptoms does not rule out myocardial infarction with certainty. If myocardial infarction is still suspected, repeat the test at appropriate intervals.   POC occult blood, ED Provider will collect     Status: Abnormal   Collection Time: 09/19/15  5:54 AM  Result Value Ref Range   Fecal Occult Bld POSITIVE (A) NEGATIVE  Hemoglobin A1c     Status: Abnormal   Collection Time: 09/19/15  9:00 AM  Result Value Ref Range   Hgb A1c MFr Bld 6.4 (H) 4.8 - 5.6 %    Comment: (NOTE)         Pre-diabetes: 5.7 - 6.4         Diabetes: >6.4         Glycemic control for adults with diabetes: <7.0    Mean Plasma Glucose 137 mg/dL    Comment: (NOTE) Performed At: [facility] [address] [place], [place] [id number] Lindon Romp MD NK:[id number]   Urinalysis, Routine w reflex microscopic (not at [facility])     Status: Abnormal   Collection Time: 09/19/15 11:34 AM  Result Value Ref Range   Color, Urine YELLOW YELLOW   APPearance CLEAR CLEAR   Specific Gravity, Urine 1.020 1.005 - 1.030   pH 6.0 5.0 - 8.0   Glucose, UA NEGATIVE NEGATIVE mg/dL   Hgb urine dipstick SMALL (A) NEGATIVE   Bilirubin Urine NEGATIVE NEGATIVE   Ketones, ur  NEGATIVE NEGATIVE mg/dL   Protein, ur NEGATIVE NEGATIVE mg/dL   Urobilinogen, UA 1.0 0.0 - 1.0 mg/dL   Nitrite NEGATIVE NEGATIVE   Leukocytes, UA NEGATIVE NEGATIVE  Urine microscopic-add on     Status: Abnormal   Collection Time: 09/19/15 11:34 AM  Result Value Ref Range   Squamous Epithelial / LPF FEW (A)  RARE   WBC, UA 0-2 <3 WBC/hpf   RBC / HPF 0-2 <3 RBC/hpf   Bacteria, UA RARE RARE  Glucose, capillary     Status: Abnormal   Collection Time: 09/19/15 11:43 AM  Result Value Ref Range   Glucose-Capillary 100 (H) 65 - 99 mg/dL  Vitamin B12     Status: None   Collection Time: 09/19/15 12:44 PM  Result Value Ref Range   Vitamin B-12 221 180 - 914 pg/mL    Comment: (NOTE) This assay is not validated for testing neonatal or myeloproliferative syndrome specimens for Vitamin B12 levels.   Folate     Status: None   Collection Time: 09/19/15 12:44 PM  Result Value Ref Range   Folate 8.2 >5.9 ng/mL  Iron and TIBC     Status: Abnormal   Collection Time: 09/19/15 12:44 PM  Result Value Ref Range   Iron 44 28 - 170 ug/dL   TIBC 228 (L) 250 - 450 ug/dL   Saturation Ratios 19 10.4 - 31.8 %   UIBC 184 ug/dL  Ferritin     Status: None   Collection Time: 09/19/15 12:44 PM  Result Value Ref Range   Ferritin 70 11 - 307 ng/mL  Reticulocytes     Status: Abnormal   Collection Time: 09/19/15 12:44 PM  Result Value Ref Range   Retic Ct Pct 2.1 0.4 - 3.1 %   RBC. 3.10 (L) 3.87 - 5.11 MIL/uL   Retic Count, Manual 65.1 19.0 - 186.0 K/uL  Glucose, capillary     Status: None   Collection Time: 09/19/15  4:42 PM  Result Value Ref Range   Glucose-Capillary 90 65 - 99 mg/dL  Glucose, capillary     Status: Abnormal   Collection Time: 09/19/15  8:47 PM  Result Value Ref Range   Glucose-Capillary 124 (H) 65 - 99 mg/dL  Basic metabolic panel     Status: Abnormal   Collection Time: 09/20/15  5:51 AM  Result Value Ref Range   Sodium 139 135 - 145 mmol/L   Potassium 4.0 3.5 - 5.1 mmol/L   Chloride 110 101 - 111 mmol/L   CO2 22 22 - 32 mmol/L   Glucose, Bld 115 (H) 65 - 99 mg/dL   BUN 7 6 - 20 mg/dL   Creatinine, Ser 0.91 0.44 - 1.00 mg/dL   Calcium 8.5 (L) 8.9 - 10.3 mg/dL   GFR calc non Af Amer >60 >60 mL/min   GFR calc Af Amer >60 >60 mL/min    Comment: (NOTE) The eGFR has been calculated using  the CKD EPI equation. This calculation has not been validated in all clinical situations. eGFR's persistently <60 mL/min signify possible Chronic Kidney Disease.    Anion gap 7 5 - 15  CBC     Status: Abnormal   Collection Time: 09/20/15  5:51 AM  Result Value Ref Range   WBC 6.0 4.0 - 10.5 K/uL   RBC 3.04 (L) 3.87 - 5.11 MIL/uL   Hemoglobin 8.7 (L) 12.0 - 15.0 g/dL   HCT 28.0 (L) 36.0 - 46.0 %   MCV 92.1 78.0 - 100.0 fL  MCH 28.6 26.0 - 34.0 pg   MCHC 31.1 30.0 - 36.0 g/dL   RDW 14.9 11.5 - 15.5 %   Platelets 186 150 - 400 K/uL  Glucose, capillary     Status: None   Collection Time: 09/20/15  7:39 AM  Result Value Ref Range   Glucose-Capillary 89 65 - 99 mg/dL  Glucose, capillary     Status: None   Collection Time: 09/20/15 11:44 AM  Result Value Ref Range   Glucose-Capillary 79 65 - 99 mg/dL  Hemoglobin and hematocrit, blood     Status: Abnormal   Collection Time: 09/20/15  1:15 PM  Result Value Ref Range   Hemoglobin 8.9 (L) 12.0 - 15.0 g/dL   HCT 27.7 (L) 36.0 - 46.0 %  Glucose, capillary     Status: None   Collection Time: 09/20/15  4:49 PM  Result Value Ref Range   Glucose-Capillary 73 65 - 99 mg/dL  Hemoglobin and hematocrit, blood     Status: Abnormal   Collection Time: 09/20/15  6:28 PM  Result Value Ref Range   Hemoglobin 8.7 (L) 12.0 - 15.0 g/dL   HCT 27.6 (L) 36.0 - 46.0 %  Glucose, capillary     Status: Abnormal   Collection Time: 09/20/15  8:27 PM  Result Value Ref Range   Glucose-Capillary 104 (H) 65 - 99 mg/dL  Hemoglobin and hematocrit, blood     Status: Abnormal   Collection Time: 09/21/15 12:48 AM  Result Value Ref Range   Hemoglobin 8.6 (L) 12.0 - 15.0 g/dL   HCT 27.2 (L) 36.0 - 46.0 %  Hemoglobin and hematocrit, blood     Status: Abnormal   Collection Time: 09/21/15  6:32 AM  Result Value Ref Range   Hemoglobin 9.6 (L) 12.0 - 15.0 g/dL   HCT 30.0 (L) 36.0 - 46.0 %  Glucose, capillary     Status: None   Collection Time: 09/21/15  7:41 AM    Result Value Ref Range   Glucose-Capillary 81 65 - 99 mg/dL   Comment 1 Notify RN   Glucose, capillary     Status: None   Collection Time: 09/21/15 11:18 AM  Result Value Ref Range   Glucose-Capillary 83 65 - 99 mg/dL   Comment 1 Notify RN      US Transvaginal Non-ob  09/19/2015  CLINICAL DATA:  Dysfunctional uterine bleeding. EXAM: TRANSABDOMINAL AND TRANSVAGINAL ULTRASOUND OF PELVIS TECHNIQUE: Both transabdominal and transvaginal ultrasound examinations of the pelvis were performed. Transabdominal technique was performed for global imaging of the pelvis including uterus, ovaries, adnexal regions, and pelvic cul-de-sac. It was necessary to proceed with endovaginal exam following the transabdominal exam to visualize the uterus and right adnexa to better advantage. COMPARISON:  CT, 06/29/2015 FINDINGS: Uterus Measurements: 14.2 x 7.5 x 6.4 cm. Multiple uterine masses consistent fibroids. Largest arises from the posterior right uterine fundus measuring 3.5 x 2.9 x 2.9 cm. Echogenic material within this is consistent with calcifications, which were present on the prior CT. There are at least 3 additional smaller fibroids. Endometrium Not visualized. Right ovary Not visualized.  No right adnexal mass. Left ovary Not visualized.  No left adnexal mass. Other findings No free fluid. IMPRESSION: 1. Enlarged uterus with multiple fibroids. 2. Endometrium not visualized. Neither ovary visualized. No adnexal masses. Electronically Signed   By: Lajean Manes M.D.   On: 09/19/2015 13:50   US Pelvis Complete  09/19/2015  CLINICAL DATA:  Dysfunctional uterine bleeding. EXAM: TRANSABDOMINAL AND TRANSVAGINAL ULTRASOUND OF  PELVIS TECHNIQUE: Both transabdominal and transvaginal ultrasound examinations of the pelvis were performed. Transabdominal technique was performed for global imaging of the pelvis including uterus, ovaries, adnexal regions, and pelvic cul-de-sac. It was necessary to proceed with endovaginal exam  following the transabdominal exam to visualize the uterus and right adnexa to better advantage. COMPARISON:  CT, 06/29/2015 FINDINGS: Uterus Measurements: 14.2 x 7.5 x 6.4 cm. Multiple uterine masses consistent fibroids. Largest arises from the posterior right uterine fundus measuring 3.5 x 2.9 x 2.9 cm. Echogenic material within this is consistent with calcifications, which were present on the prior CT. There are at least 3 additional smaller fibroids. Endometrium Not visualized. Right ovary Not visualized.  No right adnexal mass. Left ovary Not visualized.  No left adnexal mass. Other findings No free fluid. IMPRESSION: 1. Enlarged uterus with multiple fibroids. 2. Endometrium not visualized. Neither ovary visualized. No adnexal masses. Electronically Signed   By: Lajean Manes M.D.   On: 09/19/2015 13:50     Truett Mainland, DO 09/21/2015, 12:20 PM Attending Physician Faculty Practice, [facility]

## 2015-09-21 NOTE — Progress Notes (Signed)
UR Completed Ronia Hazelett Graves-Bigelow, RN,BSN [phone]  

## 2015-09-21 NOTE — Progress Notes (Signed)
Patient Demographics:    Virginia [name], is a 54 y.o. female, DOB - [birth year], [id number]  Admit date - 09/19/2015   Admitting Physician Waldemar Dickens, MD  Outpatient Primary MD for the patient is [name], MD  LOS - 2   Chief Complaint  Patient presents with  . Fatigue        Subjective:    [name] today has, No headache, No chest pain, No abdominal pain - No Nausea, No new weakness tingling or numbness, No Cough - SOB. Generalized weakness. She is losing bright red blood according to the patient per rectum, also some blood in the vagina on exam.   Assessment  & Plan :     1. Generalized weakness due to symptomatic UGI bleeding related anemia. Underwent colonoscopy and EGD, colonoscopy unremarkable, EGD shows bleeding polyp which was hemoclipped by GI physician Dr. Benson Norway on 09/20/2015. Currently not requiring transfusion we'll continue to monitor H&H, IV PPI, type screen. Clear liquid diet. Looks like there is question of bright red blood per rectum and also some from vagina. We'll continue to monitor H&H. GI to evaluate. GYN called as well.   2. Dysfunctional uterine bleeding. Uterine and vaginal ultrasound unremarkable, she was due for uterine biopsy soon, there is now question of ongoing vaginal bleeding, have requested GYN physician on-call to evaluate her today, she recommended Megace for now which has been ordered. Will monitor H&H.   3. CAD status post metal stent placement x 2 on 06/16/2015 - no acute issues, chest pain-free, for now we have to continue aspirin and [place] to. Continue beta blocker, ACE inhibitor, nitrates and statin. Case was discussed upon admission either admitting team with Dr. Golden Hurter.   4. Mild acute renal failure due to anemia resolved with  hydration.   5. Dyslipidemia. On statin continue.   6. Chr. intermittent diarrhea. Likely due to metformin which has been stopped. Hold antibiotics and monitor.    7.DM type II. On sliding-scale continue.  Lab Results  Component Value Date   HGBA1C 6.4* 09/19/2015    CBG (last 3)   Recent Labs  09/20/15 1649 09/20/15 2027 09/21/15 0741  GLUCAP 73 104* 81     Code Status : Full  Family Communication  : none  Disposition Plan  : Home 1-2 days  Consults  :  GI-Hung  Procedures  :   Colonoscopy unremarkable  EGD  1) Bleeding penduculated fundic polyp s/p hemoclipping.  RECOMMENDATIONS: 1) Follow HGB. 2) Repeating the EGD in the near future to reevaluate the status of the polyp. _______________________________ eSigned: Carol Ada, MD 09/20/2015 9:55 AM  DVT Prophylaxis  :   SCDs   Lab Results  Component Value Date   PLT 186 09/20/2015    Inpatient Medications  Scheduled Meds: . aspirin EC  81 mg Oral Daily  . atorvastatin  80 mg Oral q1800  . insulin aspart  0-5 Units Subcutaneous QHS  . insulin aspart  0-9 Units Subcutaneous TID WC  . isosorbide mononitrate  15 mg Oral Daily  . lisinopril  2.5 mg Oral Daily  . megestrol  40 mg Oral Daily  . metoprolol tartrate  25 mg Oral BID  . pantoprazole (PROTONIX) IV  40 mg Intravenous  Q12H  . sodium chloride  3 mL Intravenous Q12H  . ticagrelor  90 mg Oral BID   Continuous Infusions:   PRN Meds:.acetaminophen **OR** acetaminophen, ondansetron **OR** ondansetron (ZOFRAN) IV, oxyCODONE  Antibiotics  :     Anti-infectives    Start     Dose/Rate Route Frequency Ordered Stop   09/19/15 1000  metroNIDAZOLE (FLAGYL) tablet 500 mg  Status:  Discontinued     500 mg Oral 2 times daily 09/19/15 0942 09/20/15 1039   09/19/15 0945  cefTRIAXone (ROCEPHIN) 1 g in dextrose 5 % 50 mL IVPB  Status:  Discontinued     1 g 100 mL/hr over 30 Minutes Intravenous Every 24 hours 09/19/15 0942 09/20/15 1039         Objective:   Filed Vitals:   09/20/15 1344 09/20/15 2027 09/21/15 0448 09/21/15 1011  BP: 130/63 109/62 107/58 126/83  Pulse: 92 81 87 82  Temp: 98.9 F (37.2 C) 99.1 F (37.3 C) 98.7 F (37.1 C)   TempSrc: Oral Oral Oral   Resp: 16     Height:      Weight:   77.384 kg (170 lb 9.6 oz)   SpO2: 100% 99% 100%     Wt Readings from Last 3 Encounters:  09/21/15 77.384 kg (170 lb 9.6 oz)  09/18/15 77.747 kg (171 lb 6.4 oz)  09/10/15 78.835 kg (173 lb 12.8 oz)     Intake/Output Summary (Last 24 hours) at 09/21/15 1202 Last data filed at 09/21/15 1000  Gross per 24 hour  Intake 1343.75 ml  Output   2150 ml  Net -806.25 ml     Physical Exam  Awake Alert, Oriented X 3, No new F.N deficits, Normal affect [place].AT,PERRAL Supple Neck,No JVD, No cervical lymphadenopathy appriciated.  Symmetrical Chest wall movement, Good air movement bilaterally, CTAB RRR,No Gallops,Rubs or new Murmurs, No Parasternal Heave +ve B.Sounds, Abd Soft, No tenderness, No organomegaly appriciated, No rebound - guarding or rigidity. No Cyanosis, Clubbing or edema, No new Rash or bruise       Data Review:   Micro Results No results found for this or any previous visit (from the past 240 hour(s)).  Radiology Reports Dg Chest 2 View  09/17/2015  CLINICAL DATA:  Shortness of breath and chest tightness for 1 day EXAM: CHEST  2 VIEW COMPARISON:  December 20, 2011 FINDINGS: There is no edema or consolidation. The heart size and pulmonary vascularity are normal. No adenopathy. No pneumothorax. No bone lesions. IMPRESSION: No edema or consolidation. Electronically Signed   By: Lowella Grip III M.D.   On: 09/17/2015 11:12   US Transvaginal Non-ob  09/19/2015  CLINICAL DATA:  Dysfunctional uterine bleeding. EXAM: TRANSABDOMINAL AND TRANSVAGINAL ULTRASOUND OF PELVIS TECHNIQUE: Both transabdominal and transvaginal ultrasound examinations of the pelvis were performed. Transabdominal technique was performed for  global imaging of the pelvis including uterus, ovaries, adnexal regions, and pelvic cul-de-sac. It was necessary to proceed with endovaginal exam following the transabdominal exam to visualize the uterus and right adnexa to better advantage. COMPARISON:  CT, 06/29/2015 FINDINGS: Uterus Measurements: 14.2 x 7.5 x 6.4 cm. Multiple uterine masses consistent fibroids. Largest arises from the posterior right uterine fundus measuring 3.5 x 2.9 x 2.9 cm. Echogenic material within this is consistent with calcifications, which were present on the prior CT. There are at least 3 additional smaller fibroids. Endometrium Not visualized. Right ovary Not visualized.  No right adnexal mass. Left ovary Not visualized.  No left adnexal mass. Other findings  No free fluid. IMPRESSION: 1. Enlarged uterus with multiple fibroids. 2. Endometrium not visualized. Neither ovary visualized. No adnexal masses. Electronically Signed   By: Lajean Manes M.D.   On: 09/19/2015 13:50   US Pelvis Complete  09/19/2015  CLINICAL DATA:  Dysfunctional uterine bleeding. EXAM: TRANSABDOMINAL AND TRANSVAGINAL ULTRASOUND OF PELVIS TECHNIQUE: Both transabdominal and transvaginal ultrasound examinations of the pelvis were performed. Transabdominal technique was performed for global imaging of the pelvis including uterus, ovaries, adnexal regions, and pelvic cul-de-sac. It was necessary to proceed with endovaginal exam following the transabdominal exam to visualize the uterus and right adnexa to better advantage. COMPARISON:  CT, 06/29/2015 FINDINGS: Uterus Measurements: 14.2 x 7.5 x 6.4 cm. Multiple uterine masses consistent fibroids. Largest arises from the posterior right uterine fundus measuring 3.5 x 2.9 x 2.9 cm. Echogenic material within this is consistent with calcifications, which were present on the prior CT. There are at least 3 additional smaller fibroids. Endometrium Not visualized. Right ovary Not visualized.  No right adnexal mass. Left ovary  Not visualized.  No left adnexal mass. Other findings No free fluid. IMPRESSION: 1. Enlarged uterus with multiple fibroids. 2. Endometrium not visualized. Neither ovary visualized. No adnexal masses. Electronically Signed   By: Lajean Manes M.D.   On: 09/19/2015 13:50     CBC  Recent Labs Lab 09/17/15 1112 09/18/15 1939 09/19/15 0547 09/20/15 0551 09/20/15 1315 09/20/15 1828 09/21/15 0048 09/21/15 0632  WBC 5.5 6.1 6.1 6.0  --   --   --   --   HGB 10.8* 10.4* 9.6* 8.7* 8.9* 8.7* 8.6* 9.6*  HCT 33.8* 32.7* 29.8* 28.0* 27.7* 27.6* 27.2* 30.0*  PLT 221 194 205 186  --   --   --   --   MCV 90.4 90.1 89.5 92.1  --   --   --   --   MCH 28.9 28.7 28.8 28.6  --   --   --   --   MCHC 32.0 31.8 32.2 31.1  --   --   --   --   RDW 14.3 14.4 14.5 14.9  --   --   --   --   LYMPHSABS 2.1  --   --   --   --   --   --   --   MONOABS 0.3  --   --   --   --   --   --   --   EOSABS 0.3  --   --   --   --   --   --   --   BASOSABS 0.0  --   --   --   --   --   --   --     Chemistries   Recent Labs Lab 09/17/15 1112 09/18/15 0408 09/18/15 1939 09/19/15 0547 09/20/15 0551  NA 137 138  --  138 139  K 4.1 3.9  --  3.9 4.0  CL 103 105  --  109 110  CO2 26 26  --  23 22  GLUCOSE 103* 92  --  115* 115*  BUN 6 12  --  18 7  CREATININE 1.06* 0.93 0.90 1.05* 0.91  CALCIUM 9.2 8.9  --  8.5* 8.5*  AST 15  --   --   --   --   ALT 14  --   --   --   --   ALKPHOS 87  --   --   --   --  BILITOT 0.5  --   --   --   --    ------------------------------------------------------------------------------------------------------------------ estimated creatinine clearance is 73.3 mL/min (by C-G formula based on Cr of 0.91). ------------------------------------------------------------------------------------------------------------------  Recent Labs  09/19/15 0900  HGBA1C 6.4*   ------------------------------------------------------------------------------------------------------------------ No  results for input(s): CHOL, HDL, LDLCALC, TRIG, CHOLHDL, LDLDIRECT in the last 72 hours. ------------------------------------------------------------------------------------------------------------------ No results for input(s): TSH, T4TOTAL, T3FREE, THYROIDAB in the last 72 hours.  Invalid input(s): FREET3 ------------------------------------------------------------------------------------------------------------------  Recent Labs  09/19/15 1244  VITAMINB12 221  FOLATE 8.2  FERRITIN 70  TIBC 228*  IRON 44  RETICCTPCT 2.1    Coagulation profile  Recent Labs Lab 09/18/15 1106 09/19/15 0547  INR 1.12 1.15    No results for input(s): DDIMER in the last 72 hours.  Cardiac Enzymes  Recent Labs Lab 09/18/15 0408 09/18/15 1106 09/19/15 0547  TROPONINI <0.03 <0.03 <0.03   ------------------------------------------------------------------------------------------------------------------ Invalid input(s): POCBNP   Time Spent in minutes  35   SINGH,PRASHANT K M.D on 09/21/2015 at 12:02 PM  Between 7am to 7pm - Pager - [phone]  After 7pm go to [email or web address] - password [facility]  [facility] -  Office  [phone]

## 2015-09-21 NOTE — Care Management Note (Signed)
Case Management Note  Patient Details  Name: VICKTORIA UTESCH MRN: [id number] Date of Birth: April 12, 1961  Subjective/Objective: Pt admitted for Anemia. Post EGD/ Colonoscopy per MD notes both unremarkable.                     Action/Plan: Pt has f/[facility]. Pt is without insurance-will be able to utilize the [facility] for medications. No further needs from CM at this time.    Expected Discharge Date:                  Expected Discharge Plan:  Home/Self Care  In-House Referral:  NA  Discharge planning Services  CM Consult, Follow-up appt scheduled, [facility], Medication Assistance  Post Acute Care Choice:  NA Choice offered to:  NA  DME Arranged:  N/A DME Agency:  NA  HH Arranged:  NA HH Agency:  NA  Status of Service:  Completed, signed off  Medicare Important Message Given:    Date Medicare IM Given:    Medicare IM give by:    Date Additional Medicare IM Given:    Additional Medicare Important Message give by:     If discussed at [place] of Stay Meetings, dates discussed:    Additional Comments:  Bethena Roys, RN 09/21/2015, 11:53 AM

## 2015-09-21 NOTE — Evaluation (Signed)
Physical Therapy Evaluation Patient Details Name: AREESHA TONKINSON MRN: [id number] DOB: 11/04/1961 Today's Date: 09/21/2015   History of Present Illness  54 yo female admitted 11/12 Generalized weakness due to symptomatic UGI bleeding related anemia. Underwent colonoscopy and EGD, colonoscopy unremarkable, EGD shows bleeding polyp which was hemoclipped by GI  on 09/20/2015.  Recent Cardiac cath performed 11/11.  H/O CAD, STENT.  Clinical Impression  Patient is independent with mobility. No further PT needs    Follow Up Recommendations No PT follow up    Equipment Recommendations  None recommended by PT    Recommendations for Other Services       Precautions / Restrictions Precautions Precautions: None Restrictions Weight Bearing Restrictions: No      Mobility  Bed Mobility Overal bed mobility: Independent                Transfers Overall transfer level: Independent                  Ambulation/Gait Ambulation/Gait assistance: Independent Ambulation Distance (Feet): 400 Feet Assistive device: None Gait Pattern/deviations: WFL(Within Functional Limits)     General Gait Details: no  balance losses.  Stairs            Wheelchair Mobility    Modified Rankin (Stroke Patients Only)       Balance Overall balance assessment: Independent                                           Pertinent Vitals/Pain Pain Assessment: No/denies pain    Home Living Family/patient expects to be discharged to:: Private residence Living Arrangements: Spouse/significant other Available Help at Discharge: Family Type of Home: House Home Access: Stairs to enter Entrance Stairs-Rails: None Technical brewer of Steps: 4 Home Layout: One level Home Equipment: None      Prior Function Level of Independence: Independent               Hand Dominance        Extremity/Trunk Assessment   Upper Extremity Assessment: Overall WFL for  tasks assessed           Lower Extremity Assessment: Overall WFL for tasks assessed      Cervical / Trunk Assessment: Normal  Communication   Communication: No difficulties  Cognition Arousal/Alertness: Awake/alert Behavior During Therapy: WFL for tasks assessed/performed Overall Cognitive Status: Within Functional Limits for tasks assessed                      General Comments      Exercises        Assessment/Plan    PT Assessment Patent does not need any further PT services  PT Diagnosis     PT Problem List    PT Treatment Interventions     PT Goals (Current goals can be found in the Care Plan section) Acute Rehab PT Goals PT Goal Formulation: All assessment and education complete, DC therapy    Frequency     Barriers to discharge        Co-evaluation               End of Session   Activity Tolerance: Patient tolerated treatment well Patient left: in chair;with call bell/phone within reach Nurse Communication: Mobility status         Time: 0912-0930 PT Time Calculation (min) (ACUTE ONLY): 18 min  Charges:   PT Evaluation $Initial PT Evaluation Tier I: 1 Procedure     PT G CodesClaretha Cooper 09/21/2015, 9:38 AM Tresa Endo PT [phone]

## 2015-09-22 LAB — BASIC METABOLIC PANEL
Anion gap: 4 — ABNORMAL LOW (ref 5–15)
CO2: 25 mmol/L (ref 22–32)
CREATININE: 0.92 mg/dL (ref 0.44–1.00)
Calcium: 8.8 mg/dL — ABNORMAL LOW (ref 8.9–10.3)
Chloride: 110 mmol/L (ref 101–111)
GFR calc Af Amer: >60 mL/min (ref >60)
Glucose, Bld: 128 mg/dL — ABNORMAL HIGH (ref 65–99)
Potassium: 3.6 mmol/L (ref 3.5–5.1)
SODIUM: 139 mmol/L (ref 135–145)

## 2015-09-22 LAB — CBC
HCT: 28.6 % — ABNORMAL LOW (ref 36.0–46.0)
Hemoglobin: 9 g/dL — ABNORMAL LOW (ref 12.0–15.0)
MCH: 28.8 pg (ref 26.0–34.0)
MCHC: 31.5 g/dL (ref 30.0–36.0)
MCV: 91.7 fL (ref 78.0–100.0)
PLATELETS: 195 10*3/uL (ref 150–400)
RBC: 3.12 MIL/uL — ABNORMAL LOW (ref 3.87–5.11)
RDW: 15.1 % (ref 11.5–15.5)
WBC: 6.3 10*3/uL (ref 4.0–10.5)

## 2015-09-22 LAB — GLUCOSE, CAPILLARY: Glucose-Capillary: 88 mg/dL (ref 65–99)

## 2015-09-22 MED ORDER — FERROUS SULFATE 325 (65 FE) MG PO TABS: 325.0000 mg | ORAL_TABLET | Freq: Two times a day (BID) | ORAL | Status: DC

## 2015-09-22 MED ORDER — PANTOPRAZOLE SODIUM 40 MG PO TBEC: 40.0000 mg | DELAYED_RELEASE_TABLET | Freq: Every day | ORAL | Status: DC

## 2015-09-22 MED ORDER — MEGESTROL ACETATE 40 MG PO TABS: 80.0000 mg | ORAL_TABLET | Freq: Two times a day (BID) | ORAL | Status: DC

## 2015-09-22 NOTE — Discharge Instructions (Signed)
Follow with Primary MD JEGEDE, OLUGBEMIGA, MD in 7 days  ° °Get CBC, CMP, 2 view Chest X ray checked  by Primary MD next visit.  ° ° °Activity: As tolerated with Full fall precautions use walker/cane & assistance as needed ° ° °Disposition Home  ° ° °Diet: Heart Healthy   ° °For Heart failure patients - Check your Weight same time everyday, if you gain over 2 pounds, or you develop in leg swelling, experience more shortness of breath or chest pain, call your Primary MD immediately. Follow Cardiac Low Salt Diet and 1.5 lit/day fluid restriction. ° ° °On your next visit with your primary care physician please Get Medicines reviewed and adjusted. ° ° °Please request your Prim.MD to go over all Hospital Tests and Procedure/Radiological results at the follow up, please get all Hospital records sent to your Prim MD by signing hospital release before you go home. ° ° °If you experience worsening of your admission symptoms, develop shortness of breath, life threatening emergency, suicidal or homicidal thoughts you must seek medical attention immediately by calling 911 or calling your MD immediately  if symptoms less severe. ° °You Must read complete instructions/literature along with all the possible adverse reactions/side effects for all the Medicines you take and that have been prescribed to you. Take any new Medicines after you have completely understood and accpet all the possible adverse reactions/side effects.  ° °Do not drive, operating heavy machinery, perform activities at heights, swimming or participation in water activities or provide baby sitting services if your were admitted for syncope or siezures until you have seen by Primary MD or a Neurologist and advised to do so again. ° °Do not drive when taking Pain medications.  ° ° °Do not take more than prescribed Pain, Sleep and Anxiety Medications ° °Special Instructions: If you have smoked or chewed Tobacco  in the last 2 yrs please stop smoking, stop any  regular Alcohol  and or any Recreational drug use. ° °Wear Seat belts while driving. ° ° °Please note ° °You were cared for by a hospitalist during your hospital stay. If you have any questions about your discharge medications or the care you received while you were in the hospital after you are discharged, you can call the unit and asked to speak with the hospitalist on call if the hospitalist that took care of you is not available. Once you are discharged, your primary care physician will handle any further medical issues. Please note that NO REFILLS for any discharge medications will be authorized once you are discharged, as it is imperative that you return to your primary care physician (or establish a relationship with a primary care physician if you do not have one) for your aftercare needs so that they can reassess your need for medications and monitor your lab values. ° °

## 2015-09-22 NOTE — Discharge Summary (Signed)
Virginia [name], is a 54 y.o. female  DOB [birth year]  MRN [id number].  Admission date:  09/19/2015  Admitting Physician  Waldemar Dickens, MD  Discharge Date:  09/22/2015   Primary MD  [name], MD  Recommendations for primary care physician for things to follow:   Monitor H&H closely. Needs close outpatient GI, GYN and cardiology follow-up.   Admission Diagnosis  Acute GI bleeding [K92.2]   Discharge Diagnosis  Acute GI bleeding [K92.2]     Principal Problem:   Symptomatic anemia Active Problems:   Hyperlipidemia with target LDL less than 70   Diabetes mellitus type 2, controlled (HCC)   CAD S/P CFX BMS (? med complinace) x 2 06/16/15   Acute renal insufficiency   Vasovagal near syncope   Dysfunctional uterine bleeding   Diarrhea due to drug   Heme positive stool   Acute GI bleeding      Past Medical History  Diagnosis Date  . Hyperlipidemia with target LDL less than 70   . STEMI (ST elevation myocardial infarction) ([place]) 06/16/2015    BMS x 2 CFX  . Diabetes mellitus without complication ([place])   . Hypertension   . CHF (congestive heart failure) ([place])   . CAD (coronary artery disease)     a. 06/2015 BMS x2 to LCx. b. recurrent CP, cath 09/18/2015 patent stent, tiny diag stenosis medical therapy.     Past Surgical History  Procedure Laterality Date  . Appendectomy    . Cesarean section    . Cardiac catheterization N/A 06/17/2015    Procedure: Left Heart Cath and Coronary Angiography;  Surgeon: Jettie Booze, MD;  Location: [facility];  Service: Cardiovascular;  Laterality: N/A;  . Cardiac catheterization  06/17/2015    Procedure: Coronary Stent Intervention;  Surgeon: Jettie Booze, MD;  Location: [facility];  Service: Cardiovascular;;  . Cardiac catheterization  N/A 09/18/2015    Procedure: Left Heart Cath and Coronary Angiography;  Surgeon: Troy Sine, MD;  Location: [facility];  Service: Cardiovascular;  Laterality: N/A;  . Esophagogastroduodenoscopy N/A 09/20/2015    Procedure: ESOPHAGOGASTRODUODENOSCOPY (EGD);  Surgeon: Carol Ada, MD;  Location: [facility];  Service: Endoscopy;  Laterality: N/A;  . Colonoscopy N/A 09/20/2015    Procedure: COLONOSCOPY;  Surgeon: Carol Ada, MD;  Location: [facility];  Service: Endoscopy;  Laterality: N/A;       HPI  from the history and physical done on the day of admission:   This is a 54 year old female patient with history of CAD status post 2 bare metal stents in August 2016, she also has a history of dyslipidemia and was recently diagnosed with diabetes in August 2016 and started on metformin. Of note in September she experienced significant dysfunctional uterine bleeding and has been evaluated as an outpatient by her GYN. She presented to the ER today after just being discharged on 11/12. During that admission she presented with chest pain and underwent a repeat cardiac catheterization which demonstrated patent stents  with a small stenosis of diagonal branch. Her nitrate dose was increased and no other medications were changed. Patient reported to the EDP that she has been feeling fatigued especially since the most recent admission and she went to the bathroom and had a black bowel movement. After experiencing this black bowel movement she reported she felt very weak and fell against the bed. EMS was called to the home and her blood pressure was 90/40 at that time. She was also reporting dizziness. Upon further questioning of the patient she reports that she has been having watery crampy diarrhea since September 2016. When asked about any new medication she reports she was started on metformin at the end of August and noticed the diarrhea began after adding this medication and she specifically notes  diarrheal episodes immediately following each dosage.  In the ER patient was afebrile, BP was 107/63 with a heart rate of 71 and a spray as of 13, room air saturations were 100%, partial orthostatic vital signs were obtained from lying to seated and these were not positive. Laboratory data unremarkable except for mild renal insufficiency with a creatinine of 1.05 with a previous creatinine of 0.9. Troponin was negative 2 collections, hemoglobin had decreased to 9.6 noting on 11/11 hemoglobin was 10.4 and previous baseline hemoglobin August 2016 was 12.9. EDP performed DRE noting brown stool without any obvious blood at this was positive when tested. EKG was unremarkable and consistent with prior readings. In addition to above issues patient reports recently she's had palpitations prior to last admission and she has previously discussed these symptoms with her cardiologist. She is also been expressing some hot flashes. She is not had any further chest pain.      Hospital Course:     1. Generalized weakness due to symptomatic UGI bleeding related anemia. Underwent colonoscopy and EGD, colonoscopy unremarkable, EGD shows bleeding polyp which was hemoclipped by GI physician Dr. [place] Norway on 09/20/2015.   Did not require transfusion in fact H&H has been gradually rising up, she still has some bright red spotting on her bed which per me and GI physician appears more to be vagina spotting than coming from her rectum. Colonoscopy was unremarkable and this does not seem to be a massive upper GI bleed like picture as her H&H has been rising for over 36 hours with spotting on the bed. Case discussed with GI physician on-call today, will be placed on PPI and iron discharge with one-week follow-up in the GI clinic for possible polypectomy.   2. Dysfunctional uterine bleeding. Uterine and vaginal ultrasound unremarkable, she was due for uterine biopsy soon, there is now question of ongoing vaginal bleeding, have  requested GYN physician on-call to evaluate her today, she was seen by GYN here, placed on Megace, will follow with them again in the outpatient setting.   3. CAD status post metal stent placement x 2 on 06/16/2015 - no acute issues, chest pain-free, for now we have to continue aspirin and Brilinta, discussed her case with her cardiologist Dr. Irish Lack in detail today, since she is more than 1 month out of a bare-metal stent her Brilinta will be stopped for at least a few weeks. Continue beta blocker, ACE inhibitor, nitrates and statin. He will follow with cardiology in couple of weeks thereafter second antiplatelet medication can be resumed if needed.   4. Mild acute renal failure due to anemia resolved with hydration.   5. Dyslipidemia. On statin continue.   6. Chr. intermittent diarrhea. Likely due  to metformin which has been stopped. Hold antibiotics and follow with GI in 1 week.   7.DM type II. Was on Glucophage but has been stopped due to diarrhea, request to follow with PCP in a week for discussing new medications, continue diet control for now. CBG stable here.  CBG (last 3)   Recent Labs  09/21/15 1118 09/21/15 1630 09/21/15 2052  GLUCAP 83 97 104*     Discharge Condition: fair  Follow UP  Follow-up Information    Follow up with [facility] On 09/29/2015.   Why:  Appointment @ 9:30 am with Dr. Doreene Burke. Pt can use the  [facility] for medications.   Contact information:   [place] [phone]       Follow up with Harl Bowie, MD. Schedule an appointment as soon as possible for a visit in 1 week.   Specialty:  Gastroenterology   Contact information:   [place] [id number] [phone]       Follow up with Jettie Booze., MD. Schedule an appointment as soon as possible for a visit in 2 weeks.   Specialties:  Cardiology, Radiology, Interventional Cardiology     Contact information:   [id number] [place] [address] [phone]       Follow up with Nehemiah Settle, [place], DO. Schedule an appointment as soon as possible for a visit in 1 week.   Specialty:  Family Medicine   Why:  Vaginal bleeding   Contact information:   [place] [address] [phone]        Consults obtained - GI, GYN  Diet and Activity recommendation: See Discharge Instructions below  Discharge Instructions       Discharge Instructions    Discharge instructions    Complete by:  As directed   Follow with Primary MD [name], MD in 7 days   Get CBC, CMP, 2 view Chest X ray checked  by Primary MD next visit.    Activity: As tolerated with Full fall precautions use walker/cane & assistance as needed   Disposition Home    Diet: Heart Healthy low carb  For Heart failure patients - Check your Weight same time everyday, if you gain over 2 pounds, or you develop in leg swelling, experience more shortness of breath or chest pain, call your Primary MD immediately. Follow Cardiac Low Salt Diet and 1.5 lit/day fluid restriction.   On your next visit with your primary care physician please Get Medicines reviewed and adjusted.   Please request your Prim.MD to go over all Hospital Tests and Procedure/Radiological results at the follow up, please get all Hospital records sent to your Prim MD by signing hospital release before you go home.   If you experience worsening of your admission symptoms, develop shortness of breath, life threatening emergency, suicidal or homicidal thoughts you must seek medical attention immediately by calling 911 or calling your MD immediately  if symptoms less severe.  You Must read complete instructions/literature along with all the possible adverse reactions/side effects for all the Medicines you take and that have been prescribed to you. Take any new Medicines after you have completely  understood and accpet all the possible adverse reactions/side effects.   Do not drive, operating heavy machinery, perform activities at heights, swimming or participation in water activities or provide baby sitting services if your were admitted for syncope or siezures until  you have seen by Primary MD or a Neurologist and advised to do so again.  Do not drive when taking Pain medications.    Do not take more than prescribed Pain, Sleep and Anxiety Medications  Special Instructions: If you have smoked or chewed Tobacco  in the last 2 yrs please stop smoking, stop any regular Alcohol  and or any Recreational drug use.  Wear Seat belts while driving.   Please note  You were cared for by a hospitalist during your hospital stay. If you have any questions about your discharge medications or the care you received while you were in the hospital after you are discharged, you can call the unit and asked to speak with the hospitalist on call if the hospitalist that took care of you is not available. Once you are discharged, your primary care physician will handle any further medical issues. Please note that NO REFILLS for any discharge medications will be authorized once you are discharged, as it is imperative that you return to your primary care physician (or establish a relationship with a primary care physician if you do not have one) for your aftercare needs so that they can reassess your need for medications and monitor your lab values.     Increase activity slowly    Complete by:  As directed              Discharge Medications       Medication List    STOP taking these medications        metFORMIN 500 MG tablet  Commonly known as:  GLUCOPHAGE     metroNIDAZOLE 500 MG tablet  Commonly known as:  FLAGYL     ticagrelor 90 MG Tabs tablet  Commonly known as:  BRILINTA      TAKE these medications        aspirin EC 81 MG tablet  Take 81 mg by mouth daily.     atorvastatin 80 MG  tablet  Commonly known as:  LIPITOR  Take 1 tablet (80 mg total) by mouth daily at 6 PM.     ferrous sulfate 325 (65 FE) MG tablet  Take 1 tablet (325 mg total) by mouth 2 (two) times daily with a meal.     isosorbide mononitrate 30 MG 24 hr tablet  Commonly known as:  IMDUR  Take 0.5 tablets (15 mg total) by mouth daily.     lisinopril 2.5 MG tablet  Commonly known as:  PRINIVIL,ZESTRIL  Take 1 tablet (2.5 mg total) by mouth daily.     metoprolol tartrate 25 MG tablet  Commonly known as:  LOPRESSOR  Take 1 tablet (25 mg total) by mouth 2 (two) times daily.     nitroGLYCERIN 0.4 MG SL tablet  Commonly known as:  NITROSTAT  Place 1 tablet (0.4 mg total) under the tongue every 5 (five) minutes as needed for chest pain.     pantoprazole 40 MG tablet  Commonly known as:  PROTONIX  Take 1 tablet (40 mg total) by mouth daily.        Major procedures and Radiology Reports - PLEASE review detailed and final reports for all details, in brief -    Colonoscopy unremarkable  EGD  1) Bleeding penduculated fundic polyp s/p hemoclipping.  RECOMMENDATIONS: 1) Follow HGB. 2) Repeating the EGD in the near future to reevaluate the status of the polyp. _______________________________ eSigned: Carol Ada, MD 09/20/2015 9:55 AM   Dg Chest 2 View  09/17/2015  CLINICAL DATA:  Shortness of breath and chest tightness for 1 day EXAM: CHEST  2 VIEW COMPARISON:  December 20, 2011 FINDINGS: There is no edema or consolidation. The heart size and pulmonary vascularity are normal. No adenopathy. No pneumothorax. No bone lesions. IMPRESSION: No edema or consolidation. Electronically Signed   By: Lowella Grip III M.D.   On: 09/17/2015 11:12   US Transvaginal Non-ob  09/19/2015  CLINICAL DATA:  Dysfunctional uterine bleeding. EXAM: TRANSABDOMINAL AND TRANSVAGINAL ULTRASOUND OF PELVIS TECHNIQUE: Both transabdominal and transvaginal ultrasound examinations of the pelvis were performed.  Transabdominal technique was performed for global imaging of the pelvis including uterus, ovaries, adnexal regions, and pelvic cul-de-sac. It was necessary to proceed with endovaginal exam following the transabdominal exam to visualize the uterus and right adnexa to better advantage. COMPARISON:  CT, 06/29/2015 FINDINGS: Uterus Measurements: 14.2 x 7.5 x 6.4 cm. Multiple uterine masses consistent fibroids. Largest arises from the posterior right uterine fundus measuring 3.5 x 2.9 x 2.9 cm. Echogenic material within this is consistent with calcifications, which were present on the prior CT. There are at least 3 additional smaller fibroids. Endometrium Not visualized. Right ovary Not visualized.  No right adnexal mass. Left ovary Not visualized.  No left adnexal mass. Other findings No free fluid. IMPRESSION: 1. Enlarged uterus with multiple fibroids. 2. Endometrium not visualized. Neither ovary visualized. No adnexal masses. Electronically Signed   By: Lajean Manes M.D.   On: 09/19/2015 13:50   US Pelvis Complete  09/19/2015  CLINICAL DATA:  Dysfunctional uterine bleeding. EXAM: TRANSABDOMINAL AND TRANSVAGINAL ULTRASOUND OF PELVIS TECHNIQUE: Both transabdominal and transvaginal ultrasound examinations of the pelvis were performed. Transabdominal technique was performed for global imaging of the pelvis including uterus, ovaries, adnexal regions, and pelvic cul-de-sac. It was necessary to proceed with endovaginal exam following the transabdominal exam to visualize the uterus and right adnexa to better advantage. COMPARISON:  CT, 06/29/2015 FINDINGS: Uterus Measurements: 14.2 x 7.5 x 6.4 cm. Multiple uterine masses consistent fibroids. Largest arises from the posterior right uterine fundus measuring 3.5 x 2.9 x 2.9 cm. Echogenic material within this is consistent with calcifications, which were present on the prior CT. There are at least 3 additional smaller fibroids. Endometrium Not visualized. Right ovary Not  visualized.  No right adnexal mass. Left ovary Not visualized.  No left adnexal mass. Other findings No free fluid. IMPRESSION: 1. Enlarged uterus with multiple fibroids. 2. Endometrium not visualized. Neither ovary visualized. No adnexal masses. Electronically Signed   By: Lajean Manes M.D.   On: 09/19/2015 13:50    Micro Results      No results found for this or any previous visit (from the past 240 hour(s)).     Today   Subjective    Belynda Dose today has no headache,no chest abdominal pain,no new weakness tingling or numbness, feels much better wants to go home today.    Objective   Blood pressure 120/74, pulse 74, temperature 98.7 F (37.1 C), temperature source Oral, resp. rate 18, height 5\' 6"  (1.676 m), weight 76.703 kg (169 lb 1.6 oz), SpO2 100 %.   Intake/Output Summary (Last 24 hours) at 09/22/15 1046 Last data filed at 09/22/15 0458  Gross per 24 hour  Intake   1320 ml  Output   1300 ml  Net     20 ml    Exam Awake Alert, Oriented x 3, No new F.N deficits, Normal affect [place].AT,PERRAL Supple Neck,No JVD, No cervical lymphadenopathy appriciated.  Symmetrical Chest wall  movement, Good air movement bilaterally, CTAB RRR,No Gallops,Rubs or new Murmurs, No Parasternal Heave +ve B.Sounds, Abd Soft, Non tender, No organomegaly appriciated, No rebound -guarding or rigidity. No Cyanosis, Clubbing or edema, No new Rash or bruise   Data Review   CBC w Diff: Lab Results  Component Value Date   WBC 6.3 09/22/2015   HGB 9.0* 09/22/2015   HCT 28.6* 09/22/2015   PLT 195 09/22/2015   LYMPHOPCT 39 09/17/2015   MONOPCT 6 09/17/2015   EOSPCT 6 09/17/2015   BASOPCT 0 09/17/2015    CMP: Lab Results  Component Value Date   NA 139 09/22/2015   K 3.6 09/22/2015   CL 110 09/22/2015   CO2 25 09/22/2015   BUN <5* 09/22/2015   CREATININE 0.92 09/22/2015   CREATININE 1.18* 06/23/2015   PROT 6.7 09/17/2015   ALBUMIN 3.5 09/17/2015   BILITOT 0.5 09/17/2015   ALKPHOS  87 09/17/2015   AST 15 09/17/2015   ALT 14 09/17/2015  .   Total Time in preparing paper work, data evaluation and todays exam - 35 minutes  Thurnell Lose M.D on 09/22/2015 at 10:46 AM  [facility]   Office  [phone]

## 2015-09-28 ENCOUNTER — Telehealth: Payer: Self-pay | Admitting: *Deleted

## 2015-09-28 ENCOUNTER — Ambulatory Visit (INDEPENDENT_AMBULATORY_CARE_PROVIDER_SITE_OTHER): Payer: No Typology Code available for payment source | Admitting: Physician Assistant

## 2015-09-28 ENCOUNTER — Other Ambulatory Visit (INDEPENDENT_AMBULATORY_CARE_PROVIDER_SITE_OTHER): Payer: No Typology Code available for payment source

## 2015-09-28 ENCOUNTER — Encounter: Payer: Self-pay | Admitting: Physician Assistant

## 2015-09-28 VITALS — BP 124/60 | HR 80 | Ht 66.0 in | Wt 169.0 lb

## 2015-09-28 DIAGNOSIS — K317 Polyp of stomach and duodenum: Secondary | ICD-10-CM

## 2015-09-28 DIAGNOSIS — D500 Iron deficiency anemia secondary to blood loss (chronic): Secondary | ICD-10-CM

## 2015-09-28 DIAGNOSIS — Z8719 Personal history of other diseases of the digestive system: Secondary | ICD-10-CM

## 2015-09-28 NOTE — Patient Instructions (Signed)
Please go to the basement level to have your labs drawn.  You have been scheduled for an endoscopy. Please follow written instructions given to you at your visit today. If you use inhalers (even only as needed), please bring them with you on the day of your procedure. Your physician has requested that you go to www.startemmi.com and enter the access code given to you at your visit today. This web site gives a general overview about your procedure. However, you should still follow specific instructions given to you by our office regarding your preparation for the procedure. 

## 2015-09-28 NOTE — Telephone Encounter (Signed)
  09/28/2015   RE: Virginia [name] DOB: 11/21/1960 MRN: [id number]   Dear Dr. Irish Lack,    We have scheduled the above patient for an endoscopic procedure. Our records show that she was on Brilenta. Her Endoscopy is scheduled for 10-09-2015. We advised her to stay off the [place]. We wanted to inform you of her procedure. Please contact us if you have any concerns.     Sincerely,    Virginia Esterwood PA-C

## 2015-09-28 NOTE — Progress Notes (Signed)
Patient ID: Virginia [name], female   DOB: 1960-11-20, 54 y.o.   MRN: [id number]   Subjective:    Patient ID: Virginia [name], female    DOB: [birth year], 54 y.o.   MRN: [id number]  HPI [name]  Is a pleasant 54 year old African-American female who comes in today for post hospital follow-up. She was initially seen by Dr. Benson Norway in consultation and then Dr. Silverio Decamp  during recent admission 1111 through 09/22/2015. She was admitted with fatigue and was found to have had a drop in her hemoglobin from baseline of 13 down to 9.6. She was heme positive and complaining of melena intermittently.  Patient had an MI in August 2016 and had a bare-metal stent placed, and has been on Brilinta .She is being followed by Dr. Irish Lack Other medical problems include diabetes mellitus and hypertension. During admission she underwent EGD and colonoscopy per Dr. Benson Norway. Colonoscopy was normal and EGD a large white-based pedunculated fundic polyp which was actively oozing from the apex of the polyp. 3 hemoclips were placed at the base of the polyp which arrested the bleeding. Polyp was not resected because patient was on Brilinta . It was felt that she would need repeat EGD to reevaluate the polyp and assess regarding removal.  Patient was left off polenta at the time of discharge for the hospital it is her understanding she will be off for 2 weeks. She had also been having some dysfunctional uterine bleeding. She's been seen by GYN and started on Megace which has resolved the uterine bleeding.  Last hemoglobin on 09/22/2015 was 9 hematocrit of 28.6. 1 Patient says that her stools are still dark however she is on iron and it's hard for her to tell whether they contain any blood. She is having one bowel movement per day otherwise feels line. She has no complaints of abdominal discomfort and nausea etc.  Review of Systems Pertinent positive and negative review of systems were noted in the above HPI section.  All other review of  systems was otherwise negative.  Outpatient Encounter Prescriptions as of 09/28/2015  Medication Sig  . aspirin EC 81 MG tablet Take 81 mg by mouth daily.  Marland Kitchen atorvastatin (LIPITOR) 80 MG tablet Take 1 tablet (80 mg total) by mouth daily at 6 PM.  . ferrous sulfate 325 (65 FE) MG tablet Take 1 tablet (325 mg total) by mouth 2 (two) times daily with a meal.  . isosorbide mononitrate (IMDUR) 30 MG 24 hr tablet Take 0.5 tablets (15 mg total) by mouth daily.  Marland Kitchen lisinopril (PRINIVIL,ZESTRIL) 2.5 MG tablet Take 1 tablet (2.5 mg total) by mouth daily.  . megestrol (MEGACE) 40 MG tablet Take 2 tablets (80 mg total) by mouth 2 (two) times daily.  . metoprolol tartrate (LOPRESSOR) 25 MG tablet Take 1 tablet (25 mg total) by mouth 2 (two) times daily.  . nitroGLYCERIN (NITROSTAT) 0.4 MG SL tablet Place 1 tablet (0.4 mg total) under the tongue every 5 (five) minutes as needed for chest pain.  . pantoprazole (PROTONIX) 40 MG tablet Take 1 tablet (40 mg total) by mouth daily.   No facility-administered encounter medications on file as of 09/28/2015.   No Known Allergies Patient Active Problem List   Diagnosis Date Noted  . Acute GI bleeding   . Symptomatic anemia 09/19/2015  . Vasovagal near syncope 09/19/2015  . Dysfunctional uterine bleeding 09/19/2015  . Diarrhea due to drug 09/19/2015  . Heme positive stool 09/19/2015  . Chest pain 09/17/2015  . Multinodular  goiter 07/09/2015  . CAD S/P CFX BMS (? med complinace) x 2 06/16/15 07/01/2015  . Acute renal insufficiency 07/01/2015  . Abdominal pain 07/01/2015  . Nodular goiter 06/23/2015  . Diabetes mellitus type 2, controlled ([place]) 06/23/2015  . STEMI -06/16/15 06/17/2015  . Hyperlipidemia with target LDL less than 70    Social History   Social History  . Marital Status: Married    Spouse Name: N/A  . Number of Children: N/A  . Years of Education: N/A   Occupational History  . Not on file.   Social History Main Topics  . Smoking status:  Never Smoker   . Smokeless tobacco: Never Used  . Alcohol Use: No  . Drug Use: No  . Sexual Activity: Not on file   Other Topics Concern  . Not on file   Social History Narrative    Ms. Polio's family history includes Asthma in her paternal uncle; Cancer in her father; Heart attack in her brother and maternal uncle; Hyperlipidemia in her sister; Hypertension in her brother and sister; Stroke in her mother. There is no history of Thyroid disease.      Objective:    Filed Vitals:   09/28/15 0846  BP: 124/60  Pulse: 80    Physical Exam   Well-developed African American female in no acute distress, pleasant , ambulating with a walker blood pressure 124/60 pulse 80 height 5 foot 6 weight 169. HEENT; nontraumatic normocephalic EOMI PERRLA sclera anicteric, Cardiovascular; regular rate and rhythm with S1-S2 no murmur or gallop, Pulmonary; clear bilaterally, Abdomen; soft, nontender, nondistended, bowel sounds are active there is no palpable mass or hepatosplenomegaly, Rectal ;exam not done, Ext;no clubbing cyanosis or edema skin warm and dry, Neuropsych ;mood and affect appropriate       Assessment & Plan:   #1 54 yo female with recent admit with fatigue, melena, anemia in setting of chronic anticoagulation with Brilinta. Colon normal Egd revealed a large wide based pedunculated gastric polyp which was oozing from apex- endoclipped x 3 at bases with hemostasis. Not Biopsied or removed  #2 CAD s/p MI 06/2015 -s/p bare metal stent- on Brilinta  #3 HTN #4 Dysfunctional uterine bleeding- resolved on megace  Plan; CBC today and will needs serial hgbs to monitor for ongoing bleeding  Schedule EGD with Dr . Silverio Decamp  To biopsy polyp initially , and reassess. Scheduled for 10/09/2015-so pt will remain off Brilinta until post EGD, then discuss regarding timing of resuming with her cardiologist.Procedure discussed in detail with pt and she is agreeable to proceed. She was also advised to seek  care through the ER should she have any signs of more overt bleeding.     Amy Genia Harold PA-C 09/28/2015   Cc: Tresa Garter, MD

## 2015-09-29 ENCOUNTER — Ambulatory Visit (INDEPENDENT_AMBULATORY_CARE_PROVIDER_SITE_OTHER): Payer: No Typology Code available for payment source | Admitting: Internal Medicine

## 2015-09-29 ENCOUNTER — Encounter: Payer: Self-pay | Admitting: Internal Medicine

## 2015-09-29 VITALS — BP 114/69 | HR 72 | Temp 98.5°F | Resp 20 | Ht 66.0 in | Wt 173.0 lb

## 2015-09-29 DIAGNOSIS — I213 ST elevation (STEMI) myocardial infarction of unspecified site: Secondary | ICD-10-CM

## 2015-09-29 DIAGNOSIS — I252 Old myocardial infarction: Secondary | ICD-10-CM

## 2015-09-29 DIAGNOSIS — Z09 Encounter for follow-up examination after completed treatment for conditions other than malignant neoplasm: Secondary | ICD-10-CM

## 2015-09-29 DIAGNOSIS — E118 Type 2 diabetes mellitus with unspecified complications: Secondary | ICD-10-CM

## 2015-09-29 LAB — CBC WITH DIFFERENTIAL/PLATELET
BASOS ABS: 0 10*3/uL (ref 0.0–0.1)
Basophils Relative: 0.6 % (ref 0.0–3.0)
EOS ABS: 0.4 10*3/uL (ref 0.0–0.7)
Eosinophils Relative: 6.7 % — ABNORMAL HIGH (ref 0.0–5.0)
HEMATOCRIT: 31.4 % — AB (ref 36.0–46.0)
HEMOGLOBIN: 10.3 g/dL — AB (ref 12.0–15.0)
LYMPHS PCT: 33.3 % (ref 12.0–46.0)
Lymphs Abs: 2 10*3/uL (ref 0.7–4.0)
MCHC: 32.8 g/dL (ref 30.0–36.0)
MCV: 89.8 fl (ref 78.0–100.0)
MONOS PCT: 4.7 % (ref 3.0–12.0)
Monocytes Absolute: 0.3 10*3/uL (ref 0.1–1.0)
NEUTROS ABS: 3.3 10*3/uL (ref 1.4–7.7)
Neutrophils Relative %: 54.7 % (ref 43.0–77.0)
PLATELETS: 267 10*3/uL (ref 150.0–400.0)
RBC: 3.49 Mil/uL — AB (ref 3.87–5.11)
RDW: 16.6 % — ABNORMAL HIGH (ref 11.5–15.5)
WBC: 6 10*3/uL (ref 4.0–10.5)

## 2015-09-29 LAB — GLUCOSE, CAPILLARY: Glucose-Capillary: 86 mg/dL (ref 65–99)

## 2015-09-29 MED ORDER — GLIMEPIRIDE 2 MG PO TABS: 2.0000 mg | ORAL_TABLET | Freq: Every day | ORAL | Status: DC

## 2015-09-29 NOTE — Progress Notes (Signed)
Patient ID: Virginia [name], female   DOB: December 17, 1960, 54 y.o.   MRN: [id number]   Virginia [name], is a 54 y.o. female  [id number]  [id number]  DOB - 1961/05/06  No chief complaint on file.       Subjective:   Virginia [name] is a 54 y.o. female here today for a follow up visit. Patient has history of CAD status post 2 bare metal stents in August 2016, dyslipidemia and was recently diagnosed with type 2 diabetes mellitus in August 2016 and started on metformin. In September she experienced significant dysfunctional uterine bleeding and has been evaluated as an outpatient by her GYN. She presented to the ER on 11/12 with extreme fatigue after just being discharged same day from the hospital. During that admission she presented with chest pain and underwent a repeat cardiac catheterization which demonstrated patent stents with a small stenosis of diagonal branch. Her nitrate dose was increased and no other medications were changed. Patient reported that she has been feeling fatigued especially since the most recent admission and she went to the bathroom and had a black bowel movement. After experiencing this black bowel movement she reported she felt very weak and fell against the bed. EMS was called to the home and her blood pressure was 90/40. She was also reporting dizziness. Upon further questioning of the patient she reports that she has been having watery crampy diarrhea since September 2016 after starting metformin.   In the ER patient was afebrile, BP was 107/63 with a heart rate of 71 and a RR of13, room air saturations were 100%, partial orthostatic vital signs were negative. Laboratory data unremarkable except for mild renal insufficiency with a creatinine of 1.05 with a previous creatinine of 0.9. Troponin was negative 2 collections, hemoglobin had decreased to 9.6 from baseline of about 13. Hemoccult ++. EKG was unremarkable.Patient was subsequently admitted for symptomatic upper GI  bleeding and generalized weakness due to anemia. She underwent colonoscopy and EGD, colonoscopy was unremarkable, EGD showed bleeding polyp which was hemoclipped on 09/20/2015. Patient did not require blood transfusion. Patient was also noted to have uterine fibroids with dysfunctional uterine bleeding. She has been scheduled to see gynecologist for possible biopsy and further management. Patient was discharged home when she became stable to be followed up in our clinic today.  She has no new complaints today. She has a scheduled appointment with the gastroenterologist for possible repeat upper GI endoscopy to reevaluate the bleeding polyp and possible polypectomy if bleeding persists. Brilinta is currently on hold due to the bleeding but may restart possibly after visit with the cardiologist. Due to intolerant to metformin, the medication has been stopped and patient is now in need of another form of oral hypoglycemic agent. Her hemoglobin A1c today is 6.4%.  ALLERGIES: No Known Allergies  PAST MEDICAL HISTORY: Past Medical History  Diagnosis Date  . Hyperlipidemia with target LDL less than 70   . STEMI (ST elevation myocardial infarction) ([place]) 06/16/2015    BMS x 2 CFX  . Diabetes mellitus without complication ([place])   . Hypertension   . CHF (congestive heart failure) ([place])   . CAD (coronary artery disease)     a. 06/2015 BMS x2 to LCx. b. recurrent CP, cath 09/18/2015 patent stent, tiny diag stenosis medical therapy.   Marland Kitchen GERD (gastroesophageal reflux disease)     ?    MEDICATIONS AT HOME: Prior to Admission medications   Medication Sig Start Date End Date Taking? Authorizing Provider  aspirin EC 81 MG tablet Take 81 mg by mouth daily.   Yes Historical Provider, MD  atorvastatin (LIPITOR) 80 MG tablet Take 1 tablet (80 mg total) by mouth daily at 6 PM. 09/18/15  Yes Wandra Mannan, MD  ferrous sulfate 325 (65 FE) MG tablet Take 1 tablet (325 mg total) by mouth 2 (two) times daily with a  meal. 09/22/15  Yes Thurnell Lose, MD  isosorbide mononitrate (IMDUR) 30 MG 24 hr tablet Take 0.5 tablets (15 mg total) by mouth daily. 09/18/15  Yes Almyra Deforest, PA  lisinopril (PRINIVIL,ZESTRIL) 2.5 MG tablet Take 1 tablet (2.5 mg total) by mouth daily. 06/23/15  Yes Tresa Garter, MD  megestrol (MEGACE) 40 MG tablet Take 2 tablets (80 mg total) by mouth 2 (two) times daily. 09/22/15  Yes Thurnell Lose, MD  metoprolol tartrate (LOPRESSOR) 25 MG tablet Take 1 tablet (25 mg total) by mouth 2 (two) times daily. 06/23/15  Yes Tresa Garter, MD  nitroGLYCERIN (NITROSTAT) 0.4 MG SL tablet Place 1 tablet (0.4 mg total) under the tongue every 5 (five) minutes as needed for chest pain. 06/19/15  Yes Brittainy Erie Noe, PA-C  pantoprazole (PROTONIX) 40 MG tablet Take 1 tablet (40 mg total) by mouth daily. 09/22/15  Yes Thurnell Lose, MD  glimepiride (AMARYL) 2 MG tablet Take 1 tablet (2 mg total) by mouth daily before breakfast. 09/29/15   Tresa Garter, MD     Objective:   Filed Vitals:   09/29/15 0944  BP: 114/69  Pulse: 72  Temp: 98.5 F (36.9 C)  TempSrc: Oral  Resp: 20  Height: 5\' 6"  (1.676 m)  Weight: 173 lb (78.472 kg)  SpO2: 100%    Exam General appearance : Awake, alert, not in any distress. Speech Clear. Not toxic looking HEENT: Atraumatic and Normocephalic, pupils equally reactive to light and accomodation Neck: supple, no JVD. No cervical lymphadenopathy.  Chest:Good air entry bilaterally, no added sounds  CVS: S1 S2 regular, no murmurs.  Abdomen: Bowel sounds present, Non tender and not distended with no gaurding, rigidity or rebound. Extremities: B/L Lower Ext shows no edema, both legs are warm to touch Neurology: Awake alert, and oriented X 3, CN II-XII intact, Non focal Skin:No Rash  Data Review Lab Results  Component Value Date   HGBA1C 6.4* 09/19/2015   HGBA1C 6.6* 06/17/2015     Assessment & Plan   1. Type 2 diabetes mellitus with  complication, without long-term current use of insulin (HCC)  - Hemoglobin A1c 6.4% today, this may be falsely low hemoglobin A1c because of anemia - Discontinue metformin on account of diarrhea and dehydration - Start - glimepiride (AMARYL) 2 MG tablet; Take 1 tablet (2 mg total) by mouth daily before breakfast.  Dispense: 30 tablet; Refill: 3 - Hypoglycemic precautions emphasized   Aim for 30 minutes of exercise most days. Rethink what you drink. Water is great! Aim for 2-3 Carb Choices per meal (30-45 grams) +/- 1 either way  Aim for 0-15 Carbs per snack if hungry  Include protein in moderation with your meals and snacks  Consider reading food labels for Total Carbohydrate and Fat Grams of foods  Consider checking BG at alternate times per day  Continue taking medication as directed Be mindful about how much sugar you are adding to beverages and other foods. Fruit Punch - find one with no sugar  Measure and decrease portions of carbohydrate foods  Make your plate and don't go back for  seconds  2. Follow-up of acute heart attack [facility])   - Continue current management - Follow-up with cardiologist  Patient have been counseled extensively about nutrition and exercise  Return in about 3 months (around 12/30/2015) for Hemoglobin A1C and Follow up, DM, Follow up HTN.  The patient was given clear instructions to go to ER or return to medical center if symptoms don't improve, worsen or new problems develop. The patient verbalized understanding. The patient was told to call to get lab results if they haven't heard anything in the next week.   This note has been created with Surveyor, quantity. Any transcriptional errors are unintentional.    [name], MD, [place], Karilyn Cota, [place] and [place], [place]   09/29/2015, 10:46 AM

## 2015-09-29 NOTE — Patient Instructions (Signed)
Diabetes and Exercise Exercising regularly is important. It is not just about losing weight. It has many health benefits, such as:  Improving your overall fitness, flexibility, and endurance.  Increasing your bone density.  Helping with weight control.  Decreasing your body fat.  Increasing your muscle strength.  Reducing stress and tension.  Improving your overall health. People with diabetes who exercise gain additional benefits because exercise:  Reduces appetite.  Improves the body's use of blood sugar (glucose).  Helps lower or control blood glucose.  Decreases blood pressure.  Helps control blood lipids (such as cholesterol and triglycerides).  Improves the body's use of the hormone insulin by:  Increasing the body's insulin sensitivity.  Reducing the body's insulin needs.  Decreases the risk for heart disease because exercising:  Lowers cholesterol and triglycerides levels.  Increases the levels of good cholesterol (such as high-density lipoproteins [HDL]) in the body.  Lowers blood glucose levels. YOUR ACTIVITY PLAN  Choose an activity that you enjoy, and set realistic goals. To exercise safely, you should begin practicing any new physical activity slowly, and gradually increase the intensity of the exercise over time. Your health care provider or diabetes educator can help create an activity plan that works for you. General recommendations include:  Encouraging children to engage in at least 60 minutes of physical activity each day.  Stretching and performing strength training exercises, such as yoga or weight lifting, at least 2 times per week.  Performing a total of at least 150 minutes of moderate-intensity exercise each week, such as brisk walking or water aerobics.  Exercising at least 3 days per week, making sure you allow no more than 2 consecutive days to pass without exercising.  Avoiding long periods of inactivity (90 minutes or more). When you  have to spend an extended period of time sitting down, take frequent breaks to walk or stretch. RECOMMENDATIONS FOR EXERCISING WITH TYPE 1 OR TYPE 2 DIABETES   Check your blood glucose before exercising. If blood glucose levels are greater than 240 mg/dL, check for urine ketones. Do not exercise if ketones are present.  Avoid injecting insulin into areas of the body that are going to be exercised. For example, avoid injecting insulin into:  The arms when playing tennis.  The legs when jogging.  Keep a record of:  Food intake before and after you exercise.  Expected peak times of insulin action.  Blood glucose levels before and after you exercise.  The type and amount of exercise you have done.  Review your records with your health care provider. Your health care provider will help you to develop guidelines for adjusting food intake and insulin amounts before and after exercising.  If you take insulin or oral hypoglycemic agents, watch for signs and symptoms of hypoglycemia. They include:  Dizziness.  Shaking.  Sweating.  Chills.  Confusion.  Drink plenty of water while you exercise to prevent dehydration or heat stroke. Body water is lost during exercise and must be replaced.  Talk to your health care provider before starting an exercise program to make sure it is safe for you. Remember, almost any type of activity is better than none.   This information is not intended to replace advice given to you by your health care provider. Make sure you discuss any questions you have with your health care provider.   Document Released: 01/14/2004 Document Revised: 03/10/2015 Document Reviewed: 04/02/2013 Elsevier Interactive Patient Education 2016 Elsevier Inc. Basic Carbohydrate Counting for Diabetes Mellitus Carbohydrate counting   is a method for keeping track of the amount of carbohydrates you eat. Eating carbohydrates naturally increases the level of sugar (glucose) in your  blood, so it is important for you to know the amount that is okay for you to have in every meal. Carbohydrate counting helps keep the level of glucose in your blood within normal limits. The amount of carbohydrates allowed is different for every person. A dietitian can help you calculate the amount that is right for you. Once you know the amount of carbohydrates you can have, you can count the carbohydrates in the foods you want to eat. Carbohydrates are found in the following foods:  Grains, such as breads and cereals.  Dried beans and soy products.  Starchy vegetables, such as potatoes, peas, and corn.  Fruit and fruit juices.  Milk and yogurt.  Sweets and snack foods, such as cake, cookies, candy, chips, soft drinks, and fruit drinks. CARBOHYDRATE COUNTING There are two ways to count the carbohydrates in your food. You can use either of the methods or a combination of both. Reading the "Nutrition Facts" on Packaged Food The "Nutrition Facts" is an area that is included on the labels of almost all packaged food and beverages in the [place]. It includes the serving size of that food or beverage and information about the nutrients in each serving of the food, including the grams (g) of carbohydrate per serving.  Decide the number of servings of this food or beverage that you will be able to eat or drink. Multiply that number of servings by the number of grams of carbohydrate that is listed on the label for that serving. The total will be the amount of carbohydrates you will be having when you eat or drink this food or beverage. Learning Standard Serving Sizes of Food When you eat food that is not packaged or does not include "Nutrition Facts" on the label, you need to measure the servings in order to count the amount of carbohydrates.A serving of most carbohydrate-rich foods contains about 15 g of carbohydrates. The following list includes serving sizes of carbohydrate-rich foods that  provide 15 g ofcarbohydrate per serving:   1 slice of bread (1 oz) or 1 six-inch tortilla.    of a hamburger bun or English muffin.  4-6 crackers.   cup unsweetened dry cereal.    cup hot cereal.   cup rice or pasta.    cup mashed potatoes or  of a large baked potato.  1 cup fresh fruit or one small piece of fruit.    cup canned or frozen fruit or fruit juice.  1 cup milk.   cup plain fat-free yogurt or yogurt sweetened with artificial sweeteners.   cup cooked dried beans or starchy vegetable, such as peas, corn, or potatoes.  Decide the number of standard-size servings that you will eat. Multiply that number of servings by 15 (the grams of carbohydrates in that serving). For example, if you eat 2 cups of strawberries, you will have eaten 2 servings and 30 g of carbohydrates (2 servings x 15 g = 30 g). For foods such as soups and casseroles, in which more than one food is mixed in, you will need to count the carbohydrates in each food that is included. EXAMPLE OF CARBOHYDRATE COUNTING Sample Dinner  3 oz chicken breast.   cup of brown rice.   cup of corn.  1 cup milk.   1 cup strawberries with sugar-free whipped topping.  Carbohydrate Calculation Step   1: Identify the foods that contain carbohydrates:   Rice.   Corn.   Milk.   Strawberries. Step 2:Calculate the number of servings eaten of each:   2 servings of rice.   1 serving of corn.   1 serving of milk.   1 serving of strawberries. Step 3: Multiply each of those number of servings by 15 g:   2 servings of rice x 15 g = 30 g.   1 serving of corn x 15 g = 15 g.   1 serving of milk x 15 g = 15 g.   1 serving of strawberries x 15 g = 15 g. Step 4: Add together all of the amounts to find the total grams of carbohydrates eaten: 30 g + 15 g + 15 g + 15 g = 75 g.   This information is not intended to replace advice given to you by your health care provider. Make sure you  discuss any questions you have with your health care provider.   Document Released: 10/24/2005 Document Revised: 11/14/2014 Document Reviewed: 09/20/2013 Elsevier Interactive Patient Education 2016 Elsevier Inc.  

## 2015-09-29 NOTE — Progress Notes (Signed)
Reviewed and agree with documentation and assessment and plan. K. Veena Anique Beckley , MD   

## 2015-09-29 NOTE — Progress Notes (Signed)
Patient here for 3 month F/U.  Patient denies pain at this time.  [place]

## 2015-09-30 ENCOUNTER — Other Ambulatory Visit: Payer: Self-pay | Admitting: Internal Medicine

## 2015-09-30 MED ORDER — TICAGRELOR 90 MG PO TABS: 90.0000 mg | ORAL_TABLET | Freq: Two times a day (BID) | ORAL | Status: DC

## 2015-10-05 ENCOUNTER — Encounter: Payer: Self-pay | Admitting: Physician Assistant

## 2015-10-05 ENCOUNTER — Telehealth: Payer: Self-pay | Admitting: General Practice

## 2015-10-05 ENCOUNTER — Ambulatory Visit (INDEPENDENT_AMBULATORY_CARE_PROVIDER_SITE_OTHER): Payer: No Typology Code available for payment source | Admitting: Physician Assistant

## 2015-10-05 ENCOUNTER — Other Ambulatory Visit: Payer: Self-pay

## 2015-10-05 VITALS — BP 112/60 | HR 80 | Ht 66.0 in | Wt 171.4 lb

## 2015-10-05 DIAGNOSIS — R202 Paresthesia of skin: Secondary | ICD-10-CM

## 2015-10-05 DIAGNOSIS — R29898 Other symptoms and signs involving the musculoskeletal system: Secondary | ICD-10-CM

## 2015-10-05 DIAGNOSIS — D62 Acute posthemorrhagic anemia: Secondary | ICD-10-CM

## 2015-10-05 DIAGNOSIS — R200 Anesthesia of skin: Secondary | ICD-10-CM

## 2015-10-05 DIAGNOSIS — Z9861 Coronary angioplasty status: Secondary | ICD-10-CM

## 2015-10-05 DIAGNOSIS — N938 Other specified abnormal uterine and vaginal bleeding: Secondary | ICD-10-CM

## 2015-10-05 DIAGNOSIS — I2121 ST elevation (STEMI) myocardial infarction involving left circumflex coronary artery: Secondary | ICD-10-CM

## 2015-10-05 DIAGNOSIS — I2510 Atherosclerotic heart disease of native coronary artery without angina pectoris: Secondary | ICD-10-CM

## 2015-10-05 DIAGNOSIS — K922 Gastrointestinal hemorrhage, unspecified: Secondary | ICD-10-CM

## 2015-10-05 MED ORDER — MEGESTROL ACETATE 40 MG PO TABS: 80.0000 mg | ORAL_TABLET | Freq: Two times a day (BID) | ORAL | Status: DC

## 2015-10-05 NOTE — Assessment & Plan Note (Addendum)
Patient is stable from a cardiac standpoint. Repeat cardiac catheterization 09/18/15 showed a widely patent stent in the AV groove circumflex vessel, normal RCA, 60% proximal LAD, 95% diagonal 2 is normal LV function. Medical therapy was recommended. Imdur was added. She has had no further chest pain since then. She is off Brilinta because of GI and vaginal bleeding. She is having a repeat endoscopy on Friday to remove the polyp. Decision will be made then whether or not she can go back on Brilinta. She still has no visit for the vaginal bleeding. Early f/u with Dr. Irish Lack in 1 month.

## 2015-10-05 NOTE — Assessment & Plan Note (Signed)
Patient had the polyp clipped but is having a repeat endoscopy on Friday for possible removal of the polyp. She has been off Brilinta for 2 weeks. Decision to be made after the endoscopy whether or not she can go back on Brilinta.

## 2015-10-05 NOTE — Assessment & Plan Note (Signed)
Patient complains of right leg weakness, loss of strength and numbness that occurs about 3 days a week ever since her cardiac catheterization. She is walking with a walker because of this. She has no pain. There is no evidence of swelling at cath site or hematoma or hemorrhage. Recommend she try physical therapy. If this does not work she may need to see a neurologist.

## 2015-10-05 NOTE — Progress Notes (Signed)
Cardiology Office Note   Date:  10/05/2015   ID:  Virginia [name], DOB 1961-01-23, MRN [id number]  PCP:  [name], MD  Cardiologist:  Dr. Irish Lack  Chief Complaint: Right leg weakness    History of Present Illness: Virginia [name] is a 54 y.o. female who presents for post hospital f/u. She has history of CAD with lateral STEMI 06/2015 treated with bare-metal stent 2 to the circumflex. She had recurrent chest pain and underwent cardiac cath 09/18/15 that showed patent stents with tiny diagonal 90% stenosis treated medically. Nitrates were added. She had also had dysfunctional uterine bleeding. She was readmitted 09/19/15 with a GI bleed. EGD showed bleeding polyp which was hemoclipped which arrested the bleeding but the polyp was not resected because she was on Brilinta, colonoscopy unremarkable. Because of her bleeding Brilinta was stopped for a few weeks. She was continued on aspirin, beta blocker, ACE inhibitor, nitrates and statins. She also had mild acute renal failure due to anemia resolved with hydration, dyslipidemia and diabetes mellitus type 2.  Patient has been off Aucilla for 2 weeks and is having another endo on Friday to resect the polyp.  Patient isn't having any chest pain or shortness of breath. She is having some palpitations and swelling of her hands and feet. Since the anemia is being treated her palpitations have decreased.She is alsoo having some trouble with her right leg. She says it totally gives out and she was sent home with a walker. She says it started after the cath. She loses strength in her right leg associated with numbness of loss of feeling in it. It happens about 3 times/week and it lasts about 45 min.   Past Medical History  Diagnosis Date  . Hyperlipidemia with target LDL less than 70   . STEMI (ST elevation myocardial infarction) ([place]) 06/16/2015    BMS x 2 CFX  . Diabetes mellitus without complication ([employer])   . Hypertension   . CHF  (congestive heart failure) ([place])   . CAD (coronary artery disease)     a. 06/2015 BMS x2 to LCx. b. recurrent CP, cath 09/18/2015 patent stent, tiny diag stenosis medical therapy.   Marland Kitchen GERD (gastroesophageal reflux disease)     ?    Past Surgical History  Procedure Laterality Date  . Appendectomy    . Cesarean section    . Cardiac catheterization N/A 06/17/2015    Procedure: Left Heart Cath and Coronary Angiography;  Surgeon: Jettie Booze, MD;  Location: [facility];  Service: Cardiovascular;  Laterality: N/A;  . Cardiac catheterization  06/17/2015    Procedure: Coronary Stent Intervention;  Surgeon: Jettie Booze, MD;  Location: [facility];  Service: Cardiovascular;;  . Cardiac catheterization N/A 09/18/2015    Procedure: Left Heart Cath and Coronary Angiography;  Surgeon: Troy Sine, MD;  Location: [facility];  Service: Cardiovascular;  Laterality: N/A;  . Esophagogastroduodenoscopy N/A 09/20/2015    Procedure: ESOPHAGOGASTRODUODENOSCOPY (EGD);  Surgeon: Carol Ada, MD;  Location: [facility];  Service: Endoscopy;  Laterality: N/A;  . Colonoscopy N/A 09/20/2015    Procedure: COLONOSCOPY;  Surgeon: Carol Ada, MD;  Location: [facility];  Service: Endoscopy;  Laterality: N/A;     Current Outpatient Prescriptions  Medication Sig Dispense Refill  . aspirin EC 81 MG tablet Take 81 mg by mouth daily.    Marland Kitchen atorvastatin (LIPITOR) 80 MG tablet Take 1 tablet (80 mg total) by mouth daily at 6 PM. 90 tablet  3  . ferrous sulfate 325 (65 FE) MG tablet Take 1 tablet (325 mg total) by mouth 2 (two) times daily with a meal. 60 tablet 0  . glimepiride (AMARYL) 2 MG tablet Take 1 tablet (2 mg total) by mouth daily before breakfast. 30 tablet 3  . isosorbide mononitrate (IMDUR) 30 MG 24 hr tablet Take 0.5 tablets (15 mg total) by mouth daily. 30 tablet 5  . lisinopril (PRINIVIL,ZESTRIL) 2.5 MG tablet Take 1 tablet (2.5 mg total) by mouth daily. 90 tablet 3  .  megestrol (MEGACE) 40 MG tablet Take 2 tablets (80 mg total) by mouth 2 (two) times daily. 60 tablet 1  . metoprolol tartrate (LOPRESSOR) 25 MG tablet Take 1 tablet (25 mg total) by mouth 2 (two) times daily. 180 tablet 3  . nitroGLYCERIN (NITROSTAT) 0.4 MG SL tablet Place 1 tablet (0.4 mg total) under the tongue every 5 (five) minutes as needed for chest pain. 25 tablet 2  . pantoprazole (PROTONIX) 40 MG tablet Take 1 tablet (40 mg total) by mouth daily. 30 tablet 0  . ticagrelor (BRILINTA) 90 MG TABS tablet Take 1 tablet (90 mg total) by mouth 2 (two) times daily. (Patient not taking: Reported on 10/05/2015) 180 tablet 3   No current facility-administered medications for this visit.    Allergies:   Review of patient's allergies indicates no known allergies.    Social History:  The patient  reports that she has never smoked. She has never used smokeless tobacco. She reports that she does not drink alcohol or use illicit drugs.   Family History:  The patient's   family history includes Asthma in her paternal uncle; Cancer in her father; Heart attack in her brother and maternal uncle; Hyperlipidemia in her sister; Hypertension in her brother and sister; Stroke in her mother. There is no history of Thyroid disease.    ROS:  Please see the history of present illness.   Otherwise, review of systems are positive for black stools, leg swelling, easy bruising, balance issues.   All other systems are reviewed and negative.    PHYSICAL EXAM: VS:  BP 112/60 mmHg  Pulse 80  Ht 5\' 6"  (1.676 m)  Wt 171 lb 6.4 oz (77.747 kg)  BMI 27.68 kg/m2  LMP 07/20/2015 , BMI Body mass index is 27.68 kg/(m^2). GEN: Well nourished, well developed, in no acute distress Neck: no JVD, HJR, carotid bruits, or masses Cardiac:  RRR; no murmurs,gallop, rubs, thrill or heave,  Respiratory:  clear to auscultation bilaterally, normal work of breathing GI: soft, nontender, nondistended, + BS MS: no deformity or  atrophy Extremities: Right groin without hematoma or hemorrhage, some bruising at cath site but no significant swelling or pain otherwise lower extremities without cyanosis, clubbing, edema, good distal pulses bilaterally.  Skin: warm and dry, no rash Neuro:  Strength and sensation are intact    EKG:  EKG is ordered today. The ekg ordered today demonstrates normal sinus rhythm, normal EKG   Recent Labs: 06/17/2015: TSH 0.963 09/17/2015: ALT 14; B Natriuretic Peptide 62.9 09/22/2015: BUN <5*; Creatinine, Ser 0.92; Potassium 3.6; Sodium 139 09/28/2015: Hemoglobin 10.3*; Platelets 267.0    Lipid Panel    Component Value Date/Time   CHOL 95 09/18/2015 0408   TRIG 81 09/18/2015 0408   HDL 29* 09/18/2015 0408   CHOLHDL 3.3 09/18/2015 0408   VLDL 16 09/18/2015 0408   LDLCALC 50 09/18/2015 0408      Wt Readings from Last 3 Encounters:  10/05/15 171  lb 6.4 oz (77.747 kg)  09/29/15 173 lb (78.472 kg)  09/28/15 169 lb (76.658 kg)      Other studies Reviewed: Additional studies/ records that were reviewed today include and review of the records demonstrates:   Cardiac catheterization 09/18/2015 Conclusion       Prox LAD lesion, 60% stenosed.  2nd Diag lesion, 95% stenosed.  The left ventricular systolic function is normal.  Mid LAD to Dist LAD lesion, 20% stenosed.   Normal LV function without residual wall motion abnormality with an ejection fraction of 55-65%.  Two-vessel CAD with 60% eccentric smooth tubular ostial LAD stenosis, 20% mid stenosis in the region of a very small bifurcating diagonal vessel with 95% stenosis in the inferior limb of the small diagonal branch; widely patent tandem bare-metal stents in the AV groove circumflex vessel; normal RCA.  RECOMMENDATION: Increased medical therapy.  Nitrates will be added to her regimen of beta blocker, ACE inhibitor and statin.  Consider amlodipine if recurrent symptomatology.         ASSESSMENT AND PLAN:  CAD  S/P CFX BMS (? med complinace) x 2 06/16/15 Patient is stable from a cardiac standpoint. Repeat cardiac catheterization 09/18/15 showed a widely patent stent in the AV groove circumflex vessel, normal RCA, 60% proximal LAD, 95% diagonal 2 is normal LV function. Medical therapy was recommended. Imdur was added. She has had no further chest pain since then. She is off Brilinta because of GI and vaginal bleeding. She is having a repeat endoscopy on Friday to remove the polyp. Decision will be made then whether or not she can go back on Brilinta. She still has no visit for the vaginal bleeding. Early f/u with Dr. Irish Lack in 1 month.  Transient right leg weakness Patient complains of right leg weakness, loss of strength and numbness that occurs about 3 days a week ever since her cardiac catheterization. She is walking with a walker because of this. She has no pain. There is no evidence of swelling at cath site or hematoma or hemorrhage. Recommend she try physical therapy. If this does not work she may need to see a neurologist.  Acute GI bleeding Patient had the polyp clipped but is having a repeat endoscopy on Friday for possible removal of the polyp. She has been off Brilinta for 2 weeks. Decision to be made after the endoscopy whether or not she can go back on Brilinta.    Sumner Boast, PA-C  10/05/2015 1:06 PM    [facility] [address], [place], [place]  [address] Phone: [phone]; Fax: [phone]

## 2015-10-05 NOTE — Patient Instructions (Addendum)
Medication Instructions:  Your physician recommends that you continue on your current medications as directed. Please refer to the Current Medication list given to you today.  Labwork: NONE  Testing/Procedures: NONE  Follow-Up: Your physician recommends that you schedule a follow-up appointment in: 1 month with Dr. Irish Lack.  You have been referred to Physical Therapy.    If you need a refill on your cardiac medications before your next appointment, please call your pharmacy.

## 2015-10-05 NOTE — Telephone Encounter (Signed)
Patient called and left message stating she needs a refill on her megace sent to [facility]. Called patient and informed her of refill sent to pharmacy. Patient verbalized understanding and had no questions. Per chart review, patient needs f/u appt. Will send to admin pool

## 2015-10-09 ENCOUNTER — Ambulatory Visit (AMBULATORY_SURGERY_CENTER): Payer: Self-pay | Admitting: Gastroenterology

## 2015-10-09 ENCOUNTER — Encounter: Payer: Self-pay | Admitting: Gastroenterology

## 2015-10-09 VITALS — BP 140/82 | HR 74 | Temp 98.9°F | Resp 46 | Ht 66.0 in | Wt 169.0 lb

## 2015-10-09 DIAGNOSIS — K317 Polyp of stomach and duodenum: Secondary | ICD-10-CM

## 2015-10-09 HISTORY — PX: OTHER SURGICAL HISTORY: SHX169

## 2015-10-09 LAB — GLUCOSE, CAPILLARY
GLUCOSE-CAPILLARY: 58 mg/dL — AB (ref 65–99)
Glucose-Capillary: 72 mg/dL (ref 65–99)
Glucose-Capillary: 63 mg/dL — ABNORMAL LOW (ref 65–99)
Glucose-Capillary: 132 mg/dL — ABNORMAL HIGH (ref 65–99)

## 2015-10-09 MED ORDER — SODIUM CHLORIDE 0.9 % IV SOLN: 500.0000 mL | INTRAVENOUS | Status: DC

## 2015-10-09 NOTE — Progress Notes (Signed)
Called to room to assist during endoscopic procedure.  Patient ID and intended procedure confirmed with present staff. Received instructions for my participation in the procedure from the performing physician.  

## 2015-10-09 NOTE — Progress Notes (Signed)
1141 Esmolol 10 mg

## 2015-10-09 NOTE — Progress Notes (Signed)
A/ox3, pleased with MAC, VSS and return to pre-procedure status, SR, BP WNL, report to [employer]

## 2015-10-09 NOTE — Progress Notes (Signed)
Clip card provided with clip location (gastric body).

## 2015-10-09 NOTE — Patient Instructions (Signed)
YOU HAD AN ENDOSCOPIC PROCEDURE TODAY AT [facility]:   Refer to the procedure report that was given to you for any specific questions about what was found during the examination.  If the procedure report does not answer your questions, please call your gastroenterologist to clarify.  If you requested that your care partner not be given the details of your procedure findings, then the procedure report has been included in a sealed envelope for you to review at your convenience later.  YOU SHOULD EXPECT: Some feelings of bloating in the abdomen. Passage of more gas than usual.  Walking can help get rid of the air that was put into your GI tract during the procedure and reduce the bloating. If you had a lower endoscopy (such as a colonoscopy or flexible sigmoidoscopy) you may notice spotting of blood in your stool or on the toilet paper. If you underwent a bowel prep for your procedure, you may not have a normal bowel movement for a few days.  Please Note:  You might notice some irritation and congestion in your nose or some drainage.  This is from the oxygen used during your procedure.  There is no need for concern and it should clear up in a day or so.  SYMPTOMS TO REPORT IMMEDIATELY:    Following upper endoscopy (EGD)  Vomiting of blood or coffee ground material  New chest pain or pain under the shoulder blades  Painful or persistently difficult swallowing  New shortness of breath  Fever of 100F or higher  Black, tarry-looking stools  For urgent or emergent issues, a gastroenterologist can be reached at any hour by calling [phone].   DIET: Your first meal following the procedure should be a small meal and then it is ok to progress to your normal diet. Heavy or fried foods are harder to digest and may make you feel nauseous or bloated.  Likewise, meals heavy in dairy and vegetables can increase bloating.  Drink plenty of fluids but you should avoid alcoholic beverages  for 24 hours.  ACTIVITY:  You should plan to take it easy for the rest of today and you should NOT DRIVE or use heavy machinery until tomorrow (because of the sedation medicines used during the test).    FOLLOW UP: Our staff will call the number listed on your records the next business day following your procedure to check on you and address any questions or concerns that you may have regarding the information given to you following your procedure. If we do not reach you, we will leave a message.  However, if you are feeling well and you are not experiencing any problems, there is no need to return our call.  We will assume that you have returned to your regular daily activities without incident.  If any biopsies were taken you will be contacted by phone or by letter within the next 1-3 weeks.  Please call us at [phone] if you have not heard about the biopsies in 3 weeks.    SIGNATURES/CONFIDENTIALITY: You and/or your care partner have signed paperwork which will be entered into your electronic medical record.  These signatures attest to the fact that that the information above on your After Visit Summary has been reviewed and is understood.  Full responsibility of the confidentiality of this discharge information lies with you and/or your care-partner.  Check with your cardiologist and gynocologist about resuming Brilenta.    Await pathology results.

## 2015-10-09 NOTE — Op Note (Signed)
[facility]  [address] [place], [address]   ENDOSCOPY PROCEDURE REPORT  PATIENT: Virginia [name], Virginia [name]  MR#: [id number] BIRTHDATE: Jan 30, 1961 , 30  yrs. old GENDER: female ENDOSCOPIST: Harl Bowie, MD REFERRED BY:  [name], M.D. PROCEDURE DATE:  10/09/2015 PROCEDURE:  EGD w/ snare polypectomy ASA CLASS:     Class II INDICATIONS:  Bleeding pedunculated gastric polyp that was clipped for hemostasis here for polypectomy. MEDICATIONS: Propofol 300 mg IV and Per Anesthesia TOPICAL ANESTHETIC: none  DESCRIPTION OF PROCEDURE: After the risks benefits and alternatives of the procedure were thoroughly explained, informed consent was obtained.  The LB [id number] endoscope was introduced through the mouth and advanced to the second portion of the duodenum , Without limitations.  The instrument was slowly withdrawn as the mucosa was fully examined.    Esophagus appeared normal with regular Z line.  Noted large gastric pedunculated polyp in the fundus with 3 clips at the base.  snare polypectomy performed with cautery in one piece, as mentioned retrieved with Jabier Mutton net.  A hemostatic clip was placed in the pedicle at the site of polypectomy.  Retroflexed views revealed as previously described.     The scope was then withdrawn from the patient and the procedure completed.  COMPLICATIONS: There were no immediate complications.  ENDOSCOPIC IMPRESSION: Large Pendunculated gastric polyp removed  RECOMMENDATIONS: 1.  Ok to restart Brilinta from GI perspective, please contact Cardiology and Gynaecology to clarify their recommendation given dysfunctional uterine bleeding and has an appointment with Gyn next week 2.  Await pathology results     eSigned:  Harl Bowie, MD 10/09/2015 11:59 AM

## 2015-10-09 NOTE — Progress Notes (Signed)
1139 Esmolol 10 mg

## 2015-10-09 NOTE — Progress Notes (Signed)
1134 Esmolol 10 mg IV 1137 Esmolol 10 mg IV

## 2015-10-12 ENCOUNTER — Telehealth: Payer: Self-pay | Admitting: Emergency Medicine

## 2015-10-12 NOTE — Telephone Encounter (Signed)
  Follow up Call-  Call back number 10/09/2015  Post procedure Call Back phone  # [phone]  Permission to leave phone message Yes     Patient questions:  Do you have a fever, pain , or abdominal swelling? No. Pain Score  0 *  Have you tolerated food without any problems? Yes.    Have you been able to return to your normal activities? Yes.    Do you have any questions about your discharge instructions: Diet   No. Medications  No. Follow up visit  No.  Do you have questions or concerns about your Care? No.  Actions: * If pain score is 4 or above: No action needed, pain <4.

## 2015-10-15 ENCOUNTER — Encounter: Payer: Self-pay | Admitting: Gastroenterology

## 2015-10-16 ENCOUNTER — Ambulatory Visit (INDEPENDENT_AMBULATORY_CARE_PROVIDER_SITE_OTHER): Payer: Self-pay | Admitting: Obstetrics & Gynecology

## 2015-10-16 ENCOUNTER — Other Ambulatory Visit (HOSPITAL_COMMUNITY)
Admission: RE | Admit: 2015-10-16 | Discharge: 2015-10-16 | Disposition: A | Discharge status: Home / Self Care | Payer: Medicaid Other | Source: Ambulatory Visit | Attending: Obstetrics & Gynecology | Admitting: Obstetrics & Gynecology

## 2015-10-16 ENCOUNTER — Encounter: Payer: Self-pay | Admitting: Obstetrics & Gynecology

## 2015-10-16 VITALS — BP 116/66 | HR 71 | Temp 98.5°F | Wt 169.8 lb

## 2015-10-16 DIAGNOSIS — R87619 Unspecified abnormal cytological findings in specimens from cervix uteri: Secondary | ICD-10-CM | POA: Diagnosis present

## 2015-10-16 DIAGNOSIS — Z1151 Encounter for screening for human papillomavirus (HPV): Secondary | ICD-10-CM

## 2015-10-16 DIAGNOSIS — Z124 Encounter for screening for malignant neoplasm of cervix: Secondary | ICD-10-CM

## 2015-10-16 DIAGNOSIS — N938 Other specified abnormal uterine and vaginal bleeding: Secondary | ICD-10-CM

## 2015-10-16 MED ORDER — MEGESTROL ACETATE 40 MG PO TABS: 80.0000 mg | ORAL_TABLET | Freq: Two times a day (BID) | ORAL | Status: DC

## 2015-10-16 NOTE — Progress Notes (Signed)
Patient ID: Virginia [name], female   DOB: 26-Jul-1961, 54 y.o.   MRN: [id number]  Chief Complaint  Patient presents with  . Vaginal Bleeding   Hospitalized with anemia and recurrent bleeding 11/14 and Dr Nehemiah Settle was consulted HPI Virginia [name] is a 54 y.o. female.  [id number] Patient's last menstrual period was 07/20/2015. LMP 11/12, heavy and required Megace po to stop. No bleeding now. [place] was repeated during hospitalization.  HPI  Past Medical History  Diagnosis Date  . Hyperlipidemia with target LDL less than 70   . STEMI (ST elevation myocardial infarction) ([place]) 06/16/2015    BMS x 2 CFX  . Diabetes mellitus without complication ([place])   . Hypertension   . CHF (congestive heart failure) ([place])   . CAD (coronary artery disease)     a. 06/2015 BMS x2 to LCx. b. recurrent CP, cath 09/18/2015 patent stent, tiny diag stenosis medical therapy.   Marland Kitchen GERD (gastroesophageal reflux disease)     ?  Marland Kitchen Anemia     Past Surgical History  Procedure Laterality Date  . Appendectomy    . Cesarean section    . Cardiac catheterization N/A 06/17/2015    Procedure: Left Heart Cath and Coronary Angiography;  Surgeon: Jettie Booze, MD;  Location: [place] [facility];  Service: Cardiovascular;  Laterality: N/A;  . Cardiac catheterization  06/17/2015    Procedure: Coronary Stent Intervention;  Surgeon: Jettie Booze, MD;  Location: [place] [facility];  Service: Cardiovascular;;  . Cardiac catheterization N/A 09/18/2015    Procedure: Left Heart Cath and Coronary Angiography;  Surgeon: Troy Sine, MD;  Location: [place] [facility];  Service: Cardiovascular;  Laterality: N/A;  . Esophagogastroduodenoscopy N/A 09/20/2015    Procedure: ESOPHAGOGASTRODUODENOSCOPY (EGD);  Surgeon: Carol Ada, MD;  Location: [facility];  Service: Endoscopy;  Laterality: N/A;  . Colonoscopy N/A 09/20/2015    Procedure: COLONOSCOPY;  Surgeon: Carol Ada, MD;  Location: [place];  Service: Endoscopy;   Laterality: N/A;  . Colonoscopy      Family History  Problem Relation Age of Onset  . Stroke Mother   . Heart disease Mother   . Diabetes Mother   . Cancer Father   . Hypertension Sister   . Hyperlipidemia Sister   . Heart attack Brother   . Hypertension Brother   . Heart attack Maternal Uncle   . Asthma Paternal Uncle   . Diabetes Paternal Uncle   . Thyroid disease Neg Hx   . Diabetes Paternal Aunt     Social History Social History  Substance Use Topics  . Smoking status: Never Smoker   . Smokeless tobacco: Never Used  . Alcohol Use: No    No Known Allergies  Current Outpatient Prescriptions  Medication Sig Dispense Refill  . aspirin EC 81 MG tablet Take 81 mg by mouth daily.    Marland Kitchen atorvastatin (LIPITOR) 80 MG tablet Take 1 tablet (80 mg total) by mouth daily at 6 PM. 90 tablet 3  . ferrous sulfate 325 (65 FE) MG tablet Take 1 tablet (325 mg total) by mouth 2 (two) times daily with a meal. 60 tablet 0  . glimepiride (AMARYL) 2 MG tablet Take 1 tablet (2 mg total) by mouth daily before breakfast. 30 tablet 3  . isosorbide mononitrate (IMDUR) 30 MG 24 hr tablet Take 0.5 tablets (15 mg total) by mouth daily. 30 tablet 5  . lisinopril (PRINIVIL,ZESTRIL) 2.5 MG tablet Take 1 tablet (2.5 mg total) by mouth daily.  90 tablet 3  . megestrol (MEGACE) 40 MG tablet Take 2 tablets (80 mg total) by mouth 2 (two) times daily. 60 tablet 1  . metoprolol tartrate (LOPRESSOR) 25 MG tablet Take 1 tablet (25 mg total) by mouth 2 (two) times daily. 180 tablet 3  . nitroGLYCERIN (NITROSTAT) 0.4 MG SL tablet Place 1 tablet (0.4 mg total) under the tongue every 5 (five) minutes as needed for chest pain. 25 tablet 2  . pantoprazole (PROTONIX) 40 MG tablet Take 1 tablet (40 mg total) by mouth daily. 30 tablet 0  . ticagrelor (BRILINTA) 90 MG TABS tablet Take 1 tablet (90 mg total) by mouth 2 (two) times daily. (Patient not taking: Reported on 10/05/2015) 180 tablet 3   No current  facility-administered medications for this visit.    Review of Systems Review of Systems  Constitutional: Negative.   Respiratory: Negative.   Cardiovascular: Negative.   Genitourinary: Positive for menstrual problem. Negative for vaginal bleeding and pelvic pain.    Blood pressure 116/66, pulse 71, temperature 98.5 F (36.9 C), temperature source Oral, weight 169 lb 12.8 oz (77.021 kg), last menstrual period 07/20/2015.  Physical Exam Physical Exam  Constitutional: She appears well-developed. No distress.  Genitourinary: No vaginal discharge found.  Uterus 12-14 week size no adnexal mass  Patient given informed consent, signed copy in the chart, time out was performed. Appropriate time out taken. . The patient was placed in the lithotomy position and the cervix brought into view with sterile speculum.  Portio of cervix cleansed x 2 with betadine swabs.  The uterus was sounded for depth of 12 cm. A pipelle was introduced to into the uterus, suction created,  and an endometrial sample was obtained. All equipment was removed and accounted for.  The patient tolerated the procedure well.    Patient given post procedure instructions. The patient will return in 4 weeks for results.  Skin: No pallor.    Data Reviewed CBC    Component Value Date/Time   WBC 6.0 09/28/2015 0931   RBC 3.49* 09/28/2015 0931   RBC 3.10* 09/19/2015 1244   HGB 10.3* 09/28/2015 0931   HCT 31.4* 09/28/2015 0931   PLT 267.0 09/28/2015 0931   MCV 89.8 09/28/2015 0931   MCH 28.8 09/22/2015 0429   MCHC 32.8 09/28/2015 0931   RDW 16.6* 09/28/2015 0931   LYMPHSABS 2.0 09/28/2015 0931   MONOABS 0.3 09/28/2015 0931   EOSABS 0.4 09/28/2015 0931   BASOSABS 0.0 09/28/2015 0931   CLINICAL DATA: Dysfunctional uterine bleeding.  EXAM: TRANSABDOMINAL AND TRANSVAGINAL ULTRASOUND OF PELVIS  TECHNIQUE: Both transabdominal and transvaginal ultrasound examinations of the pelvis were performed. Transabdominal  technique was performed for global imaging of the pelvis including uterus, ovaries, adnexal regions, and pelvic cul-de-sac. It was necessary to proceed with endovaginal exam following the transabdominal exam to visualize the uterus and right adnexa to better advantage.  COMPARISON: CT, 06/29/2015  FINDINGS: Uterus  Measurements: 14.2 x 7.5 x 6.4 cm. Multiple uterine masses consistent fibroids. Largest arises from the posterior right uterine fundus measuring 3.5 x 2.9 x 2.9 cm. Echogenic material within this is consistent with calcifications, which were present on the prior CT. There are at least 3 additional smaller fibroids.  Endometrium  Not visualized.  Right ovary  Not visualized. No right adnexal mass.  Left ovary  Not visualized. No left adnexal mass.  Other findings  No free fluid.  IMPRESSION: 1. Enlarged uterus with multiple fibroids. 2. Endometrium not visualized. Neither ovary visualized.  No adnexal masses.   Electronically Signed  By: Lajean Manes M.D.  On: 09/19/2015 13:50    Assessment    PMP bleeding with fibroid uterus stable since 2011, h/o myomectomy Patient Active Problem List   Diagnosis Date Noted  . Transient right leg weakness 10/05/2015  . Acute GI bleeding   . Symptomatic anemia 09/19/2015  . Vasovagal near syncope 09/19/2015  . Dysfunctional uterine bleeding 09/19/2015  . Diarrhea due to drug 09/19/2015  . Heme positive stool 09/19/2015  . Chest pain 09/17/2015  . Multinodular goiter 07/09/2015  . CAD S/P CFX BMS (? med complinace) x 2 06/16/15 07/01/2015  . Acute renal insufficiency 07/01/2015  . Abdominal pain 07/01/2015  . Nodular goiter 06/23/2015  . Diabetes mellitus type 2, controlled ([place]) 06/23/2015  . STEMI -06/16/15 06/17/2015  . Hyperlipidemia with target LDL less than 70         Plan    Continue megace for now Will consider long term progestin therapy vs surgical management, RTC to review  biopsy result        Vonne Mcdanel 10/16/2015, 11:09 AM

## 2015-10-16 NOTE — Patient Instructions (Signed)
Uterine Fibroids Uterine fibroids are tissue masses (tumors) that can develop in the womb (uterus). They are also called leiomyomas. This type of tumor is not cancerous (benign) and does not spread to other parts of the body outside of the pelvic area, which is between the hip bones. Occasionally, fibroids may develop in the fallopian tubes, in the cervix, or on the support structures (ligaments) that surround the uterus. You can have one or many fibroids. Fibroids can vary in size, weight, and where they grow in the uterus. Some can become quite large. Most fibroids do not require medical treatment. CAUSES A fibroid can develop when a single uterine cell keeps growing (replicating). Most cells in the human body have a control mechanism that keeps them from replicating without control. SIGNS AND SYMPTOMS Symptoms may include:   Heavy bleeding during your period.  Bleeding or spotting between periods.  Pelvic pain and pressure.  Bladder problems, such as needing to urinate more often (urinary frequency) or urgently.  Inability to reproduce offspring (infertility).  Miscarriages. DIAGNOSIS Uterine fibroids are diagnosed through a physical exam. Your health care provider may feel the lumpy tumors during a pelvic exam. Ultrasonography and an MRI may be done to determine the size, location, and number of fibroids. TREATMENT Treatment may include:  Watchful waiting. This involves getting the fibroid checked by your health care provider to see if it grows or shrinks. Follow your health care provider's recommendations for how often to have this checked.  Hormone medicines. These can be taken by mouth or given through an intrauterine device (IUD).  Surgery.  Removing the fibroids (myomectomy) or the uterus (hysterectomy).  Removing blood supply to the fibroids (uterine artery embolization). If fibroids interfere with your fertility and you want to become pregnant, your health care provider  may recommend having the fibroids removed.  HOME CARE INSTRUCTIONS  Keep all follow-up visits as directed by your health care provider. This is important.  Take medicines only as directed by your health care provider.  If you were prescribed a hormone treatment, take the hormone medicines exactly as directed.  Do not take aspirin, because it can cause bleeding.  Ask your health care provider about taking iron pills and increasing the amount of dark green, leafy vegetables in your diet. These actions can help to boost your blood iron levels, which may be affected by heavy menstrual bleeding.  Pay close attention to your period and tell your health care provider about any changes, such as:  Increased blood flow that requires you to use more pads or tampons than usual per month.  A change in the number of days that your period lasts per month.  A change in symptoms that are associated with your period, such as abdominal cramping or back pain. SEEK MEDICAL CARE IF:  You have pelvic pain, back pain, or abdominal cramps that cannot be controlled with medicines.  You have an increase in bleeding between and during periods.  You soak tampons or pads in a half hour or less.  You feel lightheaded, extra tired, or weak. SEEK IMMEDIATE MEDICAL CARE IF:  You faint.  You have a sudden increase in pelvic pain.   This information is not intended to replace advice given to you by your health care provider. Make sure you discuss any questions you have with your health care provider.   Document Released: 10/21/2000 Document Revised: 11/14/2014 Document Reviewed: 04/22/2014 Elsevier Interactive Patient Education 2016 [employer]  

## 2015-10-20 ENCOUNTER — Encounter: Payer: Self-pay | Admitting: Gastroenterology

## 2015-10-20 LAB — CYTOLOGY - PAP

## 2015-10-22 ENCOUNTER — Telehealth: Payer: Self-pay | Admitting: Cardiology

## 2015-10-22 NOTE — Telephone Encounter (Signed)
New Message  Pt calling to speak w/ RN - pt wants to know if she can start back on Brilenta. Pt wanted to also discuss the meds she was on for her procedure in Nov2016. Please call back and discuss.

## 2015-10-23 NOTE — Telephone Encounter (Signed)
Spoke with pt and advised her the Luna Kitchens was stopped d/t her GI procedures and uterine bleeding.  She would need to verify with those MDs to see if she can restart it/if the cause of those bleeding issues have been taken care of.  Pt reports she was told it was OK to restart by those doctors.  Reviewed her medications, what they are for and how many refills she has on them.  Advised her to have her pharmacy contact us if they don't have refills on file.  She states understanding and thanked me for my time.

## 2015-10-29 LAB — HM DIABETES EYE EXAM

## 2015-11-13 ENCOUNTER — Ambulatory Visit (INDEPENDENT_AMBULATORY_CARE_PROVIDER_SITE_OTHER): Payer: No Typology Code available for payment source | Admitting: Cardiology

## 2015-11-13 ENCOUNTER — Ambulatory Visit (INDEPENDENT_AMBULATORY_CARE_PROVIDER_SITE_OTHER): Payer: Self-pay | Admitting: Obstetrics & Gynecology

## 2015-11-13 ENCOUNTER — Encounter: Payer: Self-pay | Admitting: Cardiology

## 2015-11-13 ENCOUNTER — Encounter: Payer: Self-pay | Admitting: Obstetrics & Gynecology

## 2015-11-13 VITALS — BP 124/73 | HR 82 | Temp 98.2°F | Ht 66.0 in | Wt 169.6 lb

## 2015-11-13 VITALS — BP 118/78 | HR 82 | Ht 66.0 in | Wt 170.1 lb

## 2015-11-13 DIAGNOSIS — N938 Other specified abnormal uterine and vaginal bleeding: Secondary | ICD-10-CM

## 2015-11-13 DIAGNOSIS — I2111 ST elevation (STEMI) myocardial infarction involving right coronary artery: Secondary | ICD-10-CM

## 2015-11-13 DIAGNOSIS — I2510 Atherosclerotic heart disease of native coronary artery without angina pectoris: Secondary | ICD-10-CM

## 2015-11-13 DIAGNOSIS — D259 Leiomyoma of uterus, unspecified: Secondary | ICD-10-CM

## 2015-11-13 DIAGNOSIS — Z9861 Coronary angioplasty status: Secondary | ICD-10-CM

## 2015-11-13 DIAGNOSIS — R319 Hematuria, unspecified: Secondary | ICD-10-CM

## 2015-11-13 DIAGNOSIS — Z01810 Encounter for preprocedural cardiovascular examination: Secondary | ICD-10-CM

## 2015-11-13 LAB — BASIC METABOLIC PANEL
BUN: 11 mg/dL (ref 7–25)
CHLORIDE: 106 mmol/L (ref 98–110)
CO2: 21 mmol/L (ref 20–31)
Calcium: 9.4 mg/dL (ref 8.6–10.4)
Creat: 0.97 mg/dL (ref 0.50–1.05)
GLUCOSE: 160 mg/dL — AB (ref 65–99)
POTASSIUM: 3.6 mmol/L (ref 3.5–5.3)
Sodium: 136 mmol/L (ref 135–146)

## 2015-11-13 LAB — POCT URINALYSIS DIP (DEVICE)
Bilirubin Urine: NEGATIVE
GLUCOSE, UA: NEGATIVE mg/dL
LEUKOCYTES UA: NEGATIVE
NITRITE: NEGATIVE
Protein, ur: NEGATIVE mg/dL
SPECIFIC GRAVITY, URINE: 1.02 (ref 1.005–1.030)
UROBILINOGEN UA: 0.2 mg/dL (ref 0.0–1.0)
pH: 5.5 (ref 5.0–8.0)

## 2015-11-13 LAB — CBC
HCT: 36.1 % (ref 36.0–46.0)
Hemoglobin: 12.1 g/dL (ref 12.0–15.0)
MCH: 30.3 pg (ref 26.0–34.0)
MCHC: 33.5 g/dL (ref 30.0–36.0)
MCV: 90.5 fL (ref 78.0–100.0)
MPV: 9.9 fL (ref 8.6–12.4)
PLATELETS: 298 10*3/uL (ref 150–400)
RBC: 3.99 MIL/uL (ref 3.87–5.11)
RDW: 16.1 % — AB (ref 11.5–15.5)
WBC: 8.8 10*3/uL (ref 4.0–10.5)

## 2015-11-13 NOTE — Progress Notes (Signed)
Here for follow up of postmenopausal bleeding.States spotted for a week after last visit. 10/14/15, then had a cycle ( bleeding ) lasted until 11/10/15. Then spotting and cramping since then . Reports blood in urine x 2 weeks, not sure if it is from her cycle. Denies burning or pain with urination.  Will do urinalysis.

## 2015-11-13 NOTE — Patient Instructions (Signed)
Abdominal Hysterectomy  Abdominal hysterectomy is a surgical procedure to remove your womb (uterus). Your uterus is the muscular organ that contains a developing baby. This surgery is done for many reasons. You may need an abdominal hysterectomy if you have cancer, growths (tumors), long-term pain, or bleeding. You may also have this procedure if your uterus has slipped down into your vagina (uterine prolapse).  Depending on why you need an abdominal hysterectomy, you may also have other reproductive organs removed. These could include the part of your vagina that connects with your uterus (cervix), the organs that make eggs (ovaries), and the tubes that connect the ovaries to the uterus (fallopian tubes).  LET YOUR HEALTH CARE PROVIDER KNOW ABOUT:    Any allergies you have.   All medicines you are taking, including vitamins, herbs, eye drops, creams, and over-the-counter medicines.   Previous problems you or members of your family have had with the use of anesthetics.   Any blood disorders you have.   Previous surgeries you have had.   Medical conditions you have.  RISKS AND COMPLICATIONS  Generally, this is a safe procedure. However, as with any procedure, problems can occur. Infection is the most common problem after an abdominal hysterectomy. Other possible problems include:   Bleeding.   Formation of blood clots that may break free and travel to your lungs.   Injury to other organs near your uterus.   Nerve injury causing nerve pain.   Decreased interest in sex or pain during sexual intercourse.  BEFORE THE PROCEDURE   Abdominal hysterectomy is a major surgical procedure. It can affect the way you feel about yourself. Talk to your health care provider about the physical and emotional changes hysterectomy may cause.   You may need to have blood work and X-rays done before surgery.   Quit smoking if you smoke. Ask your health care provider for help if you are struggling to quit.   Stop taking  medicines that thin your blood as directed by your health care provider.   You may be instructed to take antibiotic medicines or laxatives before surgery.   Do not eat or drink anything for 6-8 hours before surgery.   Take your regular medicines with a small sip of water.   Bathe or shower the night or morning before surgery.  PROCEDURE   Abdominal hysterectomy is done in the operating room at the hospital.   In most cases, you will be given a medicine that makes you go to sleep (general anesthetic).   The surgeon will make a cut (incision) through the skin in your lower belly.   The incision may be about 5-7 inches long. It may go side-to-side or up-and-down.   The surgeon will move aside the body tissue that covers your uterus. The surgeon will then carefully take out your uterus along with any of your other reproductive organs that need to be removed.   Bleeding will be controlled with clamps or sutures.   The surgeon will close your incision with sutures or metal clips.  AFTER THE PROCEDURE   You will have some pain immediately after the procedure.   You will be given pain medicine in the recovery room.   You will be taken to your hospital room when you have recovered from the anesthesia.   You may need to stay in the hospital for 2-5 days.   You will be given instructions for recovery at home.     This information is not intended 

## 2015-11-13 NOTE — Patient Instructions (Addendum)
Medication Instructions:  Please stop your Brilinta. Continue all other medications as listed.  Labwork: Please have blood work today (CBC, BMP).  You have been cleared to have surgery.  Follow-Up: Follow up in 2 to 3 months with Dr Irish Lack.  If you need a refill on your cardiac medications before your next appointment, please call your pharmacy.  Thank you for choosing [facility]!!

## 2015-11-13 NOTE — Progress Notes (Addendum)
Cardiology Office Note   Date:  11/13/2015   ID:  Virginia [name], DOB September 22, 1961, MRN [id number]  PCP:  [name], MD  Cardiologist:  Dr. Irish Lack  Chief Complaint: Right leg weakness    History of Present Illness: Virginia [name] is a 55 y.o. female with history of CAD with lateral STEMI 06/2015 treated with bare-metal stent 2 (edge dissection) to the circumflex. She had recurrent chest pain and underwent cardiac cath 09/18/15 that showed patent stents with tiny diagonal 90% stenosis treated medically.   She's been having dysfunctional uterine bleeding secondary to multiple fibroids. At one point in the hospital she was discontinued on her Brilinta and she still had fairly significant bleeding.   About a week ago, she was restarted on her Brilinta.  She was seen today also by OB/GYN who assessed her for potential hysterectomy. They're waiting preoperative cardiac risk stratification.   She was readmitted 09/19/15 with a GI bleed. EGD showed bleeding polyp which was hemoclipped which arrested the bleeding but the polyp was not resected because she was on Brilinta, colonoscopy unremarkable. Because of her bleeding Brilinta was stopped for a few weeks. She was continued on aspirin, beta blocker, ACE inhibitor, nitrates and statins. She also had mild acute renal failure due to anemia resolved with hydration, dyslipidemia and diabetes mellitus type 2.   Patient isn't having any chest pain or shortness of breath.  Past Medical History  Diagnosis Date  . Hyperlipidemia with target LDL less than 70   . STEMI (ST elevation myocardial infarction) ([place]) 06/16/2015    BMS x 2 CFX  . Diabetes mellitus without complication ([place])   . Hypertension   . CHF (congestive heart failure) ([place])   . CAD (coronary artery disease)     a. 06/2015 BMS x2 to LCx. b. recurrent CP, cath 09/18/2015 patent stent, tiny diag stenosis medical therapy.   Marland Kitchen GERD (gastroesophageal reflux disease)     ?  Marland Kitchen  Anemia     Past Surgical History  Procedure Laterality Date  . Appendectomy    . Cesarean section    . Cardiac catheterization N/A 06/17/2015    Procedure: Left Heart Cath and Coronary Angiography;  Surgeon: Jettie Booze, MD;  Location: [facility];  Service: Cardiovascular;  Laterality: N/A;  . Cardiac catheterization  06/17/2015    Procedure: Coronary Stent Intervention;  Surgeon: Jettie Booze, MD;  Location: [facility];  Service: Cardiovascular;;  . Cardiac catheterization N/A 09/18/2015    Procedure: Left Heart Cath and Coronary Angiography;  Surgeon: Troy Sine, MD;  Location: [facility];  Service: Cardiovascular;  Laterality: N/A;  . Esophagogastroduodenoscopy N/A 09/20/2015    Procedure: ESOPHAGOGASTRODUODENOSCOPY (EGD);  Surgeon: Carol Ada, MD;  Location: [facility];  Service: Endoscopy;  Laterality: N/A;  . Colonoscopy N/A 09/20/2015    Procedure: COLONOSCOPY;  Surgeon: Carol Ada, MD;  Location: [facility];  Service: Endoscopy;  Laterality: N/A;  . Colonoscopy       Current Outpatient Prescriptions  Medication Sig Dispense Refill  . aspirin EC 81 MG tablet Take 81 mg by mouth daily.    Marland Kitchen atorvastatin (LIPITOR) 80 MG tablet Take 1 tablet (80 mg total) by mouth daily at 6 PM. 90 tablet 3  . glimepiride (AMARYL) 2 MG tablet Take 1 tablet (2 mg total) by mouth daily before breakfast. 30 tablet 3  . isosorbide mononitrate (IMDUR) 30 MG 24 hr tablet Take 0.5 tablets (15 mg total) by mouth  daily. 30 tablet 5  . lisinopril (PRINIVIL,ZESTRIL) 2.5 MG tablet Take 1 tablet (2.5 mg total) by mouth daily. 90 tablet 3  . megestrol (MEGACE) 40 MG tablet Take 2 tablets (80 mg total) by mouth 2 (two) times daily. 60 tablet 1  . metoprolol tartrate (LOPRESSOR) 25 MG tablet Take 1 tablet (25 mg total) by mouth 2 (two) times daily. 180 tablet 3  . nitroGLYCERIN (NITROSTAT) 0.4 MG SL tablet Place 1 tablet (0.4 mg total) under the tongue every 5 (five)  minutes as needed for chest pain. 25 tablet 2  . pantoprazole (PROTONIX) 40 MG tablet Take 1 tablet (40 mg total) by mouth daily. 30 tablet 0  . ticagrelor (BRILINTA) 90 MG TABS tablet Take 1 tablet (90 mg total) by mouth 2 (two) times daily. 180 tablet 3   No current facility-administered medications for this visit.    Allergies:   Review of patient's allergies indicates no known allergies.    Social History:  The patient  reports that she has never smoked. She has never used smokeless tobacco. She reports that she does not drink alcohol or use illicit drugs.   Family History:  The patient's   family history includes Asthma in her paternal uncle; Cancer in her father; Diabetes in her mother, paternal aunt, and paternal uncle; Heart attack in her brother and maternal uncle; Heart disease in her mother; Hyperlipidemia in her sister; Hypertension in her brother and sister; Stroke in her mother. There is no history of Thyroid disease.    ROS:  Please see the history of present illness.    All other systems are reviewed and negative.    PHYSICAL EXAM: VS:  BP 118/78 mmHg  Pulse 82  Ht 5\' 6"  (1.676 m)  Wt 170 lb 1.9 oz (77.166 kg)  BMI 27.47 kg/m2  LMP 10/21/2015 , BMI Body mass index is 27.47 kg/(m^2). GEN: Well nourished, well developed, in no acute distress Neck: no JVD, HJR, carotid bruits, or masses Cardiac:  RRR; no murmurs,gallop, rubs, thrill or heave,  Respiratory:  clear to auscultation bilaterally, normal work of breathing GI: soft, nontender, nondistended, + BS MS: no deformity or atrophy Extremities:  without cyanosis, clubbing, edema, good distal pulses bilaterally.  Skin: warm and dry, no rash, pale appearance Neuro:  Strength and sensation are intact    EKG:  None today  Recent Labs: 06/17/2015: TSH 0.963 09/17/2015: ALT 14; B Natriuretic Peptide 62.9 09/22/2015: BUN <5*; Creatinine, Ser 0.92; Potassium 3.6; Sodium 139 09/28/2015: Hemoglobin 10.3*; Platelets 267.0     Lipid Panel    Component Value Date/Time   CHOL 95 09/18/2015 0408   TRIG 81 09/18/2015 0408   HDL 29* 09/18/2015 0408   CHOLHDL 3.3 09/18/2015 0408   VLDL 16 09/18/2015 0408   LDLCALC 50 09/18/2015 0408      Wt Readings from Last 3 Encounters:  11/13/15 170 lb 1.9 oz (77.166 kg)  11/13/15 169 lb 9.6 oz (76.93 kg)  10/16/15 169 lb 12.8 oz (77.021 kg)      Other studies Reviewed: Additional studies/ records that were reviewed today include and review of the records demonstrates:   Cardiac catheterization 09/18/2015 Conclusion       Prox LAD lesion, 60% stenosed.  2nd Diag lesion, 95% stenosed.  The left ventricular systolic function is normal.  Mid LAD to Dist LAD lesion, 20% stenosed.   Normal LV function without residual wall motion abnormality with an ejection fraction of 55-65%.  Two-vessel CAD with 60% eccentric  smooth tubular ostial LAD stenosis, 20% mid stenosis in the region of a very small bifurcating diagonal vessel with 95% stenosis in the inferior limb of the small diagonal branch; widely patent tandem bare-metal stents in the AV groove circumflex vessel; normal RCA.  RECOMMENDATION: Increased medical therapy.  Nitrates will be added to her regimen of beta blocker, ACE inhibitor and statin.  Consider amlodipine if recurrent symptomatology.         ASSESSMENT AND PLAN:  Preoperative risk stratification cardiovascular  - 2 bare-metal stents were placed to her RCA in the setting of ST elevation myocardial infarction in August 2016  - She is having continuous dysfunctional uterine bleeding at times quite heavy and requires hysterectomy to prevent further complications from anemia.  - I discussed with her primary cardiologist, Dr. Irish Lack and we both feel that she should come off of her Brilinta (which was just started a week ago post polypectomy). Thankfully, she has a bare-metal stent. We believe that she would be at minimal risk at this point for  in-stent thrombosis.   - Post hysterectomy, we can consider restarting Plavix only.  - She may proceed with hysterectomy. She understands mild risk of stent thrombosis at this point.  CAD post MI  - RCA stent 2  Diabetes  - Oral medications.  We will check CBC and basic metabolic profile.  Follow-up with Dr. Irish Lack in approximally 2-3 months.  Bobby Rumpf, MD  11/13/2015 4:23 PM    [facility] [address], [place], [place]  [address] Phone: [phone]; Fax: [phone]

## 2015-11-13 NOTE — Progress Notes (Signed)
Patient ID: Virginia [name], female   DOB: 12-Nov-1960, 55 y.o.   MRN: [id number]  Chief Complaint  Patient presents with  . Follow-up   After endometrial biopsy HPI Virginia [name] is a 55 y.o. female.  [id number] Patient's last menstrual period was 10/21/2015. She continues to have vaginal bleed despite megace.   HPI  Past Medical History  Diagnosis Date  . Hyperlipidemia with target LDL less than 70   . STEMI (ST elevation myocardial infarction) ([place]) 06/16/2015    BMS x 2 CFX  . Diabetes mellitus without complication ([place])   . Hypertension   . CHF (congestive heart failure) ([place])   . CAD (coronary artery disease)     a. 06/2015 BMS x2 to LCx. b. recurrent CP, cath 09/18/2015 patent stent, tiny diag stenosis medical therapy.   Marland Kitchen GERD (gastroesophageal reflux disease)     ?  Marland Kitchen Anemia     Past Surgical History  Procedure Laterality Date  . Appendectomy    . Cesarean section    . Cardiac catheterization N/A 06/17/2015    Procedure: Left Heart Cath and Coronary Angiography;  Surgeon: Jettie Booze, MD;  Location: [facility];  Service: Cardiovascular;  Laterality: N/A;  . Cardiac catheterization  06/17/2015    Procedure: Coronary Stent Intervention;  Surgeon: Jettie Booze, MD;  Location: [facility];  Service: Cardiovascular;;  . Cardiac catheterization N/A 09/18/2015    Procedure: Left Heart Cath and Coronary Angiography;  Surgeon: Troy Sine, MD;  Location: [facility];  Service: Cardiovascular;  Laterality: N/A;  . Esophagogastroduodenoscopy N/A 09/20/2015    Procedure: ESOPHAGOGASTRODUODENOSCOPY (EGD);  Surgeon: Carol Ada, MD;  Location: [facility]OSCOPY;  Service: Endoscopy;  Laterality: N/A;  . Colonoscopy N/A 09/20/2015    Procedure: COLONOSCOPY;  Surgeon: Carol Ada, MD;  Location: [facility];  Service: Endoscopy;  Laterality: N/A;  . Colonoscopy      Family History  Problem Relation Age of Onset  . Stroke Mother   . Heart disease  Mother   . Diabetes Mother   . Cancer Father   . Hypertension Sister   . Hyperlipidemia Sister   . Heart attack Brother   . Hypertension Brother   . Heart attack Maternal Uncle   . Asthma Paternal Uncle   . Diabetes Paternal Uncle   . Thyroid disease Neg Hx   . Diabetes Paternal Aunt     Social History Social History  Substance Use Topics  . Smoking status: Never Smoker   . Smokeless tobacco: Never Used  . Alcohol Use: No    No Known Allergies  Current Outpatient Prescriptions  Medication Sig Dispense Refill  . aspirin EC 81 MG tablet Take 81 mg by mouth daily.    Marland Kitchen atorvastatin (LIPITOR) 80 MG tablet Take 1 tablet (80 mg total) by mouth daily at 6 PM. 90 tablet 3  . glimepiride (AMARYL) 2 MG tablet Take 1 tablet (2 mg total) by mouth daily before breakfast. 30 tablet 3  . isosorbide mononitrate (IMDUR) 30 MG 24 hr tablet Take 0.5 tablets (15 mg total) by mouth daily. 30 tablet 5  . lisinopril (PRINIVIL,ZESTRIL) 2.5 MG tablet Take 1 tablet (2.5 mg total) by mouth daily. 90 tablet 3  . megestrol (MEGACE) 40 MG tablet Take 2 tablets (80 mg total) by mouth 2 (two) times daily. 60 tablet 1  . metoprolol tartrate (LOPRESSOR) 25 MG tablet Take 1 tablet (25 mg total) by mouth 2 (two) times daily.  180 tablet 3  . nitroGLYCERIN (NITROSTAT) 0.4 MG SL tablet Place 1 tablet (0.4 mg total) under the tongue every 5 (five) minutes as needed for chest pain. 25 tablet 2  . pantoprazole (PROTONIX) 40 MG tablet Take 1 tablet (40 mg total) by mouth daily. 30 tablet 0  . ticagrelor (BRILINTA) 90 MG TABS tablet Take 1 tablet (90 mg total) by mouth 2 (two) times daily. 180 tablet 3  . ferrous sulfate 325 (65 FE) MG tablet Take 1 tablet (325 mg total) by mouth 2 (two) times daily with a meal. (Patient not taking: Reported on 11/13/2015) 60 tablet 0   No current facility-administered medications for this visit.    Review of Systems Review of Systems  Respiratory: Negative.   Cardiovascular:  Negative.   Genitourinary: Positive for vaginal bleeding and menstrual problem (bleeding and cramps). Negative for vaginal discharge.    Blood pressure 124/73, pulse 82, temperature 98.2 F (36.8 C), height 5\' 6"  (1.676 m), weight 169 lb 9.6 oz (76.93 kg), last menstrual period 10/21/2015.  Physical Exam Physical Exam  Constitutional: She is oriented to person, place, and time. She appears well-developed. No distress.  Pulmonary/Chest: Effort normal.  Abdominal: She exhibits no mass. There is no tenderness.  Neurological: She is alert and oriented to person, place, and time.  Skin: Skin is warm and dry. No pallor.  Psychiatric: She has a normal mood and affect. Her behavior is normal.  Vitals reviewed.   Data Reviewed  CLINICAL DATA: Dysfunctional uterine bleeding.  EXAM: TRANSABDOMINAL AND TRANSVAGINAL ULTRASOUND OF PELVIS  TECHNIQUE: Both transabdominal and transvaginal ultrasound examinations of the pelvis were performed. Transabdominal technique was performed for global imaging of the pelvis including uterus, ovaries, adnexal regions, and pelvic cul-de-sac. It was necessary to proceed with endovaginal exam following the transabdominal exam to visualize the uterus and right adnexa to better advantage.  COMPARISON: CT, 06/29/2015  FINDINGS: Uterus  Measurements: 14.2 x 7.5 x 6.4 cm. Multiple uterine masses consistent fibroids. Largest arises from the posterior right uterine fundus measuring 3.5 x 2.9 x 2.9 cm. Echogenic material within this is consistent with calcifications, which were present on the prior CT. There are at least 3 additional smaller fibroids.  Endometrium  Not visualized.  Right ovary  Not visualized. No right adnexal mass.  Left ovary  Not visualized. No left adnexal mass.  Other findings  No free fluid.  IMPRESSION: 1. Enlarged uterus with multiple fibroids. 2. Endometrium not visualized. Neither ovary visualized. No  adnexal masses.   Electronically Signed  By: Lajean Manes M.D.  On: 09/19/2015 13:50  Assessment    Postmenopausal bleeding-unresponsive to progestins Fibroid uterus  h/oSTEMI    Plan    Patient is a candidate for TAH/BSO  Needs cardiology clearance for surgery and anesthesia consult after that       [place] 11/13/2015, 12:21 PM

## 2015-11-15 LAB — URINE CULTURE

## 2015-11-18 ENCOUNTER — Telehealth: Payer: Self-pay | Admitting: General Practice

## 2015-11-18 DIAGNOSIS — N390 Urinary tract infection, site not specified: Secondary | ICD-10-CM

## 2015-11-18 MED ORDER — NITROFURANTOIN MONOHYD MACRO 100 MG PO CAPS: 100.0000 mg | ORAL_CAPSULE | Freq: Two times a day (BID) | ORAL | Status: DC

## 2015-11-18 MED FILL — ISOSORBIDE MN ER 30 MG TAB: 30 | 30 days supply | Qty: 15 | Fill #2

## 2015-11-18 MED FILL — ?METOPROLOL 25 MG TABLET: 25 | 30 days supply | Qty: 60 | Fill #4

## 2015-11-18 MED FILL — LISINOPRIL 2.5 MG TABLET: 2.5 | 30 days supply | Qty: 30 | Fill #5

## 2015-11-18 MED FILL — ?NITROFURANTOIN-MACRO 100 M: 100 | 7 days supply | Qty: 14 | Fill #0

## 2015-11-18 NOTE — Telephone Encounter (Signed)
Per Dr Roselie Awkward, patient has UTI and needs macrobid 100mg  bid x 7. Called patient and informed her of results & recommendations. Patient verbalized understanding & had no questions

## 2015-12-02 MED FILL — MEGESTROL 40 MG TABLET: 40 | 15 days supply | Qty: 60 | Fill #0

## 2015-12-02 MED FILL — ATORVASTATIN 80 MG TABLET: 80 | 30 days supply | Qty: 30 | Fill #5

## 2015-12-02 MED FILL — ?GLIMEPIRIDE 2 MG TABLET: 2 | 30 days supply | Qty: 30 | Fill #2

## 2015-12-03 ENCOUNTER — Ambulatory Visit (INDEPENDENT_AMBULATORY_CARE_PROVIDER_SITE_OTHER): Payer: Self-pay | Admitting: Obstetrics & Gynecology

## 2015-12-03 ENCOUNTER — Encounter: Payer: Self-pay | Admitting: Obstetrics & Gynecology

## 2015-12-03 ENCOUNTER — Encounter: Payer: Self-pay | Admitting: Anesthesiology

## 2015-12-03 VITALS — BP 139/76 | HR 80 | Temp 98.9°F | Wt 171.8 lb

## 2015-12-03 DIAGNOSIS — N938 Other specified abnormal uterine and vaginal bleeding: Secondary | ICD-10-CM

## 2015-12-03 MED ORDER — MEGESTROL ACETATE 40 MG PO TABS: 80.0000 mg | ORAL_TABLET | Freq: Two times a day (BID) | ORAL | Status: DC

## 2015-12-03 NOTE — Patient Instructions (Signed)
Abdominal Hysterectomy  Abdominal hysterectomy is a surgical procedure to remove your womb (uterus). Your uterus is the muscular organ that contains a developing baby. This surgery is done for many reasons. You may need an abdominal hysterectomy if you have cancer, growths (tumors), long-term pain, or bleeding. You may also have this procedure if your uterus has slipped down into your vagina (uterine prolapse).  Depending on why you need an abdominal hysterectomy, you may also have other reproductive organs removed. These could include the part of your vagina that connects with your uterus (cervix), the organs that make eggs (ovaries), and the tubes that connect the ovaries to the uterus (fallopian tubes).  LET YOUR HEALTH CARE PROVIDER KNOW ABOUT:    Any allergies you have.   All medicines you are taking, including vitamins, herbs, eye drops, creams, and over-the-counter medicines.   Previous problems you or members of your family have had with the use of anesthetics.   Any blood disorders you have.   Previous surgeries you have had.   Medical conditions you have.  RISKS AND COMPLICATIONS  Generally, this is a safe procedure. However, as with any procedure, problems can occur. Infection is the most common problem after an abdominal hysterectomy. Other possible problems include:   Bleeding.   Formation of blood clots that may break free and travel to your lungs.   Injury to other organs near your uterus.   Nerve injury causing nerve pain.   Decreased interest in sex or pain during sexual intercourse.  BEFORE THE PROCEDURE   Abdominal hysterectomy is a major surgical procedure. It can affect the way you feel about yourself. Talk to your health care provider about the physical and emotional changes hysterectomy may cause.   You may need to have blood work and X-rays done before surgery.   Quit smoking if you smoke. Ask your health care provider for help if you are struggling to quit.   Stop taking  medicines that thin your blood as directed by your health care provider.   You may be instructed to take antibiotic medicines or laxatives before surgery.   Do not eat or drink anything for 6-8 hours before surgery.   Take your regular medicines with a small sip of water.   Bathe or shower the night or morning before surgery.  PROCEDURE   Abdominal hysterectomy is done in the operating room at the hospital.   In most cases, you will be given a medicine that makes you go to sleep (general anesthetic).   The surgeon will make a cut (incision) through the skin in your lower belly.   The incision may be about 5-7 inches long. It may go side-to-side or up-and-down.   The surgeon will move aside the body tissue that covers your uterus. The surgeon will then carefully take out your uterus along with any of your other reproductive organs that need to be removed.   Bleeding will be controlled with clamps or sutures.   The surgeon will close your incision with sutures or metal clips.  AFTER THE PROCEDURE   You will have some pain immediately after the procedure.   You will be given pain medicine in the recovery room.   You will be taken to your hospital room when you have recovered from the anesthesia.   You may need to stay in the hospital for 2-5 days.   You will be given instructions for recovery at home.     This information is not intended 

## 2015-12-03 NOTE — Progress Notes (Signed)
Patient ID: Virginia [name], female   DOB: 1961-03-02, 55 y.o.   MRN: [id number]  Chief Complaint  Patient presents with  . Follow-up  still bleeding on Megace  HPI Virginia [name] is a 55 y.o. female.  [id number] Wishes to schedule TAH, has medical clearance from cardiology. Takes 40 mg Megace BID, still bleeding.  HPI  Past Medical History  Diagnosis Date  . Hyperlipidemia with target LDL less than 70   . STEMI (ST elevation myocardial infarction) ([place]) 06/16/2015    BMS x 2 CFX  . Diabetes mellitus without complication ([place])   . Hypertension   . CHF (congestive heart failure) ([place])   . CAD (coronary artery disease)     a. 06/2015 BMS x2 to LCx. b. recurrent CP, cath 09/18/2015 patent stent, tiny diag stenosis medical therapy.   Marland Kitchen GERD (gastroesophageal reflux disease)     ?  Marland Kitchen Anemia     Past Surgical History  Procedure Laterality Date  . Appendectomy    . Cardiac catheterization N/A 06/17/2015    Procedure: Left Heart Cath and Coronary Angiography;  Surgeon: Jettie Booze, MD;  Location: [facility];  Service: Cardiovascular;  Laterality: N/A;  . Cardiac catheterization  06/17/2015    Procedure: Coronary Stent Intervention;  Surgeon: Jettie Booze, MD;  Location: [facility];  Service: Cardiovascular;;  . Cardiac catheterization N/A 09/18/2015    Procedure: Left Heart Cath and Coronary Angiography;  Surgeon: Troy Sine, MD;  Location: [facility];  Service: Cardiovascular;  Laterality: N/A;  . Esophagogastroduodenoscopy N/A 09/20/2015    Procedure: ESOPHAGOGASTRODUODENOSCOPY (EGD);  Surgeon: Carol Ada, MD;  Location: [facility]OSCOPY;  Service: Endoscopy;  Laterality: N/A;  . Colonoscopy N/A 09/20/2015    Procedure: COLONOSCOPY;  Surgeon: Carol Ada, MD;  Location: [facility];  Service: Endoscopy;  Laterality: N/A;  . Colonoscopy    . Cesarean section      Myomectomy done during cesarean section    Family History  Problem Relation Age  of Onset  . Stroke Mother   . Heart disease Mother   . Diabetes Mother   . Cancer Father   . Hypertension Sister   . Hyperlipidemia Sister   . Heart attack Brother   . Hypertension Brother   . Heart attack Maternal Uncle   . Asthma Paternal Uncle   . Diabetes Paternal Uncle   . Thyroid disease Neg Hx   . Diabetes Paternal Aunt     Social History Social History  Substance Use Topics  . Smoking status: Never Smoker   . Smokeless tobacco: Never Used  . Alcohol Use: No    No Known Allergies  Current Outpatient Prescriptions  Medication Sig Dispense Refill  . aspirin EC 81 MG tablet Take 81 mg by mouth daily.    Marland Kitchen atorvastatin (LIPITOR) 80 MG tablet Take 1 tablet (80 mg total) by mouth daily at 6 PM. 90 tablet 3  . glimepiride (AMARYL) 2 MG tablet Take 1 tablet (2 mg total) by mouth daily before breakfast. 30 tablet 3  . isosorbide mononitrate (IMDUR) 30 MG 24 hr tablet Take 0.5 tablets (15 mg total) by mouth daily. 30 tablet 5  . lisinopril (PRINIVIL,ZESTRIL) 2.5 MG tablet Take 1 tablet (2.5 mg total) by mouth daily. 90 tablet 3  . megestrol (MEGACE) 40 MG tablet Take 2 tablets (80 mg total) by mouth 2 (two) times daily. 120 tablet 2  . metoprolol tartrate (LOPRESSOR) 25 MG tablet Take 1  tablet (25 mg total) by mouth 2 (two) times daily. 180 tablet 3  . nitroGLYCERIN (NITROSTAT) 0.4 MG SL tablet Place 1 tablet (0.4 mg total) under the tongue every 5 (five) minutes as needed for chest pain. 25 tablet 2  . pantoprazole (PROTONIX) 40 MG tablet Take 1 tablet (40 mg total) by mouth daily. 30 tablet 0  . nitrofurantoin, macrocrystal-monohydrate, (MACROBID) 100 MG capsule Take 1 capsule (100 mg total) by mouth 2 (two) times daily. (Patient not taking: Reported on 12/03/2015) 14 capsule 0   No current facility-administered medications for this visit.    Review of Systems Review of Systems  Constitutional: Negative.   Genitourinary: Positive for vaginal bleeding (some clots), menstrual  problem and pelvic pain.    Blood pressure 139/76, pulse 80, temperature 98.9 F (37.2 C), temperature source Oral, weight 171 lb 12.8 oz (77.928 kg), last menstrual period 10/21/2015.  Physical Exam Physical Exam  Constitutional: She appears well-developed. No distress.  Cardiovascular: Normal rate.   Pulmonary/Chest: Effort normal.  Abdominal: Soft. She exhibits mass (12 week size uterus). There is no tenderness.  Psychiatric: She has a normal mood and affect. Her behavior is normal.    Data Reviewed Cardiology note, [place] report, biopsy result  Assessment    DUB, fibroid Patient Active Problem List   Diagnosis Date Noted  . Fibroid uterus 11/13/2015  . Transient right leg weakness 10/05/2015  . Acute GI bleeding   . Symptomatic anemia 09/19/2015  . Vasovagal near syncope 09/19/2015  . Dysfunctional uterine bleeding 09/19/2015  . Diarrhea due to drug 09/19/2015  . Heme positive stool 09/19/2015  . Chest pain 09/17/2015  . Multinodular goiter 07/09/2015  . CAD S/P CFX BMS (? med complinace) x 2 06/16/15 07/01/2015  . Acute renal insufficiency 07/01/2015  . Abdominal pain 07/01/2015  . Nodular goiter 06/23/2015  . Diabetes mellitus type 2, controlled ([place]) 06/23/2015  . STEMI -06/16/15 06/17/2015  . Hyperlipidemia with target LDL less than 70         Plan    Anesthesia to review pre-op Will schedule TAH BSO Megace 80 mg daily        Atia Haupt 12/03/2015, 3:45 PM

## 2015-12-04 ENCOUNTER — Telehealth: Payer: Self-pay | Admitting: *Deleted

## 2015-12-04 NOTE — Telephone Encounter (Signed)
Called patient today to update her on plans for surgery. I had spoken to Dr Roselie Awkward this morning who stated that he had consulted with Dr Clide Cliff in anesthesia about this patient and Dr Clide Cliff had told him he is comfortable with this patient having her hysterectomy here at [facility] patient of this and told her she should expect to be contacted regarding scheduling soon. Patient voiced understanding.

## 2015-12-09 ENCOUNTER — Encounter (HOSPITAL_COMMUNITY): Payer: Self-pay | Admitting: *Deleted

## 2015-12-17 MED FILL — LISINOPRIL 2.5 MG TABLET: 2.5 | 30 days supply | Qty: 30 | Fill #0

## 2015-12-17 MED FILL — ?METOPROLOL 25 MG TABLET: 25 | 30 days supply | Qty: 60 | Fill #5

## 2015-12-17 MED FILL — ISOSORBIDE MN ER 30 MG TAB: 30 | 30 days supply | Qty: 15 | Fill #3

## 2015-12-22 ENCOUNTER — Telehealth: Payer: Self-pay | Admitting: Internal Medicine

## 2015-12-22 NOTE — Telephone Encounter (Signed)
Patient would like to speak with provider regarding Metformin RX. She is not pleased with the results of the glimepiride (AMARYL) 2 MG tablet. Please call to discuss.

## 2015-12-24 MED FILL — ATORVASTATIN 80 MG TABLET: 80 | 30 days supply | Qty: 30 | Fill #0

## 2015-12-24 NOTE — Telephone Encounter (Signed)
Patient called to state she has not had a call back and is almost out of medication amaryl. She does not want a refill on this but to discuss changing back to metformin. Please advise. Thanks!

## 2015-12-29 ENCOUNTER — Encounter: Payer: Self-pay | Admitting: Internal Medicine

## 2015-12-29 ENCOUNTER — Ambulatory Visit (INDEPENDENT_AMBULATORY_CARE_PROVIDER_SITE_OTHER): Payer: No Typology Code available for payment source | Admitting: Internal Medicine

## 2015-12-29 VITALS — BP 120/57 | HR 78 | Temp 98.4°F | Resp 18 | Ht 64.0 in | Wt 170.0 lb

## 2015-12-29 DIAGNOSIS — E119 Type 2 diabetes mellitus without complications: Secondary | ICD-10-CM

## 2015-12-29 DIAGNOSIS — I10 Essential (primary) hypertension: Secondary | ICD-10-CM | POA: Insufficient documentation

## 2015-12-29 LAB — CBC WITH DIFFERENTIAL/PLATELET
Basophils Absolute: 0 10*3/uL (ref 0.0–0.1)
Basophils Relative: 0 % (ref 0–1)
EOS PCT: 4 % (ref 0–5)
Eosinophils Absolute: 0.2 10*3/uL (ref 0.0–0.7)
HEMATOCRIT: 35.1 % — AB (ref 36.0–46.0)
HEMOGLOBIN: 10.8 g/dL — AB (ref 12.0–15.0)
LYMPHS ABS: 2 10*3/uL (ref 0.7–4.0)
LYMPHS PCT: 41 % (ref 12–46)
MCH: 28.1 pg (ref 26.0–34.0)
MCHC: 30.8 g/dL (ref 30.0–36.0)
MCV: 91.2 fL (ref 78.0–100.0)
MONO ABS: 0.4 10*3/uL (ref 0.1–1.0)
MONOS PCT: 9 % (ref 3–12)
MPV: 10.3 fL (ref 8.6–12.4)
NEUTROS ABS: 2.3 10*3/uL (ref 1.7–7.7)
Neutrophils Relative %: 46 % (ref 43–77)
Platelets: 243 10*3/uL (ref 150–400)
RBC: 3.85 MIL/uL — ABNORMAL LOW (ref 3.87–5.11)
RDW: 15.4 % (ref 11.5–15.5)
WBC: 4.9 10*3/uL (ref 4.0–10.5)

## 2015-12-29 LAB — GLUCOSE, CAPILLARY: GLUCOSE-CAPILLARY: 128 mg/dL — AB (ref 65–99)

## 2015-12-29 LAB — HEMOGLOBIN A1C
Hgb A1c MFr Bld: 6.1 % — ABNORMAL HIGH (ref <5.7)
Mean Plasma Glucose: 128 mg/dL — ABNORMAL HIGH (ref <117)

## 2015-12-29 MED ORDER — GLUCOSE BLOOD VI STRP: ORAL_STRIP | Status: DC

## 2015-12-29 MED ORDER — METFORMIN HCL 500 MG PO TABS: 500.0000 mg | ORAL_TABLET | Freq: Two times a day (BID) | ORAL | Status: DC

## 2015-12-29 MED ORDER — FERROUS SULFATE 325 (65 FE) MG PO TABS: 325.0000 mg | ORAL_TABLET | Freq: Every day | ORAL | Status: DC

## 2015-12-29 MED ORDER — TRUEPLUS LANCETS 28G MISC: Status: DC

## 2015-12-29 MED ORDER — TRUE METRIX METER W/DEVICE KIT: PACK | Status: DC

## 2015-12-29 MED FILL — metFORMIN HCL 500 MG TABS: 500 | 30 days supply | Qty: 60 | Fill #0

## 2015-12-29 MED FILL — FERROUS SULFATE 325 MG TAB: 325 (65 FE) | 90 days supply | Qty: 90 | Fill #0

## 2015-12-29 NOTE — Progress Notes (Signed)
Virginia [name], is a 55 y.o. female  [id number]  [id number]  DOB - Mar 31, 1961  CC:  Chief Complaint  Patient presents with  . Follow-up    DM      HPI Virginia [name] is a 55 y.o. female here today for follow up visit. Patient has history of type 2 diabetes mellitus (recent diagnosis -06/2015 - started on metformin), dysfunctional uterine bleeding, chest pain w/ cardiac catheterization 09/19/2015, and history of EGD with bleeding polyp which was hemoclipped on 09/20/2015. Patient metformin was stopped after report of watery and crampy diarrhea since 07/2015. Patient was started on glimepiride at that time and currently has complaints of weight gain, dry mouth and frequent urination.  Patient states compliance with all medications but would like to stop glimepiride and resume metformin. Patient currently not checking CBG at home because she doesn't have meter. Patient states no current GI upset, no black tarry stools, no diarrhea, no N/V, no abdominal pain, no new tingling or numbness, no cough, no SOB, no chest pain, pedal edema or headache. Patient is currently being followed by Dr Roselie Awkward at [facility] and has Total Abdominal Hysterectomy scheduled for 02/02/2016. Patient is also being seen by Dr Marlou Porch of [facility] and Dr Silverio Decamp of [facility]. Patient has not other complaints today.    No Known Allergies Past Medical History  Diagnosis Date  . Hyperlipidemia with target LDL less than 70   . STEMI (ST elevation myocardial infarction) ([place]) 06/16/2015    BMS x 2 CFX  . Diabetes mellitus without complication ([place])   . Hypertension   . CHF (congestive heart failure) ([place])   . CAD (coronary artery disease)     a. 06/2015 BMS x2 to LCx. b. recurrent CP, cath 09/18/2015 patent stent, tiny diag stenosis medical therapy.   Marland Kitchen GERD (gastroesophageal reflux disease)     ?  Marland Kitchen Anemia    Current Outpatient Prescriptions on File Prior to Visit  Medication Sig Dispense Refill  .  aspirin EC 81 MG tablet Take 81 mg by mouth daily.    Marland Kitchen atorvastatin (LIPITOR) 80 MG tablet Take 1 tablet (80 mg total) by mouth daily at 6 PM. 90 tablet 3  . isosorbide mononitrate (IMDUR) 30 MG 24 hr tablet Take 0.5 tablets (15 mg total) by mouth daily. 30 tablet 5  . lisinopril (PRINIVIL,ZESTRIL) 2.5 MG tablet Take 1 tablet (2.5 mg total) by mouth daily. 90 tablet 3  . megestrol (MEGACE) 40 MG tablet Take 2 tablets (80 mg total) by mouth 2 (two) times daily. 120 tablet 2  . metoprolol tartrate (LOPRESSOR) 25 MG tablet Take 1 tablet (25 mg total) by mouth 2 (two) times daily. 180 tablet 3  . nitroGLYCERIN (NITROSTAT) 0.4 MG SL tablet Place 1 tablet (0.4 mg total) under the tongue every 5 (five) minutes as needed for chest pain. 25 tablet 2  . pantoprazole (PROTONIX) 40 MG tablet Take 1 tablet (40 mg total) by mouth daily. 30 tablet 0   No current facility-administered medications on file prior to visit.   Family History  Problem Relation Age of Onset  . Stroke Mother   . Heart disease Mother   . Diabetes Mother   . Cancer Father   . Hypertension Sister   . Hyperlipidemia Sister   . Heart attack Brother   . Hypertension Brother   . Heart attack Maternal Uncle   . Asthma Paternal Uncle   . Diabetes Paternal Uncle   . Thyroid disease Neg  Hx   . Diabetes Paternal Aunt    Social History   Social History  . Marital Status: Married    Spouse Name: N/A  . Number of Children: N/A  . Years of Education: N/A   Occupational History  . Not on file.   Social History Main Topics  . Smoking status: Never Smoker   . Smokeless tobacco: Never Used  . Alcohol Use: No  . Drug Use: No  . Sexual Activity: No   Other Topics Concern  . Not on file   Social History Narrative    Review of Systems: Constitutional: Negative for fever, chills, diaphoresis, activity change, appetite change and fatigue. HENT: Negative for ear pain, nosebleeds, congestion, facial swelling, rhinorrhea, neck  pain, neck stiffness and ear discharge.  Eyes: Negative for pain, discharge, redness, itching and visual disturbance. Respiratory: Negative for cough, choking, chest tightness, shortness of breath, wheezing and stridor.  Cardiovascular: Negative for chest pain, palpitations and leg swelling. Gastrointestinal: Negative for abdominal distention. Genitourinary: Negative for dysuria, urgency, frequency, hematuria, flank pain, decreased urine volume, difficulty urinating and dyspareunia.  Musculoskeletal: Negative for back pain, joint swelling, arthralgia and gait problem. Neurological: Negative for dizziness, tremors, seizures, syncope, facial asymmetry, speech difficulty, weakness, light-headedness, numbness and headaches.  Hematological: Negative for adenopathy. Does not bruise/bleed easily. Psychiatric/Behavioral: Negative for hallucinations, behavioral problems, confusion, dysphoric mood, decreased concentration and agitation.    Objective:   Filed Vitals:   12/29/15 1014  BP: 120/57  Pulse: 78  Temp: 98.4 F (36.9 C)  Resp: 18    Physical Exam: Constitutional: Patient appears well-developed and well-nourished. No distress. HENT: Normocephalic, atraumatic, External right and left ear normal. Oropharynx is clear and moist.  Eyes: Conjunctivae and EOM are normal. PERRLA, no scleral icterus. Neck: Normal ROM. Neck supple. No JVD. No tracheal deviation. No thyromegaly. CVS: RRR, S1/S2 +, no murmurs, no gallops, no carotid bruit.  Pulmonary: Effort and breath sounds normal, no stridor, rhonchi, wheezes, rales.  Abdominal: Soft. BS +, no distension, tenderness, rebound or guarding.  Musculoskeletal: Normal range of motion. No edema and no tenderness.  Lymphadenopathy: No lymphadenopathy noted. Neuro: Alert X Oriented X 4. Skin: Skin is warm and dry. No rash noted. Not diaphoretic. No erythema. No pallor. Psychiatric: Normal mood and affect. Behavior, judgment, thought content  normal.  Lab Results  Component Value Date   WBC 8.8 11/13/2015   HGB 12.1 11/13/2015   HCT 36.1 11/13/2015   MCV 90.5 11/13/2015   PLT 298 11/13/2015   Lab Results  Component Value Date   CREATININE 0.97 11/13/2015   BUN 11 11/13/2015   NA 136 11/13/2015   K 3.6 11/13/2015   CL 106 11/13/2015   CO2 21 11/13/2015    Lab Results  Component Value Date   HGBA1C 6.4* 09/19/2015   Lipid Panel     Component Value Date/Time   CHOL 95 09/18/2015 0408   TRIG 81 09/18/2015 0408   HDL 29* 09/18/2015 0408   CHOLHDL 3.3 09/18/2015 0408   VLDL 16 09/18/2015 0408   LDLCALC 50 09/18/2015 0408       Assessment and plan:   Virginia [name] was seen today for follow-up.  Diagnoses and all orders for this visit:  Controlled type 2 diabetes mellitus without complication, without long-term current use of insulin (HCC)  - Discontinue Glimepiride, start Metformin  -     Hemoglobin A1c check -     ferrous sulfate 325 (65 FE) MG tablet; Take 1 tablet (325 mg  total) by mouth daily with breakfast. -     metFORMIN (GLUCOPHAGE) 500 MG tablet; Take 1 tablet (500 mg total) by mouth 2 (two) times daily with a meal. -     CBC with Differential/Platelet  Patient was instructed on use of blood glucose monitoring meter and given BS logs for recording daily BS. Aim for 30 minutes of exercise most days. Rethink what you drink. Water is great! Aim for 2-3 Carb Choices per meal (30-45 grams) +/- 1 either way  Aim for 0-15 Carbs per snack if hungry  Include protein in moderation with your meals and snacks  Consider reading food labels for Total Carbohydrate and Fat Grams of foods  Consider checking BG at alternate times per day  Continue taking medication as directed Be mindful about how much sugar you are adding to beverages and other foods. Fruit Punch - find one with no sugar  Measure and decrease portions of carbohydrate foods  Make your plate and don't go back for seconds   Essential  hypertension  We have discussed target BP range and blood pressure goal. I have advised patient to check BP regularly and to call us back or report to clinic if the numbers are consistently higher than 140/90. We discussed the importance of compliance with medical therapy and DASH diet recommended, consequences of uncontrolled hypertension discussed.   - continue current BP medications  Return in about 3 months (around 03/27/2016) for Hemoglobin A1C and Follow up, DM, Follow up HTN.  The patient was given clear instructions to go to ER or return to medical center if symptoms don't improve, worsen or new problems develop. The patient verbalized understanding. The patient was told to call to get lab results if they haven't heard anything in the next week.     [facility] [phone] 12/29/2015, 12:16 PM  Evaluation and management procedures were performed by the Advanced Practitioner under my supervision and collaboration. I have reviewed the Advanced Practitioner's note and chart, and I agree with the management and plan.   [name], MD, [place], [place], [place], [place] and [place], [facility]   12/29/2015, 7:04 PM

## 2015-12-29 NOTE — Patient Instructions (Addendum)
Diabetes and Exercise Exercising regularly is important. It is not just about losing weight. It has many health benefits, such as:  Improving your overall fitness, flexibility, and endurance.  Increasing your bone density.  Helping with weight control.  Decreasing your body fat.  Increasing your muscle strength.  Reducing stress and tension.  Improving your overall health. People with diabetes who exercise gain additional benefits because exercise:  Reduces appetite.  Improves the body's use of blood sugar (glucose).  Helps lower or control blood glucose.  Decreases blood pressure.  Helps control blood lipids (such as cholesterol and triglycerides).  Improves the body's use of the hormone insulin by:  Increasing the body's insulin sensitivity.  Reducing the body's insulin needs.  Decreases the risk for heart disease because exercising:  Lowers cholesterol and triglycerides levels.  Increases the levels of good cholesterol (such as high-density lipoproteins [HDL]) in the body.  Lowers blood glucose levels. YOUR ACTIVITY PLAN  Choose an activity that you enjoy, and set realistic goals. To exercise safely, you should begin practicing any new physical activity slowly, and gradually increase the intensity of the exercise over time. Your health care provider or diabetes educator can help create an activity plan that works for you. General recommendations include:  Encouraging children to engage in at least 60 minutes of physical activity each day.  Stretching and performing strength training exercises, such as yoga or weight lifting, at least 2 times per week.  Performing a total of at least 150 minutes of moderate-intensity exercise each week, such as brisk walking or water aerobics.  Exercising at least 3 days per week, making sure you allow no more than 2 consecutive days to pass without exercising.  Avoiding long periods of inactivity (90 minutes or more). When you  have to spend an extended period of time sitting down, take frequent breaks to walk or stretch. RECOMMENDATIONS FOR EXERCISING WITH TYPE 1 OR TYPE 2 DIABETES   Check your blood glucose before exercising. If blood glucose levels are greater than 240 mg/dL, check for urine ketones. Do not exercise if ketones are present.  Avoid injecting insulin into areas of the body that are going to be exercised. For example, avoid injecting insulin into:  The arms when playing tennis.  The legs when jogging.  Keep a record of:  Food intake before and after you exercise.  Expected peak times of insulin action.  Blood glucose levels before and after you exercise.  The type and amount of exercise you have done.  Review your records with your health care provider. Your health care provider will help you to develop guidelines for adjusting food intake and insulin amounts before and after exercising.  If you take insulin or oral hypoglycemic agents, watch for signs and symptoms of hypoglycemia. They include:  Dizziness.  Shaking.  Sweating.  Chills.  Confusion.  Drink plenty of water while you exercise to prevent dehydration or heat stroke. Body water is lost during exercise and must be replaced.  Talk to your health care provider before starting an exercise program to make sure it is safe for you. Remember, almost any type of activity is better than none.   This information is not intended to replace advice given to you by your health care provider. Make sure you discuss any questions you have with your health care provider.   Document Released: 01/14/2004 Document Revised: 03/10/2015 Document Reviewed: 04/02/2013 Elsevier Interactive Patient Education 2016 [place]. Diabetes and Foot Care Diabetes may cause you  to have problems because of poor blood supply (circulation) to your feet and legs. This may cause the skin on your feet to become thinner, break easier, and heal more slowly.  Your skin may become dry, and the skin may peel and crack. You may also have nerve damage in your legs and feet causing decreased feeling in them. You may not notice minor injuries to your feet that could lead to infections or more serious problems. Taking care of your feet is one of the most important things you can do for yourself.  HOME CARE INSTRUCTIONS  Wear shoes at all times, even in the house. Do not go barefoot. Bare feet are easily injured.  Check your feet daily for blisters, cuts, and redness. If you cannot see the bottom of your feet, use a mirror or ask someone for help.  Wash your feet with warm water (do not use hot water) and mild soap. Then pat your feet and the areas between your toes until they are completely dry. Do not soak your feet as this can dry your skin.  Apply a moisturizing lotion or petroleum jelly (that does not contain alcohol and is unscented) to the skin on your feet and to dry, brittle toenails. Do not apply lotion between your toes.  Trim your toenails straight across. Do not dig under them or around the cuticle. File the edges of your nails with an emery board or nail file.  Do not cut corns or calluses or try to remove them with medicine.  Wear clean socks or stockings every day. Make sure they are not too tight. Do not wear knee-high stockings since they may decrease blood flow to your legs.  Wear shoes that fit properly and have enough cushioning. To break in new shoes, wear them for just a few hours a day. This prevents you from injuring your feet. Always look in your shoes before you put them on to be sure there are no objects inside.  Do not cross your legs. This may decrease the blood flow to your feet.  If you find a minor scrape, cut, or break in the skin on your feet, keep it and the skin around it clean and dry. These areas may be cleansed with mild soap and water. Do not cleanse the area with peroxide, alcohol, or iodine.  When you remove an  adhesive bandage, be sure not to damage the skin around it.  If you have a wound, look at it several times a day to make sure it is healing.  Do not use heating pads or hot water bottles. They may burn your skin. If you have lost feeling in your feet or legs, you may not know it is happening until it is too late.  Make sure your health care provider performs a complete foot exam at least annually or more often if you have foot problems. Report any cuts, sores, or bruises to your health care provider immediately. SEEK MEDICAL CARE IF:   You have an injury that is not healing.  You have cuts or breaks in the skin.  You have an ingrown nail.  You notice redness on your legs or feet.  You feel burning or tingling in your legs or feet.  You have pain or cramps in your legs and feet.  Your legs or feet are numb.  Your feet always feel cold. SEEK IMMEDIATE MEDICAL CARE IF:   There is increasing redness, swelling, or pain in or around a   wound.  There is a red line that goes up your leg.  Pus is coming from a wound.  You develop a fever or as directed by your health care provider.  You notice a bad smell coming from an ulcer or wound.   This information is not intended to replace advice given to you by your health care provider. Make sure you discuss any questions you have with your health care provider.   Document Released: 10/21/2000 Document Revised: 06/26/2013 Document Reviewed: 04/02/2013 Elsevier Interactive Patient Education 2016 [place] Carbohydrate Counting for Diabetes Mellitus Carbohydrate counting is a method for keeping track of the amount of carbohydrates you eat. Eating carbohydrates naturally increases the level of sugar (glucose) in your blood, so it is important for you to know the amount that is okay for you to have in every meal. Carbohydrate counting helps keep the level of glucose in your blood within normal limits. The amount of carbohydrates  allowed is different for every person. A dietitian can help you calculate the amount that is right for you. Once you know the amount of carbohydrates you can have, you can count the carbohydrates in the foods you want to eat. Carbohydrates are found in the following foods:  Grains, such as breads and cereals.  Dried beans and soy products.  Starchy vegetables, such as potatoes, peas, and corn.  Fruit and fruit juices.  Milk and yogurt.  Sweets and snack foods, such as cake, cookies, candy, chips, soft drinks, and fruit drinks. CARBOHYDRATE COUNTING There are two ways to count the carbohydrates in your food. You can use either of the methods or a combination of both. Reading the "Nutrition Facts" on [place] The "Nutrition Facts" is an area that is included on the labels of almost all packaged food and beverages in the [place]. It includes the serving size of that food or beverage and information about the nutrients in each serving of the food, including the grams (g) of carbohydrate per serving.  Decide the number of servings of this food or beverage that you will be able to eat or drink. Multiply that number of servings by the number of grams of carbohydrate that is listed on the label for that serving. The total will be the amount of carbohydrates you will be having when you eat or drink this food or beverage. Learning Standard Serving Sizes of Food When you eat food that is not packaged or does not include "Nutrition Facts" on the label, you need to measure the servings in order to count the amount of carbohydrates.A serving of most carbohydrate-rich foods contains about 15 g of carbohydrates. The following list includes serving sizes of carbohydrate-rich foods that provide 15 g ofcarbohydrate per serving:   1 slice of bread (1 oz) or 1 six-inch tortilla.    of a hamburger bun or English muffin.  4-6 crackers.   cup unsweetened dry cereal.    cup hot cereal.    cup rice or pasta.    cup mashed potatoes or  of a large baked potato.  1 cup fresh fruit or one small piece of fruit.    cup canned or frozen fruit or fruit juice.  1 cup milk.   cup plain fat-free yogurt or yogurt sweetened with artificial sweeteners.   cup cooked dried beans or starchy vegetable, such as peas, corn, or potatoes.  Decide the number of standard-size servings that you will eat. Multiply that number of servings by 15 (the grams  of carbohydrates in that serving). For example, if you eat 2 cups of strawberries, you will have eaten 2 servings and 30 g of carbohydrates (2 servings x 15 g = 30 g). For foods such as soups and casseroles, in which more than one food is mixed in, you will need to count the carbohydrates in each food that is included. EXAMPLE OF CARBOHYDRATE COUNTING Sample Dinner  3 oz chicken breast.   cup of brown rice.   cup of corn.  1 cup milk.   1 cup strawberries with sugar-free whipped topping.  Carbohydrate Calculation Step 1: Identify the foods that contain carbohydrates:   Rice.   Corn.   Milk.   Strawberries. Step 2:Calculate the number of servings eaten of each:   2 servings of rice.   1 serving of corn.   1 serving of milk.   1 serving of strawberries. Step 3: Multiply each of those number of servings by 15 g:   2 servings of rice x 15 g = 30 g.   1 serving of corn x 15 g = 15 g.   1 serving of milk x 15 g = 15 g.   1 serving of strawberries x 15 g = 15 g. Step 4: Add together all of the amounts to find the total grams of carbohydrates eaten: 30 g + 15 g + 15 g + 15 g = 75 g.   This information is not intended to replace advice given to you by your health care provider. Make sure you discuss any questions you have with your health care provider.   Document Released: 10/24/2005 Document Revised: 11/14/2014 Document Reviewed: 09/20/2013 Elsevier Interactive Patient Education 2016 [place] DASH stands for "Dietary Approaches to Stop Hypertension." The DASH eating plan is a healthy eating plan that has been shown to reduce high blood pressure (hypertension). Additional health benefits may include reducing the risk of type 2 diabetes mellitus, heart disease, and stroke. The DASH eating plan may also help with weight loss. WHAT DO I NEED TO KNOW ABOUT THE DASH EATING PLAN? For the DASH eating plan, you will follow these general guidelines:  Choose foods with a percent daily value for sodium of less than 5% (as listed on the food label).  Use salt-free seasonings or herbs instead of table salt or sea salt.  Check with your health care provider or pharmacist before using salt substitutes.  Eat lower-sodium products, often labeled as "lower sodium" or "no salt added."  Eat fresh foods.  Eat more vegetables, fruits, and low-fat dairy products.  Choose whole grains. Look for the word "whole" as the first word in the ingredient list.  Choose fish and skinless chicken or [place] more often than red meat. Limit fish, poultry, and meat to 6 oz (170 g) each day.  Limit sweets, desserts, sugars, and sugary drinks.  Choose heart-healthy fats.  Limit cheese to 1 oz (28 g) per day.  Eat more home-cooked food and less restaurant, buffet, and fast food.  Limit fried foods.  Cook foods using methods other than frying.  Limit canned vegetables. If you do use them, rinse them well to decrease the sodium.  When eating at a restaurant, ask that your food be prepared with less salt, or no salt if possible. WHAT FOODS CAN I EAT? Seek help from a dietitian for individual calorie needs. Grains Whole grain or whole wheat bread. Brown rice. Whole grain or whole wheat pasta. Quinoa, bulgur, and  whole grain cereals. Low-sodium cereals. Corn or whole wheat flour tortillas. Whole grain cornbread. Whole grain crackers. Low-sodium crackers. Vegetables Fresh or frozen  vegetables (raw, steamed, roasted, or grilled). Low-sodium or reduced-sodium tomato and vegetable juices. Low-sodium or reduced-sodium tomato sauce and paste. Low-sodium or reduced-sodium canned vegetables.  Fruits All fresh, canned (in natural juice), or frozen fruits. Meat and Other Protein Products Ground beef (85% or leaner), grass-fed beef, or beef trimmed of fat. Skinless chicken or [place]. Ground chicken or [place]. Pork trimmed of fat. All fish and seafood. Eggs. Dried beans, peas, or lentils. Unsalted nuts and seeds. Unsalted canned beans. Dairy Low-fat dairy products, such as skim or 1% milk, 2% or reduced-fat cheeses, low-fat ricotta or cottage cheese, or plain low-fat yogurt. Low-sodium or reduced-sodium cheeses. Fats and Oils Tub margarines without trans fats. Light or reduced-fat mayonnaise and salad dressings (reduced sodium). Avocado. Safflower, olive, or canola oils. Natural peanut or almond butter. Other Unsalted popcorn and pretzels. The items listed above may not be a complete list of recommended foods or beverages. Contact your dietitian for more options. WHAT FOODS ARE NOT RECOMMENDED? Grains White bread. White pasta. White rice. Refined cornbread. Bagels and croissants. Crackers that contain trans fat. Vegetables Creamed or fried vegetables. Vegetables in a cheese sauce. Regular canned vegetables. Regular canned tomato sauce and paste. Regular tomato and vegetable juices. Fruits Dried fruits. Canned fruit in light or heavy syrup. Fruit juice. Meat and Other Protein Products Fatty cuts of meat. Ribs, chicken wings, bacon, sausage, bologna, salami, chitterlings, fatback, hot dogs, bratwurst, and packaged luncheon meats. Salted nuts and seeds. Canned beans with salt. Dairy Whole or 2% milk, cream, half-and-half, and cream cheese. Whole-fat or sweetened yogurt. Full-fat cheeses or blue cheese. Nondairy creamers and whipped toppings. Processed cheese, cheese spreads, or cheese  curds. Condiments Onion and garlic salt, seasoned salt, table salt, and sea salt. Canned and packaged gravies. Worcestershire sauce. Tartar sauce. Barbecue sauce. Teriyaki sauce. Soy sauce, including reduced sodium. Steak sauce. Fish sauce. Oyster sauce. Cocktail sauce. Horseradish. Ketchup and mustard. Meat flavorings and tenderizers. Bouillon cubes. Hot sauce. Tabasco sauce. Marinades. Taco seasonings. Relishes. Fats and Oils Butter, stick margarine, lard, shortening, ghee, and bacon fat. Coconut, palm kernel, or palm oils. Regular salad dressings. Other Pickles and olives. Salted popcorn and pretzels. The items listed above may not be a complete list of foods and beverages to avoid. Contact your dietitian for more information. WHERE CAN I FIND MORE INFORMATION? National Heart, Lung, and Blood Institute: travelstabloid.com   This information is not intended to replace advice given to you by your health care provider. Make sure you discuss any questions you have with your health care provider.   Document Released: 10/13/2011 Document Revised: 11/14/2014 Document Reviewed: 08/28/2013 Elsevier Interactive Patient Education [employer].

## 2015-12-29 NOTE — Progress Notes (Signed)
Patient is here for FU DM  Patient denies pain at this time.  Patient would like to speak about switching back to Metformin.

## 2015-12-30 MED FILL — TRUE METRIX TEST STRIP: 30 days supply | Qty: 100 | Fill #0

## 2015-12-30 MED FILL — TRUE METRIX BLOOD GLUCOSE M: W/DEVICE | 365 days supply | Qty: 1 | Fill #0

## 2015-12-30 MED FILL — TRUEplus LANCETS 28G MISC: 30 days supply | Qty: 100 | Fill #0

## 2016-01-01 ENCOUNTER — Telehealth: Payer: Self-pay | Admitting: *Deleted

## 2016-01-01 NOTE — Telephone Encounter (Signed)
-----   Message from Tresa Garter, MD sent at 12/30/2015  4:35 PM EST ----- Please inform patient that her hemoglobin A1c shows stable blood sugar control, continue metformin as prescribed and diabetic diet. Hemoglobin is slightly low, encouraged patient to keep appointment with gynecologist for the upcoming surgery.

## 2016-01-01 NOTE — Telephone Encounter (Signed)
Medical Assistant left message on patient's home and cell voicemail. Voicemail states to give a call back to Nubia with [facility] at [phone].  

## 2016-01-06 ENCOUNTER — Encounter: Payer: Self-pay | Admitting: *Deleted

## 2016-01-08 ENCOUNTER — Other Ambulatory Visit: Payer: Self-pay | Admitting: Obstetrics & Gynecology

## 2016-01-11 ENCOUNTER — Ambulatory Visit: Payer: No Typology Code available for payment source | Attending: Internal Medicine

## 2016-01-12 ENCOUNTER — Telehealth: Payer: Self-pay | Admitting: *Deleted

## 2016-01-12 DIAGNOSIS — K0889 Other specified disorders of teeth and supporting structures: Secondary | ICD-10-CM

## 2016-01-12 NOTE — Telephone Encounter (Signed)
Pt called and states that she has a tooth ache and it  has been going on since Sunday. Pt has the orange card and would like to be referred to a Dentist. Please advise provider. Thanks

## 2016-01-13 NOTE — Telephone Encounter (Signed)
I see his referral on the sickle cell workque they would be able to do it there . Thanks

## 2016-01-15 MED FILL — ?METOPROLOL 25 MG TABLET: 25 | 30 days supply | Qty: 60 | Fill #0

## 2016-01-15 MED FILL — ISOSORBIDE MN ER 30 MG TAB: 30 | 30 days supply | Qty: 15 | Fill #4

## 2016-01-21 ENCOUNTER — Encounter (HOSPITAL_COMMUNITY)
Admission: RE | Admit: 2016-01-21 | Discharge: 2016-01-21 | Disposition: A | Discharge status: Home / Self Care | Payer: Medicaid Other | Source: Ambulatory Visit | Attending: Obstetrics & Gynecology | Admitting: Obstetrics & Gynecology

## 2016-01-21 ENCOUNTER — Encounter (HOSPITAL_COMMUNITY): Payer: Self-pay

## 2016-01-21 DIAGNOSIS — Z01812 Encounter for preprocedural laboratory examination: Secondary | ICD-10-CM | POA: Diagnosis present

## 2016-01-21 HISTORY — DX: Nontoxic single thyroid nodule: E04.1

## 2016-01-21 HISTORY — DX: Reserved for inherently not codable concepts without codable children: IMO0001

## 2016-01-21 LAB — BASIC METABOLIC PANEL
ANION GAP: 3 — AB (ref 5–15)
BUN: 9 mg/dL (ref 6–20)
CHLORIDE: 108 mmol/L (ref 101–111)
CO2: 26 mmol/L (ref 22–32)
Calcium: 8.9 mg/dL (ref 8.9–10.3)
Creatinine, Ser: 1 mg/dL (ref 0.44–1.00)
GFR calc Af Amer: >60 mL/min (ref >60)
Glucose, Bld: 97 mg/dL (ref 65–99)
POTASSIUM: 3.8 mmol/L (ref 3.5–5.1)
SODIUM: 137 mmol/L (ref 135–145)

## 2016-01-21 LAB — CBC
HCT: 35.2 % — ABNORMAL LOW (ref 36.0–46.0)
HEMOGLOBIN: 11.1 g/dL — AB (ref 12.0–15.0)
MCH: 28.8 pg (ref 26.0–34.0)
MCHC: 31.5 g/dL (ref 30.0–36.0)
MCV: 91.2 fL (ref 78.0–100.0)
PLATELETS: 215 10*3/uL (ref 150–400)
RBC: 3.86 MIL/uL — AB (ref 3.87–5.11)
RDW: 16.1 % — ABNORMAL HIGH (ref 11.5–15.5)
WBC: 5.8 10*3/uL (ref 4.0–10.5)

## 2016-01-21 LAB — TYPE AND SCREEN
ABO/RH(D): O POS
ANTIBODY SCREEN: NEGATIVE

## 2016-01-21 LAB — ABO/RH: ABO/RH(D): O POS

## 2016-01-21 NOTE — Patient Instructions (Addendum)
Your procedure is scheduled on:  Tuesday, February 02, 2016  Enter through the Main Entrance of [facility] at:  10:30 AM  Pick up the phone at the desk and dial [phone].  Call this number if you have problems the morning of surgery: [phone].  Remember:  Do NOT eat food:  After Midnight Monday, February 01, 2016  Do NOT drink clear liquids after:  8:00 AM day of surgery  Take these medicines the morning of surgery with a SIP OF WATER:  Isosorbide, Lisinopril, Metoprolol  Do not take evening dose of Metformin the day before your surgery.  Do NOT wear jewelry (body piercing), metal hair clips/bobby pins, make-up, or nail polish. Do NOT wear lotions, powders, or perfumes.  You may wear deoderant. Do NOT shave for 48 hours prior to surgery. Do NOT bring valuables to the hospital. Contacts, dentures, or bridgework may not be worn into surgery.  Leave suitcase in car.  After surgery it may be brought to your room.  For patients admitted to the hospital, checkout time is 11:00 AM the day of discharge.

## 2016-01-22 MED FILL — NITROSTAT 0.4 MG TABLET SL: 0.4 | 25 days supply | Qty: 25 | Fill #1

## 2016-01-22 NOTE — Telephone Encounter (Signed)
Referral will be sent through [facility]que

## 2016-01-25 MED FILL — LISINOPRIL 2.5 MG TABLET: 2.5 | 30 days supply | Qty: 30 | Fill #1

## 2016-02-02 ENCOUNTER — Encounter (HOSPITAL_COMMUNITY)
Admission: RE | Disposition: A | Discharge status: Home / Self Care | Payer: Self-pay | Source: Ambulatory Visit | Attending: Obstetrics & Gynecology

## 2016-02-02 ENCOUNTER — Encounter (HOSPITAL_COMMUNITY): Payer: Self-pay | Admitting: Registered Nurse

## 2016-02-02 ENCOUNTER — Inpatient Hospital Stay (HOSPITAL_COMMUNITY): Payer: Medicaid Other | Admitting: Registered Nurse

## 2016-02-02 ENCOUNTER — Inpatient Hospital Stay (HOSPITAL_COMMUNITY)
Admission: RE | Admit: 2016-02-02 | Discharge: 2016-02-04 | DRG: 743 | Disposition: A | Discharge status: Home / Self Care | Payer: Medicaid Other | Source: Ambulatory Visit | Attending: Obstetrics & Gynecology | Admitting: Obstetrics & Gynecology

## 2016-02-02 DIAGNOSIS — D259 Leiomyoma of uterus, unspecified: Secondary | ICD-10-CM | POA: Diagnosis present

## 2016-02-02 DIAGNOSIS — N938 Other specified abnormal uterine and vaginal bleeding: Secondary | ICD-10-CM | POA: Diagnosis present

## 2016-02-02 DIAGNOSIS — D252 Subserosal leiomyoma of uterus: Secondary | ICD-10-CM | POA: Diagnosis present

## 2016-02-02 DIAGNOSIS — Z7984 Long term (current) use of oral hypoglycemic drugs: Secondary | ICD-10-CM

## 2016-02-02 DIAGNOSIS — Z833 Family history of diabetes mellitus: Secondary | ICD-10-CM | POA: Diagnosis not present

## 2016-02-02 DIAGNOSIS — Z825 Family history of asthma and other chronic lower respiratory diseases: Secondary | ICD-10-CM

## 2016-02-02 DIAGNOSIS — E785 Hyperlipidemia, unspecified: Secondary | ICD-10-CM | POA: Diagnosis present

## 2016-02-02 DIAGNOSIS — K219 Gastro-esophageal reflux disease without esophagitis: Secondary | ICD-10-CM | POA: Diagnosis present

## 2016-02-02 DIAGNOSIS — Z823 Family history of stroke: Secondary | ICD-10-CM

## 2016-02-02 DIAGNOSIS — Z8249 Family history of ischemic heart disease and other diseases of the circulatory system: Secondary | ICD-10-CM

## 2016-02-02 DIAGNOSIS — E1121 Type 2 diabetes mellitus with diabetic nephropathy: Secondary | ICD-10-CM

## 2016-02-02 DIAGNOSIS — Z7982 Long term (current) use of aspirin: Secondary | ICD-10-CM

## 2016-02-02 DIAGNOSIS — D251 Intramural leiomyoma of uterus: Principal | ICD-10-CM | POA: Diagnosis present

## 2016-02-02 DIAGNOSIS — I2510 Atherosclerotic heart disease of native coronary artery without angina pectoris: Secondary | ICD-10-CM | POA: Diagnosis present

## 2016-02-02 DIAGNOSIS — I509 Heart failure, unspecified: Secondary | ICD-10-CM | POA: Diagnosis present

## 2016-02-02 DIAGNOSIS — Z794 Long term (current) use of insulin: Secondary | ICD-10-CM

## 2016-02-02 DIAGNOSIS — D649 Anemia, unspecified: Secondary | ICD-10-CM | POA: Diagnosis present

## 2016-02-02 DIAGNOSIS — I110 Hypertensive heart disease with heart failure: Secondary | ICD-10-CM | POA: Diagnosis present

## 2016-02-02 DIAGNOSIS — E119 Type 2 diabetes mellitus without complications: Secondary | ICD-10-CM | POA: Diagnosis present

## 2016-02-02 DIAGNOSIS — N920 Excessive and frequent menstruation with regular cycle: Secondary | ICD-10-CM | POA: Diagnosis present

## 2016-02-02 DIAGNOSIS — I252 Old myocardial infarction: Secondary | ICD-10-CM

## 2016-02-02 HISTORY — PX: ABDOMINAL HYSTERECTOMY: SHX81

## 2016-02-02 LAB — PREGNANCY, URINE: PREG TEST UR: NEGATIVE

## 2016-02-02 LAB — GLUCOSE, CAPILLARY
GLUCOSE-CAPILLARY: 128 mg/dL — AB (ref 65–99)
GLUCOSE-CAPILLARY: 151 mg/dL — AB (ref 65–99)

## 2016-02-02 SURGERY — HYSTERECTOMY, ABDOMINAL
Anesthesia: General | Site: Abdomen

## 2016-02-02 MED ORDER — SODIUM CHLORIDE 0.9% FLUSH: 9.0000 mL | INTRAVENOUS | Status: DC | PRN

## 2016-02-02 MED ORDER — HYDROMORPHONE HCL 1 MG/ML IJ SOLN
INTRAMUSCULAR | Status: DC | PRN
  Administered 2016-02-02: 1 mg via INTRAVENOUS

## 2016-02-02 MED ORDER — METOPROLOL TARTRATE 25 MG PO TABS
25.0000 mg | ORAL_TABLET | Freq: Two times a day (BID) | ORAL | Status: DC
  Administered 2016-02-02 – 2016-02-03 (×2): 25 mg via ORAL
  Filled 2016-02-02 (×4): qty 1

## 2016-02-02 MED ORDER — FENTANYL CITRATE (PF) 100 MCG/2ML IJ SOLN
INTRAMUSCULAR | Status: DC | PRN
  Administered 2016-02-02: 50 ug via INTRAVENOUS
  Administered 2016-02-02: 100 ug via INTRAVENOUS
  Administered 2016-02-02: 150 ug via INTRAVENOUS
  Administered 2016-02-02: 100 ug via INTRAVENOUS
  Administered 2016-02-02: 50 ug via INTRAVENOUS

## 2016-02-02 MED ORDER — SCOPOLAMINE 1 MG/3DAYS TD PT72
1.0000 | MEDICATED_PATCH | Freq: Once | TRANSDERMAL | Status: DC
  Administered 2016-02-02: 1.5 mg via TRANSDERMAL

## 2016-02-02 MED ORDER — NALOXONE HCL 0.4 MG/ML IJ SOLN: 0.4000 mg | INTRAMUSCULAR | Status: DC | PRN

## 2016-02-02 MED ORDER — LISINOPRIL 2.5 MG PO TABS
2.5000 mg | ORAL_TABLET | Freq: Every day | ORAL | Status: DC
  Filled 2016-02-02 (×2): qty 1

## 2016-02-02 MED ORDER — MIDAZOLAM HCL 2 MG/2ML IJ SOLN
INTRAMUSCULAR | Status: AC
  Filled 2016-02-02: qty 2

## 2016-02-02 MED ORDER — ONDANSETRON HCL 4 MG/2ML IJ SOLN
INTRAMUSCULAR | Status: DC | PRN
  Administered 2016-02-02: 4 mg via INTRAVENOUS

## 2016-02-02 MED ORDER — HYDROMORPHONE 1 MG/ML IV SOLN
INTRAVENOUS | Status: DC
  Administered 2016-02-02: 2.4 mg via INTRAVENOUS
  Administered 2016-02-02: 16:00:00 via INTRAVENOUS
  Administered 2016-02-03 (×2): 0.6 mg via INTRAVENOUS
  Administered 2016-02-03: 0.9 mg via INTRAVENOUS
  Administered 2016-02-03: 0.6 mg via INTRAVENOUS
  Filled 2016-02-02: qty 25

## 2016-02-02 MED ORDER — CEFAZOLIN SODIUM-DEXTROSE 2-3 GM-% IV SOLR
INTRAVENOUS | Status: AC
  Filled 2016-02-02: qty 50

## 2016-02-02 MED ORDER — SCOPOLAMINE 1 MG/3DAYS TD PT72
MEDICATED_PATCH | TRANSDERMAL | Status: AC
  Administered 2016-02-02: 1.5 mg via TRANSDERMAL
  Filled 2016-02-02: qty 1

## 2016-02-02 MED ORDER — LACTATED RINGERS IV SOLN
INTRAVENOUS | Status: DC
  Administered 2016-02-02: 100 mL/h via INTRAVENOUS

## 2016-02-02 MED ORDER — BUPIVACAINE HCL (PF) 0.25 % IJ SOLN
INTRAMUSCULAR | Status: DC | PRN
  Administered 2016-02-02: 30 mL

## 2016-02-02 MED ORDER — DIPHENHYDRAMINE HCL 50 MG/ML IJ SOLN: 12.5000 mg | Freq: Four times a day (QID) | INTRAMUSCULAR | Status: DC | PRN

## 2016-02-02 MED ORDER — ONDANSETRON HCL 4 MG/2ML IJ SOLN: 4.0000 mg | Freq: Four times a day (QID) | INTRAMUSCULAR | Status: DC | PRN

## 2016-02-02 MED ORDER — ISOSORBIDE MONONITRATE 15 MG HALF TABLET
15.0000 mg | ORAL_TABLET | Freq: Every day | ORAL | Status: DC
  Filled 2016-02-02 (×2): qty 1

## 2016-02-02 MED ORDER — MIDAZOLAM HCL 5 MG/5ML IJ SOLN
INTRAMUSCULAR | Status: DC | PRN
  Administered 2016-02-02: 2 mg via INTRAVENOUS

## 2016-02-02 MED ORDER — FENTANYL CITRATE (PF) 250 MCG/5ML IJ SOLN
INTRAMUSCULAR | Status: AC
  Filled 2016-02-02: qty 5

## 2016-02-02 MED ORDER — SUGAMMADEX SODIUM 200 MG/2ML IV SOLN
INTRAVENOUS | Status: DC | PRN
  Administered 2016-02-02: 200 mg via INTRAVENOUS

## 2016-02-02 MED ORDER — DIPHENHYDRAMINE HCL 12.5 MG/5ML PO ELIX: 12.5000 mg | ORAL_SOLUTION | Freq: Four times a day (QID) | ORAL | Status: DC | PRN

## 2016-02-02 MED ORDER — LACTATED RINGERS IV SOLN
INTRAVENOUS | Status: DC
  Administered 2016-02-02 (×2): via INTRAVENOUS
  Administered 2016-02-02: 125 mL/h via INTRAVENOUS

## 2016-02-02 MED ORDER — INSULIN ASPART 100 UNIT/ML ~~LOC~~ SOLN: 0.0000 [IU] | Freq: Three times a day (TID) | SUBCUTANEOUS | Status: DC

## 2016-02-02 MED ORDER — KETOROLAC TROMETHAMINE 30 MG/ML IJ SOLN
30.0000 mg | Freq: Four times a day (QID) | INTRAMUSCULAR | Status: DC
  Administered 2016-02-02 – 2016-02-03 (×5): 30 mg via INTRAVENOUS
  Filled 2016-02-02 (×5): qty 1

## 2016-02-02 MED ORDER — ONDANSETRON HCL 4 MG/2ML IJ SOLN
INTRAMUSCULAR | Status: AC
  Filled 2016-02-02: qty 2

## 2016-02-02 MED ORDER — BUPIVACAINE HCL (PF) 0.25 % IJ SOLN
INTRAMUSCULAR | Status: AC
  Filled 2016-02-02: qty 30

## 2016-02-02 MED ORDER — LACTATED RINGERS IV SOLN: INTRAVENOUS | Status: DC

## 2016-02-02 MED ORDER — PROPOFOL 10 MG/ML IV BOLUS
INTRAVENOUS | Status: AC
  Filled 2016-02-02: qty 20

## 2016-02-02 MED ORDER — SUGAMMADEX SODIUM 200 MG/2ML IV SOLN
INTRAVENOUS | Status: AC
  Filled 2016-02-02: qty 2

## 2016-02-02 MED ORDER — LIDOCAINE HCL (CARDIAC) 20 MG/ML IV SOLN
INTRAVENOUS | Status: AC
  Filled 2016-02-02: qty 5

## 2016-02-02 MED ORDER — PROPOFOL 10 MG/ML IV BOLUS
INTRAVENOUS | Status: AC
Start: 2016-02-02 — End: 2016-02-02
  Filled 2016-02-02: qty 20

## 2016-02-02 MED ORDER — DEXAMETHASONE SODIUM PHOSPHATE 4 MG/ML IJ SOLN
INTRAMUSCULAR | Status: AC
  Filled 2016-02-02: qty 1

## 2016-02-02 MED ORDER — KETOROLAC TROMETHAMINE 30 MG/ML IJ SOLN: 30.0000 mg | Freq: Four times a day (QID) | INTRAMUSCULAR | Status: DC

## 2016-02-02 MED ORDER — ATORVASTATIN CALCIUM 80 MG PO TABS
80.0000 mg | ORAL_TABLET | Freq: Every day | ORAL | Status: DC
  Administered 2016-02-03: 80 mg via ORAL
  Filled 2016-02-02 (×2): qty 1

## 2016-02-02 MED ORDER — ROCURONIUM BROMIDE 100 MG/10ML IV SOLN
INTRAVENOUS | Status: AC
  Filled 2016-02-02: qty 1

## 2016-02-02 MED ORDER — ROCURONIUM BROMIDE 100 MG/10ML IV SOLN
INTRAVENOUS | Status: DC | PRN
  Administered 2016-02-02: 60 mg via INTRAVENOUS

## 2016-02-02 MED ORDER — DEXTROSE 5 % IV SOLN
2.0000 g | INTRAVENOUS | Status: AC
  Administered 2016-02-02: 2 g via INTRAVENOUS
  Filled 2016-02-02: qty 20

## 2016-02-02 MED ORDER — ONDANSETRON HCL 4 MG PO TABS: 4.0000 mg | ORAL_TABLET | Freq: Four times a day (QID) | ORAL | Status: DC | PRN

## 2016-02-02 MED ORDER — LIDOCAINE HCL (CARDIAC) 20 MG/ML IV SOLN
INTRAVENOUS | Status: DC | PRN
  Administered 2016-02-02: 50 mg via INTRAVENOUS

## 2016-02-02 MED ORDER — NITROGLYCERIN 0.4 MG SL SUBL: 0.4000 mg | SUBLINGUAL_TABLET | SUBLINGUAL | Status: DC | PRN

## 2016-02-02 MED ORDER — OXYCODONE-ACETAMINOPHEN 5-325 MG PO TABS
1.0000 | ORAL_TABLET | ORAL | Status: DC | PRN
  Administered 2016-02-03 – 2016-02-04 (×3): 1 via ORAL
  Filled 2016-02-02 (×3): qty 1

## 2016-02-02 MED ORDER — PROPOFOL 10 MG/ML IV BOLUS
INTRAVENOUS | Status: DC | PRN
  Administered 2016-02-02: 200 mg via INTRAVENOUS

## 2016-02-02 MED ORDER — DEXAMETHASONE SODIUM PHOSPHATE 4 MG/ML IJ SOLN
INTRAMUSCULAR | Status: DC | PRN
  Administered 2016-02-02: 4 mg via INTRAVENOUS

## 2016-02-02 MED ORDER — HYDROMORPHONE HCL 1 MG/ML IJ SOLN
INTRAMUSCULAR | Status: AC
Start: 2016-02-02 — End: 2016-02-02
  Filled 2016-02-02: qty 1

## 2016-02-02 SURGICAL SUPPLY — 53 items
BAG SPEC RTRVL LRG 6X4 10 (ENDOMECHANICALS)
CABLE HIGH FREQUENCY MONO STRZ (ELECTRODE) IMPLANT
CANISTER SUCT 3000ML (MISCELLANEOUS) ×4 IMPLANT
CHLORAPREP W/TINT 26ML (MISCELLANEOUS) ×4 IMPLANT
CLOSURE WOUND 1/2 X4 (GAUZE/BANDAGES/DRESSINGS)
CLOTH BEACON ORANGE TIMEOUT ST (SAFETY) ×4 IMPLANT
CONT PATH 16OZ SNAP LID 3702 (MISCELLANEOUS) ×4 IMPLANT
DRAPE WARM FLUID 44X44 (DRAPE) IMPLANT
DRSG COVADERM PLUS 2X2 (GAUZE/BANDAGES/DRESSINGS) ×8 IMPLANT
DRSG OPSITE POSTOP 3X4 (GAUZE/BANDAGES/DRESSINGS) IMPLANT
DRSG OPSITE POSTOP 4X10 (GAUZE/BANDAGES/DRESSINGS) ×4 IMPLANT
DURAPREP 26ML APPLICATOR (WOUND CARE) ×4 IMPLANT
GAUZE SPONGE 4X4 16PLY XRAY LF (GAUZE/BANDAGES/DRESSINGS) IMPLANT
GLOVE BIO SURGEON STRL SZ 6.5 (GLOVE) ×3 IMPLANT
GLOVE BIO SURGEONS STRL SZ 6.5 (GLOVE) ×1
GLOVE BIOGEL PI IND STRL 7.0 (GLOVE) ×8 IMPLANT
GLOVE BIOGEL PI INDICATOR 7.0 (GLOVE) ×8
GOWN STRL REUS W/TWL LRG LVL3 (GOWN DISPOSABLE) ×12 IMPLANT
HEMOSTAT ARISTA ABSORB 3G PWDR (MISCELLANEOUS) ×3 IMPLANT
LIQUID BAND (GAUZE/BANDAGES/DRESSINGS) IMPLANT
NEEDLE HYPO 22GX1.5 SAFETY (NEEDLE) ×4 IMPLANT
NEEDLE INSUFFLATION 120MM (ENDOMECHANICALS) ×4 IMPLANT
NS IRRIG 1000ML POUR BTL (IV SOLUTION) ×4 IMPLANT
PACK ABDOMINAL GYN (CUSTOM PROCEDURE TRAY) ×4 IMPLANT
PACK LAPAROSCOPY BASIN (CUSTOM PROCEDURE TRAY) ×4 IMPLANT
PAD OB MATERNITY 4.3X12.25 (PERSONAL CARE ITEMS) ×4 IMPLANT
PAD TRENDELENBURG POSITION (MISCELLANEOUS) ×4 IMPLANT
PENCIL SMOKE EVAC W/HOLSTER (ELECTROSURGICAL) ×4 IMPLANT
POUCH SPECIMEN RETRIEVAL 10MM (ENDOMECHANICALS) IMPLANT
SET IRRIG TUBING LAPAROSCOPIC (IRRIGATION / IRRIGATOR) IMPLANT
SHEARS HARMONIC ACE PLUS 36CM (ENDOMECHANICALS) IMPLANT
SLEEVE XCEL OPT CAN 5 100 (ENDOMECHANICALS) IMPLANT
SPONGE LAP 18X18 X RAY DECT (DISPOSABLE) ×8 IMPLANT
STAPLER VISISTAT 35W (STAPLE) IMPLANT
STRIP CLOSURE SKIN 1/2X4 (GAUZE/BANDAGES/DRESSINGS) IMPLANT
SUT VIC AB 0 CT1 18XCR BRD8 (SUTURE) ×8 IMPLANT
SUT VIC AB 0 CT1 27 (SUTURE) ×4
SUT VIC AB 0 CT1 27XBRD ANBCTR (SUTURE) ×2 IMPLANT
SUT VIC AB 0 CT1 36 (SUTURE) ×8 IMPLANT
SUT VIC AB 0 CT1 8-18 (SUTURE) ×16
SUT VIC AB 2-0 CT1 27 (SUTURE) ×4
SUT VIC AB 2-0 CT1 TAPERPNT 27 (SUTURE) ×2 IMPLANT
SUT VIC AB 4-0 PS2 27 (SUTURE) IMPLANT
SUT VICRYL 0 TIES 12 18 (SUTURE) ×4 IMPLANT
SUT VICRYL 0 UR6 27IN ABS (SUTURE) ×4 IMPLANT
SUT VICRYL 4-0 PS2 18IN ABS (SUTURE) ×4 IMPLANT
SYR CONTROL 10ML LL (SYRINGE) ×4 IMPLANT
TOWEL OR 17X24 6PK STRL BLUE (TOWEL DISPOSABLE) ×8 IMPLANT
TRAY FOLEY CATH SILVER 14FR (SET/KITS/TRAYS/PACK) ×4 IMPLANT
TROCAR XCEL DIL TIP R 11M (ENDOMECHANICALS) ×4 IMPLANT
TROCAR XCEL NON-BLD 5MMX100MML (ENDOMECHANICALS) ×4 IMPLANT
WARMER LAPAROSCOPE (MISCELLANEOUS) ×4 IMPLANT
WATER STERILE IRR 1000ML POUR (IV SOLUTION) ×4 IMPLANT

## 2016-02-02 NOTE — Anesthesia Procedure Notes (Signed)
Procedure Name: Intubation Date/Time: 02/02/2016 12:10 PM Performed by: Talbot Grumbling Pre-anesthesia Checklist: Patient identified, Emergency Drugs available, Suction available and Patient being monitored Patient Re-evaluated:Patient Re-evaluated prior to inductionOxygen Delivery Method: Circle system utilized Preoxygenation: Pre-oxygenation with 100% oxygen Intubation Type: IV induction Ventilation: Mask ventilation without difficulty Laryngoscope Size: Mac and 3 Grade View: Grade II Tube type: Oral Tube size: 7.0 mm Number of attempts: 1 Airway Equipment and Method: Stylet Placement Confirmation: ETT inserted through vocal cords under direct vision,  positive ETCO2 and breath sounds checked- equal and bilateral Secured at: 21 cm Tube secured with: Tape Dental Injury: Teeth and Oropharynx as per pre-operative assessment

## 2016-02-02 NOTE — Op Note (Signed)
Virginia [name] PROCEDURE DATE: 02/02/2016  PREOPERATIVE DIAGNOSES:  Symptomatic fibroids, abnormal uterine bleeding POSTOPERATIVE DIAGNOSES:  The same SURGEON:   Woodroe Mode, MD  ASSISTANT: Lavonia Drafts, M.D. OPERATION:  Total abdominal hysterectomy,  Bilateral Salpingoohorectomy ANESTHESIA:  General endotracheal.  INDICATIONS: The patient is a 55 y.o. [id number] with the aforementioned diagnoses who desires definitive surgical management. On the preoperative visit, the risks, benefits, indications, and alternatives of the procedure were reviewed with the patient.  On the day of surgery, the risks of surgery were again discussed with the patient including but not limited to: bleeding which may require transfusion or reoperation; infection which may require antibiotics; injury to bowel, bladder, ureters or other surrounding organs; need for additional procedures; thromboembolic phenomenon, incisional problems and other postoperative/anesthesia complications. Written informed consent was obtained.    OPERATIVE FINDINGS: A 12 week size uterus with normal tubes and ovaries bilaterally and pelvic adhesions  ESTIMATED BLOOD LOSS: 200 ml FLUIDS:  1500 ml of Lactated Ringers URINE OUTPUT:  100 ml of clear yellow urine. SPECIMENS:  Uterus,cervix,  bilateral fallopian tubes and ovaries sent to pathology COMPLICATIONS:  None immediate.   DESCRIPTION OF PROCEDURE:  The patient received intravenous antibiotics and had sequential compression devices applied to her lower extremities while in the preoperative area.   She was taken to the operating room and placed under general anesthesia without difficulty.The abdomen and perineum were prepped and draped in a sterile manner, and she was placed in a dorsal supine position.  A Foley catheter was inserted into the bladder and attached to constant drainage. After an adequate timeout was performed, a Pfannensteil skin incision was made. This incision was  taken down to the fascia using electrocautery with care given to maintain good hemostasis. The fascia was incised in the midline and the fascial incision was then extended bilaterally using electrocautery without difficulty. The fascia was then dissected off the underlying rectus muscles using blunt and sharp dissection. The rectus muscles were split bluntly in the midline and the peritoneum entered sharply without complication. This peritoneal incision was then extended superiorly and inferiorly with care given to prevent bowel or bladder injury. Attention was then turned to the pelvis. The bowel was packed away with moist laparotomy sponges. Omental adhesion to the fundus was clamped, cut and ligated.The uterus at this point was noted to be mobilized and was delivered up out of the abdomen.  The round ligaments on each side were clamped, suture ligated with 0 Vicryl, and transected with electrocautery allowing entry into the broad ligament. Of note, all sutures used in this procedure are 0 Vicryl unless otherwise noted. The anterior and posterior leaves of the broad ligament were separated, and the ureters were inspected to be safely away from the area of dissection bilaterally. A hole was created in the clear portion of the posterior broad ligament and the infundibulopelvic ligament clamped on the patient's right side. This pedicle was then clamped, cut, and doubly suture ligated with good hemostasis.  This procedure was repeated in an identical fashion on the opposite side, incorporating both ovaries and tubes in the pathology specimen, and allowing salpingoophorectomy.  A bladder flap was then created.  The bladder was then bluntly dissected off the lower uterine segment and cervix with good hemostasis noted. The uterine arteries were then skeletonized bilaterally and then clamped, cut, and doubly suture ligated with care given to prevent ureteral injury.    The uterosacral ligaments were then clamped, cut, and  ligated bilaterally.  Finally, the cardinal ligaments were clamped, cut, and ligated bilaterally.  Acutely curved clamps were placed across the vagina just under the cervix, and the specimen was amputated and sent to pathology. The vaginal cuff angles were closed with Heaney stiches with care given to incorporate the uterosacral-cardinal ligament pedicles on both sides. The middle of the vaginal cuff was closed with a series of interrupted figure-of-eight sutures with care given to incorporate the anterior pubocervical fascia and the posterior rectovaginal fascia.   The pelvis was irrigated and hemostasis was reconfirmed at all pedicles and along the pelvic sidewall.  Arista was placed in the pelvis for better hemostasis.The ureters were inspected and noted to be peristalsing bilaterally.  All laparotomy sponges and instruments were removed from the abdomen. The peritoneum was closed with a running stitch, and the fascia was also closed in a running fashion.  The skin was closed with a 4-0 Vicryl subcuticular stitch. Sponge, lap, needle, and instrument counts were correct times two. The patient was taken to the recovery area awake, extubated and in stable condition.  Woodroe Mode, MD  Attending [place], [place] 02/02/2016 1:55 PM

## 2016-02-02 NOTE — Transfer of Care (Signed)
Immediate Anesthesia Transfer of Care Note  Patient: Virginia [name]  Procedure(s) Performed: Procedure(s): HYSTERECTOMY ABDOMINAL WITH BILATERAL SALPINGECTOMY (N/A)  Patient Location: PACU  Anesthesia Type:General  Level of Consciousness: sedated  Airway & Oxygen Therapy: Patient Spontanous Breathing and Patient connected to nasal cannula oxygen  Post-op Assessment: Report given to RN  Post vital signs: Reviewed  Last Vitals:  Filed Vitals:   02/02/16 1041  BP: 120/75  Pulse: 70  Temp: 36.7 C  Resp: 20    Complications: No apparent anesthesia complications

## 2016-02-02 NOTE — Anesthesia Preprocedure Evaluation (Signed)
Anesthesia Evaluation    Reviewed: Allergy & Precautions, H&P , Patient's Chart, lab work & pertinent test results, reviewed documented beta blocker date and time   Airway Mallampati: I  TM Distance: >3 FB Neck ROM: full    Dental no notable dental hx. (+) Teeth Intact   Pulmonary    Pulmonary exam normal        Cardiovascular hypertension, Normal cardiovascular exam     Neuro/Psych negative neurological ROS  negative psych ROS   GI/Hepatic Neg liver ROS,   Endo/Other  negative endocrine ROSdiabetes  Renal/GU      Musculoskeletal   Abdominal Normal abdominal exam  (+)   Peds  Hematology   Anesthesia Other Findings   Reproductive/Obstetrics negative OB ROS                             Anesthesia Physical Anesthesia Plan  ASA: II  Anesthesia Plan: General   Post-op Pain Management:    Induction: Intravenous  Airway Management Planned: Oral ETT  Additional Equipment:   Intra-op Plan:   Post-operative Plan: Extubation in OR  Informed Consent: I have reviewed the patients History and Physical, chart, labs and discussed the procedure including the risks, benefits and alternatives for the proposed anesthesia with the patient or authorized representative who has indicated his/her understanding and acceptance.   Dental Advisory Given  Plan Discussed with: CRNA  Anesthesia Plan Comments:         Anesthesia Quick Evaluation

## 2016-02-02 NOTE — H&P (Signed)
HPI Virginia [name] is a 55 y.o. female. [id number]. H/O fibroid uterus and menorrhagia Wishes to have TAH/BSO, has medical clearance from cardiology. Takes 40 mg Megace BID HPI  Past Medical History  Diagnosis Date  . Hyperlipidemia with target LDL less than 70   . STEMI (ST elevation myocardial infarction) ([place]) 06/16/2015    BMS x 2 CFX  . Diabetes mellitus without complication ([place])   . Hypertension   . CHF (congestive heart failure) ([place])   . CAD (coronary artery disease)     a. 06/2015 BMS x2 to LCx. b. recurrent CP, cath 09/18/2015 patent stent, tiny diag stenosis medical therapy.   Marland Kitchen GERD (gastroesophageal reflux disease)     ?  Marland Kitchen Anemia     Past Surgical History  Procedure Laterality Date  . Appendectomy    . Cardiac catheterization N/A 06/17/2015    Procedure: Left Heart Cath and Coronary Angiography; Surgeon: Jettie Booze, MD; Location: [facility]; Service: Cardiovascular; Laterality: N/A;  . Cardiac catheterization  06/17/2015    Procedure: Coronary Stent Intervention; Surgeon: Jettie Booze, MD; Location: [facility]; Service: Cardiovascular;;  . Cardiac catheterization N/A 09/18/2015    Procedure: Left Heart Cath and Coronary Angiography; Surgeon: Troy Sine, MD; Location: [facility]; Service: Cardiovascular; Laterality: N/A;  . Esophagogastroduodenoscopy N/A 09/20/2015    Procedure: ESOPHAGOGASTRODUODENOSCOPY (EGD); Surgeon: Carol Ada, MD; Location: [facility]; Service: Endoscopy; Laterality: N/A;  . Colonoscopy N/A 09/20/2015    Procedure: COLONOSCOPY; Surgeon: Carol Ada, MD; Location: [facility]; Service: Endoscopy; Laterality: N/A;  . Colonoscopy    . Cesarean section      Myomectomy done during cesarean section    Family History  Problem Relation Age of Onset  . Stroke Mother   . Heart  disease Mother   . Diabetes Mother   . Cancer Father   . Hypertension Sister   . Hyperlipidemia Sister   . Heart attack Brother   . Hypertension Brother   . Heart attack Maternal Uncle   . Asthma Paternal Uncle   . Diabetes Paternal Uncle   . Thyroid disease Neg Hx   . Diabetes Paternal Aunt     Social History Social History  Substance Use Topics  . Smoking status: Never Smoker   . Smokeless tobacco: Never Used  . Alcohol Use: No    No Known Allergies  Current Outpatient Prescriptions  Medication Sig Dispense Refill  . aspirin EC 81 MG tablet Take 81 mg by mouth daily.    Marland Kitchen atorvastatin (LIPITOR) 80 MG tablet Take 1 tablet (80 mg total) by mouth daily at 6 PM. 90 tablet 3  . glimepiride (AMARYL) 2 MG tablet Take 1 tablet (2 mg total) by mouth daily before breakfast. 30 tablet 3  . isosorbide mononitrate (IMDUR) 30 MG 24 hr tablet Take 0.5 tablets (15 mg total) by mouth daily. 30 tablet 5  . lisinopril (PRINIVIL,ZESTRIL) 2.5 MG tablet Take 1 tablet (2.5 mg total) by mouth daily. 90 tablet 3  . megestrol (MEGACE) 40 MG tablet Take 2 tablets (80 mg total) by mouth 2 (two) times daily. 120 tablet 2  . metoprolol tartrate (LOPRESSOR) 25 MG tablet Take 1 tablet (25 mg total) by mouth 2 (two) times daily. 180 tablet 3  . nitroGLYCERIN (NITROSTAT) 0.4 MG SL tablet Place 1 tablet (0.4 mg total) under the tongue every 5 (five) minutes as needed for chest pain. 25 tablet 2  . pantoprazole (PROTONIX) 40 MG tablet Take  1 tablet (40 mg total) by mouth daily. 30 tablet 0  . nitrofurantoin, macrocrystal-monohydrate, (MACROBID) 100 MG capsule Take 1 capsule (100 mg total) by mouth 2 (two) times daily. (Patient not taking: Reported on 12/03/2015) 14 capsule 0   No current facility-administered medications for this visit.    Review of Systems Review of Systems  Constitutional:  Negative.  Genitourinary: Positive for vaginal bleeding (some clots), menstrual problem and pelvic pain.   Blood pressure 120/75, pulse 70, temperature 98.1 F (36.7 C), temperature source Oral, resp. rate 20, last menstrual period 10/21/2015, SpO2 100 %.    Physical Exam Physical Exam  Constitutional: She appears well-developed. No distress.  Cardiovascular: Normal rate.  Pulmonary/Chest: Effort normal.  Abdominal: Soft. She exhibits mass (12 week size uterus). There is no tenderness.  Psychiatric: She has a normal mood and affect. Her behavior is normal.    Data Reviewed Cardiology note, [place] report, biopsy result  Assessment    DUB, fibroid Patient Active Problem List   Diagnosis Date Noted  . Fibroid uterus 11/13/2015  . Transient right leg weakness 10/05/2015  . Acute GI bleeding   . Symptomatic anemia 09/19/2015  . Vasovagal near syncope 09/19/2015  . Dysfunctional uterine bleeding 09/19/2015  . Diarrhea due to drug 09/19/2015  . Heme positive stool 09/19/2015  . Chest pain 09/17/2015  . Multinodular goiter 07/09/2015  . CAD S/P CFX BMS (? med complinace) x 2 06/16/15 07/01/2015  . Acute renal insufficiency 07/01/2015  . Abdominal pain 07/01/2015  . Nodular goiter 06/23/2015  . Diabetes mellitus type 2, controlled ([place]) 06/23/2015  . STEMI -06/16/15 06/17/2015  . Hyperlipidemia with target LDL less than 70    Patient desires surgical management with TAH/BOS.  The risks of surgery were discussed in detail with the patient including but not limited to: bleeding which may require transfusion or reoperation; infection which may require prolonged hospitalization or re-hospitalization and antibiotic therapy; injury to bowel, bladder, ureters and major vessels or other surrounding organs; need for additional procedures including laparotomy; thromboembolic phenomenon, incisional problems and other postoperative or anesthesia  complications.  Patient was told that the likelihood that her condition and symptoms will be treated effectively with this surgical management was very high; the postoperative expectations were also discussed in detail. The patient also understands the alternative treatment options which were discussed in full. All questions were answered.      Plan    TAH/BSO as scheduled. Consent signed             Woodroe Mode, MD 02/02/2016 11:42 AM

## 2016-02-02 NOTE — Anesthesia Postprocedure Evaluation (Signed)
Anesthesia Post Note  Patient: Virginia [name]  Procedure(s) Performed: Procedure(s) (LRB): HYSTERECTOMY ABDOMINAL WITH BILATERAL SALPINGECTOMY (N/A)  Patient location during evaluation: PACU Anesthesia Type: General Level of consciousness: awake and alert Pain management: pain level controlled Vital Signs Assessment: post-procedure vital signs reviewed and stable Respiratory status: spontaneous breathing, nonlabored ventilation, respiratory function stable and patient connected to nasal cannula oxygen Cardiovascular status: blood pressure returned to baseline and stable Postop Assessment: no signs of nausea or vomiting Anesthetic complications: no    Last Vitals:  Filed Vitals:   02/02/16 1500 02/02/16 1515  BP: 146/88 145/80  Pulse: 69 67  Temp:    Resp: 11 13    Last Pain:  Filed Vitals:   02/02/16 1528  PainSc: 3                  Montez Hageman

## 2016-02-03 ENCOUNTER — Encounter (HOSPITAL_COMMUNITY): Payer: Self-pay | Admitting: Obstetrics & Gynecology

## 2016-02-03 LAB — CBC
HCT: 28 % — ABNORMAL LOW (ref 36.0–46.0)
HEMOGLOBIN: 9 g/dL — AB (ref 12.0–15.0)
MCH: 29.3 pg (ref 26.0–34.0)
MCHC: 32.1 g/dL (ref 30.0–36.0)
MCV: 91.2 fL (ref 78.0–100.0)
Platelets: 209 10*3/uL (ref 150–400)
RBC: 3.07 MIL/uL — ABNORMAL LOW (ref 3.87–5.11)
RDW: 16.2 % — AB (ref 11.5–15.5)
WBC: 7.7 10*3/uL (ref 4.0–10.5)

## 2016-02-03 LAB — COMPREHENSIVE METABOLIC PANEL
ALBUMIN: 3.6 g/dL (ref 3.5–5.0)
ALK PHOS: 55 U/L (ref 38–126)
ALT: 20 U/L (ref 14–54)
ANION GAP: 7 (ref 5–15)
AST: 25 U/L (ref 15–41)
BILIRUBIN TOTAL: 0.8 mg/dL (ref 0.3–1.2)
BUN: 12 mg/dL (ref 6–20)
CALCIUM: 8.7 mg/dL — AB (ref 8.9–10.3)
CO2: 28 mmol/L (ref 22–32)
Chloride: 102 mmol/L (ref 101–111)
Creatinine, Ser: 0.79 mg/dL (ref 0.44–1.00)
GFR calc non Af Amer: >60 mL/min (ref >60)
Glucose, Bld: 124 mg/dL — ABNORMAL HIGH (ref 65–99)
POTASSIUM: 4.3 mmol/L (ref 3.5–5.1)
SODIUM: 137 mmol/L (ref 135–145)
TOTAL PROTEIN: 6 g/dL — AB (ref 6.5–8.1)

## 2016-02-03 LAB — GLUCOSE, CAPILLARY
GLUCOSE-CAPILLARY: 103 mg/dL — AB (ref 65–99)
GLUCOSE-CAPILLARY: 105 mg/dL — AB (ref 65–99)
GLUCOSE-CAPILLARY: 94 mg/dL (ref 65–99)
Glucose-Capillary: 97 mg/dL (ref 65–99)
Glucose-Capillary: 125 mg/dL — ABNORMAL HIGH (ref 65–99)

## 2016-02-03 NOTE — Addendum Note (Signed)
Addendum  created 02/03/16 0800 by Talbot Grumbling, CRNA   Modules edited: Clinical Notes   Clinical Notes:  File: [id number]

## 2016-02-03 NOTE — Anesthesia Postprocedure Evaluation (Signed)
Anesthesia Post Note  Patient: VITALIA MACHAIN  Procedure(s) Performed: Procedure(s) (LRB): HYSTERECTOMY ABDOMINAL WITH BILATERAL SALPINGECTOMY (N/A)  Patient location during evaluation: [facility] Anesthesia Type: General Level of consciousness: awake and alert and oriented Pain management: pain level controlled Vital Signs Assessment: post-procedure vital signs reviewed and stable Respiratory status: spontaneous breathing and patient connected to nasal cannula oxygen Cardiovascular status: blood pressure returned to baseline Postop Assessment: no signs of nausea or vomiting and adequate PO intake Anesthetic complications: no    Last Vitals:  Filed Vitals:   02/03/16 0538 02/03/16 0542  BP:  122/62  Pulse:  69  Temp:  36.8 C  Resp: 8 12    Last Pain:  Filed Vitals:   02/03/16 0634  PainSc: 0-No pain                 Zyiere Rosemond

## 2016-02-03 NOTE — Progress Notes (Signed)
1 Day Post-Op Procedure(s) (LRB): HYSTERECTOMY ABDOMINAL WITH BILATERAL SALPINGECTOMY (N/A) and oophorectomy  Subjective: Patient reports incisional pain and + flatus.    Objective: I have reviewed patient's vital signs, intake and output and medications. Blood pressure 108/59, pulse 64, temperature 98.1 F (36.7 C), temperature source Oral, resp. rate 12, height 5\' 6"  (1.676 m), weight 169 lb (76.658 kg), last menstrual period 10/21/2015, SpO2 100 %.  General: alert, cooperative and no distress Resp: normal effort GI: soft, non-tender; bowel sounds normal; no masses,  no organomegaly and incision: clean and dry Extremities: extremities normal, atraumatic, no cyanosis or edema  Assessment: s/p Procedure(s): HYSTERECTOMY ABDOMINAL WITH BILATERAL SALPINGECTOMY (N/A): stable, progressing well and tolerating diet  Plan: Advance diet Encourage ambulation Advance to PO medication  LOS: 1 day    ARNOLD,JAMES 02/03/2016, 2:23 PM

## 2016-02-04 ENCOUNTER — Ambulatory Visit: Payer: No Typology Code available for payment source | Admitting: Interventional Cardiology

## 2016-02-04 LAB — GLUCOSE, CAPILLARY
GLUCOSE-CAPILLARY: 85 mg/dL (ref 65–99)
Glucose-Capillary: 122 mg/dL — ABNORMAL HIGH (ref 65–99)

## 2016-02-04 MED ORDER — OXYCODONE-ACETAMINOPHEN 5-325 MG PO TABS: 1.0000 | ORAL_TABLET | ORAL | Status: DC | PRN

## 2016-02-04 MED ORDER — DOCUSATE SODIUM 100 MG PO CAPS: 100.0000 mg | ORAL_CAPSULE | Freq: Two times a day (BID) | ORAL | Status: DC

## 2016-02-04 MED FILL — ATORVASTATIN 80 MG TABLET: 80 | 30 days supply | Qty: 30 | Fill #1

## 2016-02-04 NOTE — Discharge Instructions (Signed)
Abdominal Hysterectomy, Care After °Refer to this sheet in the next few weeks. These instructions provide you with information on caring for yourself after your procedure. Your health care provider may also give you more specific instructions. Your treatment has been planned according to current medical practices, but problems sometimes occur. Call your health care provider if you have any problems or questions after your procedure.  °WHAT TO EXPECT AFTER THE PROCEDURE °After your procedure, it is typical to have the following: °· Pain. °· Feeling tired. °· Poor appetite. °· Less interest in sex. °It takes 4-6 weeks to recover from this surgery.  °HOME CARE INSTRUCTIONS  °· Take pain medicines only as directed by your health care provider. Do not take over-the-counter pain medicines without checking with your health care provider first.  °· Change your bandage as directed by your health care provider. °· Return to your health care provider to have your sutures taken out. °· Take showers instead of baths for 2-3 weeks. Ask your health care provider when it is safe to start showering.  °· Do not douche, use tampons, or have sexual intercourse for at least 6 weeks or until your health care provider says you can.   °· Follow your health care provider's advice about exercise, lifting, driving, and general activities. °· Get plenty of rest and sleep.   °· Do not lift anything heavier than a gallon of milk (about 10 lb [4.5 kg]) for the first month after surgery. °· You can resume your normal diet if your health care provider says it is okay.   °· Do not drink alcohol until your health care provider says you can.   °· If you are constipated, ask your health care provider if you can take a mild laxative. °· Eating foods high in fiber may also help with constipation. Eat plenty of raw fruits and vegetables, whole grains, and beans. °· Drink enough fluids to keep your urine clear or pale yellow.   °· Try to have someone at  home with you for the first 1-2 weeks to help around the house. °· Keep all follow-up appointments. °SEEK MEDICAL CARE IF:  °· You have chills or fever. °· You have swelling, redness, or pain in the area of your incision that is getting worse.   °· You have pus coming from the incision.   °· You notice a bad smell coming from the incision or bandage.   °· Your incision breaks open.   °· You feel dizzy or light-headed.   °· You have pain or bleeding when you urinate.   °· You have persistent diarrhea.   °· You have persistent nausea and vomiting.   °· You have abnormal vaginal discharge.   °· You have a rash.   °· You have any type of abnormal reaction or develop an allergy to your medicine.   °· Your pain medicine is not helping.   °SEEK IMMEDIATE MEDICAL CARE IF:  °· You have a fever and your symptoms suddenly get worse. °· You have severe abdominal pain. °· You have chest pain. °· You have shortness of breath. °· You faint. °· You have pain, swelling, or redness of your leg. °· You have heavy vaginal bleeding with blood clots. °MAKE SURE YOU: °· Understand these instructions. °· Will watch your condition. °· Will get help right away if you are not doing well or get worse. °  °This information is not intended to replace advice given to you by your health care provider. Make sure you discuss any questions you have with your health care provider. °  °Document   Released: 05/13/2005 Document Revised: 11/14/2014 Document Reviewed: 08/16/2013 °Elsevier Interactive Patient Education ©2016 [employer] ° °

## 2016-02-04 NOTE — Progress Notes (Signed)
Patient discharged home with friend... Discharge instructions reviewed with patient and she verbalized understanding... Condition stable... No equipment... Taken to car via wheelchair by C. Ovid Curd, [place].

## 2016-02-04 NOTE — Discharge Summary (Signed)
Physician Discharge Summary  Patient ID: Virginia [name] [name]  Admit date: 02/02/2016 Discharge date: 02/04/2016  Admission Diagnoses: AUB, bifroids  Discharge Diagnoses:  Active Problems:   Dysfunctional uterine bleeding   Fibroid uterus   Discharged Condition: good  Hospital Course: Patient had an uncomplicated surgery; for further details of this surgery, please refer to the operative note. Furthermore, the patient had an uncomplicated postoperative course.  By time of discharge, her pain was controlled on oral pain medications; she was ambulating, voiding without difficulty, tolerating regular diet and passing flatus.  She was deemed stable for discharge to home.    Consults: None  Significant Diagnostic Studies: labs: CBC  Treatments: IV hydration, analgesia: Percocet and surgery: TAH/BSO  Discharge Exam: Blood pressure 114/54, pulse 89, temperature 98.7 F (37.1 C), temperature source Oral, resp. rate 16, height '5\' 6"'  (1.676 m), weight 169 lb (76.658 kg), last menstrual period 10/21/2015, SpO2 97 %.  I/O last 3 completed shifts: In: 4088.9 [P.O.:1920; I.V.:2168.9] Out: 2250 [Urine:2250]    General appearance: alert and no distress Resp: clear to auscultation bilaterally Cardio: regular rate and rhythm, S1, S2 normal, no murmur, click, rub or gallop GI: soft, non-tender; bowel sounds normal; no masses,  no organomegaly Extremities: extremities normal, atraumatic, no cyanosis or edema Incision/Wound:pressure dressing removed.  Honeycomb dressing let in place.   CBC    Component Value Date/Time   WBC 7.7 02/03/2016 0614   RBC 3.07* 02/03/2016 0614   RBC 3.10* 09/19/2015 1244   HGB 9.0* 02/03/2016 0614   HCT 28.0* 02/03/2016 0614   PLT 209 02/03/2016 0614   MCV 91.2 02/03/2016 0614   MCH 29.3 02/03/2016 0614   MCHC 32.1 02/03/2016 0614   RDW 16.2* 02/03/2016 0614   LYMPHSABS 2.0 12/29/2015 1106   MONOABS 0.4 12/29/2015 1106    EOSABS 0.2 12/29/2015 1106   BASOSABS 0.0 12/29/2015 1106     Disposition: 01-Home or Self Care  Discharge Instructions    Call MD for:  difficulty breathing, headache or visual disturbances    Complete by:  As directed      Call MD for:  extreme fatigue    Complete by:  As directed      Call MD for:  hives    Complete by:  As directed      Call MD for:  persistant dizziness or light-headedness    Complete by:  As directed      Call MD for:  persistant nausea and vomiting    Complete by:  As directed      Call MD for:  redness, tenderness, or signs of infection (pain, swelling, redness, odor or green/yellow discharge around incision site)    Complete by:  As directed      Call MD for:  severe uncontrolled pain    Complete by:  As directed      Call MD for:  temperature >100.4    Complete by:  As directed      Diet - low sodium heart healthy    Complete by:  As directed      Diet Carb Modified    Complete by:  As directed      Discharge wound care:    Complete by:  As directed   Remove outer dressing in 1 week     Driving Restrictions    Complete by:  As directed   No driving for 2 weeks     Increase activity slowly  Complete by:  As directed      Lifting restrictions    Complete by:  As directed   No lifting >20# for 4 weeks     Sexual Activity Restrictions    Complete by:  As directed   No sexual activity for 6 weeks            Medication List    STOP taking these medications        acetaminophen 500 MG tablet  Commonly known as:  TYLENOL     megestrol 40 MG tablet  Commonly known as:  MEGACE      TAKE these medications        aspirin EC 81 MG tablet  Take 81 mg by mouth daily.     atorvastatin 80 MG tablet  Commonly known as:  LIPITOR  Take 1 tablet (80 mg total) by mouth daily at 6 PM.     docusate sodium 100 MG capsule  Commonly known as:  COLACE  Take 1 capsule (100 mg total) by mouth 2 (two) times daily.     ferrous sulfate 325 (65 FE)  MG tablet  Take 1 tablet (325 mg total) by mouth daily with breakfast.     glucose blood test strip  Commonly known as:  TRUE METRIX BLOOD GLUCOSE TEST  Use as instructed     isosorbide mononitrate 30 MG 24 hr tablet  Commonly known as:  IMDUR  Take 0.5 tablets (15 mg total) by mouth daily.     lisinopril 2.5 MG tablet  Commonly known as:  PRINIVIL,ZESTRIL  Take 1 tablet (2.5 mg total) by mouth daily.     metFORMIN 500 MG tablet  Commonly known as:  GLUCOPHAGE  Take 1 tablet (500 mg total) by mouth 2 (two) times daily with a meal.     metoprolol tartrate 25 MG tablet  Commonly known as:  LOPRESSOR  Take 1 tablet (25 mg total) by mouth 2 (two) times daily.     nitroGLYCERIN 0.4 MG SL tablet  Commonly known as:  NITROSTAT  Place 1 tablet (0.4 mg total) under the tongue every 5 (five) minutes as needed for chest pain.     oxyCODONE-acetaminophen 5-325 MG tablet  Commonly known as:  PERCOCET/ROXICET  Take 1-2 tablets by mouth every 4 (four) hours as needed for severe pain (moderate to severe pain (when tolerating fluids)).     pantoprazole 40 MG tablet  Commonly known as:  PROTONIX  Take 1 tablet (40 mg total) by mouth daily.     TRUE METRIX METER w/Device Kit  Check BS once daily     TRUEPLUS LANCETS 28G Misc  Check BS once daily           Follow-up Information    Follow up with ARNOLD,JAMES, MD In 2 weeks.   Specialty:  Obstetrics and Gynecology   Contact information:   [place] [address] [phone]       Signed: Lavonia Drafts 02/04/2016, 8:30 AM

## 2016-02-05 ENCOUNTER — Telehealth: Payer: Self-pay | Admitting: *Deleted

## 2016-02-05 NOTE — Telephone Encounter (Signed)
MA not able to LVM due to no system being set up.  !!!Communication letter has been mailed out!!!

## 2016-02-05 NOTE — Telephone Encounter (Signed)
Patient verified DOB Patient is aware of A1C showing stable blood sugar. Patient advised to continue with metformin and diabetic diet. Patient is aware of hemoglobin being low, patient advised to keep appointment with gynecologist for surgery. Patient states she completed the surgery on Tuesday which just passed (02/02/16) Patient expressed her understanding and had no further questions or concerns at this time.  Communication letter was shredded.

## 2016-02-09 ENCOUNTER — Inpatient Hospital Stay (HOSPITAL_COMMUNITY)
Admission: AD | Admit: 2016-02-09 | Discharge: 2016-02-09 | Disposition: A | Discharge status: Home / Self Care | Payer: Medicaid Other | Source: Ambulatory Visit | Attending: Obstetrics & Gynecology | Admitting: Obstetrics & Gynecology

## 2016-02-09 ENCOUNTER — Encounter (HOSPITAL_COMMUNITY): Payer: Self-pay | Admitting: *Deleted

## 2016-02-09 DIAGNOSIS — R197 Diarrhea, unspecified: Secondary | ICD-10-CM | POA: Diagnosis not present

## 2016-02-09 DIAGNOSIS — R509 Fever, unspecified: Secondary | ICD-10-CM | POA: Diagnosis present

## 2016-02-09 DIAGNOSIS — Z7982 Long term (current) use of aspirin: Secondary | ICD-10-CM | POA: Insufficient documentation

## 2016-02-09 DIAGNOSIS — R112 Nausea with vomiting, unspecified: Secondary | ICD-10-CM | POA: Insufficient documentation

## 2016-02-09 DIAGNOSIS — Z7984 Long term (current) use of oral hypoglycemic drugs: Secondary | ICD-10-CM | POA: Insufficient documentation

## 2016-02-09 DIAGNOSIS — Z79899 Other long term (current) drug therapy: Secondary | ICD-10-CM | POA: Diagnosis not present

## 2016-02-09 DIAGNOSIS — K219 Gastro-esophageal reflux disease without esophagitis: Secondary | ICD-10-CM | POA: Insufficient documentation

## 2016-02-09 DIAGNOSIS — I10 Essential (primary) hypertension: Secondary | ICD-10-CM | POA: Diagnosis not present

## 2016-02-09 DIAGNOSIS — I252 Old myocardial infarction: Secondary | ICD-10-CM | POA: Diagnosis not present

## 2016-02-09 DIAGNOSIS — Z90710 Acquired absence of both cervix and uterus: Secondary | ICD-10-CM | POA: Insufficient documentation

## 2016-02-09 DIAGNOSIS — I2510 Atherosclerotic heart disease of native coronary artery without angina pectoris: Secondary | ICD-10-CM | POA: Diagnosis not present

## 2016-02-09 DIAGNOSIS — K529 Noninfective gastroenteritis and colitis, unspecified: Secondary | ICD-10-CM

## 2016-02-09 DIAGNOSIS — E119 Type 2 diabetes mellitus without complications: Secondary | ICD-10-CM | POA: Insufficient documentation

## 2016-02-09 DIAGNOSIS — E785 Hyperlipidemia, unspecified: Secondary | ICD-10-CM | POA: Diagnosis not present

## 2016-02-09 LAB — URINE MICROSCOPIC-ADD ON

## 2016-02-09 LAB — URINALYSIS, ROUTINE W REFLEX MICROSCOPIC
BILIRUBIN URINE: NEGATIVE
Glucose, UA: NEGATIVE mg/dL
KETONES UR: NEGATIVE mg/dL
LEUKOCYTES UA: NEGATIVE
NITRITE: NEGATIVE
PROTEIN: NEGATIVE mg/dL
Specific Gravity, Urine: 1.015 (ref 1.005–1.030)
pH: 5.5 (ref 5.0–8.0)

## 2016-02-09 LAB — CBC WITH DIFFERENTIAL/PLATELET
BASOS ABS: 0 10*3/uL (ref 0.0–0.1)
BASOS PCT: 0 %
EOS ABS: 0.2 10*3/uL (ref 0.0–0.7)
Eosinophils Relative: 2 %
HEMATOCRIT: 28.8 % — AB (ref 36.0–46.0)
HEMOGLOBIN: 9.2 g/dL — AB (ref 12.0–15.0)
Lymphocytes Relative: 20 %
Lymphs Abs: 1.9 10*3/uL (ref 0.7–4.0)
MCH: 29.2 pg (ref 26.0–34.0)
MCHC: 31.9 g/dL (ref 30.0–36.0)
MCV: 91.4 fL (ref 78.0–100.0)
MONO ABS: 1 10*3/uL (ref 0.1–1.0)
Monocytes Relative: 10 %
NEUTROS ABS: 6.7 10*3/uL (ref 1.7–7.7)
NEUTROS PCT: 68 %
Platelets: 291 10*3/uL (ref 150–400)
RBC: 3.15 MIL/uL — ABNORMAL LOW (ref 3.87–5.11)
RDW: 16.3 % — AB (ref 11.5–15.5)
WBC: 9.8 10*3/uL (ref 4.0–10.5)

## 2016-02-09 LAB — GLUCOSE, CAPILLARY: GLUCOSE-CAPILLARY: 84 mg/dL (ref 65–99)

## 2016-02-09 LAB — WET PREP, GENITAL
CLUE CELLS WET PREP: NONE SEEN
Sperm: NONE SEEN
TRICH WET PREP: NONE SEEN
Yeast Wet Prep HPF POC: NONE SEEN

## 2016-02-09 MED ORDER — KETOROLAC TROMETHAMINE 60 MG/2ML IM SOLN
60.0000 mg | Freq: Once | INTRAMUSCULAR | Status: AC
  Administered 2016-02-09: 60 mg via INTRAMUSCULAR
  Filled 2016-02-09: qty 2

## 2016-02-09 MED ORDER — SODIUM CHLORIDE 0.9 % IV SOLN
INTRAVENOUS | Status: DC
  Administered 2016-02-09: 13:00:00 via INTRAVENOUS

## 2016-02-09 MED ORDER — ONDANSETRON 4 MG PO TBDP
4.0000 mg | ORAL_TABLET | Freq: Once | ORAL | Status: AC
  Administered 2016-02-09: 4 mg via ORAL
  Filled 2016-02-09: qty 1

## 2016-02-09 MED ORDER — ONDANSETRON 4 MG PO TBDP: 4.0000 mg | ORAL_TABLET | Freq: Four times a day (QID) | ORAL | Status: DC | PRN

## 2016-02-09 NOTE — Discharge Instructions (Signed)

## 2016-02-09 NOTE — MAU Note (Signed)
Pt had a fever of 101.6 last night @ 2300, nausea & vomiting started last night.  Fever broke @ 0545, is dizzy.  Started having diarrhea this morning x 4.    Having yellowish discharge, with red spots.   Abdominal hysterectomy on 3/28.

## 2016-02-09 NOTE — MAU Provider Note (Signed)
. Chief Complaint:  Emesis; Diarrhea; and Fever   First Provider Initiated Contact with Patient 02/09/16 Virginia [name] is a 55 y.o. G2P2002 who presents to maternity admissions reporting fever and diarrhea which started today.  Nausea started Sunday (2 days ago). Also had diffuse abdominal pain, moreso lower abdomen. She reports light vaginal bleeding, no vaginal itching/burning, urinary symptoms, h/a, dizziness.    Had a total abdominal hysterectomy on 02/02/16.  Did well postoperatively until 2 days ago when she developed nausea. Had her first bowel movement on Saturday (POD#4). Diarrhea started today.  Tmax was 101.6 last night.   Denies pain in legs. Denies respiratory symptoms.    Diarrhea  This is a new problem. The current episode started today. The problem occurs 2 to 4 times per day. The problem has been unchanged. The patient states that diarrhea does not awaken her from sleep. Associated symptoms include abdominal pain, a fever and vomiting. Pertinent negatives include no chills, headaches or myalgias. Nothing aggravates the symptoms. There are no known risk factors. She has tried nothing for the symptoms.  Fever  This is a new problem. The current episode started yesterday. The problem has been gradually improving. The maximum temperature noted was 101 to 101.9 F. The temperature was taken using an oral thermometer. Associated symptoms include abdominal pain, diarrhea, nausea and vomiting. Pertinent negatives include no chest pain, congestion, headaches, sore throat, urinary pain or wheezing. She has tried acetaminophen for the symptoms. The treatment provided moderate relief.  Emesis  This is a new problem. The current episode started yesterday. The problem occurs less than 2 times per day. The problem has been unchanged. The maximum temperature recorded prior to her arrival was 101 - 101.9 F. The fever has been present for less than 1 day. Associated symptoms include  abdominal pain, diarrhea and a fever. Pertinent negatives include no chest pain, chills, dizziness, headaches or myalgias. She has tried nothing for the symptoms.   RN Note:  Expand All Collapse All   Pt had a fever of 101.6 last night @ 2300, nausea & vomiting started last night. Fever broke @ 0545, is dizzy. Started having diarrhea this morning x 4. Having yellowish discharge, with red spots. Abdominal hysterectomy on 3/28.           Past Medical History: Past Medical History  Diagnosis Date  . Hyperlipidemia with target LDL less than 70   . STEMI (ST elevation myocardial infarction) ([facility]) 06/16/2015    BMS x 2 CFX  . Diabetes mellitus without complication ([facility])   . Hypertension   . CHF (congestive heart failure) ([facility])   . CAD (coronary artery disease)     a. 06/2015 BMS x2 to LCx. b. recurrent CP, cath 09/18/2015 patent stent, tiny diag stenosis medical therapy.   Marland Kitchen GERD (gastroesophageal reflux disease)     ?  Marland Kitchen Anemia   . Thyroid nodule     benign  . Shortness of breath dyspnea     with exertion    Past obstetric history: OB History  Gravida Para Term Preterm AB SAB TAB Ectopic Multiple Living  _0 # Outcome Date GA Lbr Len/2nd Weight Sex Delivery Anes PTL Lv  2 Term      Vag-Spont     1 Term      CS-LTranv         Past Surgical History: Past Surgical  History  Procedure Laterality Date  . Appendectomy    . Esophagogastroduodenoscopy N/A 09/20/2015    Procedure: ESOPHAGOGASTRODUODENOSCOPY (EGD);  Surgeon: Carol Ada, MD;  Location: [facility] ENDOSCOPY;  Service: Endoscopy;  Laterality: N/A;  . Colonoscopy N/A 09/20/2015    Procedure: COLONOSCOPY;  Surgeon: Carol Ada, MD;  Location: [facility];  Service: Endoscopy;  Laterality: N/A;  . Colonoscopy    . Cesarean section      Myomectomy done during cesarean section  . Olympus  quick clip pro gastric body  10/09/15  . Cardiac catheterization N/A 06/17/2015    Procedure: Left Heart Cath and  Coronary Angiography;  Surgeon: Jettie Booze, MD;  Location: [facility];  Service: Cardiovascular;  Laterality: N/A;  . Cardiac catheterization  06/17/2015    Procedure: Coronary Stent Intervention;  Surgeon: Jettie Booze, MD;  Location: [facility];  Service: Cardiovascular;;  . Cardiac catheterization N/A 09/18/2015    Procedure: Left Heart Cath and Coronary Angiography;  Surgeon: Troy Sine, MD;  Location: [facility];  Service: Cardiovascular;  Laterality: N/A;  . Abdominal hysterectomy N/A 02/02/2016    Procedure: HYSTERECTOMY ABDOMINAL WITH BILATERAL SALPINGECTOMY;  Surgeon: Woodroe Mode, MD;  Location: [facility];  Service: Gynecology;  Laterality: N/A;    Family History: Family History  Problem Relation Age of Onset  . Stroke Mother   . Heart disease Mother   . Diabetes Mother   . Cancer Father   . Hypertension Sister   . Hyperlipidemia Sister   . Heart attack Brother   . Hypertension Brother   . Heart attack Maternal Uncle   . Asthma Paternal Uncle   . Diabetes Paternal Uncle   . Thyroid disease Neg Hx   . Diabetes Paternal Aunt     Social History: Social History  Substance Use Topics  . Smoking status: Never Smoker   . Smokeless tobacco: Never Used  . Alcohol Use: No    Allergies: No Known Allergies  Meds:  Prescriptions prior to admission  Medication Sig Dispense Refill Last Dose  . aspirin EC 81 MG tablet Take 81 mg by mouth daily.   02/01/2016 at 0800  . atorvastatin (LIPITOR) 80 MG tablet Take 1 tablet (80 mg total) by mouth daily at 6 PM. 90 tablet 3 02/01/2016 at Unknown time  . Blood Glucose Monitoring Suppl (TRUE METRIX METER) w/Device KIT Check BS once daily 1 kit 0   . docusate sodium (COLACE) 100 MG capsule Take 1 capsule (100 mg total) by mouth 2 (two) times daily. 10 capsule 0   . ferrous sulfate 325 (65 FE) MG tablet Take 1 tablet (325 mg total) by mouth daily with breakfast. 90 tablet 3   . glucose blood (TRUE  METRIX BLOOD GLUCOSE TEST) test strip Use as instructed 100 each 12   . isosorbide mononitrate (IMDUR) 30 MG 24 hr tablet Take 0.5 tablets (15 mg total) by mouth daily. 30 tablet 5 Taking  . lisinopril (PRINIVIL,ZESTRIL) 2.5 MG tablet Take 1 tablet (2.5 mg total) by mouth daily. 90 tablet 3 02/02/2016 at 0800  . metFORMIN (GLUCOPHAGE) 500 MG tablet Take 1 tablet (500 mg total) by mouth 2 (two) times daily with a meal. 180 tablet 3   . metoprolol tartrate (LOPRESSOR) 25 MG tablet Take 1 tablet (25 mg total) by mouth 2 (two) times daily. 180 tablet 3 02/02/2016 at 0800  . nitroGLYCERIN (NITROSTAT) 0.4 MG SL tablet Place 1 tablet (0.4 mg total) under the tongue  every 5 (five) minutes as needed for chest pain. 25 tablet 2 Taking  . oxyCODONE-acetaminophen (PERCOCET/ROXICET) 5-325 MG tablet Take 1-2 tablets by mouth every 4 (four) hours as needed for severe pain (moderate to severe pain (when tolerating fluids)). 30 tablet 0   . pantoprazole (PROTONIX) 40 MG tablet Take 1 tablet (40 mg total) by mouth daily. (Patient not taking: Reported on 01/12/2016) 30 tablet 0 Not Taking at Unknown time  . TRUEPLUS LANCETS 28G MISC Check BS once daily 200 each 3     I have reviewed patient's Past Medical Hx, Surgical Hx, Family Hx, Social Hx, medications and allergies.  ROS:  Review of Systems  Constitutional: Positive for fever. Negative for chills.  HENT: Negative for congestion and sore throat.   Respiratory: Negative for shortness of breath and wheezing.   Cardiovascular: Negative for chest pain.  Gastrointestinal: Positive for nausea, vomiting, abdominal pain and diarrhea. Negative for constipation and abdominal distention.  Genitourinary: Positive for vaginal bleeding, vaginal discharge (yellow with red flecks ) and pelvic pain. Negative for dysuria, flank pain and difficulty urinating.  Musculoskeletal: Negative for myalgias and back pain.  Neurological: Negative for dizziness and headaches.   Other systems  negative  Physical Exam  Patient Vitals for the past 24 hrs:  BP Temp Temp src Pulse Resp  02/09/16 1117 109/64 mmHg 98.8 F (37.1 C) Oral 87 18   Constitutional: Well-developed, well-nourished female in no acute distress, but somewhat ill-appearing.  Cardiovascular: normal rate and rhythm, no ectopy audible, S1 & S2 heard, no murmur Respiratory: normal effort, no distress. Lungs CTAB with no wheezes or crackles GI: Abd soft, mildly tender throughout, no focal tenderness, except over incision. Incision healing, no erethema or drainage except dried blood.   Nondistended.  No rebound, mild guarding.  Bowel Sounds audible  MS: Extremities nontender, no edema, normal ROM Neurologic: Alert and oriented x 4.   Grossly nonfocal. GU: Neg CVAT. Skin:  Warm and Dry Psych:  Affect appropriate.  PELVIC EXAM: Cuff appears intact, could not fully visualize due to discomfort.  Yellowish discharge present, no active bleeding.  Wet prep sent   Labs: No results found for this or any previous visit (from the past 24 hour(s)). --/--/O POS, O POS (03/16 1130)  Imaging:  No results found.  MAU Course/MDM: I have ordered labs as follows:  CBC, diff, wet prep, UA Imaging ordered: none per Dr Harolyn Rutherford Results reviewed.   Consult Dr Harolyn Rutherford.  She states since the WBC count is normal and UA is normal, the fever is likely gastroenteritis. She recommends supportive care with precautions. .   Treatments in MAU included IV fluids given for rehydration.   Pt stable at time of discharge.  Assessment: Postop x 1 week from abdominal hysterectomy Fever, nausea, vomiting and diarrhea Probable gastroenteritis No evidence of postop infection  Plan: Discharge home Recommend Supportive care Advance diet as tolerated Rx sent for Zofran  for Nausea/vomiting Return if symptoms persist or worsen     Medication List    ASK your doctor about these medications        aspirin EC 81 MG tablet  Take 81 mg by mouth  daily.     atorvastatin 80 MG tablet  Commonly known as:  LIPITOR  Take 1 tablet (80 mg total) by mouth daily at 6 PM.     docusate sodium 100 MG capsule  Commonly known as:  COLACE  Take 1 capsule (100 mg total) by mouth 2 (two) times  daily.     ferrous sulfate 325 (65 FE) MG tablet  Take 1 tablet (325 mg total) by mouth daily with breakfast.     glucose blood test strip  Commonly known as:  TRUE METRIX BLOOD GLUCOSE TEST  Use as instructed     isosorbide mononitrate 30 MG 24 hr tablet  Commonly known as:  IMDUR  Take 0.5 tablets (15 mg total) by mouth daily.     lisinopril 2.5 MG tablet  Commonly known as:  PRINIVIL,ZESTRIL  Take 1 tablet (2.5 mg total) by mouth daily.     metFORMIN 500 MG tablet  Commonly known as:  GLUCOPHAGE  Take 1 tablet (500 mg total) by mouth 2 (two) times daily with a meal.     metoprolol tartrate 25 MG tablet  Commonly known as:  LOPRESSOR  Take 1 tablet (25 mg total) by mouth 2 (two) times daily.     nitroGLYCERIN 0.4 MG SL tablet  Commonly known as:  NITROSTAT  Place 1 tablet (0.4 mg total) under the tongue every 5 (five) minutes as needed for chest pain.     oxyCODONE-acetaminophen 5-325 MG tablet  Commonly known as:  PERCOCET/ROXICET  Take 1-2 tablets by mouth every 4 (four) hours as needed for severe pain (moderate to severe pain (when tolerating fluids)).     pantoprazole 40 MG tablet  Commonly known as:  PROTONIX  Take 1 tablet (40 mg total) by mouth daily.     TRUE METRIX METER w/Device Kit  Check BS once daily     TRUEPLUS LANCETS 28G Misc  Check BS once daily       Encouraged to return here or to other Urgent Care/ED if she develops worsening of symptoms, increase in pain, fever, or other concerning symptoms.   Hansel Feinstein CNM, MSN Certified Nurse-Midwife 02/09/2016 12:02 PM

## 2016-02-22 ENCOUNTER — Ambulatory Visit: Payer: No Typology Code available for payment source | Admitting: Obstetrics & Gynecology

## 2016-02-23 MED FILL — ISOSORBIDE MN ER 30 MG TAB: 30 | 30 days supply | Qty: 15 | Fill #5

## 2016-02-23 MED FILL — metFORMIN HCL 500 MG TABS: 500 | 30 days supply | Qty: 60 | Fill #1

## 2016-02-24 MED FILL — ONDANSETRON ODT 4 MG TABLET: 4 | 5 days supply | Qty: 20 | Fill #0

## 2016-03-01 MED FILL — ATORVASTATIN 80 MG TABLET: 80 | 30 days supply | Qty: 30 | Fill #2

## 2016-03-01 MED FILL — LISINOPRIL 2.5 MG TABLET: 2.5 | 30 days supply | Qty: 30 | Fill #2

## 2016-03-07 MED FILL — ?METOPROLOL 25 MG TABLET: 25 | 30 days supply | Qty: 60 | Fill #1

## 2016-03-10 ENCOUNTER — Ambulatory Visit (INDEPENDENT_AMBULATORY_CARE_PROVIDER_SITE_OTHER): Payer: Self-pay | Admitting: Obstetrics & Gynecology

## 2016-03-10 ENCOUNTER — Encounter: Payer: Self-pay | Admitting: Obstetrics & Gynecology

## 2016-03-10 VITALS — BP 145/86 | HR 95 | Wt 166.0 lb

## 2016-03-10 DIAGNOSIS — N951 Menopausal and female climacteric states: Secondary | ICD-10-CM

## 2016-03-10 MED ORDER — ESTRADIOL 1 MG PO TABS: 1.0000 mg | ORAL_TABLET | Freq: Every day | ORAL | Status: DC

## 2016-03-10 NOTE — Patient Instructions (Signed)
Hormone Therapy At menopause, your body begins making less estrogen and progesterone hormones. This causes the body to stop having menstrual periods. This is because estrogen and progesterone hormones control your periods and menstrual cycle. A lack of estrogen may cause symptoms such as:  Hot flushes (or hot flashes).  Vaginal dryness.  Dry skin.  Loss of sex drive.  Risk of bone loss (osteoporosis). When this happens, you may choose to take hormone therapy to get back the estrogen lost during menopause. When the hormone estrogen is given alone, it is usually referred to as ET (Estrogen Therapy). When the hormone progestin is combined with estrogen, it is generally called HT (Hormone Therapy). This was formerly known as hormone replacement therapy (HRT). Your caregiver can help you make a decision on what will be best for you. The decision to use HT seems to change often as new studies are done. Many studies do not agree on the benefits of hormone replacement therapy. LIKELY BENEFITS OF HT INCLUDE PROTECTION FROM:  Hot Flushes (also called hot flashes) - A hot flush is a sudden feeling of heat that spreads over the face and body. The skin may redden like a blush. It is connected with sweats and sleep disturbance. Women going through menopause may have hot flushes a few times a month or several times per day depending on the woman.  Osteoporosis (bone loss) - Estrogen helps guard against bone loss. After menopause, a woman's bones slowly lose calcium and become weak and brittle. As a result, bones are more likely to break. The hip, wrist, and spine are affected most often. Hormone therapy can help slow bone loss after menopause. Weight bearing exercise and taking calcium with vitamin D also can help prevent bone loss. There are also medications that your caregiver can prescribe that can help prevent osteoporosis.  Vaginal dryness - Loss of estrogen causes changes in the vagina. Its lining may  become thin and dry. These changes can cause pain and bleeding during sexual intercourse. Dryness can also lead to infections. This can cause burning and itching. (Vaginal estrogen treatment can help relieve pain, itching, and dryness.)  Urinary tract infections are more common after menopause because of lack of estrogen. Some women also develop urinary incontinence because of low estrogen levels in the vagina and bladder.  Possible other benefits of estrogen include a positive effect on mood and short-term memory in women. RISKS AND COMPLICATIONS  Using estrogen alone without progesterone causes the lining of the uterus to grow. This increases the risk of lining of the uterus (endometrial) cancer. Your caregiver should give another hormone called progestin if you have a uterus.  Women who take combined (estrogen and progestin) HT appear to have an increased risk of breast cancer. The risk appears to be small, but increases throughout the time that HT is taken.  Combined therapy also makes the breast tissue slightly denser which makes it harder to read mammograms (breast X-rays).  Combined, estrogen and progesterone therapy can be taken together every day, in which case there may be spotting of blood. HT therapy can be taken cyclically in which case you will have menstrual periods. Cyclically means HT is taken for a set amount of days, then not taken, then this process is repeated.  HT may increase the risk of stroke, heart attack, breast cancer and forming blood clots in your leg.  Transdermal estrogen (estrogen that is absorbed through the skin with a patch or a cream) may have better results with:  Cholesterol.  Blood pressure.  Blood clots. Having the following conditions may indicate you should not have HT:  Endometrial cancer.  Liver disease.  Breast cancer.  Heart disease.  History of blood clots.  Stroke. TREATMENT   If you choose to take HT and have a uterus, usually  estrogen and progestin are prescribed.  Your caregiver will help you decide the best way to take the medications.  Possible ways to take estrogen include:  Pills.  Patches.  Gels.  Sprays.  Vaginal estrogen cream, rings and tablets.  It is best to take the lowest dose possible that will help your symptoms and take them for the shortest period of time that you can.  Hormone therapy can help relieve some of the problems (symptoms) that affect women at menopause. Before making a decision about HT, talk to your caregiver about what is best for you. Be well informed and comfortable with your decisions. HOME CARE INSTRUCTIONS   Follow your caregivers advice when taking the medications.  A Pap test is done to screen for cervical cancer.  The first Pap test should be done at age 34.  Between ages 80 and 52, Pap tests are repeated every 2 years.  Beginning at age 13, you are advised to have a Pap test every 3 years as long as the past 3 Pap tests have been normal.  Some women have medical problems that increase the chance of getting cervical cancer. Talk to your caregiver about these problems. It is especially important to talk to your caregiver if a new problem develops soon after your last Pap test. In these cases, your caregiver may recommend more frequent screening and Pap tests.  The above recommendations are the same for women who have or have not gotten the vaccine for HPV (human papillomavirus).  If you had a hysterectomy for a problem that was not a cancer or a condition that could lead to cancer, then you no longer need Pap tests. However, even if you no longer need a Pap test, a regular exam is a good idea to make sure no other problems are starting.  If you are between ages 20 and 60, and you have had normal Pap tests going back 10 years, you no longer need Pap tests. However, even if you no longer need a Pap test, a regular exam is a good idea to make sure no other problems  are starting.  If you have had past treatment for cervical cancer or a condition that could lead to cancer, you need Pap tests and screening for cancer for at least 20 years after your treatment.  If Pap tests have been discontinued, risk factors (such as a new sexual partner)need to be re-assessed to determine if screening should be resumed.  Some women may need screenings more often if they are at high risk for cervical cancer.  Get mammograms done as per the advice of your caregiver. SEEK IMMEDIATE MEDICAL CARE IF:  You develop abnormal vaginal bleeding.  You have pain or swelling in your legs, shortness of breath, or chest pain.  You develop dizziness or headaches.  You have lumps or changes in your breasts or armpits.  You have slurred speech.  You develop weakness or numbness of your arms or legs.  You have pain, burning, or bleeding when urinating.  You develop abdominal pain.   This information is not intended to replace advice given to you by your health care provider. Make sure you discuss any questions  you have with your health care provider.   Document Released: 07/23/2003 Document Revised: 03/10/2015 Document Reviewed: 04/27/2015 Elsevier Interactive Patient Education 2016 [employer]  

## 2016-03-10 NOTE — Progress Notes (Signed)
Subjective:     Virginia [name] is a 55 y.o. female who presents to the clinic 5 weeks status post total abdominal hysterectomy for fibroids. Eating a regular diet without difficulty. Bowel movements are normal. The patient is not having any pain.  The following portions of the patient's history were reviewed and updated as appropriate: allergies, current medications, past family history, past medical history, past social history, past surgical history and problem list.  Review of Systems Pertinent items are noted in HPI. vasomotor symptoms    Objective:    BP 145/86 mmHg  Pulse 95  Wt 166 lb (75.297 kg)  LMP 12/29/2015 General:  alert, cooperative and no distress  Abdomen: soft, bowel sounds active, non-tender  Incision:   healing well, no drainage, no erythema, no hernia, no seroma, no swelling, no dehiscence, incision well approximated     Assessment:    Doing well postoperatively. Operative findings again reviewed. Pathology report discussed.    Plan:    1. Continue any current medications. 2. Wound care discussed. 3. Activity restrictions: none 4. Anticipated return to work: now. 5. Follow up: as needed Estrace po for VMS  Woodroe Mode, MD 03/10/2016

## 2016-03-15 MED FILL — ISOSORBIDE MN ER 30 MG TAB: 30 | 30 days supply | Qty: 15 | Fill #6

## 2016-03-22 ENCOUNTER — Encounter: Payer: Self-pay | Admitting: Gastroenterology

## 2016-03-25 MED FILL — ?ESTRADIOL 1MG TABLET: 1 | 30 days supply | Qty: 30 | Fill #0

## 2016-03-25 MED FILL — METOPROLOL TARTRATE 25 MG T: 25 | 30 days supply | Qty: 60 | Fill #2

## 2016-03-29 ENCOUNTER — Encounter: Payer: Self-pay | Admitting: Internal Medicine

## 2016-03-29 ENCOUNTER — Ambulatory Visit (INDEPENDENT_AMBULATORY_CARE_PROVIDER_SITE_OTHER): Payer: No Typology Code available for payment source | Admitting: Internal Medicine

## 2016-03-29 VITALS — BP 110/70 | HR 73 | Temp 98.4°F | Resp 18 | Ht 66.0 in | Wt 164.0 lb

## 2016-03-29 DIAGNOSIS — I10 Essential (primary) hypertension: Secondary | ICD-10-CM

## 2016-03-29 DIAGNOSIS — R200 Anesthesia of skin: Secondary | ICD-10-CM | POA: Insufficient documentation

## 2016-03-29 DIAGNOSIS — E119 Type 2 diabetes mellitus without complications: Secondary | ICD-10-CM

## 2016-03-29 DIAGNOSIS — Z1231 Encounter for screening mammogram for malignant neoplasm of breast: Secondary | ICD-10-CM

## 2016-03-29 DIAGNOSIS — R208 Other disturbances of skin sensation: Secondary | ICD-10-CM

## 2016-03-29 LAB — GLUCOSE, CAPILLARY: Glucose-Capillary: 106 mg/dL — ABNORMAL HIGH (ref 65–99)

## 2016-03-29 MED ORDER — VITAMIN B-12 1000 MCG PO TABS: 1000.0000 ug | ORAL_TABLET | Freq: Every day | ORAL | Status: DC

## 2016-03-29 NOTE — Progress Notes (Signed)
Patient ID: Virginia [name], female   DOB: [birth year], 55 y.o.   MRN: [id number]   Virginia [name], is a 55 y.o. female  [id number]  [id number]  DOB - October 28, 1961  Chief Complaint  Patient presents with  . Follow-up    3 month        Subjective:   Virginia [name] is a 55 y.o. female has history of type 2 diabetes mellitus currently on metformin, dysfunctional uterine bleeding due to fibroids status post abdominal hysterectomy in April 2017, hypertension, and coronary artery disease here today for a follow up visit of diabetes and hypertension. Blood sugar is better controlled, patient is compliant with her medications, she reports no side effects. But today she complains of numbness and tingling of the right thigh, anterioly, ongoing for about 2 weeks. No numbness of hands or feet. No history of back pain, no trauma or injury, no fever. Patient has No headache, No chest pain, No abdominal pain - No Nausea, No Cough, no SOB. Patient is due for mammogram.  Problem  Controlled Type 2 Diabetes Mellitus Without Complication, Without Long-Term Current Use of Insulin (Hcc)  Numbness of Anterior Thigh    ALLERGIES: No Known Allergies  PAST MEDICAL HISTORY: Past Medical History  Diagnosis Date  . Hyperlipidemia with target LDL less than 70   . STEMI (ST elevation myocardial infarction) ([place]) 06/16/2015    BMS x 2 CFX  . Diabetes mellitus without complication ([place])   . Hypertension   . CHF (congestive heart failure) ([place])   . CAD (coronary artery disease)     a. 06/2015 BMS x2 to LCx. b. recurrent CP, cath 09/18/2015 patent stent, tiny diag stenosis medical therapy.   Marland Kitchen GERD (gastroesophageal reflux disease)     ?  Marland Kitchen Anemia   . Thyroid nodule     benign  . Shortness of breath dyspnea     with exertion    MEDICATIONS AT HOME: Prior to Admission medications   Medication Sig Start Date End Date Taking? Authorizing Provider  aspirin EC 81 MG tablet Take 81 mg by mouth daily.   Yes  Historical Provider, MD  atorvastatin (LIPITOR) 80 MG tablet Take 1 tablet (80 mg total) by mouth daily at 6 PM. 09/18/15  Yes Wandra Mannan, MD  Blood Glucose Monitoring Suppl (TRUE METRIX METER) w/Device KIT Check BS once daily 12/29/15  Yes Mico Spark E Doreene Burke, MD  estradiol (ESTRACE) 1 MG tablet Take 1 tablet (1 mg total) by mouth daily. 03/10/16  Yes Woodroe Mode, MD  ferrous sulfate 325 (65 FE) MG tablet Take 1 tablet (325 mg total) by mouth daily with breakfast. 12/29/15  Yes Tresa Garter, MD  glucose blood (TRUE METRIX BLOOD GLUCOSE TEST) test strip Use as instructed 12/29/15  Yes Tresa Garter, MD  isosorbide mononitrate (IMDUR) 30 MG 24 hr tablet Take 0.5 tablets (15 mg total) by mouth daily. 09/18/15  Yes Almyra Deforest, PA  lisinopril (PRINIVIL,ZESTRIL) 2.5 MG tablet Take 1 tablet (2.5 mg total) by mouth daily. 06/23/15  Yes Tresa Garter, MD  metFORMIN (GLUCOPHAGE) 500 MG tablet Take 1 tablet (500 mg total) by mouth 2 (two) times daily with a meal. 12/29/15  Yes Waylen Depaolo E Doreene Burke, MD  metoprolol tartrate (LOPRESSOR) 25 MG tablet Take 1 tablet (25 mg total) by mouth 2 (two) times daily. 06/23/15  Yes Tresa Garter, MD  nitroGLYCERIN (NITROSTAT) 0.4 MG SL tablet Place 1 tablet (0.4 mg total) under the tongue every 5 (five) minutes  as needed for chest pain. 06/19/15  Yes Brittainy Erie Noe, PA-C  pantoprazole (PROTONIX) 40 MG tablet Take 1 tablet (40 mg total) by mouth daily. 09/22/15  Yes Thurnell Lose, MD  TRUEPLUS LANCETS 28G MISC Check BS once daily 12/29/15  Yes Tresa Garter, MD  vitamin B-12 (CYANOCOBALAMIN) 1000 MCG tablet Take 1 tablet (1,000 mcg total) by mouth daily. 03/29/16   Tresa Garter, MD     Objective:   Filed Vitals:   03/29/16 1009  BP: 110/70  Pulse: 73  Temp: 98.4 F (36.9 C)  TempSrc: Oral  Resp: 18  Height: '5\' 6"'  (1.676 m)  Weight: 164 lb (74.39 kg)  SpO2: 100%    Exam General appearance : Awake, alert, not in any distress.  Speech Clear. Not toxic looking HEENT: Atraumatic and Normocephalic, pupils equally reactive to light and accomodation Neck: supple, no JVD. No cervical lymphadenopathy.  Chest:Good air entry bilaterally, no added sounds  CVS: S1 S2 regular, no murmurs.  Abdomen: Bowel sounds present, Non tender and not distended with no gaurding, rigidity or rebound. Extremities: B/L Lower Ext shows no edema, both legs are warm to touch Neurology: Awake alert, and oriented X 3, CN II-XII intact, Non focal Skin: No Rash  Data Review Lab Results  Component Value Date   HGBA1C 6.1* 12/29/2015   HGBA1C 6.4* 09/19/2015   HGBA1C 6.6* 06/17/2015     Assessment & Plan   1. Essential hypertension: Controlled  We have discussed target BP range and blood pressure goal. I have advised patient to check BP regularly and to call us back or report to clinic if the numbers are consistently higher than 140/90. We discussed the importance of compliance with medical therapy and DASH diet recommended, consequences of uncontrolled hypertension discussed.   - continue current BP medications  2. Controlled type 2 diabetes mellitus without complication, without long-term current use of insulin (HCC)  - vitamin B-12 (CYANOCOBALAMIN) 1000 MCG tablet; Take 1 tablet (1,000 mcg total) by mouth daily.  Dispense: 90 tablet; Refill: 3  Aim for 30 minutes of exercise most days. Rethink what you drink. Water is great! Aim for 2-3 Carb Choices per meal (30-45 grams) +/- 1 either way  Aim for 0-15 Carbs per snack if hungry  Include protein in moderation with your meals and snacks  Consider reading food labels for Total Carbohydrate and Fat Grams of foods  Consider checking BG at alternate times per day  Continue taking medication as directed Be mindful about how much sugar you are adding to beverages and other foods. Fruit Punch - find one with no sugar  Measure and decrease portions of carbohydrate foods  Make your plate  and don't go back for seconds  3. Numbness of anterior thigh  - vitamin B-12 (CYANOCOBALAMIN) 1000 MCG tablet; Take 1 tablet (1,000 mcg total) by mouth daily.  Dispense: 90 tablet; Refill: 3  4. Encounter for screening mammogram for breast cancer  - MM Digital Screening; Future  Patient have been counseled extensively about nutrition and exercise  Return in about 6 months (around 09/29/2016) for Hemoglobin A1C and Follow up, DM, Follow up HTN.  The patient was given clear instructions to go to ER or return to medical center if symptoms don't improve, worsen or new problems develop. The patient verbalized understanding. The patient was told to call to get lab results if they haven't heard anything in the next week.   This note has been created with Dragon speech recognition  Engineer, drilling. Any transcriptional errors are unintentional.    [name], MD, [place], Karilyn Cota, [place] and [facility] [place], [place]   03/29/2016, 10:40 AM

## 2016-03-29 NOTE — Patient Instructions (Signed)
Diabetes and Exercise Exercising regularly is important. It is not just about losing weight. It has many health benefits, such as:  Improving your overall fitness, flexibility, and endurance.  Increasing your bone density.  Helping with weight control.  Decreasing your body fat.  Increasing your muscle strength.  Reducing stress and tension.  Improving your overall health. People with diabetes who exercise gain additional benefits because exercise:  Reduces appetite.  Improves the body's use of blood sugar (glucose).  Helps lower or control blood glucose.  Decreases blood pressure.  Helps control blood lipids (such as cholesterol and triglycerides).  Improves the body's use of the hormone insulin by:  Increasing the body's insulin sensitivity.  Reducing the body's insulin needs.  Decreases the risk for heart disease because exercising:  Lowers cholesterol and triglycerides levels.  Increases the levels of good cholesterol (such as high-density lipoproteins [HDL]) in the body.  Lowers blood glucose levels. YOUR ACTIVITY PLAN  Choose an activity that you enjoy, and set realistic goals. To exercise safely, you should begin practicing any new physical activity slowly, and gradually increase the intensity of the exercise over time. Your health care provider or diabetes educator can help create an activity plan that works for you. General recommendations include:  Encouraging children to engage in at least 60 minutes of physical activity each day.  Stretching and performing strength training exercises, such as yoga or weight lifting, at least 2 times per week.  Performing a total of at least 150 minutes of moderate-intensity exercise each week, such as brisk walking or water aerobics.  Exercising at least 3 days per week, making sure you allow no more than 2 consecutive days to pass without exercising.  Avoiding long periods of inactivity (90 minutes or more). When you  have to spend an extended period of time sitting down, take frequent breaks to walk or stretch. RECOMMENDATIONS FOR EXERCISING WITH TYPE 1 OR TYPE 2 DIABETES   Check your blood glucose before exercising. If blood glucose levels are greater than 240 mg/dL, check for urine ketones. Do not exercise if ketones are present.  Avoid injecting insulin into areas of the body that are going to be exercised. For example, avoid injecting insulin into:  The arms when playing tennis.  The legs when jogging.  Keep a record of:  Food intake before and after you exercise.  Expected peak times of insulin action.  Blood glucose levels before and after you exercise.  The type and amount of exercise you have done.  Review your records with your health care provider. Your health care provider will help you to develop guidelines for adjusting food intake and insulin amounts before and after exercising.  If you take insulin or oral hypoglycemic agents, watch for signs and symptoms of hypoglycemia. They include:  Dizziness.  Shaking.  Sweating.  Chills.  Confusion.  Drink plenty of water while you exercise to prevent dehydration or heat stroke. Body water is lost during exercise and must be replaced.  Talk to your health care provider before starting an exercise program to make sure it is safe for you. Remember, almost any type of activity is better than none.   This information is not intended to replace advice given to you by your health care provider. Make sure you discuss any questions you have with your health care provider.   Document Released: 01/14/2004 Document Revised: 03/10/2015 Document Reviewed: 04/02/2013 Elsevier Interactive Patient Education 2016 [employer] Basic Carbohydrate Counting for Diabetes Mellitus Carbohydrate counting   is a method for keeping track of the amount of carbohydrates you eat. Eating carbohydrates naturally increases the level of sugar (glucose) in your  blood, so it is important for you to know the amount that is okay for you to have in every meal. Carbohydrate counting helps keep the level of glucose in your blood within normal limits. The amount of carbohydrates allowed is different for every person. A dietitian can help you calculate the amount that is right for you. Once you know the amount of carbohydrates you can have, you can count the carbohydrates in the foods you want to eat. Carbohydrates are found in the following foods:  Grains, such as breads and cereals.  Dried beans and soy products.  Starchy vegetables, such as potatoes, peas, and corn.  Fruit and fruit juices.  Milk and yogurt.  Sweets and snack foods, such as cake, cookies, candy, chips, soft drinks, and fruit drinks. CARBOHYDRATE COUNTING There are two ways to count the carbohydrates in your food. You can use either of the methods or a combination of both. Reading the "Nutrition Facts" on Packaged Food The "Nutrition Facts" is an area that is included on the labels of almost all packaged food and beverages in the [place]. It includes the serving size of that food or beverage and information about the nutrients in each serving of the food, including the grams (g) of carbohydrate per serving.  Decide the number of servings of this food or beverage that you will be able to eat or drink. Multiply that number of servings by the number of grams of carbohydrate that is listed on the label for that serving. The total will be the amount of carbohydrates you will be having when you eat or drink this food or beverage. Learning Standard Serving Sizes of Food When you eat food that is not packaged or does not include "Nutrition Facts" on the label, you need to measure the servings in order to count the amount of carbohydrates.A serving of most carbohydrate-rich foods contains about 15 g of carbohydrates. The following list includes serving sizes of carbohydrate-rich foods that  provide 15 g ofcarbohydrate per serving:   1 slice of bread (1 oz) or 1 six-inch tortilla.    of a hamburger bun or English muffin.  4-6 crackers.   cup unsweetened dry cereal.    cup hot cereal.   cup rice or pasta.    cup mashed potatoes or  of a large baked potato.  1 cup fresh fruit or one small piece of fruit.    cup canned or frozen fruit or fruit juice.  1 cup milk.   cup plain fat-free yogurt or yogurt sweetened with artificial sweeteners.   cup cooked dried beans or starchy vegetable, such as peas, corn, or potatoes.  Decide the number of standard-size servings that you will eat. Multiply that number of servings by 15 (the grams of carbohydrates in that serving). For example, if you eat 2 cups of strawberries, you will have eaten 2 servings and 30 g of carbohydrates (2 servings x 15 g = 30 g). For foods such as soups and casseroles, in which more than one food is mixed in, you will need to count the carbohydrates in each food that is included. EXAMPLE OF CARBOHYDRATE COUNTING Sample Dinner  3 oz chicken breast.   cup of brown rice.   cup of corn.  1 cup milk.   1 cup strawberries with sugar-free whipped topping.  Carbohydrate Calculation Step   1: Identify the foods that contain carbohydrates:   Rice.   Corn.   Milk.   Strawberries. Step 2:Calculate the number of servings eaten of each:   2 servings of rice.   1 serving of corn.   1 serving of milk.   1 serving of strawberries. Step 3: Multiply each of those number of servings by 15 g:   2 servings of rice x 15 g = 30 g.   1 serving of corn x 15 g = 15 g.   1 serving of milk x 15 g = 15 g.   1 serving of strawberries x 15 g = 15 g. Step 4: Add together all of the amounts to find the total grams of carbohydrates eaten: 30 g + 15 g + 15 g + 15 g = 75 g.   This information is not intended to replace advice given to you by your health care provider. Make sure you  discuss any questions you have with your health care provider.   Document Released: 10/24/2005 Document Revised: 11/14/2014 Document Reviewed: 09/20/2013 Elsevier Interactive Patient Education 2016 [employer]. Hypertension Hypertension, commonly called high blood pressure, is when the force of blood pumping through your arteries is too strong. Your arteries are the blood vessels that carry blood from your heart throughout your body. A blood pressure reading consists of a higher number over a lower number, such as 110/72. The higher number (systolic) is the pressure inside your arteries when your heart pumps. The lower number (diastolic) is the pressure inside your arteries when your heart relaxes. Ideally you want your blood pressure below 120/80. Hypertension forces your heart to work harder to pump blood. Your arteries may become narrow or stiff. Having untreated or uncontrolled hypertension can cause heart attack, stroke, kidney disease, and other problems. RISK FACTORS Some risk factors for high blood pressure are controllable. Others are not.  Risk factors you cannot control include:   Race. You may be at higher risk if you are African American.  Age. Risk increases with age.  Gender. Men are at higher risk than women before age 85 years. After age 26, women are at higher risk than men. Risk factors you can control include:  Not getting enough exercise or physical activity.  Being overweight.  Getting too much fat, sugar, calories, or salt in your diet.  Drinking too much alcohol. SIGNS AND SYMPTOMS Hypertension does not usually cause signs or symptoms. Extremely high blood pressure (hypertensive crisis) may cause headache, anxiety, shortness of breath, and nosebleed. DIAGNOSIS To check if you have hypertension, your health care provider will measure your blood pressure while you are seated, with your arm held at the level of your heart. It should be measured at least twice using  the same arm. Certain conditions can cause a difference in blood pressure between your right and left arms. A blood pressure reading that is higher than normal on one occasion does not mean that you need treatment. If it is not clear whether you have high blood pressure, you may be asked to return on a different day to have your blood pressure checked again. Or, you may be asked to monitor your blood pressure at home for 1 or more weeks. TREATMENT Treating high blood pressure includes making lifestyle changes and possibly taking medicine. Living a healthy lifestyle can help lower high blood pressure. You may need to change some of your habits. Lifestyle changes may include:  Following the DASH diet. This diet is  high in fruits, vegetables, and whole grains. It is low in salt, red meat, and added sugars.  Keep your sodium intake below 2,300 mg per day.  Getting at least 30-45 minutes of aerobic exercise at least 4 times per week.  Losing weight if necessary.  Not smoking.  Limiting alcoholic beverages.  Learning ways to reduce stress. Your health care provider may prescribe medicine if lifestyle changes are not enough to get your blood pressure under control, and if one of the following is true:  You are 61-42 years of age and your systolic blood pressure is above 140.  You are 73 years of age or older, and your systolic blood pressure is above 150.  Your diastolic blood pressure is above 90.  You have diabetes, and your systolic blood pressure is over [id number] or your diastolic blood pressure is over 90.  You have kidney disease and your blood pressure is above 140/90.  You have heart disease and your blood pressure is above 140/90. Your personal target blood pressure may vary depending on your medical conditions, your age, and other factors. HOME CARE INSTRUCTIONS  Have your blood pressure rechecked as directed by your health care provider.   Take medicines only as directed by your  health care provider. Follow the directions carefully. Blood pressure medicines must be taken as prescribed. The medicine does not work as well when you skip doses. Skipping doses also puts you at risk for problems.  Do not smoke.   Monitor your blood pressure at home as directed by your health care provider. SEEK MEDICAL CARE IF:   You think you are having a reaction to medicines taken.  You have recurrent headaches or feel dizzy.  You have swelling in your ankles.  You have trouble with your vision. SEEK IMMEDIATE MEDICAL CARE IF:  You develop a severe headache or confusion.  You have unusual weakness, numbness, or feel faint.  You have severe chest or abdominal pain.  You vomit repeatedly.  You have trouble breathing. MAKE SURE YOU:   Understand these instructions.  Will watch your condition.  Will get help right away if you are not doing well or get worse.   This information is not intended to replace advice given to you by your health care provider. Make sure you discuss any questions you have with your health care provider.   Document Released: 10/24/2005 Document Revised: 03/10/2015 Document Reviewed: 08/16/2013 Elsevier Interactive Patient Education 2016 [place] DASH stands for "Dietary Approaches to Stop Hypertension." The DASH eating plan is a healthy eating plan that has been shown to reduce high blood pressure (hypertension). Additional health benefits may include reducing the risk of type 2 diabetes mellitus, heart disease, and stroke. The DASH eating plan may also help with weight loss. WHAT DO I NEED TO KNOW ABOUT THE DASH EATING PLAN? For the DASH eating plan, you will follow these general guidelines:  Choose foods with a percent daily value for sodium of less than 5% (as listed on the food label).  Use salt-free seasonings or herbs instead of table salt or sea salt.  Check with your health care provider or pharmacist before  using salt substitutes.  Eat lower-sodium products, often labeled as "lower sodium" or "no salt added."  Eat fresh foods.  Eat more vegetables, fruits, and low-fat dairy products.  Choose whole grains. Look for the word "whole" as the first word in the ingredient list.  Choose fish and skinless chicken or [place]  more often than red meat. Limit fish, poultry, and meat to 6 oz (170 g) each day.  Limit sweets, desserts, sugars, and sugary drinks.  Choose heart-healthy fats.  Limit cheese to 1 oz (28 g) per day.  Eat more home-cooked food and less restaurant, buffet, and fast food.  Limit fried foods.  Cook foods using methods other than frying.  Limit canned vegetables. If you do use them, rinse them well to decrease the sodium.  When eating at a restaurant, ask that your food be prepared with less salt, or no salt if possible. WHAT FOODS CAN I EAT? Seek help from a dietitian for individual calorie needs. Grains Whole grain or whole wheat bread. Brown rice. Whole grain or whole wheat pasta. Quinoa, bulgur, and whole grain cereals. Low-sodium cereals. Corn or whole wheat flour tortillas. Whole grain cornbread. Whole grain crackers. Low-sodium crackers. Vegetables Fresh or frozen vegetables (raw, steamed, roasted, or grilled). Low-sodium or reduced-sodium tomato and vegetable juices. Low-sodium or reduced-sodium tomato sauce and paste. Low-sodium or reduced-sodium canned vegetables.  Fruits All fresh, canned (in natural juice), or frozen fruits. Meat and Other Protein Products Ground beef (85% or leaner), grass-fed beef, or beef trimmed of fat. Skinless chicken or [place]. Ground chicken or [place]. Pork trimmed of fat. All fish and seafood. Eggs. Dried beans, peas, or lentils. Unsalted nuts and seeds. Unsalted canned beans. Dairy Low-fat dairy products, such as skim or 1% milk, 2% or reduced-fat cheeses, low-fat ricotta or cottage cheese, or plain low-fat yogurt. Low-sodium or  reduced-sodium cheeses. Fats and Oils Tub margarines without trans fats. Light or reduced-fat mayonnaise and salad dressings (reduced sodium). Avocado. Safflower, olive, or canola oils. Natural peanut or almond butter. Other Unsalted popcorn and pretzels. The items listed above may not be a complete list of recommended foods or beverages. Contact your dietitian for more options. WHAT FOODS ARE NOT RECOMMENDED? Grains White bread. White pasta. White rice. Refined cornbread. Bagels and croissants. Crackers that contain trans fat. Vegetables Creamed or fried vegetables. Vegetables in a cheese sauce. Regular canned vegetables. Regular canned tomato sauce and paste. Regular tomato and vegetable juices. Fruits Dried fruits. Canned fruit in light or heavy syrup. Fruit juice. Meat and Other Protein Products Fatty cuts of meat. Ribs, chicken wings, bacon, sausage, bologna, salami, chitterlings, fatback, hot dogs, bratwurst, and packaged luncheon meats. Salted nuts and seeds. Canned beans with salt. Dairy Whole or 2% milk, cream, half-and-half, and cream cheese. Whole-fat or sweetened yogurt. Full-fat cheeses or blue cheese. Nondairy creamers and whipped toppings. Processed cheese, cheese spreads, or cheese curds. Condiments Onion and garlic salt, seasoned salt, table salt, and sea salt. Canned and packaged gravies. Worcestershire sauce. Tartar sauce. Barbecue sauce. Teriyaki sauce. Soy sauce, including reduced sodium. Steak sauce. Fish sauce. Oyster sauce. Cocktail sauce. Horseradish. Ketchup and mustard. Meat flavorings and tenderizers. Bouillon cubes. Hot sauce. Tabasco sauce. Marinades. Taco seasonings. Relishes. Fats and Oils Butter, stick margarine, lard, shortening, ghee, and bacon fat. Coconut, palm kernel, or palm oils. Regular salad dressings. Other Pickles and olives. Salted popcorn and pretzels. The items listed above may not be a complete list of foods and beverages to avoid. Contact your  dietitian for more information. WHERE CAN I FIND MORE INFORMATION? National Heart, Lung, and Blood Institute: travelstabloid.com   This information is not intended to replace advice given to you by your health care provider. Make sure you discuss any questions you have with your health care provider.   Document Released: 10/13/2011 Document Revised: 11/14/2014 Document  Reviewed: 08/28/2013 Elsevier Interactive Patient Education [employer].

## 2016-03-29 NOTE — Progress Notes (Signed)
Patient is here for 3 month FU  Patient complains of right leg numbness being present for the past week in a half.   Patient has taken medication today and patient has eaten.

## 2016-04-05 MED FILL — ATORVASTATIN 80 MG TABLET: 80 | 30 days supply | Qty: 30 | Fill #3

## 2016-04-05 MED FILL — LISINOPRIL 2.5 MG TABLET: 2.5 | 30 days supply | Qty: 30 | Fill #3

## 2016-04-05 MED FILL — metFORMIN HCL 500 MG TABS: 500 | 30 days supply | Qty: 60 | Fill #2

## 2016-04-26 MED FILL — ISOSORBIDE MN ER 30 MG TAB: 30 | 30 days supply | Qty: 15 | Fill #7

## 2016-04-26 MED FILL — ?ESTRADIOL 1MG TABLET: 1 | 30 days supply | Qty: 30 | Fill #1

## 2016-04-26 MED FILL — FERROUS SULFATE 325 MG TAB: 325 (65 FE) | 90 days supply | Qty: 90 | Fill #1

## 2016-04-26 MED FILL — METOPROLOL TARTRATE 25 MG T: 25 | 30 days supply | Qty: 60 | Fill #3

## 2016-04-29 ENCOUNTER — Other Ambulatory Visit: Payer: Self-pay | Admitting: Internal Medicine

## 2016-04-29 DIAGNOSIS — Z1231 Encounter for screening mammogram for malignant neoplasm of breast: Secondary | ICD-10-CM

## 2016-05-03 MED FILL — ATORVASTATIN 80 MG TABLET: 80 | 30 days supply | Qty: 30 | Fill #4

## 2016-05-03 MED FILL — metFORMIN HCL 500 MG TABS: 500 | 30 days supply | Qty: 60 | Fill #3

## 2016-05-03 MED FILL — LISINOPRIL 2.5 MG TABLET: 2.5 | 30 days supply | Qty: 30 | Fill #4

## 2016-05-17 ENCOUNTER — Ambulatory Visit: Payer: No Typology Code available for payment source

## 2016-05-19 ENCOUNTER — Ambulatory Visit
Admission: RE | Admit: 2016-05-19 | Discharge: 2016-05-19 | Disposition: A | Discharge status: Home / Self Care | Payer: No Typology Code available for payment source | Source: Ambulatory Visit | Attending: Internal Medicine | Admitting: Internal Medicine

## 2016-05-19 DIAGNOSIS — Z1231 Encounter for screening mammogram for malignant neoplasm of breast: Secondary | ICD-10-CM

## 2016-05-23 ENCOUNTER — Other Ambulatory Visit: Payer: Self-pay | Admitting: Internal Medicine

## 2016-05-23 DIAGNOSIS — R928 Other abnormal and inconclusive findings on diagnostic imaging of breast: Secondary | ICD-10-CM

## 2016-05-26 ENCOUNTER — Other Ambulatory Visit: Payer: Self-pay

## 2016-05-26 ENCOUNTER — Other Ambulatory Visit: Payer: Self-pay | Admitting: Internal Medicine

## 2016-05-26 DIAGNOSIS — R928 Other abnormal and inconclusive findings on diagnostic imaging of breast: Secondary | ICD-10-CM

## 2016-05-26 MED FILL — METOPROLOL TARTRATE 25 MG T: 25 | 30 days supply | Qty: 60 | Fill #4

## 2016-05-26 MED FILL — ISOSORBIDE MN ER 30 MG TAB: 30 | 30 days supply | Qty: 15 | Fill #8

## 2016-05-27 ENCOUNTER — Telehealth: Payer: Self-pay | Admitting: *Deleted

## 2016-05-27 ENCOUNTER — Ambulatory Visit
Admission: RE | Admit: 2016-05-27 | Discharge: 2016-05-27 | Disposition: A | Discharge status: Home / Self Care | Payer: No Typology Code available for payment source | Source: Ambulatory Visit | Attending: Internal Medicine | Admitting: Internal Medicine

## 2016-05-27 DIAGNOSIS — R928 Other abnormal and inconclusive findings on diagnostic imaging of breast: Secondary | ICD-10-CM

## 2016-05-27 NOTE — Telephone Encounter (Signed)
-----   Message from Tresa Garter, MD sent at 05/25/2016  9:34 AM EDT ----- Please inform patient that her mammogram revealed possible asymmetry in the left breast that warrants further evaluation. In the right breast, no findings suspicious for malignancy. Diagnostic mammogram and ultrasound of the left breast scheduled for 05/27/2016

## 2016-05-27 NOTE — Telephone Encounter (Signed)
Medical Assistant left message on patient's home and cell voicemail. Voicemail states to give a call back to Nubia with [facility] at [phone].  

## 2016-06-06 MED FILL — LISINOPRIL 2.5 MG TABLET: 2.5 | 30 days supply | Qty: 30 | Fill #5

## 2016-06-06 MED FILL — ESTRADIOL 1 MG TABLET: 1 | 30 days supply | Qty: 30 | Fill #2

## 2016-06-06 MED FILL — ?METFORMIN HCL 500MG TABLET: 500 | 30 days supply | Qty: 60 | Fill #4

## 2016-06-06 MED FILL — ATORVASTATIN 80 MG TABLET: 80 | 30 days supply | Qty: 30 | Fill #5

## 2016-06-07 ENCOUNTER — Telehealth: Payer: Self-pay | Admitting: *Deleted

## 2016-06-07 NOTE — Telephone Encounter (Signed)
Medical Assistant left message on patient's home and cell voicemail. Voicemail states to give a call back to Nubia with [facility] at [phone].  

## 2016-06-07 NOTE — Telephone Encounter (Signed)
-----   Message from Tresa Garter, MD sent at 06/01/2016  9:57 AM EDT ----- Please inform patient that her diagnostic mammogram showed a probably benign mass. Recommend diagnostic mammogram with possible ultrasound in 6 moths

## 2016-06-24 MED FILL — ISOSORBIDE MN ER 30 MG TAB: 30 | 30 days supply | Qty: 15 | Fill #9

## 2016-06-27 ENCOUNTER — Other Ambulatory Visit: Payer: Self-pay | Admitting: Internal Medicine

## 2016-06-27 DIAGNOSIS — I252 Old myocardial infarction: Secondary | ICD-10-CM

## 2016-06-27 DIAGNOSIS — Z09 Encounter for follow-up examination after completed treatment for conditions other than malignant neoplasm: Principal | ICD-10-CM

## 2016-06-27 MED FILL — METOPROLOL TARTRATE 25 MG T: 25 | 90 days supply | Qty: 180 | Fill #0

## 2016-07-06 ENCOUNTER — Other Ambulatory Visit: Payer: Self-pay | Admitting: Internal Medicine

## 2016-07-06 DIAGNOSIS — Z09 Encounter for follow-up examination after completed treatment for conditions other than malignant neoplasm: Principal | ICD-10-CM

## 2016-07-06 DIAGNOSIS — I252 Old myocardial infarction: Secondary | ICD-10-CM

## 2016-07-06 MED FILL — ATORVASTATIN 80 MG TABLET: 80 | 30 days supply | Qty: 30 | Fill #0

## 2016-07-06 MED FILL — LISINOPRIL 2.5 MG TABLET: 2.5 | 30 days supply | Qty: 30 | Fill #0

## 2016-07-06 MED FILL — ?ESTRADIOL 1MG TABLET: 1 | 30 days supply | Qty: 30 | Fill #3

## 2016-07-08 MED FILL — ?METFORMIN HCL 500MG TABLET: 500 | 30 days supply | Qty: 60 | Fill #5

## 2016-07-25 MED FILL — ISOSORBIDE MN ER 30 MG TAB: 30 | 30 days supply | Qty: 15 | Fill #10

## 2016-08-03 MED FILL — LISINOPRIL 2.5 MG TABLET: 2.5 | 30 days supply | Qty: 30 | Fill #1

## 2016-08-03 MED FILL — ATORVASTATIN 80 MG TABLET: 80 | 30 days supply | Qty: 30 | Fill #1

## 2016-08-03 MED FILL — ?ESTRADIOL 1MG TABLET: 1 | 30 days supply | Qty: 30 | Fill #4

## 2016-08-03 MED FILL — ?METFORMIN HCL 500MG TABLET: 500 | 30 days supply | Qty: 60 | Fill #6

## 2016-08-03 MED FILL — FERROUS SULFATE 325 MG TAB: 325 (65 FE) | 90 days supply | Qty: 90 | Fill #2

## 2016-08-16 ENCOUNTER — Ambulatory Visit: Payer: Medicaid Other

## 2016-08-23 MED FILL — ISOSORBIDE MN ER 30 MG TAB: 30 | 30 days supply | Qty: 15 | Fill #11

## 2016-08-26 ENCOUNTER — Encounter: Payer: Self-pay | Admitting: *Deleted

## 2016-09-02 MED FILL — ATORVASTATIN 80 MG TABLET: 80 | 30 days supply | Qty: 30 | Fill #2

## 2016-09-02 MED FILL — ESTRADIOL 1 MG TABLET: 1 | 30 days supply | Qty: 30 | Fill #5

## 2016-09-06 MED FILL — ?METFORMIN HCL 500MG TABLET: 500 | 30 days supply | Qty: 60 | Fill #7

## 2016-09-06 MED FILL — LISINOPRIL 2.5 MG TABLET: 2.5 | 30 days supply | Qty: 30 | Fill #2

## 2016-09-21 ENCOUNTER — Other Ambulatory Visit: Payer: Self-pay | Admitting: Physician Assistant

## 2016-09-21 NOTE — Telephone Encounter (Signed)
Please review for refill. Thanks!  

## 2016-09-22 MED FILL — ?ISOSORBIDE MN ER 30 MG TAB: 30 | 30 days supply | Qty: 15 | Fill #0

## 2016-09-23 ENCOUNTER — Encounter: Payer: Self-pay | Admitting: *Deleted

## 2016-09-27 ENCOUNTER — Ambulatory Visit: Payer: Self-pay | Admitting: Family Medicine

## 2016-09-27 ENCOUNTER — Other Ambulatory Visit: Payer: Self-pay | Admitting: Internal Medicine

## 2016-09-27 DIAGNOSIS — I252 Old myocardial infarction: Secondary | ICD-10-CM

## 2016-09-27 DIAGNOSIS — Z09 Encounter for follow-up examination after completed treatment for conditions other than malignant neoplasm: Principal | ICD-10-CM

## 2016-09-27 MED FILL — METOPROLOL TARTRATE 25 MG T: 25 | 30 days supply | Qty: 60 | Fill #0

## 2016-09-30 ENCOUNTER — Ambulatory Visit: Payer: Self-pay | Admitting: Family Medicine

## 2016-10-05 NOTE — Telephone Encounter (Signed)
Please review for refill. Thanks!  

## 2016-10-07 ENCOUNTER — Other Ambulatory Visit: Payer: Self-pay | Admitting: Internal Medicine

## 2016-10-07 DIAGNOSIS — Z09 Encounter for follow-up examination after completed treatment for conditions other than malignant neoplasm: Principal | ICD-10-CM

## 2016-10-07 DIAGNOSIS — I252 Old myocardial infarction: Secondary | ICD-10-CM

## 2016-10-07 MED FILL — ATORVASTATIN 80 MG TABLET: 80 | 30 days supply | Qty: 30 | Fill #0

## 2016-10-07 MED FILL — ESTRADIOL 1 MG TABLET: 1 | 30 days supply | Qty: 30 | Fill #6

## 2016-10-07 MED FILL — LISINOPRIL 2.5 MG TABLET: 2.5 | 30 days supply | Qty: 30 | Fill #0

## 2016-10-07 MED FILL — metFORMIN HCL 500 MG TABS: 500 | 30 days supply | Qty: 60 | Fill #8

## 2016-10-14 IMAGING — CT CT ABD-PELV W/ CM
2 of 5 series · 10 of 46 positions shown, 11 images · IV contrast (Iodine)
Comparison: None.

CLINICAL DATA: 54-year-old with left sided abdominal pain that
started on [REDACTED].

EXAM:
CT ABDOMEN AND PELVIS WITH CONTRAST
TECHNIQUE: Multidetector CT imaging of the abdomen and pelvis was performed
using the standard protocol following bolus administration of
intravenous contrast.
CONTRAST:  100mL OMNIPAQUE IOHEXOL 300 MG/ML  SOLN

[Series 201: routine, idose (2) · axial · 0.81mm/px · z∈[-386,-16]mm · 7 of 94 slices shown, 8 images]
[im 10/94  soft-tissue]
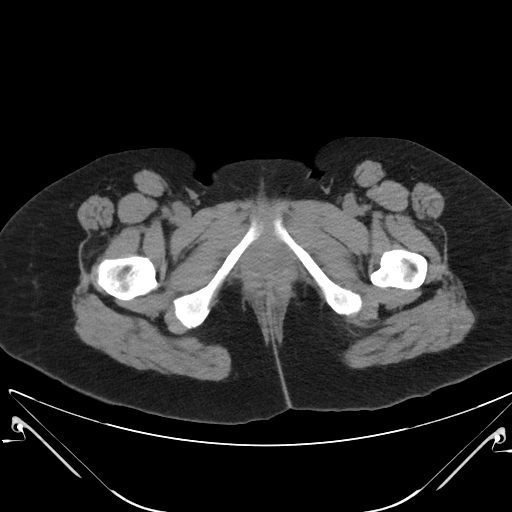
[im 10/94  bone]
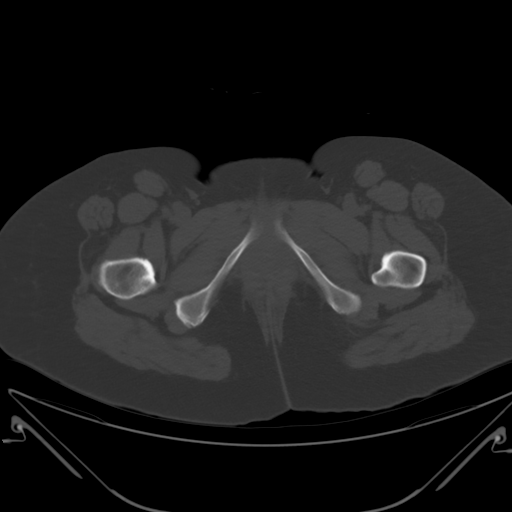
[im 20/94  soft-tissue]
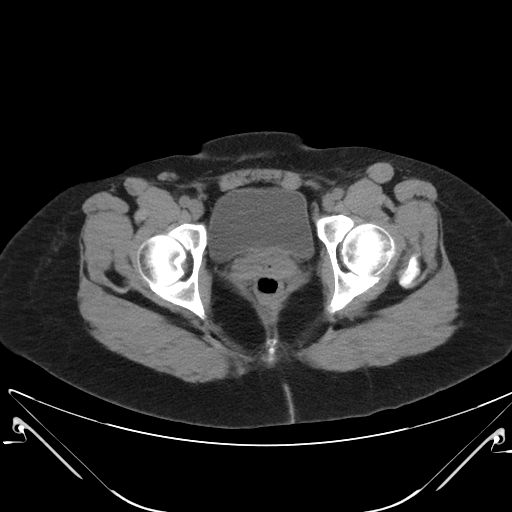
[im 35/94  soft-tissue]
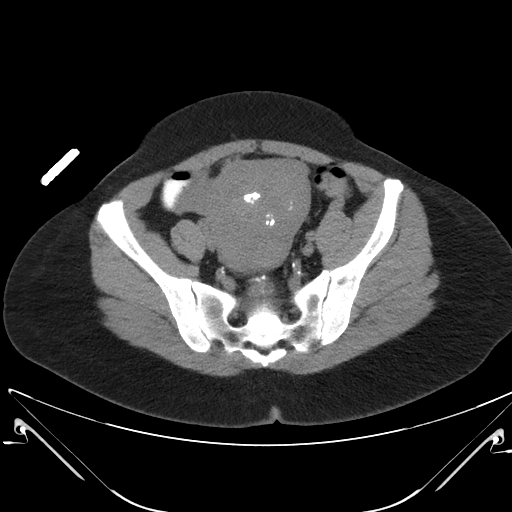
[im 49/94  soft-tissue]
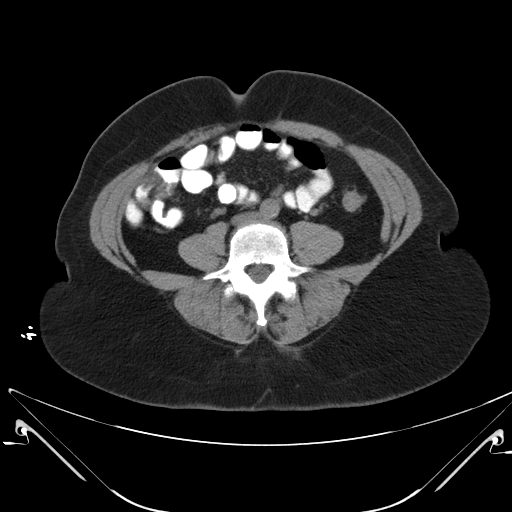
[im 59/94  soft-tissue]
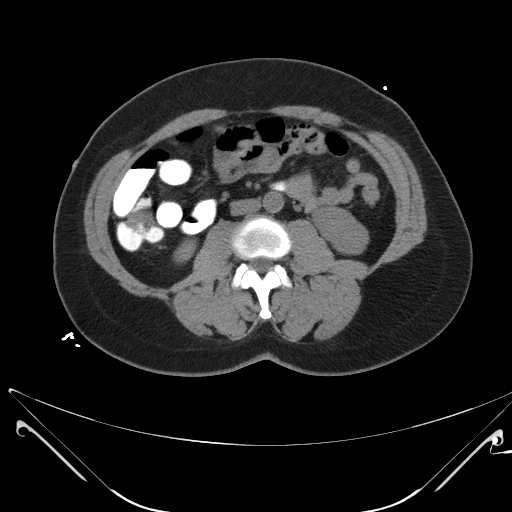
[im 74/94  soft-tissue]
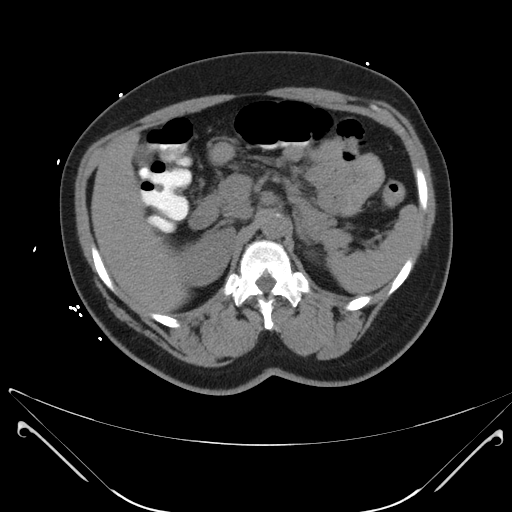
[im 84/94  soft-tissue]
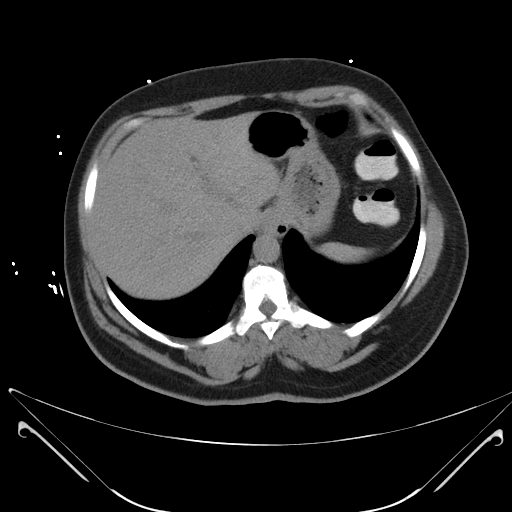

[Series 203: coronals, idose (2) · coronal · 0.45mm/px · 3 of 130 slices shown]
[im 44/130  soft-tissue]
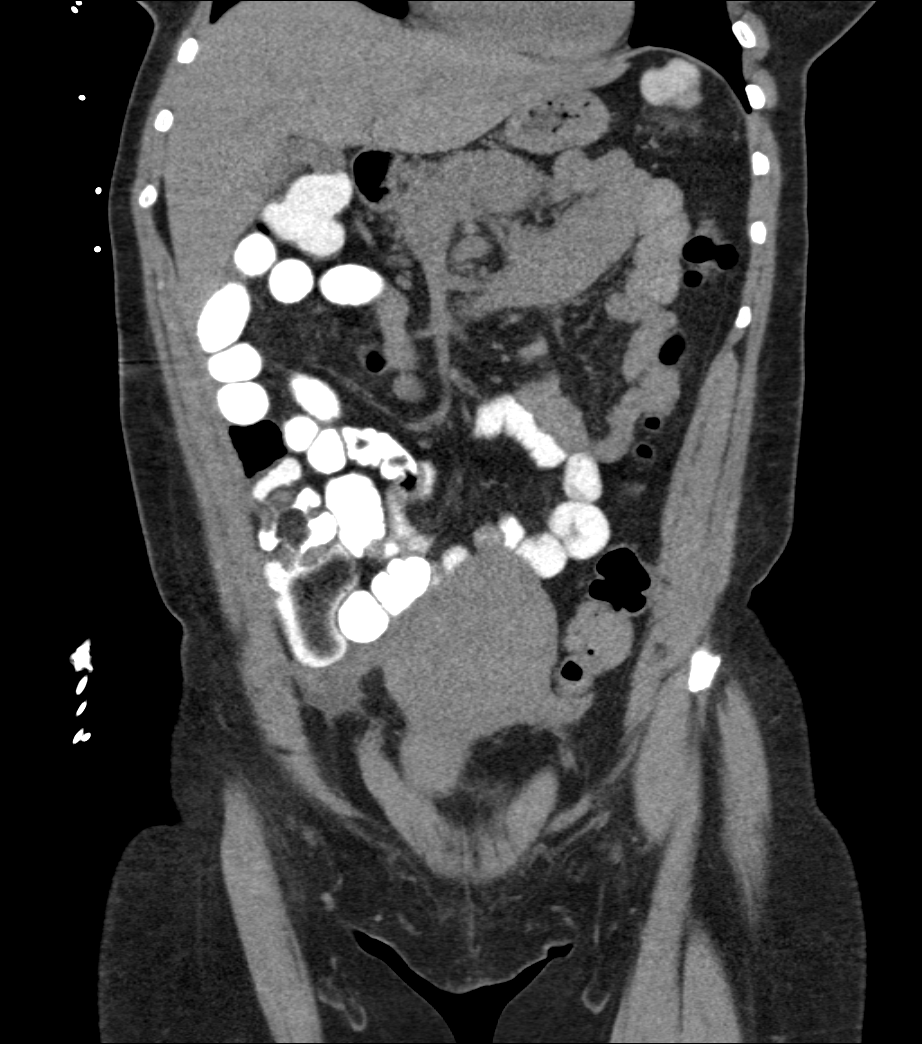
[im 58/130  soft-tissue]
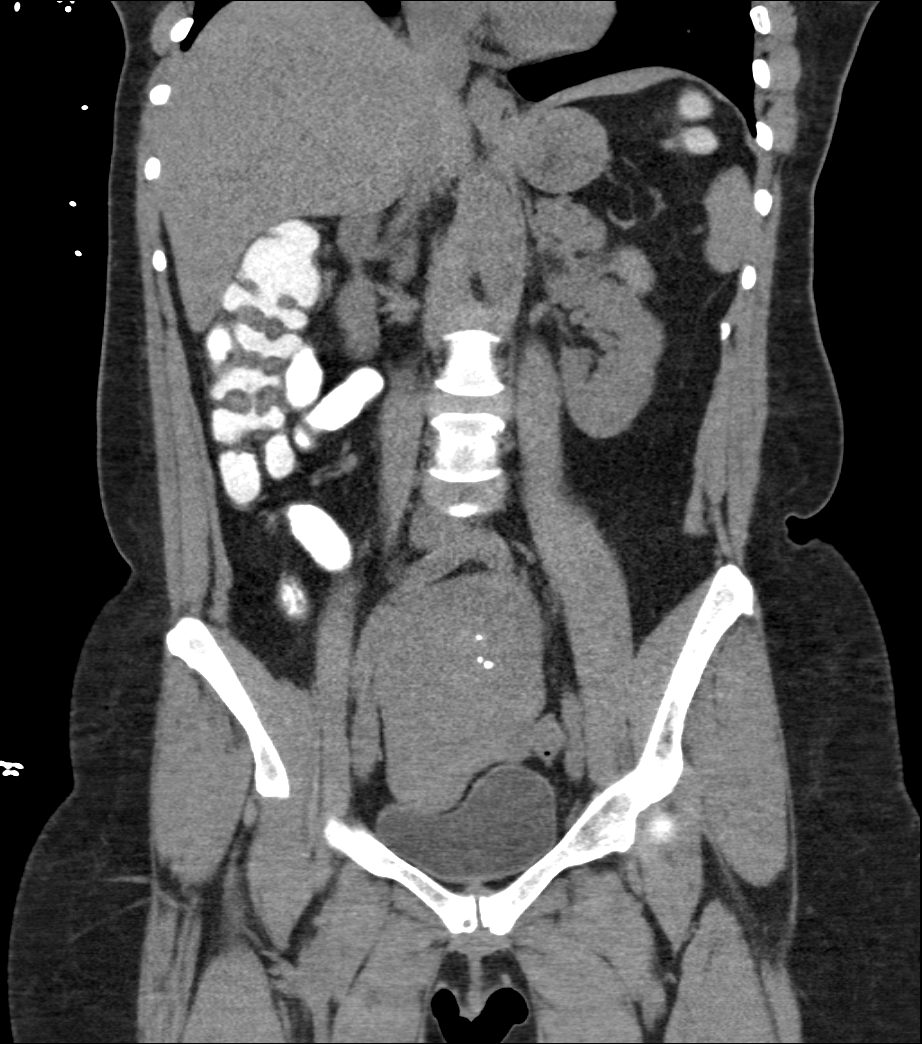
[im 72/130  soft-tissue]
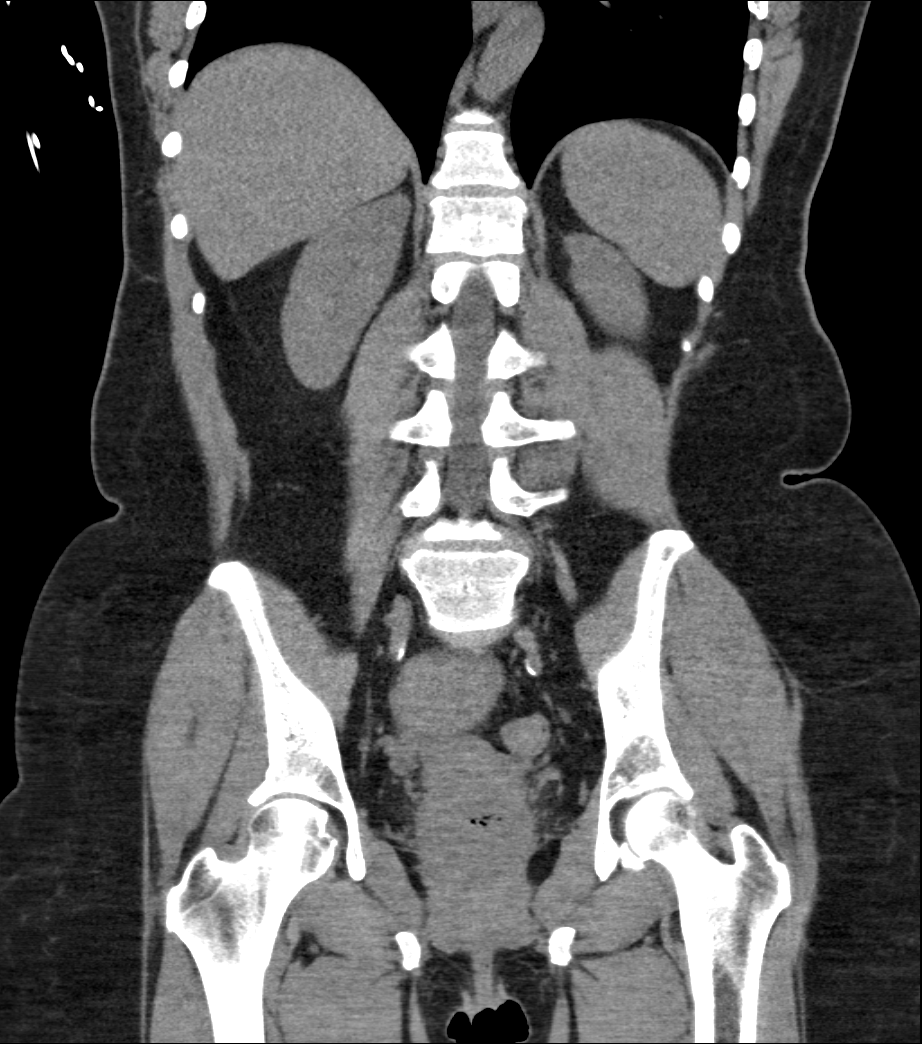

[10 of 46 positions shown; findings below may reference images not displayed]

FINDINGS: The study was performed with intravenous contrast but,
unfortunately, the contrast extravasated in the left upper
extremity. As a result, there was no intravascular contrast within
the abdomen or pelvis. The patient was examined and evaluated
following the procedure. The left upper extremity was soft and non
tender. Mild coolness in the left forearm and wrist. No neurologic
deficits. Tomogram of the chest demonstrates a large amount of
contrast dispersed throughout the left upper extremity and the left
axilla region.

The lung bases are clear.  No evidence for free intraperitoneal air.

Lack of IV contrast limits sensitivity and specificity, especially
for evaluation of abdominal/pelvic solid viscera.

No gross abnormality to the visualized liver. No acute abnormality
to the gallbladder, spleen or pancreas. No gross abnormality to the
adrenal glands or kidneys. No evidence for kidney stones. No acute
abnormality to the stomach or duodenum. Question a small hiatal
hernia. Few calcifications in the abdominal aorta without aneurysm.

The uterus is enlarged, nodular and contain multiple calcifications.
Uterus measures 13.8 cm in length. Findings are compatible with a
fibroid uterus. There is a small amount of fluid near the right
adnexa. Small amount of fluid in the pelvis. Normal appearance of
the urinary bladder.

There is oral contrast in the distal small bowel and colon. No gross
abnormality to the terminal ileum. Evidence for previous
appendectomy. No significant lymphadenopathy in the abdomen or
pelvis.

Bilateral facet arthropathy in the lower lumbar spine. Mild
degenerative changes at the pubic symphysis.
IMPRESSION: No CT findings to explain left abdominal pain.

Fibroid uterus.

Small amount of free fluid may be physiologic.

No intravascular contrast in the abdomen pelvis due to extravasation
in the left upper extremity. Patient was asymptomatic following the
contrast extravasation. Contrast extravasation was discussed with
Dr. Lucianza in the Emergency Department.

## 2016-10-15 IMAGING — US US SOFT TISSUE HEAD/NECK
1 series · 14 of 25 positions shown · non-contrast
Comparison: None.

CLINICAL DATA: Thyroid nodule

EXAM:
THYROID ULTRASOUND
TECHNIQUE: Ultrasound examination of the thyroid gland and adjacent soft
tissues was performed.

[Series 1: us soft tissue head/neck · 0.07mm/px · 56 acquisitions, 14 frames shown]
[im 1/56]
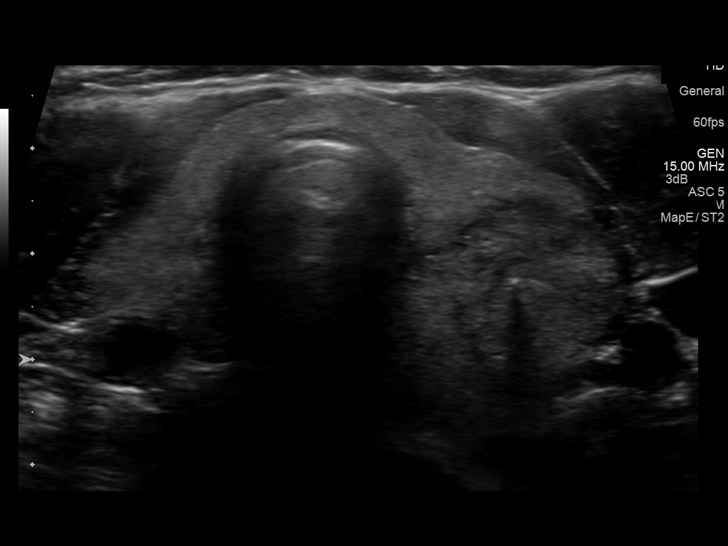
[im 5/56]
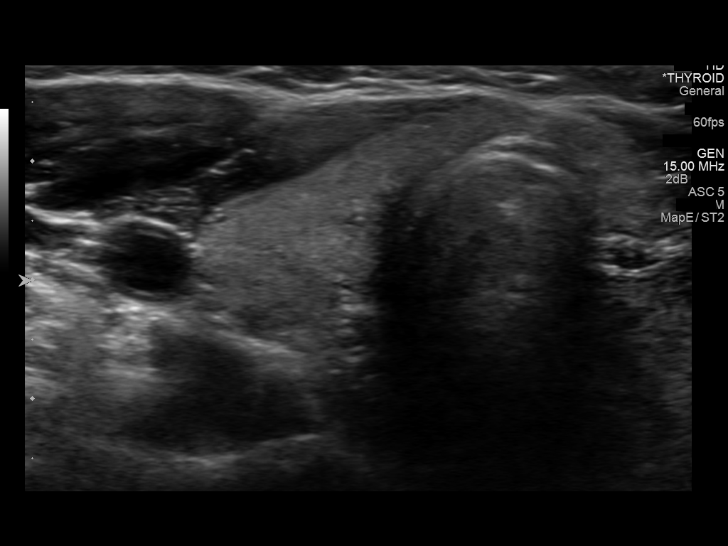
[im 10/56]
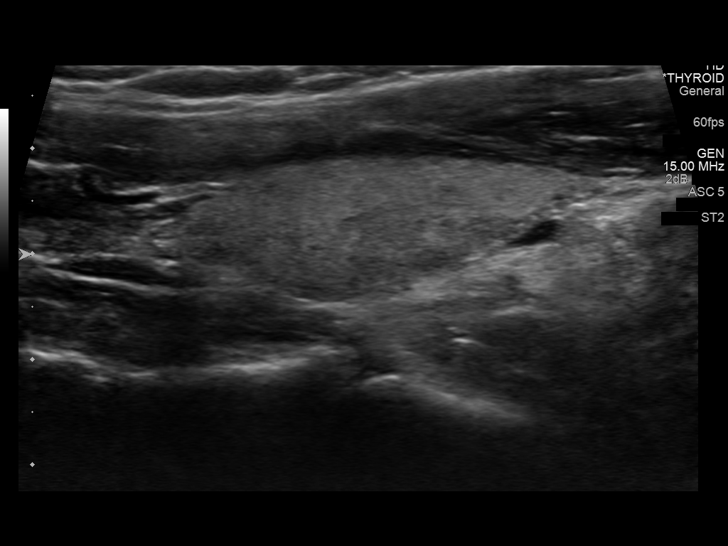
[im 14/56]
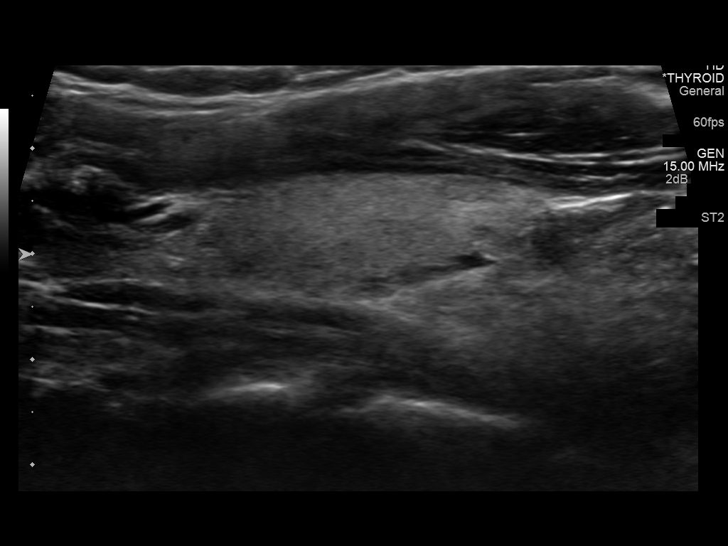
[im 19/56]
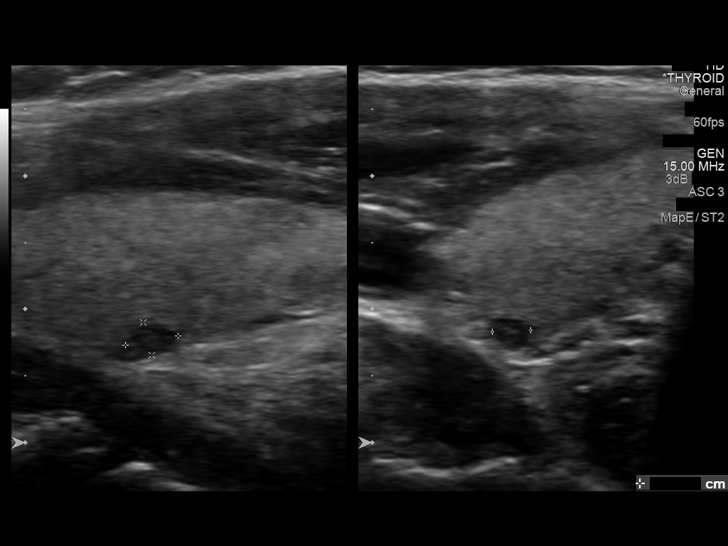
[im 21/56]
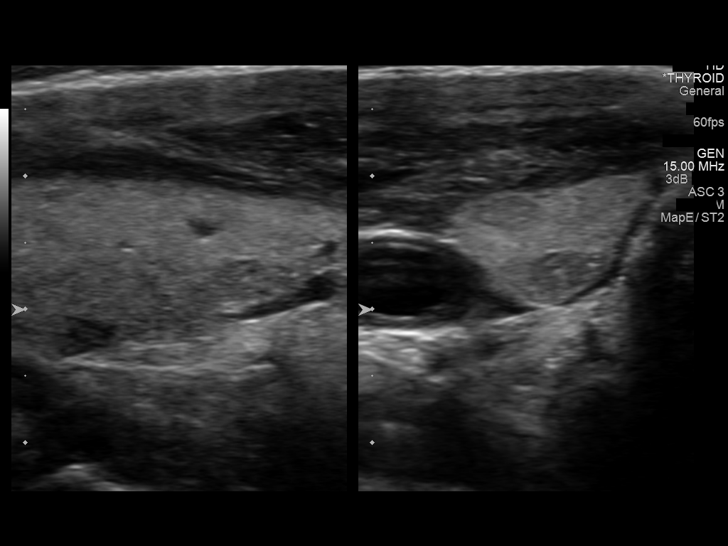
[im 26/56]
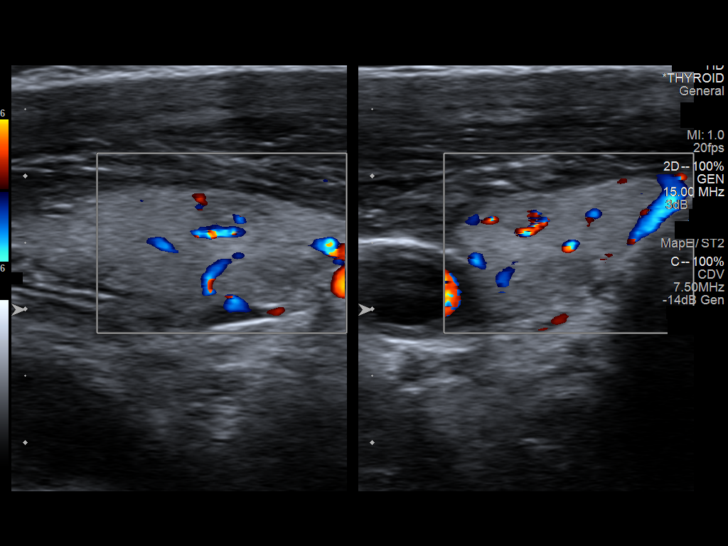
[im 30/56]
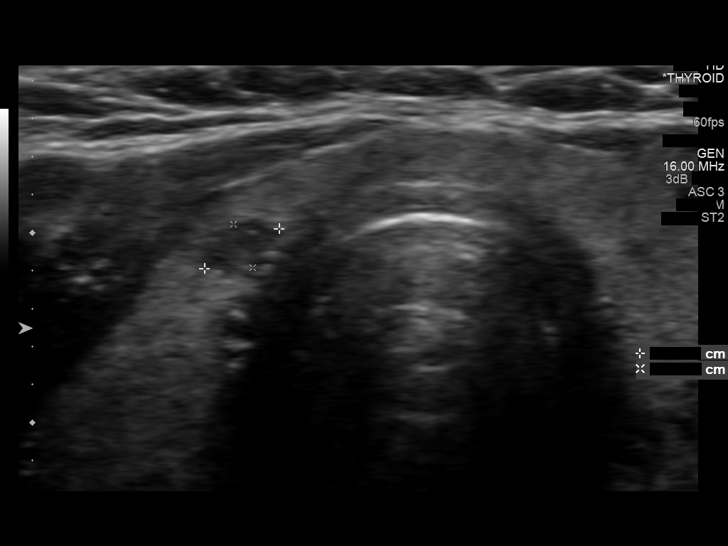
[im 35/56]
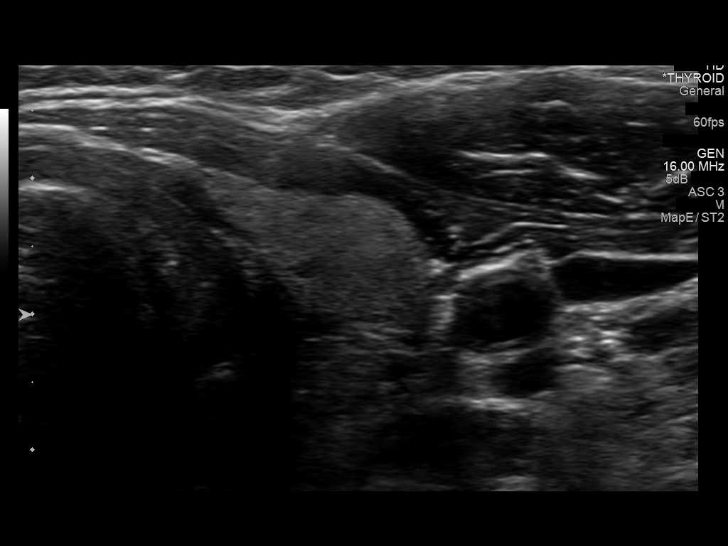
[im 37/56]
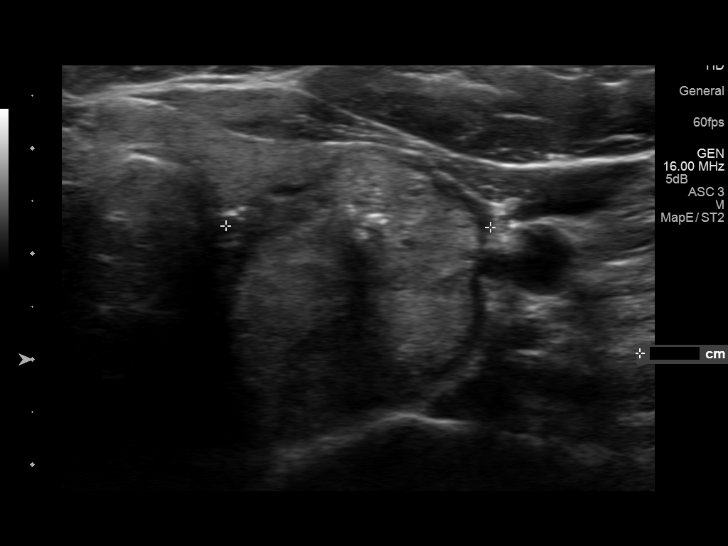
[im 42/56]
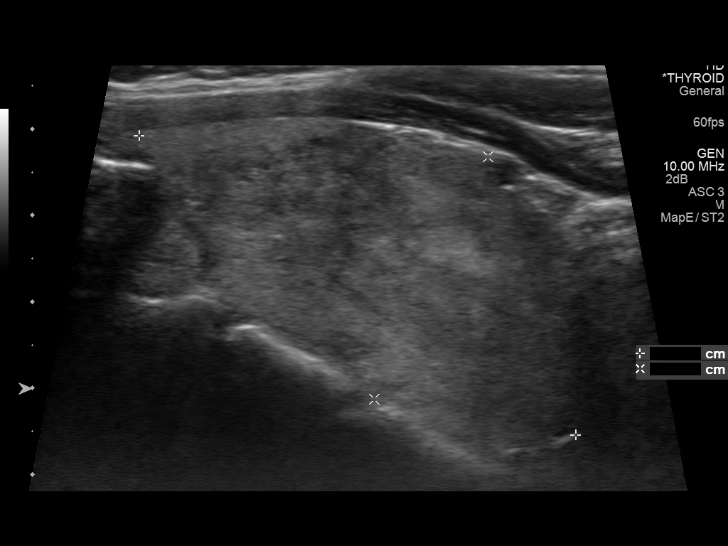
[im 46/56]
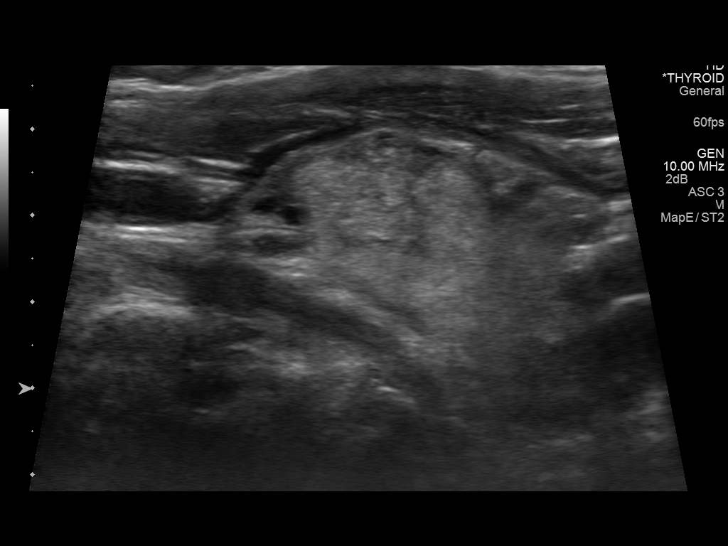
[im 51/56]
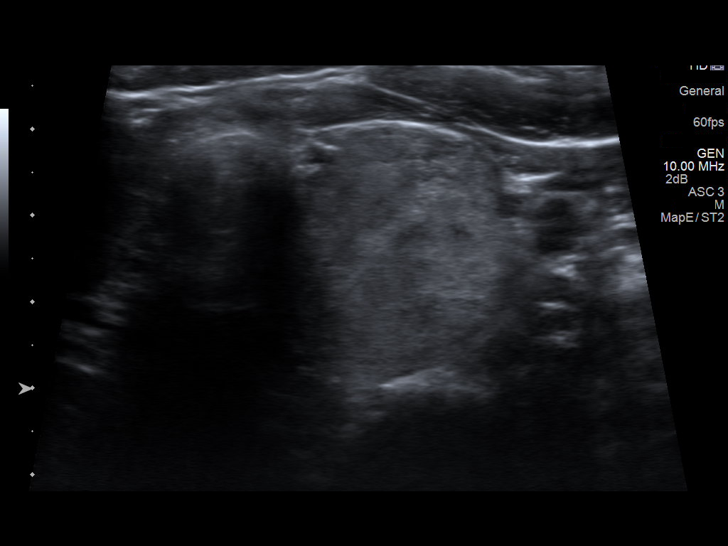
[im 56/56]
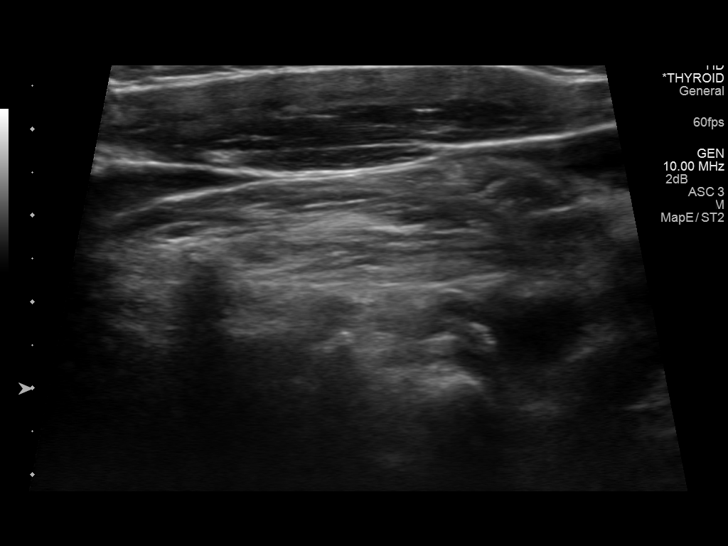

[14 of 25 positions shown; findings below may reference images not displayed]

FINDINGS: Right thyroid lobe

Measurements: 3.6 x 1.3 x 1.5 cm. Several small nodules are seen
throughout the right lobe. 7 mm lower pole nodule. 6 mm lower pole
nodule. 5 mm mid lobe nodule.

Left thyroid lobe

Measurements: 6.1 x 3.1 x 2.5 cm. Large mass on the left measures
5.0 x 3.0 x 2.5 cm. It is solid and heterogeneous with predominantly
peripheral hand anterior vascularity.

Isthmus

Thickness: 3 mm.  No nodules visualized.

Lymphadenopathy

None visualized.
IMPRESSION: Bilateral nodules. Dominant 5 cm left lobe nodule. Findings meet
consensus criteria for biopsy. Ultrasound-guided fine needle
aspiration should be considered, as per the consensus statement:
Management of Thyroid Nodules Detected at US: Society of
Radiologists in Ultrasound Consensus Conference Statement. Radiology

## 2016-10-28 ENCOUNTER — Other Ambulatory Visit: Payer: Self-pay | Admitting: Internal Medicine

## 2016-10-28 DIAGNOSIS — I252 Old myocardial infarction: Secondary | ICD-10-CM

## 2016-10-28 DIAGNOSIS — Z09 Encounter for follow-up examination after completed treatment for conditions other than malignant neoplasm: Principal | ICD-10-CM

## 2016-10-28 MED FILL — METOPROLOL TARTRATE 25 MG T: 25 | 30 days supply | Qty: 60 | Fill #0

## 2016-11-08 ENCOUNTER — Other Ambulatory Visit: Payer: Self-pay | Admitting: Obstetrics & Gynecology

## 2016-11-08 ENCOUNTER — Other Ambulatory Visit: Payer: Self-pay | Admitting: Family Medicine

## 2016-11-08 DIAGNOSIS — Z09 Encounter for follow-up examination after completed treatment for conditions other than malignant neoplasm: Principal | ICD-10-CM

## 2016-11-08 DIAGNOSIS — I252 Old myocardial infarction: Secondary | ICD-10-CM

## 2016-11-08 DIAGNOSIS — N951 Menopausal and female climacteric states: Secondary | ICD-10-CM

## 2016-11-08 MED FILL — FERROUS SULFATE 325 MG TAB: 325 (65 FE) | 90 days supply | Qty: 90 | Fill #3

## 2016-11-08 MED FILL — LISINOPRIL 2.5 MG TABLET: 2.5 | 30 days supply | Qty: 30 | Fill #0

## 2016-11-08 MED FILL — metFORMIN HCL 500 MG TABS: 500 | 30 days supply | Qty: 60 | Fill #9

## 2016-11-08 MED FILL — ATORVASTATIN 80 MG TABLET: 80 | 30 days supply | Qty: 30 | Fill #0

## 2016-11-10 ENCOUNTER — Other Ambulatory Visit: Payer: Self-pay | Admitting: Internal Medicine

## 2016-11-10 DIAGNOSIS — Z09 Encounter for follow-up examination after completed treatment for conditions other than malignant neoplasm: Principal | ICD-10-CM

## 2016-11-10 DIAGNOSIS — I252 Old myocardial infarction: Secondary | ICD-10-CM

## 2016-12-01 ENCOUNTER — Other Ambulatory Visit: Payer: Self-pay | Admitting: Internal Medicine

## 2016-12-01 ENCOUNTER — Other Ambulatory Visit: Payer: Self-pay | Admitting: Physician Assistant

## 2016-12-01 DIAGNOSIS — I252 Old myocardial infarction: Secondary | ICD-10-CM

## 2016-12-01 DIAGNOSIS — Z09 Encounter for follow-up examination after completed treatment for conditions other than malignant neoplasm: Principal | ICD-10-CM

## 2016-12-05 ENCOUNTER — Other Ambulatory Visit: Payer: Self-pay | Admitting: Internal Medicine

## 2016-12-05 ENCOUNTER — Other Ambulatory Visit: Payer: Self-pay | Admitting: Physician Assistant

## 2016-12-05 ENCOUNTER — Other Ambulatory Visit: Payer: Self-pay | Admitting: Pharmacist

## 2016-12-05 DIAGNOSIS — Z09 Encounter for follow-up examination after completed treatment for conditions other than malignant neoplasm: Principal | ICD-10-CM

## 2016-12-05 DIAGNOSIS — I252 Old myocardial infarction: Secondary | ICD-10-CM

## 2016-12-05 MED ORDER — ATORVASTATIN CALCIUM 80 MG PO TABS: ORAL_TABLET | ORAL | 0 refills | Status: DC

## 2016-12-05 MED ORDER — LISINOPRIL 2.5 MG PO TABS: 2.5000 mg | ORAL_TABLET | Freq: Every day | ORAL | 0 refills | Status: DC

## 2016-12-05 MED FILL — LISINOPRIL 2.5 MG TABLET: 2.5 | 30 days supply | Qty: 30 | Fill #0

## 2016-12-05 MED FILL — METOPROLOL TARTRATE 25 MG T: 25 | 30 days supply | Qty: 60 | Fill #0

## 2016-12-05 MED FILL — ATORVASTATIN 80 MG TABLET: 80 | 30 days supply | Qty: 30 | Fill #0

## 2016-12-05 NOTE — Telephone Encounter (Signed)
Please review for refill. Thanks!  

## 2016-12-06 NOTE — Telephone Encounter (Signed)
Rx refill sent to pharmacy. 

## 2016-12-14 ENCOUNTER — Encounter: Payer: Self-pay | Admitting: Family Medicine

## 2016-12-14 ENCOUNTER — Ambulatory Visit (INDEPENDENT_AMBULATORY_CARE_PROVIDER_SITE_OTHER): Payer: Medicaid Other | Admitting: Family Medicine

## 2016-12-14 VITALS — BP 142/84 | HR 54 | Temp 97.9°F | Resp 18 | Ht 66.0 in | Wt 179.0 lb

## 2016-12-14 DIAGNOSIS — N951 Menopausal and female climacteric states: Secondary | ICD-10-CM

## 2016-12-14 DIAGNOSIS — R001 Bradycardia, unspecified: Secondary | ICD-10-CM | POA: Insufficient documentation

## 2016-12-14 DIAGNOSIS — E119 Type 2 diabetes mellitus without complications: Secondary | ICD-10-CM | POA: Diagnosis not present

## 2016-12-14 DIAGNOSIS — I10 Essential (primary) hypertension: Secondary | ICD-10-CM | POA: Diagnosis not present

## 2016-12-14 DIAGNOSIS — Z23 Encounter for immunization: Secondary | ICD-10-CM

## 2016-12-14 DIAGNOSIS — E785 Hyperlipidemia, unspecified: Secondary | ICD-10-CM

## 2016-12-14 DIAGNOSIS — G629 Polyneuropathy, unspecified: Secondary | ICD-10-CM

## 2016-12-14 LAB — CBC WITH DIFFERENTIAL/PLATELET
BASOS ABS: 0 {cells}/uL (ref 0–200)
Basophils Relative: 0 %
EOS PCT: 2 %
Eosinophils Absolute: 112 cells/uL (ref 15–500)
HCT: 42.1 % (ref 35.0–45.0)
Hemoglobin: 13.7 g/dL (ref 11.7–15.5)
Lymphocytes Relative: 46 %
Lymphs Abs: 2576 cells/uL (ref 850–3900)
MCH: 30.2 pg (ref 27.0–33.0)
MCHC: 32.5 g/dL (ref 32.0–36.0)
MCV: 92.7 fL (ref 80.0–100.0)
MONOS PCT: 8 %
MPV: 11.7 fL (ref 7.5–12.5)
Monocytes Absolute: 448 cells/uL (ref 200–950)
NEUTROS ABS: 2464 {cells}/uL (ref 1500–7800)
NEUTROS PCT: 44 %
PLATELETS: 180 10*3/uL (ref 140–400)
RBC: 4.54 MIL/uL (ref 3.80–5.10)
RDW: 14.1 % (ref 11.0–15.0)
WBC: 5.6 10*3/uL (ref 3.8–10.8)

## 2016-12-14 LAB — COMPLETE METABOLIC PANEL WITH GFR
ALT: 22 U/L (ref 6–29)
AST: 25 U/L (ref 10–35)
Albumin: 4.1 g/dL (ref 3.6–5.1)
Alkaline Phosphatase: 86 U/L (ref 33–130)
BUN: 9 mg/dL (ref 7–25)
CO2: 27 mmol/L (ref 20–31)
Calcium: 9.8 mg/dL (ref 8.6–10.4)
Chloride: 106 mmol/L (ref 98–110)
Creat: 0.98 mg/dL (ref 0.50–1.05)
GFR, EST NON AFRICAN AMERICAN: 65 mL/min (ref >=60)
GFR, Est African American: 75 mL/min (ref >=60)
GLUCOSE: 92 mg/dL (ref 65–99)
POTASSIUM: 4.7 mmol/L (ref 3.5–5.3)
SODIUM: 142 mmol/L (ref 135–146)
Total Bilirubin: 0.4 mg/dL (ref 0.2–1.2)
Total Protein: 7 g/dL (ref 6.1–8.1)

## 2016-12-14 LAB — POCT URINALYSIS DIP (DEVICE)
BILIRUBIN URINE: NEGATIVE
Glucose, UA: NEGATIVE mg/dL
Ketones, ur: NEGATIVE mg/dL
Leukocytes, UA: NEGATIVE
NITRITE: NEGATIVE
PH: 7 (ref 5.0–8.0)
PROTEIN: NEGATIVE mg/dL
Specific Gravity, Urine: 1.02 (ref 1.005–1.030)
UROBILINOGEN UA: 0.2 mg/dL (ref 0.0–1.0)

## 2016-12-14 LAB — LIPID PANEL
CHOL/HDL RATIO: 2.4 ratio (ref <5.0)
Cholesterol: 130 mg/dL (ref <200)
HDL: 54 mg/dL (ref >50)
LDL CALC: 61 mg/dL (ref <100)
Triglycerides: 75 mg/dL (ref <150)
VLDL: 15 mg/dL (ref <30)

## 2016-12-14 LAB — TSH: TSH: 0.7 m[IU]/L

## 2016-12-14 LAB — POCT GLYCOSYLATED HEMOGLOBIN (HGB A1C): HEMOGLOBIN A1C: 5.9

## 2016-12-14 MED ORDER — METFORMIN HCL 500 MG PO TABS
500.0000 mg | ORAL_TABLET | Freq: Two times a day (BID) | ORAL | 3 refills | Status: DC
Start: 2016-12-14 — End: 2017-06-14

## 2016-12-14 MED ORDER — ESTRADIOL 1 MG PO TABS: 1.0000 mg | ORAL_TABLET | Freq: Every day | ORAL | 6 refills | Status: DC

## 2016-12-14 MED ORDER — LISINOPRIL 2.5 MG PO TABS: 2.5000 mg | ORAL_TABLET | Freq: Every day | ORAL | 1 refills | Status: DC

## 2016-12-14 MED ORDER — METOPROLOL TARTRATE 25 MG PO TABS: 12.5000 mg | ORAL_TABLET | Freq: Two times a day (BID) | ORAL | 5 refills | Status: DC

## 2016-12-14 MED ORDER — ATORVASTATIN CALCIUM 80 MG PO TABS: ORAL_TABLET | ORAL | 1 refills | Status: DC

## 2016-12-14 MED ORDER — GABAPENTIN 300 MG PO CAPS: 300.0000 mg | ORAL_CAPSULE | Freq: Every day | ORAL | 2 refills | Status: DC

## 2016-12-14 MED FILL — GABAPENTIN 300 MG CAPSULE: 300 | 30 days supply | Qty: 30 | Fill #0

## 2016-12-14 MED FILL — metFORMIN HCL 500 MG TABS: 500 | 30 days supply | Qty: 60 | Fill #0

## 2016-12-14 NOTE — Patient Instructions (Addendum)
Diabetes Mellitus and Food It is important for you to manage your blood sugar (glucose) level. Your blood glucose level can be greatly affected by what you eat. Eating healthier foods in the appropriate amounts throughout the day at about the same time each day will help you control your blood glucose level. It can also help slow or prevent worsening of your diabetes mellitus. Healthy eating may even help you improve the level of your blood pressure and reach or maintain a healthy weight. General recommendations for healthful eating and cooking habits include:  Eating meals and snacks regularly. Avoid going long periods of time without eating to lose weight.  Eating a diet that consists mainly of plant-based foods, such as fruits, vegetables, nuts, legumes, and whole grains.  Using low-heat cooking methods, such as baking, instead of high-heat cooking methods, such as deep frying.  Work with your dietitian to make sure you understand how to use the Nutrition Facts information on food labels. How can food affect me? Carbohydrates Carbohydrates affect your blood glucose level more than any other type of food. Your dietitian will help you determine how many carbohydrates to eat at each meal and teach you how to count carbohydrates. Counting carbohydrates is important to keep your blood glucose at a healthy level, especially if you are using insulin or taking certain medicines for diabetes mellitus. Alcohol Alcohol can cause sudden decreases in blood glucose (hypoglycemia), especially if you use insulin or take certain medicines for diabetes mellitus. Hypoglycemia can be a life-threatening condition. Symptoms of hypoglycemia (sleepiness, dizziness, and disorientation) are similar to symptoms of having too much alcohol. If your health care provider has given you approval to drink alcohol, do so in moderation and use the following guidelines:  Women should not have more than one drink per day, and men  should not have more than two drinks per day. One drink is equal to: ? 12 oz of beer. ? 5 oz of wine. ? 1 oz of hard liquor.  Do not drink on an empty stomach.  Keep yourself hydrated. Have water, diet soda, or unsweetened iced tea.  Regular soda, juice, and other mixers might contain a lot of carbohydrates and should be counted.  What foods are not recommended? As you make food choices, it is important to remember that all foods are not the same. Some foods have fewer nutrients per serving than other foods, even though they might have the same number of calories or carbohydrates. It is difficult to get your body what it needs when you eat foods with fewer nutrients. Examples of foods that you should avoid that are high in calories and carbohydrates but low in nutrients include:  Trans fats (most processed foods list trans fats on the Nutrition Facts label).  Regular soda.  Juice.  Candy.  Sweets, such as cake, pie, doughnuts, and cookies.  Fried foods.  What foods can I eat? Eat nutrient-rich foods, which will nourish your body and keep you healthy. The food you should eat also will depend on several factors, including:  The calories you need.  The medicines you take.  Your weight.  Your blood glucose level.  Your blood pressure level.  Your cholesterol level.  You should eat a variety of foods, including:  Protein. ? Lean cuts of meat. ? Proteins low in saturated fats, such as fish, egg whites, and beans. Avoid processed meats.  Fruits and vegetables. ? Fruits and vegetables that may help control blood glucose levels, such as apples,   mangoes, and yams.  Dairy products. ? Choose fat-free or low-fat dairy products, such as milk, yogurt, and cheese.  Grains, bread, pasta, and rice. ? Choose whole grain products, such as multigrain bread, whole oats, and brown rice. These foods may help control blood pressure.  Fats. ? Foods containing healthful fats, such as  nuts, avocado, olive oil, canola oil, and fish.  Does everyone with diabetes mellitus have the same meal plan? Because every person with diabetes mellitus is different, there is not one meal plan that works for everyone. It is very important that you meet with a dietitian who will help you create a meal plan that is just right for you. This information is not intended to replace advice given to you by your health care provider. Make sure you discuss any questions you have with your health care provider. Document Released: 07/21/2005 Document Revised: 03/31/2016 Document Reviewed: 09/20/2013 Elsevier Interactive Patient Education  2017 [employer] Diabetes and Foot Care Diabetes may cause you to have problems because of poor blood supply (circulation) to your feet and legs. This may cause the skin on your feet to become thinner, break easier, and heal more slowly. Your skin may become dry, and the skin may peel and crack. You may also have nerve damage in your legs and feet causing decreased feeling in them. You may not notice minor injuries to your feet that could lead to infections or more serious problems. Taking care of your feet is one of the most important things you can do for yourself. Follow these instructions at home:  Wear shoes at all times, even in the house. Do not go barefoot. Bare feet are easily injured.  Check your feet daily for blisters, cuts, and redness. If you cannot see the bottom of your feet, use a mirror or ask someone for help.  Wash your feet with warm water (do not use hot water) and mild soap. Then pat your feet and the areas between your toes until they are completely dry. Do not soak your feet as this can dry your skin.  Apply a moisturizing lotion or petroleum jelly (that does not contain alcohol and is unscented) to the skin on your feet and to dry, brittle toenails. Do not apply lotion between your toes.  Trim your toenails straight across. Do not dig under  them or around the cuticle. File the edges of your nails with an emery board or nail file.  Do not cut corns or calluses or try to remove them with medicine.  Wear clean socks or stockings every day. Make sure they are not too tight. Do not wear knee-high stockings since they may decrease blood flow to your legs.  Wear shoes that fit properly and have enough cushioning. To break in new shoes, wear them for just a few hours a day. This prevents you from injuring your feet. Always look in your shoes before you put them on to be sure there are no objects inside.  Do not cross your legs. This may decrease the blood flow to your feet.  If you find a minor scrape, cut, or break in the skin on your feet, keep it and the skin around it clean and dry. These areas may be cleansed with mild soap and water. Do not cleanse the area with peroxide, alcohol, or iodine.  When you remove an adhesive bandage, be sure not to damage the skin around it.  If you have a wound, look at it several times   to make sure it is healing.  Do not use heating pads or hot water bottles. They may burn your skin. If you have lost feeling in your feet or legs, you may not know it is happening until it is too late.  Make sure your health care provider performs a complete foot exam at least annually or more often if you have foot problems. Report any cuts, sores, or bruises to your health care provider immediately. Contact a health care provider if:  You have an injury that is not healing.  You have cuts or breaks in the skin.  You have an ingrown nail.  You notice redness on your legs or feet.  You feel burning or tingling in your legs or feet.  You have pain or cramps in your legs and feet.  Your legs or feet are numb.  Your feet always feel cold. Get help right away if:  There is increasing redness, swelling, or pain in or around a wound.  There is a red line that goes up your leg.  Pus is coming from a  wound.  You develop a fever or as directed by your health care provider.  You notice a bad smell coming from an ulcer or wound. This information is not intended to replace advice given to you by your health care provider. Make sure you discuss any questions you have with your health care provider. Document Released: 10/21/2000 Document Revised: 03/31/2016 Document Reviewed: 04/02/2013 Elsevier Interactive Patient Education  2017 [employer].  High Cholesterol High cholesterol is a condition in which the blood has high levels of a white, waxy, fat-like substance (cholesterol). The human body needs small amounts of cholesterol. The liver makes all the cholesterol that the body needs. Extra (excess) cholesterol comes from the food that we eat. Cholesterol is carried from the liver by the blood through the blood vessels. If you have high cholesterol, deposits (plaques) may build up on the walls of your blood vessels (arteries). Plaques make the arteries narrower and stiffer. Cholesterol plaques increase your risk for heart attack and stroke. Work with your health care provider to keep your cholesterol levels in a healthy range. What increases the risk? This condition is more likely to develop in people who:  Eat foods that are high in animal fat (saturated fat) or cholesterol.  Are overweight.  Are not getting enough exercise.  Have a family history of high cholesterol. What are the signs or symptoms? There are no symptoms of this condition. How is this diagnosed? This condition may be diagnosed from the results of a blood test.  If you are older than age 66, your health care provider may check your cholesterol every 4-6 years.  You may be checked more often if you already have high cholesterol or other risk factors for heart disease. The blood test for cholesterol measures:  "Bad" cholesterol (LDL cholesterol). This is the main type of cholesterol that causes heart disease. The desired  level for LDL is less than 100.  "Good" cholesterol (HDL cholesterol). This type helps to protect against heart disease by cleaning the arteries and carrying the LDL away. The desired level for HDL is 60 or higher.  Triglycerides. These are fats that the body can store or burn for energy. The desired number for triglycerides is lower than 150.  Total cholesterol. This is a measure of the total amount of cholesterol in your blood, including LDL cholesterol, HDL cholesterol, and triglycerides. A healthy number is less than  200. How is this treated? This condition is treated with diet changes, lifestyle changes, and medicines. Diet changes  This may include eating more whole grains, fruits, vegetables, nuts, and fish.  This may also include cutting back on red meat and foods that have a lot of added sugar. Lifestyle changes  Changes may include getting at least 40 minutes of aerobic exercise 3 times a week. Aerobic exercises include walking, biking, and swimming. Aerobic exercise along with a healthy diet can help you maintain a healthy weight.  Changes may also include quitting smoking. Medicines  Medicines are usually given if diet and lifestyle changes have failed to reduce your cholesterol to healthy levels.  Your health care provider may prescribe a statin medicine. Statin medicines have been shown to reduce cholesterol, which can reduce the risk of heart disease. Follow these instructions at home: Eating and drinking If told by your health care provider:  Eat chicken (without skin), fish, veal, shellfish, ground [place] breast, and round or loin cuts of red meat.  Do not eat fried foods or fatty meats, such as hot dogs and salami.  Eat plenty of fruits, such as apples.  Eat plenty of vegetables, such as broccoli, potatoes, and carrots.  Eat beans, peas, and lentils.  Eat grains such as barley, rice, couscous, and bulgur wheat.  Eat pasta without cream sauces.  Use skim or  nonfat milk, and eat low-fat or nonfat yogurt and cheeses.  Do not eat or drink whole milk, cream, ice cream, egg yolks, or hard cheeses.  Do not eat stick margarine or tub margarines that contain trans fats (also called partially hydrogenated oils).  Do not eat saturated tropical oils, such as coconut oil and palm oil.  Do not eat cakes, cookies, crackers, or other baked goods that contain trans fats. General instructions  Exercise as directed by your health care provider. Increase your activity level with activities such as gardening, walking, and taking the stairs.  Take over-the-counter and prescription medicines only as told by your health care provider.  Do not use any products that contain nicotine or tobacco, such as cigarettes and e-cigarettes. If you need help quitting, ask your health care provider.  Keep all follow-up visits as told by your health care provider. This is important. Contact a health care provider if:  You are struggling to maintain a healthy diet or weight.  You need help to start on an exercise program.  You need help to stop smoking. Get help right away if:  You have chest pain.  You have trouble breathing. This information is not intended to replace advice given to you by your health care provider. Make sure you discuss any questions you have with your health care provider. Document Released: 10/24/2005 Document Revised: 05/21/2016 Document Reviewed: 04/23/2016 Elsevier Interactive Patient Education  2017 [employer].

## 2016-12-14 NOTE — Progress Notes (Signed)
Subjective:    Patient ID: Virginia [name], female    DOB: [birth year], 56 y.o.   MRN: [id number]  Diabetes  She presents for her follow-up diabetic visit. She has type 2 diabetes mellitus. Her disease course has been stable. There are no hypoglycemic associated symptoms. Pertinent negatives for hypoglycemia include no headaches or sweats. Associated symptoms include foot paresthesias (right great toe and left upper extremity). Pertinent negatives for diabetes include no blurred vision, no chest pain, no fatigue, no polydipsia, no polyphagia, no polyuria, no visual change, no weakness and no weight loss. Hypoglycemia complications include blackouts. Symptoms are improving. Diabetic complications include peripheral neuropathy. Pertinent negatives for diabetic complications include no retinopathy. Risk factors for coronary artery disease include dyslipidemia, sedentary lifestyle and post-menopausal. Current diabetic treatment includes oral agent (monotherapy). She is compliant with treatment all of the time. Her weight is stable. She is following a diabetic diet. She participates in exercise intermittently. An ACE inhibitor/angiotensin II receptor blocker is being taken. She does not see a podiatrist.Eye exam is current.  Hypertension  This is a chronic problem. The current episode started more than 1 year ago. The problem is controlled. Pertinent negatives include no blurred vision, chest pain, headaches, neck pain, orthopnea, palpitations, peripheral edema, PND, shortness of breath or sweats. There are no associated agents to hypertension. Risk factors for coronary artery disease include dyslipidemia and post-menopausal state. Past treatments include ACE inhibitors and beta blockers. Compliance problems include diet.  There is no history of kidney disease, CAD/MI, heart failure, left ventricular hypertrophy or retinopathy. There is no history of coarctation of the aorta, hyperaldosteronism, hypercortisolism,  hyperparathyroidism, sleep apnea or a thyroid problem.  Hyperlipidemia  This is a chronic problem. The problem is controlled. Recent lipid tests were reviewed and are high. Exacerbating diseases include diabetes. Associated symptoms include myalgias. Pertinent negatives include no chest pain, focal sensory loss, focal weakness, leg pain or shortness of breath. Current antihyperlipidemic treatment includes statins and diet change. The current treatment provides significant improvement of lipids. There are no compliance problems.  Risk factors for coronary artery disease include dyslipidemia and post-menopausal.     Past Medical History:  Diagnosis Date  . Anemia   . CAD (coronary artery disease)    a. 06/2015 BMS x2 to LCx. b. recurrent CP, cath 09/18/2015 patent stent, tiny diag stenosis medical therapy.   . CHF (congestive heart failure) ([place])   . Diabetes mellitus without complication ([place])   . GERD (gastroesophageal reflux disease)    ?  Marland Kitchen Hyperlipidemia with target LDL less than 70   . Hypertension   . Shortness of breath dyspnea    with exertion  . STEMI (ST elevation myocardial infarction) ([place]) 06/16/2015   BMS x 2 CFX  . Thyroid nodule    benign   Immunization History  Administered Date(s) Administered  . Influenza,inj,Quad PF,36+ Mos 09/18/2015  . Pneumococcal Polysaccharide-23 09/18/2015  . Tdap 03/21/2012    Diabetic Foot Exam - Simple   Simple Foot Form Diabetic Foot exam was performed with the following findings:  Yes 12/14/2016 11:42 AM  Visual Inspection No deformities, no ulcerations, no other skin breakdown bilaterally:  Yes Sensation Testing Intact to touch and monofilament testing bilaterally:  Yes Pulse Check Posterior Tibialis and Dorsalis pulse intact bilaterally:  Yes Comments   Body mass index is 28.89 kg/[name]. Review of Systems  Constitutional: Negative for fatigue, unexpected weight change and weight loss.  HENT: Negative.   Eyes: Negative.  Negative  for  blurred vision.  Respiratory: Negative for cough, chest tightness, shortness of breath and wheezing.   Cardiovascular: Negative for chest pain, palpitations, orthopnea, leg swelling and PND.  Gastrointestinal: Negative.   Endocrine: Negative.  Negative for polydipsia, polyphagia and polyuria.  Musculoskeletal: Positive for myalgias. Negative for neck pain.  Skin: Negative.   Allergic/Immunologic: Negative for immunocompromised state.  Neurological: Negative.  Negative for focal weakness, weakness and headaches.  Hematological: Negative.   Psychiatric/Behavioral: Negative.         Objective:   Physical Exam  Constitutional: She is oriented to person, place, and time.  HENT:  Head: Normocephalic and atraumatic.  Right Ear: External ear normal.  Left Ear: External ear normal.  Nose: Nose normal.  Mouth/Throat: Oropharynx is clear and moist.  Eyes: Conjunctivae and EOM are normal. Pupils are equal, round, and reactive to light. No scleral icterus.  Neck: Normal range of motion. Neck supple.  Cardiovascular: Normal rate, normal heart sounds and intact distal pulses.   Pulmonary/Chest: Effort normal and breath sounds normal.  Abdominal: Soft. Bowel sounds are normal.  Genitourinary: Vagina normal and uterus normal.  Neurological: She is alert and oriented to person, place, and time. She has normal reflexes.  Monofilament test negative  Skin: Skin is warm and dry.  Psychiatric: She has a normal mood and affect. Her behavior is normal. Judgment and thought content normal.  Vitals reviewed.    BP (!) 142/84 (BP Location: Right Arm, Patient Position: Sitting, Cuff Size: Normal)   Pulse (!) 54   Temp 97.9 F (36.6 C) (Oral)   Resp 18   Ht 5\' 6"  (1.676 [name])   Wt 179 lb (81.2 kg)   LMP 12/29/2015   BMI 28.89 kg/[name]  Assessment & Plan:  1. Controlled type 2 diabetes mellitus without complication, without long-term current use of insulin (HCC) - HgB A1c - lisinopril  (PRINIVIL,ZESTRIL) 2.5 MG tablet; Take 1 tablet (2.5 mg total) by mouth daily.  Dispense: 90 tablet; Refill: 1 - metFORMIN (GLUCOPHAGE) 500 MG tablet; Take 1 tablet (500 mg total) by mouth 2 (two) times daily with a meal.  Dispense: 180 tablet; Refill: 3 - CBC with Differential - COMPLETE METABOLIC PANEL WITH GFR  2. Essential hypertension Blood pressure is at goal on current medication regimen.  - POCT urinalysis dip (device) - lisinopril (PRINIVIL,ZESTRIL) 2.5 MG tablet; Take 1 tablet (2.5 mg total) by mouth daily.  Dispense: 90 tablet; Refill: 1 - metoprolol tartrate (LOPRESSOR) 25 MG tablet; Take 0.5 tablets (12.5 mg total) by mouth 2 (two) times daily.  Dispense: 60 tablet; Refill: 5  3. Menopausal hot flushes - estradiol (ESTRACE) 1 MG tablet; Take 1 tablet (1 mg total) by mouth daily.  Dispense: 30 tablet; Refill: 6  4. Hyperlipidemia with target LDL less than 70 Recommend a lowfat, low carbohydrate diet divided over 5-6 small meals, increase water intake to 6-8 glasses, and 150 minutes per week of cardiovascular exercise.   - atorvastatin (LIPITOR) 80 MG tablet; TAKE 1 TABLET BY MOUTH DAILY AT 6 PM  Dispense: 90 tablet; Refill: 1 - Lipid Panel  5. Bradycardia Will titrate Metoprolol, reduced dosage to 12.5 mg. Will follow up in 2 weeks for a check of blood pressure and pulse.  - TSH  6. Neuropathy (HCC)  - gabapentin (NEURONTIN) 300 MG capsule; Take 1 capsule (300 mg total) by mouth at bedtime.  Dispense: 30 capsule; Refill: 2  7. Influenza vaccine needed  - Flu Vaccine QUAD 36+ mos PF IM (Fluarix & Fluzone  Quad PF)   RTC: Follow up in 2 weeks for bp check. Follow up in 3 months for chronic conditions   Virginia Guardiola M, FNP    The patient was given clear instructions to go to ER or return to medical center if symptoms do not improve, worsen or new problems develop. The patient verbalized understanding. Will notify patient with laboratory results.

## 2016-12-15 MED FILL — ESTRADIOL 1 MG TABLET: 1 | 30 days supply | Qty: 30 | Fill #0

## 2016-12-15 MED FILL — ISOSORBIDE MN ER 30 MG TAB: 30 | 30 days supply | Qty: 30 | Fill #0

## 2016-12-28 ENCOUNTER — Ambulatory Visit (INDEPENDENT_AMBULATORY_CARE_PROVIDER_SITE_OTHER): Payer: Medicaid Other

## 2016-12-28 VITALS — BP 128/62

## 2016-12-28 DIAGNOSIS — Z0130 Encounter for examination of blood pressure without abnormal findings: Secondary | ICD-10-CM | POA: Diagnosis not present

## 2016-12-28 NOTE — Progress Notes (Signed)
Blood pressure was 128/62 manual. Patient was advised to keep taking medications as prescribed and keep next appointment for follow up. Thanks!

## 2017-01-02 IMAGING — DX DG CHEST 2V
2 series · 2 of 2 positions shown · non-contrast
Comparison: December 20, 2011

CLINICAL DATA: Shortness of breath and chest tightness for 1 day

EXAM:
CHEST  2 VIEW

[w chest pa]
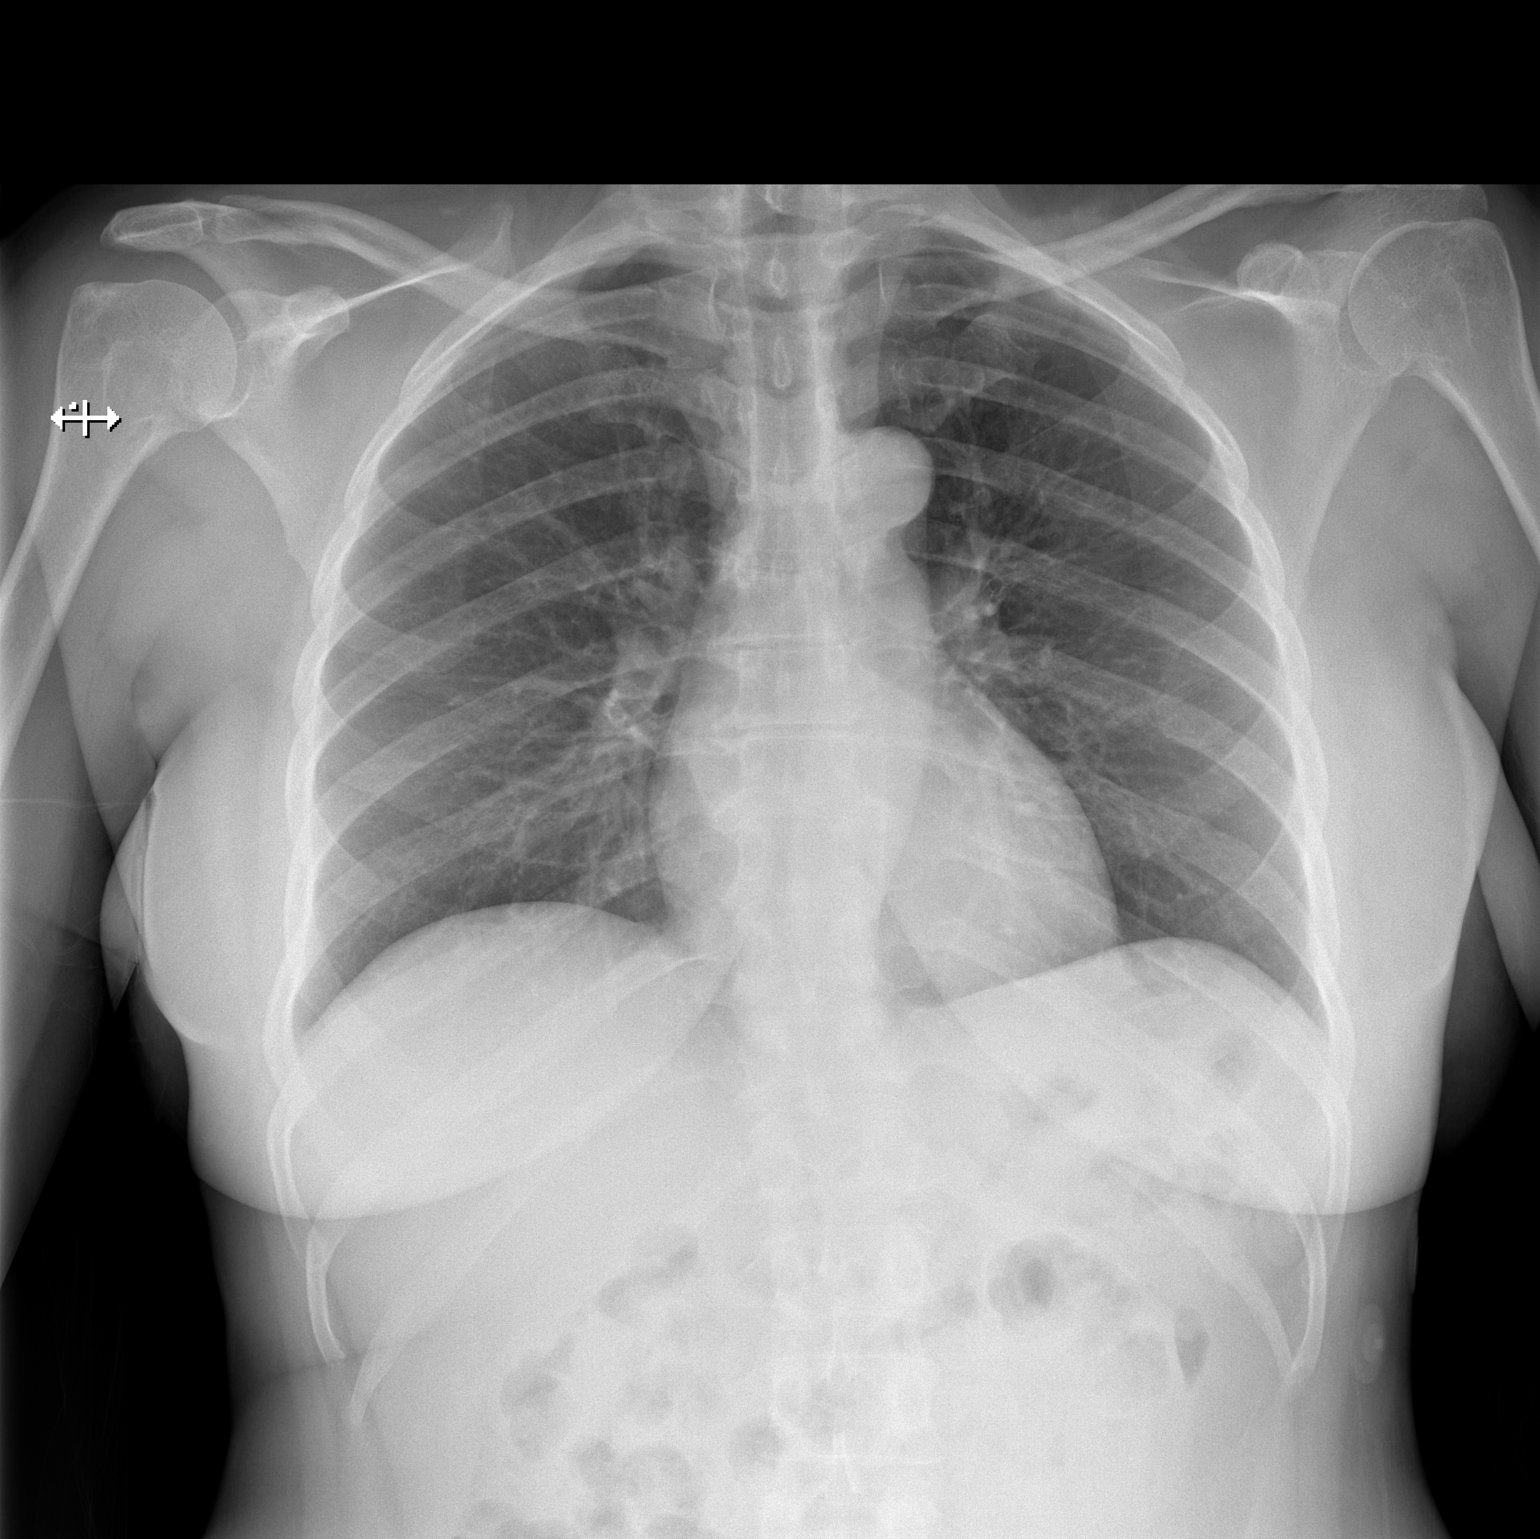

[w chest lat]
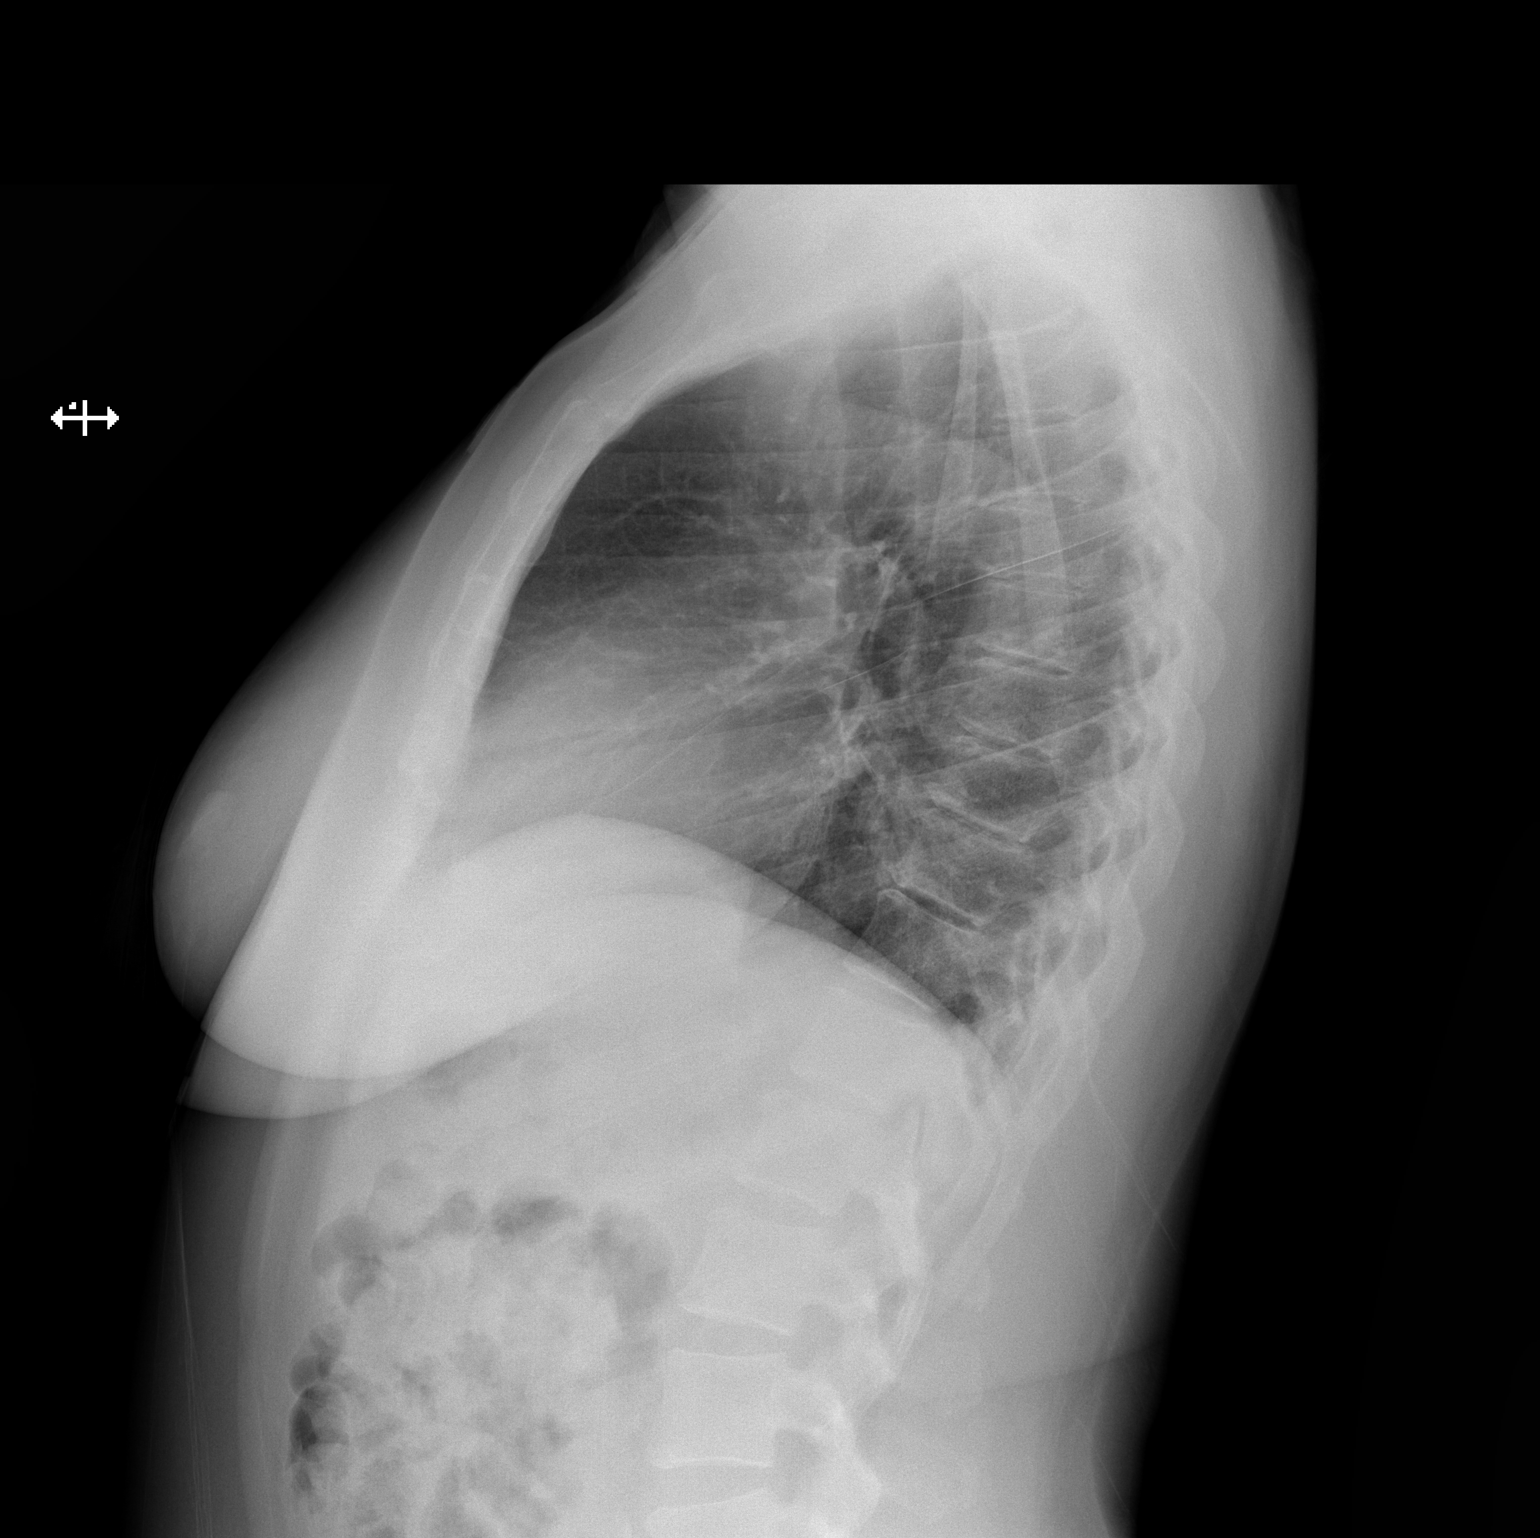

[2 of 2 positions shown; findings below may reference images not displayed]

FINDINGS: There is no edema or consolidation. The heart size and pulmonary
vascularity are normal. No adenopathy. No pneumothorax. No bone
lesions.
IMPRESSION: No edema or consolidation.

## 2017-01-08 IMAGING — US US PELVIS COMPLETE
1 series · 14 of 25 positions shown · non-contrast
Comparison: CT, 06/29/2015

CLINICAL DATA: Dysfunctional uterine bleeding.

EXAM:
TRANSABDOMINAL AND TRANSVAGINAL ULTRASOUND OF PELVIS
TECHNIQUE: Both transabdominal and transvaginal ultrasound examinations of the
pelvis were performed. Transabdominal technique was performed for
global imaging of the pelvis including uterus, ovaries, adnexal
regions, and pelvic cul-de-sac. It was necessary to proceed with
endovaginal exam following the transabdominal exam to visualize the
uterus and right adnexa to better advantage.

[Series 1: us pelvis complete · 0.21mm/px · 14 of 52 slices shown]
[im 1/52]
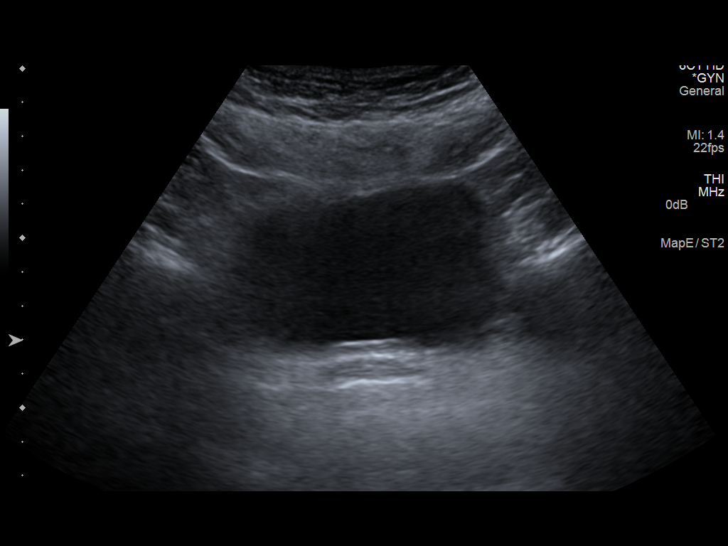
[im 5/52]
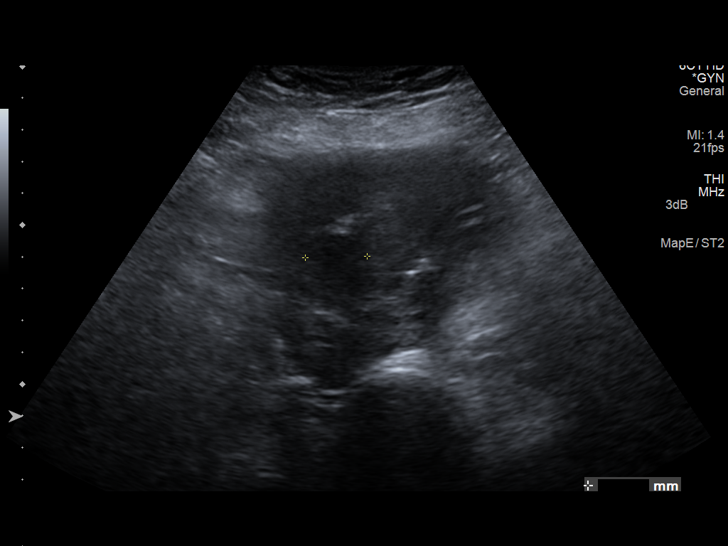
[im 9/52]
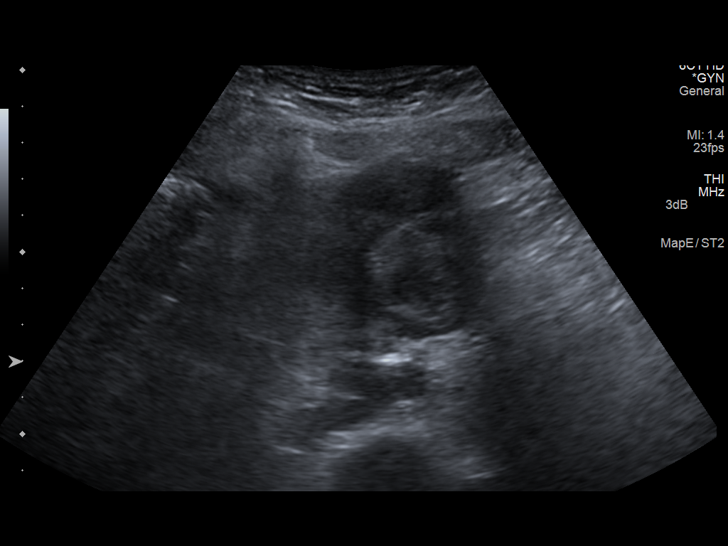
[im 13/52]
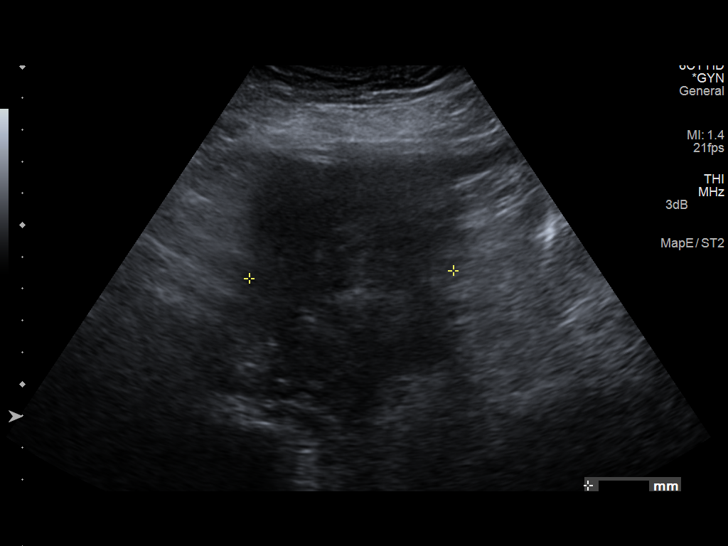
[im 18/52]
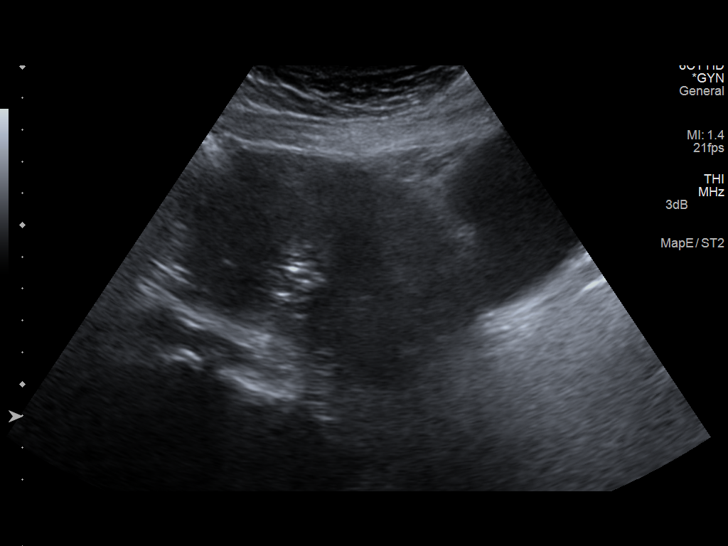
[im 20/52]
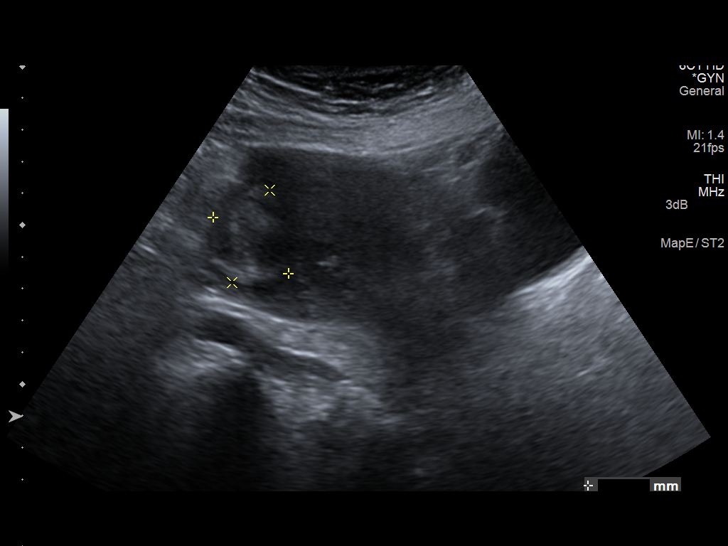
[im 24/52]
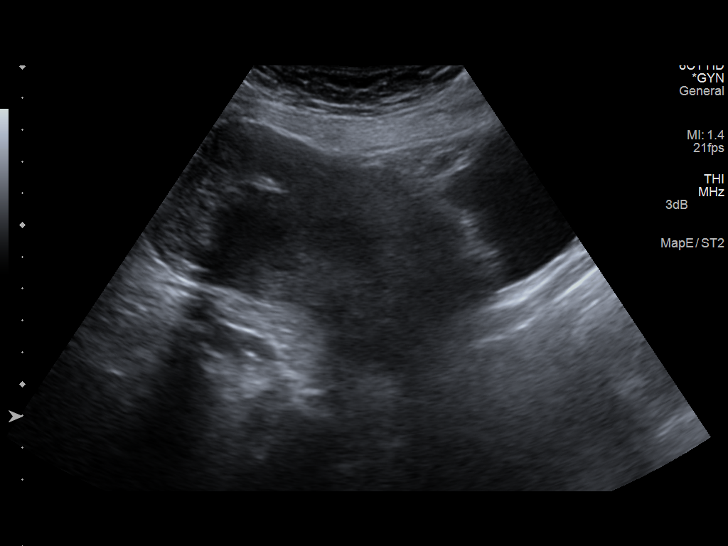
[im 28/52]
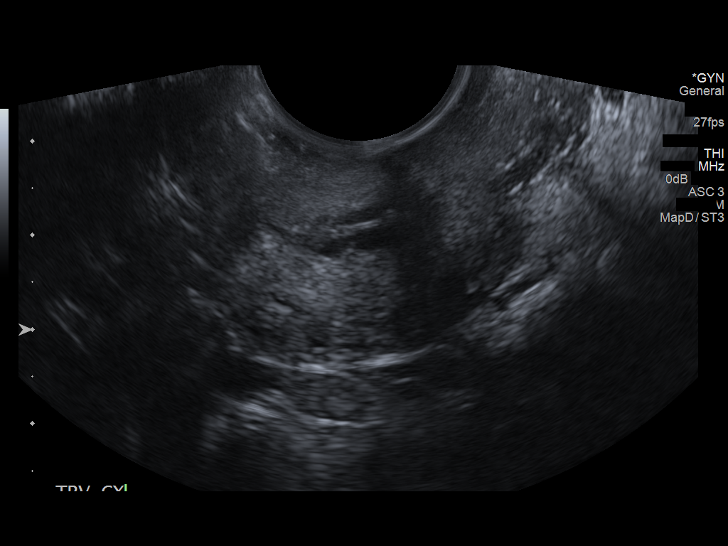
[im 32/52]
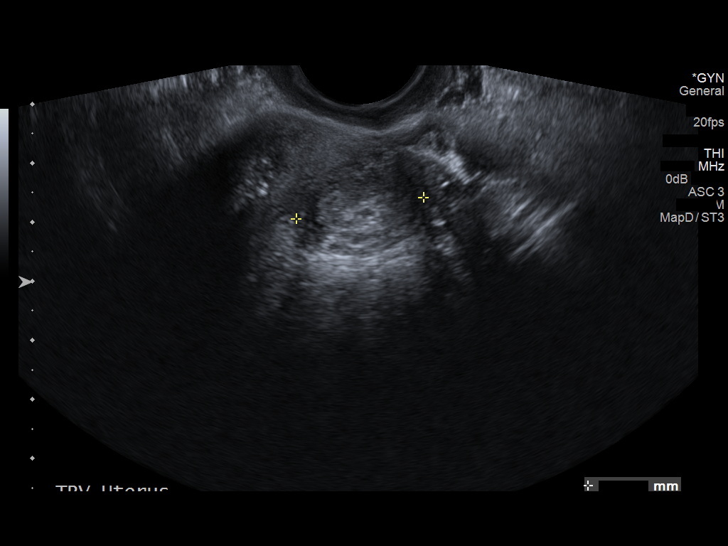
[im 35/52]
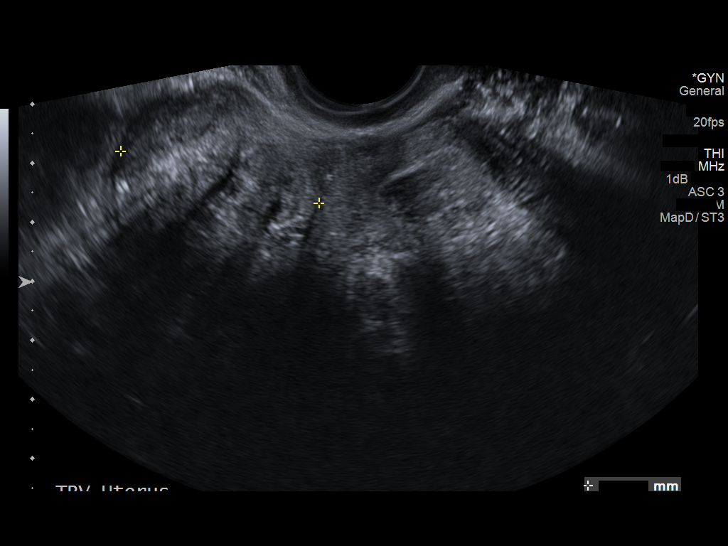
[im 39/52]
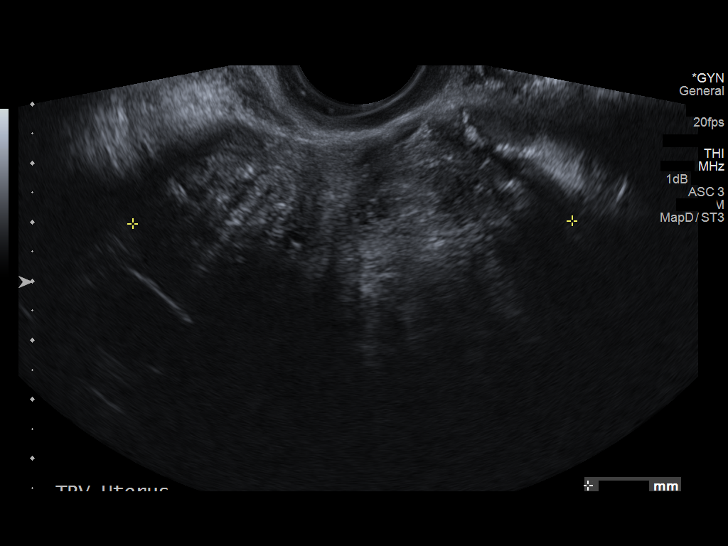
[im 43/52]
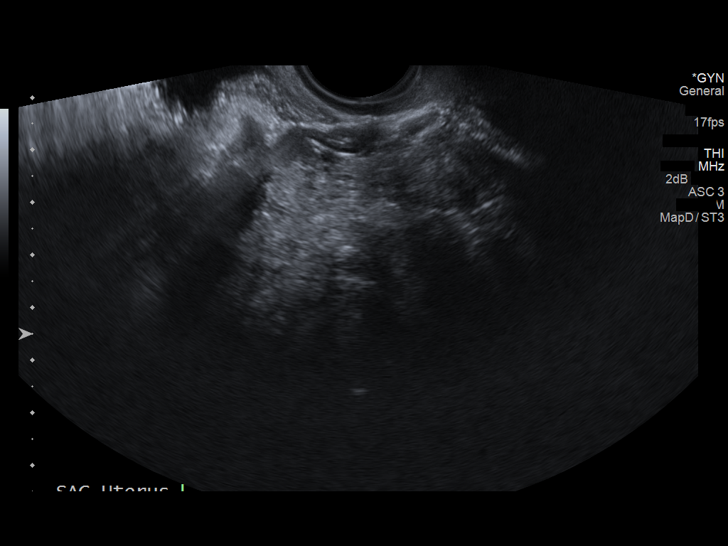
[im 47/52]
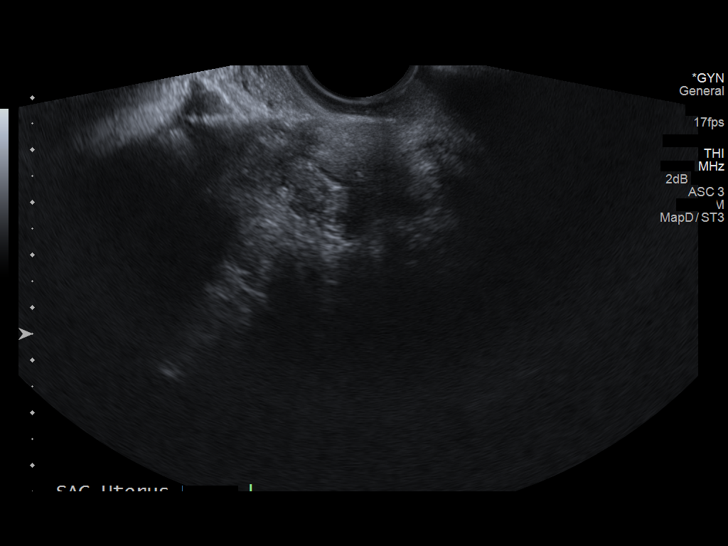
[im 52/52]
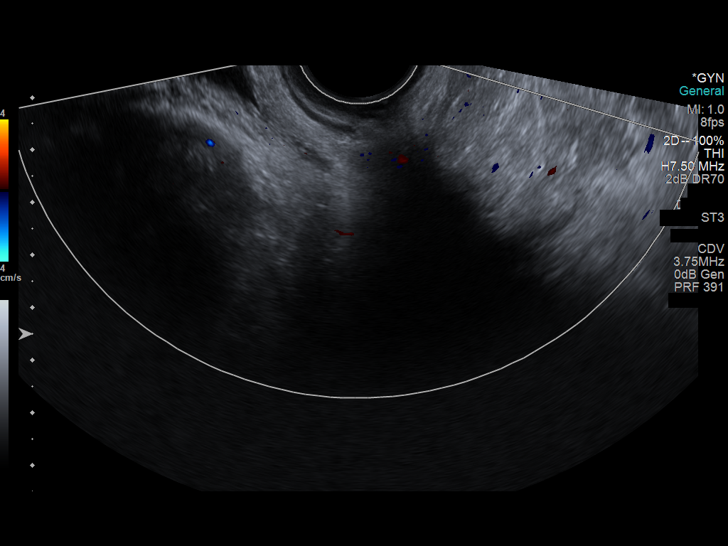

[14 of 25 positions shown; findings below may reference images not displayed]

FINDINGS: Uterus

Measurements: 14.2 x 7.5 x 6.4 cm. Multiple uterine masses
consistent fibroids. Largest arises from the posterior right uterine
fundus measuring 3.5 x 2.9 x 2.9 cm. Echogenic material within this
is consistent with calcifications, which were present on the prior
CT. There are at least 3 additional smaller fibroids.

Endometrium

Not visualized.

Right ovary

Not visualized.  No right adnexal mass.

Left ovary

Not visualized.  No left adnexal mass.

Other findings

No free fluid.
IMPRESSION: 1. Enlarged uterus with multiple fibroids.
2. Endometrium not visualized. Neither ovary visualized. No adnexal
masses.

## 2017-01-10 MED FILL — ISOSORBIDE MN ER 30 MG TAB: 30 | 30 days supply | Qty: 15 | Fill #1

## 2017-01-10 MED FILL — metFORMIN HCL 500 MG TABS: 500 | 30 days supply | Qty: 60 | Fill #1

## 2017-01-10 MED FILL — ESTRADIOL 1 MG TABLET: 1 | 30 days supply | Qty: 30 | Fill #1

## 2017-01-10 MED FILL — LISINOPRIL 2.5 MG TABLET: 2.5 | 30 days supply | Qty: 30 | Fill #0

## 2017-01-12 MED FILL — ATORVASTATIN 80 MG TABLET: 80 | 30 days supply | Qty: 30 | Fill #0

## 2017-01-12 MED FILL — METOPROLOL TARTRATE 25 MG T: 25 | 30 days supply | Qty: 60 | Fill #0

## 2017-02-13 ENCOUNTER — Other Ambulatory Visit: Payer: Self-pay | Admitting: Internal Medicine

## 2017-02-13 DIAGNOSIS — E119 Type 2 diabetes mellitus without complications: Secondary | ICD-10-CM

## 2017-02-13 MED FILL — ESTRADIOL 1 MG TABLET: 1 | 30 days supply | Qty: 30 | Fill #2

## 2017-02-13 MED FILL — ATORVASTATIN 80 MG TABLET: 80 | 30 days supply | Qty: 30 | Fill #1

## 2017-02-13 MED FILL — LISINOPRIL 2.5 MG TABLET: 2.5 | 30 days supply | Qty: 30 | Fill #1

## 2017-02-13 MED FILL — FERROUS SULFATE 325 MG TAB: 325 (65 FE) | 30 days supply | Qty: 30 | Fill #0

## 2017-02-13 MED FILL — metFORMIN HCL 500 MG TABS: 500 | 30 days supply | Qty: 60 | Fill #2

## 2017-03-07 ENCOUNTER — Emergency Department (HOSPITAL_COMMUNITY)
Admission: EM | Admit: 2017-03-07 | Discharge: 2017-03-07 | Discharge status: Against Medical Advice | Payer: Medicaid Other | Attending: Emergency Medicine | Admitting: Emergency Medicine

## 2017-03-07 ENCOUNTER — Emergency Department (HOSPITAL_COMMUNITY): Payer: Medicaid Other

## 2017-03-07 DIAGNOSIS — Z7984 Long term (current) use of oral hypoglycemic drugs: Secondary | ICD-10-CM | POA: Diagnosis not present

## 2017-03-07 DIAGNOSIS — I252 Old myocardial infarction: Secondary | ICD-10-CM | POA: Diagnosis not present

## 2017-03-07 DIAGNOSIS — R079 Chest pain, unspecified: Secondary | ICD-10-CM | POA: Diagnosis present

## 2017-03-07 DIAGNOSIS — E119 Type 2 diabetes mellitus without complications: Secondary | ICD-10-CM | POA: Insufficient documentation

## 2017-03-07 DIAGNOSIS — Z79899 Other long term (current) drug therapy: Secondary | ICD-10-CM | POA: Diagnosis not present

## 2017-03-07 DIAGNOSIS — I2510 Atherosclerotic heart disease of native coronary artery without angina pectoris: Secondary | ICD-10-CM | POA: Insufficient documentation

## 2017-03-07 DIAGNOSIS — Z7982 Long term (current) use of aspirin: Secondary | ICD-10-CM | POA: Insufficient documentation

## 2017-03-07 DIAGNOSIS — R0789 Other chest pain: Secondary | ICD-10-CM | POA: Diagnosis not present

## 2017-03-07 DIAGNOSIS — I509 Heart failure, unspecified: Secondary | ICD-10-CM | POA: Insufficient documentation

## 2017-03-07 DIAGNOSIS — I110 Hypertensive heart disease with heart failure: Secondary | ICD-10-CM | POA: Insufficient documentation

## 2017-03-07 LAB — CBC
HCT: 38.7 % (ref 36.0–46.0)
Hemoglobin: 12.6 g/dL (ref 12.0–15.0)
MCH: 30.7 pg (ref 26.0–34.0)
MCHC: 32.6 g/dL (ref 30.0–36.0)
MCV: 94.4 fL (ref 78.0–100.0)
PLATELETS: 165 10*3/uL (ref 150–400)
RBC: 4.1 MIL/uL (ref 3.87–5.11)
RDW: 13.6 % (ref 11.5–15.5)
WBC: 6.2 10*3/uL (ref 4.0–10.5)

## 2017-03-07 LAB — BASIC METABOLIC PANEL
Anion gap: 6 (ref 5–15)
BUN: 8 mg/dL (ref 6–20)
CHLORIDE: 104 mmol/L (ref 101–111)
CO2: 25 mmol/L (ref 22–32)
Calcium: 8.8 mg/dL — ABNORMAL LOW (ref 8.9–10.3)
Creatinine, Ser: 0.96 mg/dL (ref 0.44–1.00)
GFR calc Af Amer: >60 mL/min (ref >60)
GFR calc non Af Amer: >60 mL/min (ref >60)
GLUCOSE: 136 mg/dL — AB (ref 65–99)
Potassium: 3.7 mmol/L (ref 3.5–5.1)
Sodium: 135 mmol/L (ref 135–145)

## 2017-03-07 LAB — I-STAT TROPONIN, ED: Troponin i, poc: 0 ng/mL (ref 0.00–0.08)

## 2017-03-07 NOTE — ED Triage Notes (Addendum)
Patient presents with c/o CP x 90 minutes. Right side of chest radiating to right scapula. MI in 8/16 that had similar presentation.

## 2017-03-07 NOTE — ED Notes (Signed)
Patient transported to X-ray 

## 2017-03-07 NOTE — ED Notes (Signed)
All documentation from Arrival to 2052, noted to be by Ashok Norris, was actually done by Oleh Genin.

## 2017-03-07 NOTE — ED Notes (Signed)
ED Provider at bedside. 

## 2017-03-07 NOTE — ED Provider Notes (Signed)
[facility] DEPT Provider Note   CSN: [id number] Arrival date & time: 03/07/17  2029     History   Chief Complaint Chief Complaint  Patient presents with  . Chest Pain    started abiut 90 minutes ago, drove self to ED.    HPI Virginia [name] is a 56 y.o. female.  HPI  Past Medical History:  Diagnosis Date  . Anemia   . CAD (coronary artery disease)    a. 06/2015 BMS x2 to LCx. b. recurrent CP, cath 09/18/2015 patent stent, tiny diag stenosis medical therapy.   . CHF (congestive heart failure) ([place])   . Diabetes mellitus without complication ([place])   . GERD (gastroesophageal reflux disease)    ?  Marland Kitchen Hyperlipidemia with target LDL less than 70   . Hypertension   . Shortness of breath dyspnea    with exertion  . STEMI (ST elevation myocardial infarction) ([place]) 06/16/2015   BMS x 2 CFX  . Thyroid nodule    benign    Patient Active Problem List   Diagnosis Date Noted  . Blood pressure check 12/28/2016  . Bradycardia 12/14/2016  . Controlled type 2 diabetes mellitus without complication, without long-term current use of insulin ([place]) 03/29/2016  . Numbness of anterior thigh 03/29/2016  . Menopausal hot flushes 03/10/2016  . Essential hypertension 12/29/2015  . Fibroid uterus 11/13/2015  . Transient right leg weakness 10/05/2015  . Acute GI bleeding   . Symptomatic anemia 09/19/2015  . Vasovagal near syncope 09/19/2015  . Dysfunctional uterine bleeding 09/19/2015  . Diarrhea due to drug 09/19/2015  . Heme positive stool 09/19/2015  . Chest pain 09/17/2015  . Multinodular goiter 07/09/2015  . CAD S/P CFX BMS (? med complinace) x 2 06/16/15 07/01/2015  . Acute renal insufficiency 07/01/2015  . Abdominal pain 07/01/2015  . Nodular goiter 06/23/2015  . Diabetes mellitus type 2, controlled ([place]) 06/23/2015  . STEMI -06/16/15 06/17/2015  . Hyperlipidemia with target LDL less than 70     Past Surgical History:  Procedure Laterality Date  . ABDOMINAL HYSTERECTOMY N/A  02/02/2016   Procedure: HYSTERECTOMY ABDOMINAL WITH BILATERAL SALPINGECTOMY;  Surgeon: Woodroe Mode, MD;  Location: [facility];  Service: Gynecology;  Laterality: N/A;  . APPENDECTOMY    . CARDIAC CATHETERIZATION N/A 06/17/2015   Procedure: Left Heart Cath and Coronary Angiography;  Surgeon: Jettie Booze, MD;  Location: [facility];  Service: Cardiovascular;  Laterality: N/A;  . CARDIAC CATHETERIZATION  06/17/2015   Procedure: Coronary Stent Intervention;  Surgeon: Jettie Booze, MD;  Location: [facility];  Service: Cardiovascular;;  . CARDIAC CATHETERIZATION N/A 09/18/2015   Procedure: Left Heart Cath and Coronary Angiography;  Surgeon: Troy Sine, MD;  Location: [facility];  Service: Cardiovascular;  Laterality: N/A;  . CESAREAN SECTION     Myomectomy done during cesarean section  . COLONOSCOPY N/A 09/20/2015   Procedure: COLONOSCOPY;  Surgeon: Carol Ada, MD;  Location: [facility];  Service: Endoscopy;  Laterality: N/A;  . COLONOSCOPY    . ESOPHAGOGASTRODUODENOSCOPY N/A 09/20/2015   Procedure: ESOPHAGOGASTRODUODENOSCOPY (EGD);  Surgeon: Carol Ada, MD;  Location: [facility];  Service: Endoscopy;  Laterality: N/A;  . olympus  quick clip pro gastric body  10/09/15    OB History    Gravida Para Term Preterm AB Living   '2 2 2     2   ' SAB TAB Ectopic Multiple Live Births  Home Medications    Prior to Admission medications   Medication Sig Start Date End Date Taking? Authorizing Provider  aspirin EC 81 MG tablet Take 81 mg by mouth daily.    Historical Provider, MD  atorvastatin (LIPITOR) 80 MG tablet TAKE 1 TABLET BY MOUTH DAILY AT 6 PM 12/14/16   Dorena Dew, FNP  Blood Glucose Monitoring Suppl (TRUE METRIX METER) w/Device KIT Check BS once daily 12/29/15   Tresa Garter, MD  estradiol (ESTRACE) 1 MG tablet Take 1 tablet (1 mg total) by mouth daily. 12/14/16   Dorena Dew, FNP  ferrous sulfate 325 (65 FE) MG  tablet TAKE 1 TABLET BY MOUTH DAILY WITH BREAKFAST. 02/13/17   Tresa Garter, MD  gabapentin (NEURONTIN) 300 MG capsule Take 1 capsule (300 mg total) by mouth at bedtime. 12/14/16   Dorena Dew, FNP  glucose blood (TRUE METRIX BLOOD GLUCOSE TEST) test strip Use as instructed 12/29/15   Tresa Garter, MD  isosorbide mononitrate (IMDUR) 30 MG 24 hr tablet TAKE 1/2 TABLET BY MOUTH DAILY. Patient not taking: Reported on 12/14/2016 12/06/16   Jerline Pain, MD  lisinopril (PRINIVIL,ZESTRIL) 2.5 MG tablet Take 1 tablet (2.5 mg total) by mouth daily. 12/14/16   Dorena Dew, FNP  metFORMIN (GLUCOPHAGE) 500 MG tablet Take 1 tablet (500 mg total) by mouth 2 (two) times daily with a meal. 12/14/16   Dorena Dew, FNP  metoprolol tartrate (LOPRESSOR) 25 MG tablet Take 0.5 tablets (12.5 mg total) by mouth 2 (two) times daily. 12/14/16   Dorena Dew, FNP  nitroGLYCERIN (NITROSTAT) 0.4 MG SL tablet Place 1 tablet (0.4 mg total) under the tongue every 5 (five) minutes as needed for chest pain. 06/19/15   Brittainy Erie Noe, PA-C  pantoprazole (PROTONIX) 40 MG tablet Take 1 tablet (40 mg total) by mouth daily. Patient not taking: Reported on 12/14/2016 09/22/15   Thurnell Lose, MD  TRUEPLUS LANCETS 28G MISC Check BS once daily 12/29/15   Tresa Garter, MD  vitamin B-12 (CYANOCOBALAMIN) 1000 MCG tablet Take 1 tablet (1,000 mcg total) by mouth daily. 03/29/16   Tresa Garter, MD    Family History Family History  Problem Relation Age of Onset  . Stroke Mother   . Heart disease Mother   . Diabetes Mother   . Cancer Father   . Hypertension Sister   . Hyperlipidemia Sister   . Heart attack Brother   . Hypertension Brother   . Heart attack Maternal Uncle   . Asthma Paternal Uncle   . Diabetes Paternal Uncle   . Diabetes Paternal Aunt   . Thyroid disease Neg Hx     Social History Social History  Substance Use Topics  . Smoking status: Never Smoker  . Smokeless tobacco: Never  Used  . Alcohol use No     Allergies   Patient has no known allergies.   Review of Systems Review of Systems   Physical Exam Updated Vital Signs BP (!) 162/93 (BP Location: Right Arm)   Pulse 83   Temp 98.3 F (36.8 C) (Oral)   Resp (!) 22   Ht '5\' 6"'  (1.676 m)   Wt 79.4 kg   LMP 12/29/2015   SpO2 98%   BMI 28.25 kg/m   Physical Exam   ED Treatments / Results  Labs (all labs ordered are listed, but only abnormal results are displayed) Labs Reviewed  [place], ED  EKG  EKG Interpretation  Date/Time:  Tuesday Mar 07 2017 20:35:01 EDT Ventricular Rate:  77 PR Interval:  154 QRS Duration: 74 QT Interval:  378 QTC Calculation: 427 R Axis:   78 Text Interpretation:  Normal sinus rhythm Normal ECG No significant change since last tracing Confirmed by ISAACS MD, Lysbeth Galas [phone]) on 03/07/2017 9:25:48 PM       Radiology Dg Chest 2 View  Result Date: 03/07/2017 CLINICAL DATA:  56 y/o  F; chest pain. EXAM: CHEST  2 VIEW COMPARISON:  09/17/2015 chest radiograph. FINDINGS: Stable heart size and mediastinal contours are within normal limits. Both lungs are clear. Mild degenerative changes of thoracic spine. IMPRESSION: No active cardiopulmonary disease. Electronically Signed   By: Kristine Garbe M.D.   On: 03/07/2017 21:31    Procedures Procedures (including critical care time)  Medications Ordered in ED Medications - No data to display   Initial Impression / Assessment and Plan / ED Course  I have reviewed the triage vital signs and the nursing notes.  Pertinent labs & imaging results that were available during my care of the patient were reviewed by me and considered in my medical decision making (see chart for details).    Pt with h/o CAD (s/p stent placement in 2016) presents with CP. Says the symptoms started at rest 3-4hrs ago; she cannot describe the sensation very well, but says the pain is localized to the  right upper parasternal area w/radiation to underneath the right scapula & left jaw, is not made better or worse by anything, and was associated with some SOB & nausea that have since resolved; she took ASA at home and came to the ED b/c some of her symptoms reminded her of when she had the MI 41yr ago. She says she was in her normal state of health prior to today's episode.  VS & exam as above. EKG: NSR @ 77bpm w/o signs of ischemia. CXR WNL. Labs unremarkable, including undetectable initial troponin.  Discussed all results to the Pt. Explained that since it's been 263yrsince her last cardiac w/u, that admission for additional testing in a Pt w/her history was a reasonable course of action, despite a negative ED workup. The Pt's husband was recently discharged from the hospital after brain surgery, and she says that though it is causing more stress in her life, she needs to be at home with him.  Will discharge the Pt home. Recommending follow-up with PCP & Cardiology. ED return precautions provided. Pt acknowledged understanding of, and concurrence with the plan. All questions answered to her satisfaction. In stable condition at the time of discharge.  Final Clinical Impressions(s) / ED Diagnoses   Final diagnoses:  Chest pain, unspecified type    New Prescriptions New Prescriptions   No medications on file     NiJenny ReichmannMD 03/07/17 [id number]    CaDuffy BruceMD 03/09/17 1133

## 2017-03-14 ENCOUNTER — Ambulatory Visit (INDEPENDENT_AMBULATORY_CARE_PROVIDER_SITE_OTHER): Payer: Medicaid Other | Admitting: Family Medicine

## 2017-03-14 ENCOUNTER — Encounter: Payer: Self-pay | Admitting: Family Medicine

## 2017-03-14 VITALS — BP 131/80 | HR 77 | Temp 98.2°F | Resp 14 | Ht 66.0 in | Wt 185.0 lb

## 2017-03-14 DIAGNOSIS — R600 Localized edema: Secondary | ICD-10-CM | POA: Diagnosis not present

## 2017-03-14 DIAGNOSIS — I10 Essential (primary) hypertension: Secondary | ICD-10-CM | POA: Diagnosis not present

## 2017-03-14 DIAGNOSIS — K219 Gastro-esophageal reflux disease without esophagitis: Secondary | ICD-10-CM

## 2017-03-14 DIAGNOSIS — E119 Type 2 diabetes mellitus without complications: Secondary | ICD-10-CM | POA: Diagnosis not present

## 2017-03-14 LAB — BASIC METABOLIC PANEL
BUN: 9 mg/dL (ref 7–25)
CHLORIDE: 109 mmol/L (ref 98–110)
CO2: 23 mmol/L (ref 20–31)
Calcium: 8.4 mg/dL — ABNORMAL LOW (ref 8.6–10.4)
Creat: 0.83 mg/dL (ref 0.50–1.05)
Glucose, Bld: 89 mg/dL (ref 65–99)
Potassium: 4.2 mmol/L (ref 3.5–5.3)
Sodium: 141 mmol/L (ref 135–146)

## 2017-03-14 LAB — POCT URINALYSIS DIP (DEVICE)
Bilirubin Urine: NEGATIVE
GLUCOSE, UA: NEGATIVE mg/dL
Ketones, ur: NEGATIVE mg/dL
Leukocytes, UA: NEGATIVE
Nitrite: NEGATIVE
PROTEIN: NEGATIVE mg/dL
Specific Gravity, Urine: 1.02 (ref 1.005–1.030)
UROBILINOGEN UA: 0.2 mg/dL (ref 0.0–1.0)
pH: 7 (ref 5.0–8.0)

## 2017-03-14 LAB — POCT GLYCOSYLATED HEMOGLOBIN (HGB A1C): HEMOGLOBIN A1C: 5.8

## 2017-03-14 MED ORDER — ISOSORBIDE MONONITRATE ER 30 MG PO TB24: 15.0000 mg | ORAL_TABLET | Freq: Every day | ORAL | 5 refills | Status: DC

## 2017-03-14 MED ORDER — FERROUS SULFATE 325 (65 FE) MG PO TABS: 325.0000 mg | ORAL_TABLET | Freq: Every day | ORAL | 5 refills | Status: DC

## 2017-03-14 MED ORDER — OMEPRAZOLE 20 MG PO CPDR: 20.0000 mg | DELAYED_RELEASE_CAPSULE | Freq: Every day | ORAL | 5 refills | Status: DC

## 2017-03-14 MED ORDER — MEDICAL COMPRESSION STOCKINGS MISC: 1.0000 | Freq: Every day | 0 refills | Status: DC

## 2017-03-14 MED FILL — ESTRADIOL 1 MG TABLET: 1 | 30 days supply | Qty: 30 | Fill #3

## 2017-03-14 MED FILL — ?ISOSORBIDE MN ER 30 MG TAB: 30 | 30 days supply | Qty: 15 | Fill #0

## 2017-03-14 MED FILL — ATORVASTATIN 80 MG TABLET: 80 | 30 days supply | Qty: 30 | Fill #2

## 2017-03-14 MED FILL — FERROUS SULFATE 325 MG TAB: 325 (65 FE) | 30 days supply | Qty: 30 | Fill #0

## 2017-03-14 MED FILL — LISINOPRIL 2.5 MG TABLET: 2.5 | 30 days supply | Qty: 30 | Fill #2

## 2017-03-14 MED FILL — metFORMIN HCL 500 MG TABS: 500 | 30 days supply | Qty: 60 | Fill #3

## 2017-03-14 MED FILL — OMEPRAZOLE DR 20 MG CAPSULE: 20 | 30 days supply | Qty: 30 | Fill #0

## 2017-03-14 NOTE — Progress Notes (Signed)
Subjective:    Patient ID: Virginia [name], female    DOB: Dec 04, 1960, 56 y.o.   MRN: [id number]  [name], a 56 year female with a history of hypertension, diabetes, and hyperlipidemia presents for a follow up of chronic conditions.    Diabetes  She presents for her follow-up diabetic visit. She has type 2 diabetes mellitus. Her disease course has been stable. There are no hypoglycemic associated symptoms. Pertinent negatives for hypoglycemia include no headaches or sweats. Associated symptoms include foot paresthesias (right great toe and left upper extremity). Pertinent negatives for diabetes include no blurred vision, no fatigue, no polydipsia, no polyphagia, no polyuria, no visual change, no weakness and no weight loss. Pertinent negatives for hypoglycemia complications include no blackouts. Symptoms are improving. Pertinent negatives for diabetic complications include no CVA, heart disease, peripheral neuropathy or retinopathy. Risk factors for coronary artery disease include dyslipidemia, sedentary lifestyle and post-menopausal. Current diabetic treatment includes oral agent (monotherapy). She is compliant with treatment all of the time. Her weight is stable. She is following a diabetic diet. She participates in exercise intermittently. An ACE inhibitor/angiotensin II receptor blocker is being taken. She does not see a podiatrist.Eye exam is current.  Hypertension  This is a chronic problem. The current episode started more than 1 year ago. The problem is controlled. Pertinent negatives include no blurred vision, headaches, neck pain, orthopnea, palpitations, peripheral edema, PND or sweats. There are no associated agents to hypertension. Risk factors for coronary artery disease include dyslipidemia and post-menopausal state. Past treatments include ACE inhibitors and beta blockers. Compliance problems include diet.  There is no history of kidney disease, CAD/MI, CVA, heart failure, left  ventricular hypertrophy or retinopathy. There is no history of chronic renal disease, coarctation of the aorta, hyperaldosteronism, hypercortisolism, hyperparathyroidism, sleep apnea or a thyroid problem.     Past Medical History:  Diagnosis Date  . Anemia   . CAD (coronary artery disease)    a. 06/2015 BMS x2 to LCx. b. recurrent CP, cath 09/18/2015 patent stent, tiny diag stenosis medical therapy.   . CHF (congestive heart failure) ([place])   . Diabetes mellitus without complication ([place])   . GERD (gastroesophageal reflux disease)    ?  Marland Kitchen Hyperlipidemia with target LDL less than 70   . Hypertension   . Shortness of breath dyspnea    with exertion  . STEMI (ST elevation myocardial infarction) ([place]) 06/16/2015   BMS x 2 CFX  . Thyroid nodule    benign   Immunization History  Administered Date(s) Administered  . Influenza,inj,Quad PF,36+ Mos 09/18/2015, 12/14/2016  . Pneumococcal Polysaccharide-23 09/18/2015  . Tdap 03/21/2012    Diabetic Foot Exam - Simple   No data filed    Body mass index is 29.86 kg/m. Review of Systems  Constitutional: Negative for fatigue, unexpected weight change and weight loss.  HENT: Negative.   Eyes: Negative.  Negative for blurred vision.  Respiratory: Negative for cough, chest tightness and wheezing.   Cardiovascular: Negative for palpitations, orthopnea, leg swelling and PND.  Gastrointestinal: Negative.        Heartburn  Endocrine: Negative.  Negative for polydipsia, polyphagia and polyuria.  Musculoskeletal: Negative for neck pain.  Skin: Negative.   Allergic/Immunologic: Negative for immunocompromised state.  Neurological: Negative.  Negative for weakness and headaches.  Hematological: Negative.   Psychiatric/Behavioral: Negative.         Objective:   Physical Exam  Constitutional: She is oriented to person, place, and time.  HENT:  Head: Normocephalic and atraumatic.  Right Ear: External ear normal.  Left Ear: External ear  normal.  Nose: Nose normal.  Mouth/Throat: Oropharynx is clear and moist.  Eyes: Conjunctivae and EOM are normal. Pupils are equal, round, and reactive to light. No scleral icterus.  Neck: Normal range of motion. Neck supple.  Cardiovascular: Normal rate, normal heart sounds and intact distal pulses.   Pulmonary/Chest: Effort normal and breath sounds normal.  Abdominal: Soft. Bowel sounds are normal.  Genitourinary: Vagina normal and uterus normal.  Neurological: She is alert and oriented to person, place, and time. She has normal reflexes.  Monofilament test negative  Skin: Skin is warm and dry.  Psychiatric: She has a normal mood and affect. Her behavior is normal. Judgment and thought content normal.  Vitals reviewed.    BP 131/80 (BP Location: Left Arm, Patient Position: Sitting, Cuff Size: Normal)   Pulse 77   Temp 98.2 F (36.8 C) (Oral)   Resp 14   Ht 5\' 6"  (1.676 m)   Wt 185 lb (83.9 kg)   LMP 12/29/2015   SpO2 100%   BMI 29.86 kg/m  Assessment & Plan:  1. Controlled type 2 diabetes mellitus without complication, without long-term current use of insulin (H[facility]) - HgB A1c - ferrous sulfate 325 (65 FE) MG tablet; Take 1 tablet (325 mg total) by mouth daily with breakfast.  Dispense: 30 tablet; Refill: 5 - Hepatitis C Antibody  2. Essential hypertension Blood pressure is at goal on current medication regimen.  - isosorbide mononitrate (IMDUR) 30 MG 24 hr tablet; Take 0.5 tablets (15 mg total) by mouth daily.  Dispense: 30 tablet; Refill: 5 - Basic Metabolic Panel  3. Bilateral lower extremity edema - Elastic Bandages & Supports (MEDICAL COMPRESSION STOCKINGS) MISC; 1 each by Does not apply route daily.  Dispense: 1 each; Refill: 0  4. Gastroesophageal reflux disease without esophagitis - omeprazole (PRILOSEC) 20 MG capsule; Take 1 capsule (20 mg total) by mouth daily.  Dispense: 30 capsule; Refill: 5   RTC: 3 months for chronic conditions   Navaya Wiatrek Al Decant  MSN,  FNP-C [facility] [address] [place], [place] [address] [phone]

## 2017-03-14 NOTE — Patient Instructions (Addendum)
Diabetes Mellitus and Exercise Exercising regularly is important for your overall health, especially when you have diabetes (diabetes mellitus). Exercising is not only about losing weight. It has many health benefits, such as increasing muscle strength and bone density and reducing body fat and stress. This leads to improved fitness, flexibility, and endurance, all of which result in better overall health. Exercise has additional benefits for people with diabetes, including:  Reducing appetite.  Helping to lower and control blood glucose.  Lowering blood pressure.  Helping to control amounts of fatty substances (lipids) in the blood, such as cholesterol and triglycerides.  Helping the body to respond better to insulin (improving insulin sensitivity).  Reducing how much insulin the body needs.  Decreasing the risk for heart disease by:  Lowering cholesterol and triglyceride levels.  Increasing the levels of good cholesterol.  Lowering blood glucose levels. What is my activity plan? Your health care provider or certified diabetes educator can help you make a plan for the type and frequency of exercise (activity plan) that works for you. Make sure that you:  Do at least 150 minutes of moderate-intensity or vigorous-intensity exercise each week. This could be brisk walking, biking, or water aerobics.  Do stretching and strength exercises, such as yoga or weightlifting, at least 2 times a week.  Spread out your activity over at least 3 days of the week.  Get some form of physical activity every day.  Do not go more than 2 days in a row without some kind of physical activity.  Avoid being inactive for more than 90 minutes at a time. Take frequent breaks to walk or stretch.  Choose a type of exercise or activity that you enjoy, and set realistic goals.  Start slowly, and gradually increase the intensity of your exercise over time. What do I need to know about managing my  diabetes?  Check your blood glucose before and after exercising.  If your blood glucose is higher than 240 mg/dL (13.3 mmol/L) before you exercise, check your urine for ketones. If you have ketones in your urine, do not exercise until your blood glucose returns to normal.  Know the symptoms of low blood glucose (hypoglycemia) and how to treat it. Your risk for hypoglycemia increases during and after exercise. Common symptoms of hypoglycemia can include:  Hunger.  Anxiety.  Sweating and feeling clammy.  Confusion.  Dizziness or feeling light-headed.  Increased heart rate or palpitations.  Blurry vision.  Tingling or numbness around the mouth, lips, or tongue.  Tremors or shakes.  Irritability.  Keep a rapid-acting carbohydrate snack available before, during, and after exercise to help prevent or treat hypoglycemia.  Avoid injecting insulin into areas of the body that are going to be exercised. For example, avoid injecting insulin into:  The arms, when playing tennis.  The legs, when jogging.  Keep records of your exercise habits. Doing this can help you and your health care provider adjust your diabetes management plan as needed. Write down:  Food that you eat before and after you exercise.  Blood glucose levels before and after you exercise.  The type and amount of exercise you have done.  When your insulin is expected to peak, if you use insulin. Avoid exercising at times when your insulin is peaking.  When you start a new exercise or activity, work with your health care provider to make sure the activity is safe for you, and to adjust your insulin, medicines, or food intake as needed.  Drink plenty   of water while you exercise to prevent dehydration or heat stroke. Drink enough fluid to keep your urine clear or pale yellow. This information is not intended to replace advice given to you by your health care provider. Make sure you discuss any questions you have with  your health care provider. Document Released: 01/14/2004 Document Revised: 05/13/2016 Document Reviewed: 04/04/2016 Elsevier Interactive Patient Education  2017 [employer] for Gastroesophageal Reflux Disease, Adult When you have gastroesophageal reflux disease (GERD), the foods you eat and your eating habits are very important. Choosing the right foods can help ease your discomfort. What guidelines do I need to follow?  Choose fruits, vegetables, whole grains, and low-fat dairy products.  Choose low-fat meat, fish, and poultry.  Limit fats such as oils, salad dressings, butter, nuts, and avocado.  Keep a food diary. This helps you identify foods that cause symptoms.  Avoid foods that cause symptoms. These may be different for everyone.  Eat small meals often instead of 3 large meals a day.  Eat your meals slowly, in a place where you are relaxed.  Limit fried foods.  Cook foods using methods other than frying.  Avoid drinking alcohol.  Avoid drinking large amounts of liquids with your meals.  Avoid bending over or lying down until 2-3 hours after eating. What foods are not recommended? These are some foods and drinks that may make your symptoms worse: Vegetables  Tomatoes. Tomato juice. Tomato and spaghetti sauce. Chili peppers. Onion and garlic. Horseradish. Fruits  Oranges, grapefruit, and lemon (fruit and juice). Meats  High-fat meats, fish, and poultry. This includes hot dogs, ribs, ham, sausage, salami, and bacon. Dairy  Whole milk and chocolate milk. Sour cream. Cream. Butter. Ice cream. Cream cheese. Drinks  Coffee and tea. Bubbly (carbonated) drinks or energy drinks. Condiments  Hot sauce. Barbecue sauce. Sweets/Desserts  Chocolate and cocoa. Donuts. Peppermint and spearmint. Fats and Oils  High-fat foods. This includes [place] fries and potato chips. Other  Vinegar. Strong spices. This includes black pepper, white pepper, red pepper,  cayenne, curry powder, cloves, ginger, and chili powder. The items listed above may not be a complete list of foods and drinks to avoid. Contact your dietitian for more information.  This information is not intended to replace advice given to you by your health care provider. Make sure you discuss any questions you have with your health care provider. Document Released: 04/24/2012 Document Revised: 03/31/2016 Document Reviewed: 08/28/2013 Elsevier Interactive Patient Education  2017 [employer].

## 2017-03-15 LAB — HEPATITIS C ANTIBODY: HCV Ab: NEGATIVE

## 2017-03-16 DIAGNOSIS — K219 Gastro-esophageal reflux disease without esophagitis: Secondary | ICD-10-CM | POA: Insufficient documentation

## 2017-03-17 MED FILL — METOPROLOL TARTRATE 25 MG T: 25 | 30 days supply | Qty: 30 | Fill #1

## 2017-03-29 LAB — GLUCOSE, POCT (MANUAL RESULT ENTRY): POC GLUCOSE: 112 mg/dL — AB (ref 70–99)

## 2017-04-12 MED FILL — ?OMEPRAZOLE DR 20 MG CAPSUL: 20 | 30 days supply | Qty: 30 | Fill #1

## 2017-04-12 MED FILL — FERROUS SULFATE 325 MG TAB: 325 (65 FE) | 30 days supply | Qty: 30 | Fill #1

## 2017-04-12 MED FILL — ?ISOSORBIDE MN ER 30 MG TAB: 30 | 30 days supply | Qty: 15 | Fill #1

## 2017-04-17 MED FILL — METOPROLOL TARTRATE 25 MG T: 25 | 30 days supply | Qty: 30 | Fill #2

## 2017-04-17 MED FILL — ESTRADIOL 1 MG TABLET: 1 | 30 days supply | Qty: 30 | Fill #4

## 2017-04-17 MED FILL — ATORVASTATIN 80 MG TABLET: 80 | 30 days supply | Qty: 30 | Fill #3

## 2017-04-17 MED FILL — LISINOPRIL 2.5 MG TABLET: 2.5 | 30 days supply | Qty: 30 | Fill #3

## 2017-04-17 MED FILL — metFORMIN HCL 500 MG TABS: 500 | 30 days supply | Qty: 60 | Fill #4

## 2017-05-11 MED FILL — ATORVASTATIN 80 MG TABLET: 80 | 30 days supply | Qty: 30 | Fill #4

## 2017-05-11 MED FILL — FERROUS SULFATE 325 MG TAB: 325 (65 FE) | 30 days supply | Qty: 30 | Fill #2

## 2017-05-11 MED FILL — OMEPRAZOLE DR 20 MG CAPSULE: 20 | 30 days supply | Qty: 30 | Fill #2

## 2017-05-11 MED FILL — metFORMIN HCL 500 MG TABS: 500 | 30 days supply | Qty: 60 | Fill #5

## 2017-05-11 MED FILL — METOPROLOL TARTRATE 25 MG T: 25 | 30 days supply | Qty: 30 | Fill #3

## 2017-05-11 MED FILL — ESTRADIOL 1 MG TABLET: 1 | 30 days supply | Qty: 30 | Fill #5

## 2017-05-11 MED FILL — LISINOPRIL 2.5 MG TABLET: 2.5 | 30 days supply | Qty: 30 | Fill #4

## 2017-05-11 MED FILL — ISOSORBIDE MN ER 30 MG TAB: 30 | 30 days supply | Qty: 15 | Fill #2

## 2017-06-14 ENCOUNTER — Ambulatory Visit (INDEPENDENT_AMBULATORY_CARE_PROVIDER_SITE_OTHER): Payer: Medicaid Other | Admitting: Family Medicine

## 2017-06-14 ENCOUNTER — Encounter: Payer: Self-pay | Admitting: Family Medicine

## 2017-06-14 VITALS — BP 116/69 | HR 77 | Temp 98.2°F | Resp 16 | Ht 66.0 in | Wt 178.0 lb

## 2017-06-14 DIAGNOSIS — I10 Essential (primary) hypertension: Secondary | ICD-10-CM

## 2017-06-14 DIAGNOSIS — E785 Hyperlipidemia, unspecified: Secondary | ICD-10-CM

## 2017-06-14 DIAGNOSIS — E119 Type 2 diabetes mellitus without complications: Secondary | ICD-10-CM

## 2017-06-14 LAB — POCT URINALYSIS DIP (DEVICE)
BILIRUBIN URINE: NEGATIVE
Glucose, UA: NEGATIVE mg/dL
Ketones, ur: NEGATIVE mg/dL
Leukocytes, UA: NEGATIVE
Nitrite: NEGATIVE
Protein, ur: NEGATIVE mg/dL
SPECIFIC GRAVITY, URINE: 1.02 (ref 1.005–1.030)
Urobilinogen, UA: 0.2 mg/dL (ref 0.0–1.0)
pH: 5.5 (ref 5.0–8.0)

## 2017-06-14 LAB — POCT GLYCOSYLATED HEMOGLOBIN (HGB A1C): Hemoglobin A1C: 5.9

## 2017-06-14 MED ORDER — NITROGLYCERIN 0.4 MG SL SUBL: 0.4000 mg | SUBLINGUAL_TABLET | SUBLINGUAL | 2 refills | Status: DC | PRN

## 2017-06-14 MED ORDER — METFORMIN HCL 500 MG PO TABS: 500.0000 mg | ORAL_TABLET | Freq: Every day | ORAL | 3 refills | Status: DC

## 2017-06-14 MED ORDER — ATORVASTATIN CALCIUM 40 MG PO TABS: ORAL_TABLET | ORAL | 1 refills | Status: DC

## 2017-06-14 MED FILL — metFORMIN HCL 500 MG TABS: 500 | 90 days supply | Qty: 90 | Fill #0

## 2017-06-14 MED FILL — NITROSTAT 0.4 MG TABLET SL: 0.4 | 25 days supply | Qty: 25 | Fill #0

## 2017-06-14 MED FILL — ATORVASTATIN 40 MG TABLET: 40 | 90 days supply | Qty: 90 | Fill #0

## 2017-06-14 NOTE — Progress Notes (Signed)
Subjective:    Patient ID: Virginia [name], female    DOB: 04-May-1961, 56 y.o.   MRN: [id number]  Virginia [name], a 56 year female with a history of hypertension, diabetes, and hyperlipidemia presents for a follow up of chronic conditions. Virginia [name] states that she feels well overall and is without complaint. She has been exercising and following a low fat diet. She has lost 8 pounds since previous visit.    Hypertension  This is a chronic problem. The problem is controlled. Pertinent negatives include no blurred vision, headaches, neck pain, orthopnea, palpitations, peripheral edema, PND or sweats. There are no associated agents to hypertension. Risk factors for coronary artery disease include dyslipidemia and post-menopausal state. Past treatments include ACE inhibitors and beta blockers. Compliance problems include diet.  There is no history of kidney disease, CAD/MI, CVA, heart failure, left ventricular hypertrophy or retinopathy. There is no history of chronic renal disease, coarctation of the aorta, hyperaldosteronism, hypercortisolism, hyperparathyroidism, sleep apnea or a thyroid problem.  Diabetes  She has type 2 diabetes mellitus. There are no hypoglycemic associated symptoms. Pertinent negatives for hypoglycemia include no headaches or sweats. Associated symptoms include foot paresthesias (right great toe and left upper extremity). Pertinent negatives for diabetes include no blurred vision, no fatigue, no polydipsia, no polyphagia, no polyuria, no visual change, no weakness and no weight loss. Pertinent negatives for hypoglycemia complications include no blackouts. Symptoms are improving. Pertinent negatives for diabetic complications include no CVA, heart disease, peripheral neuropathy or retinopathy. Risk factors for coronary artery disease include dyslipidemia, sedentary lifestyle and post-menopausal. Current diabetic treatment includes oral agent (monotherapy). She is compliant with  treatment all of the time. Her weight is stable. She is following a diabetic diet. She participates in exercise intermittently. An ACE inhibitor/angiotensin II receptor blocker is being taken. She does not see a podiatrist.Eye exam is current.     Past Medical History:  Diagnosis Date  . Anemia   . CAD (coronary artery disease)    a. 06/2015 BMS x2 to LCx. b. recurrent CP, cath 09/18/2015 patent stent, tiny diag stenosis medical therapy.   . CHF (congestive heart failure) ([place])   . Diabetes mellitus without complication ([place])   . GERD (gastroesophageal reflux disease)    ?  Marland Kitchen Hyperlipidemia with target LDL less than 70   . Hypertension   . Shortness of breath dyspnea    with exertion  . STEMI (ST elevation myocardial infarction) ([place]) 06/16/2015   BMS x 2 CFX  . Thyroid nodule    benign   Immunization History  Administered Date(s) Administered  . Influenza,inj,Quad PF,36+ Mos 09/18/2015, 12/14/2016  . Pneumococcal Polysaccharide-23 09/18/2015  . Tdap 03/21/2012    Diabetic Foot Exam - Simple   No data filed    Body mass index is 28.73 kg/m. Review of Systems  Constitutional: Negative for fatigue, unexpected weight change and weight loss.  HENT: Negative.   Eyes: Negative.  Negative for blurred vision.  Respiratory: Negative for cough, chest tightness and wheezing.   Cardiovascular: Negative for palpitations, orthopnea, leg swelling and PND.  Gastrointestinal: Negative.   Endocrine: Negative.  Negative for polydipsia, polyphagia and polyuria.  Musculoskeletal: Negative for neck pain.  Skin: Negative.   Allergic/Immunologic: Negative for immunocompromised state.  Neurological: Negative.  Negative for weakness and headaches.  Hematological: Negative.   Psychiatric/Behavioral: Negative.         Objective:   Physical Exam  Constitutional: She is oriented to person, place, and time.  HENT:  Head: Normocephalic and atraumatic.  Right Ear: External ear normal.  Left  Ear: External ear normal.  Nose: Nose normal.  Mouth/Throat: Oropharynx is clear and moist.  Eyes: Pupils are equal, round, and reactive to light. Conjunctivae and EOM are normal. No scleral icterus.  Neck: Normal range of motion. Neck supple.  Cardiovascular: Normal rate, normal heart sounds and intact distal pulses.   Pulmonary/Chest: Effort normal and breath sounds normal.  Abdominal: Soft. Bowel sounds are normal.  Genitourinary: Vagina normal and uterus normal.  Neurological: She is alert and oriented to person, place, and time. She has normal reflexes.  Monofilament test negative  Skin: Skin is warm and dry.  Psychiatric: She has a normal mood and affect. Her behavior is normal. Judgment and thought content normal.  Vitals reviewed.    BP 116/69 (BP Location: Left Arm, Patient Position: Sitting, Cuff Size: Normal)   Pulse 77   Temp 98.2 F (36.8 C) (Oral)   Resp 16   Ht 5\' 6"  (1.676 m)   Wt 178 lb (80.7 kg)   LMP 12/29/2015   SpO2 99%   BMI 28.73 kg/m  Assessment & Plan:  1. Controlled type 2 diabetes mellitus without complication, without long-term current use of insulin (HCC) Hemoglobin a1C has decreased to 5.9, will decrease Metformin to 500 mg daily with breakfast. Continue diet and exercise routine.  - HgB A1c - metFORMIN (GLUCOPHAGE) 500 MG tablet; Take 1 tablet (500 mg total) by mouth daily with breakfast.  Dispense: 90 tablet; Refill: 3 - BASIC METABOLIC PANEL WITH GFR Diabetic Foot Exam - Simple   Simple Foot Form Diabetic Foot exam was performed with the following findings:  Yes 06/14/2017 11:00 AM  Visual Inspection No deformities, no ulcerations, no other skin breakdown bilaterally:  Yes Sensation Testing Intact to touch and monofilament testing bilaterally:  Yes Pulse Check Posterior Tibialis and Dorsalis pulse intact bilaterally:  Yes Comments     2. Essential hypertension Blood pressure is at goal on current medication regimen. No medication changes  warranted. Reviewed urinalysis, no proteinuria present  3. Hyperlipidemia with target LDL less than 70 Reviewed lipid panel, total cholesterol is 130 and LDL is 61. Will continue atorvastatin at 40 mg every evening with dinner.  - atorvastatin (LIPITOR) 40 MG tablet; TAKE 1 TABLET BY MOUTH DAILY AT 6 PM  Dispense: 90 tablet; Refill: 1   RTC: 3 months for chronic conditions   Donia Pounds  MSN, FNP-C [facility] [address] [place], [place] [address] [phone]

## 2017-06-14 NOTE — Patient Instructions (Signed)
Reduced Lipitor from 80 to 40 mg. Will return in 3 months for fasting lipid panel.  Continue Aspirn at 81 mg daily Metformin 500 mg with breakfast   Prediabetes Eating Plan Prediabetes-also called impaired glucose tolerance or impaired fasting glucose-is a condition that causes blood sugar (blood glucose) levels to be higher than normal. Following a healthy diet can help to keep prediabetes under control. It can also help to lower the risk of type 2 diabetes and heart disease, which are increased in people who have prediabetes. Along with regular exercise, a healthy diet:  Promotes weight loss.  Helps to control blood sugar levels.  Helps to improve the way that the body uses insulin.  What do I need to know about this eating plan?  Use the glycemic index (GI) to plan your meals. The index tells you how quickly a food will raise your blood sugar. Choose low-GI foods. These foods take a longer time to raise blood sugar.  Pay close attention to the amount of carbohydrates in the food that you eat. Carbohydrates increase blood sugar levels.  Keep track of how many calories you take in. Eating the right amount of calories will help you to achieve a healthy weight. Losing about 7 percent of your starting weight can help to prevent type 2 diabetes.  You may want to follow a Mediterranean diet. This diet includes a lot of vegetables, lean meats or fish, whole grains, fruits, and healthy oils and fats. What foods can I eat? Grains Whole grains, such as whole-wheat or whole-grain breads, crackers, cereals, and pasta. Unsweetened oatmeal. Bulgur. Barley. Quinoa. Brown rice. Corn or whole-wheat flour tortillas or taco shells. Vegetables Lettuce. Spinach. Peas. Beets. Cauliflower. Cabbage. Broccoli. Carrots. Tomatoes. Squash. Eggplant. Herbs. Peppers. Onions. Cucumbers. Brussels sprouts. Fruits Berries. Bananas. Apples. Oranges. Grapes. Papaya. Mango. Pomegranate. Kiwi. Grapefruit. Cherries. Meats  and Other Protein Sources Seafood. Lean meats, such as chicken and [place] or lean cuts of pork and beef. Tofu. Eggs. Nuts. Beans. Dairy Low-fat or fat-free dairy products, such as yogurt, cottage cheese, and cheese. Beverages Water. Tea. Coffee. Sugar-free or diet soda. Seltzer water. Milk. Milk alternatives, such as soy or almond milk. Condiments Mustard. Relish. Low-fat, low-sugar ketchup. Low-fat, low-sugar barbecue sauce. Low-fat or fat-free mayonnaise. Sweets and Desserts Sugar-free or low-fat pudding. Sugar-free or low-fat ice cream and other frozen treats. Fats and Oils Avocado. Walnuts. Olive oil. The items listed above may not be a complete list of recommended foods or beverages. Contact your dietitian for more options. What foods are not recommended? Grains Refined white flour and flour products, such as bread, pasta, snack foods, and cereals. Beverages Sweetened drinks, such as sweet iced tea and soda. Sweets and Desserts Baked goods, such as cake, cupcakes, pastries, cookies, and cheesecake. The items listed above may not be a complete list of foods and beverages to avoid. Contact your dietitian for more information. This information is not intended to replace advice given to you by your health care provider. Make sure you discuss any questions you have with your health care provider. Document Released: 03/10/2015 Document Revised: 03/31/2016 Document Reviewed: 11/19/2014 Elsevier Interactive Patient Education  2017 [employer].

## 2017-06-15 LAB — BASIC METABOLIC PANEL WITH GFR
BUN: 12 mg/dL (ref 7–25)
CALCIUM: 9.5 mg/dL (ref 8.6–10.4)
CO2: 20 mmol/L (ref 20–32)
Chloride: 106 mmol/L (ref 98–110)
Creat: 0.98 mg/dL (ref 0.50–1.05)
GFR, Est African American: 74 mL/min (ref >=60)
GFR, Est Non African American: 64 mL/min (ref >=60)
GLUCOSE: 84 mg/dL (ref 65–99)
Potassium: 4.1 mmol/L (ref 3.5–5.3)
Sodium: 141 mmol/L (ref 135–146)

## 2017-06-15 MED FILL — ESTRADIOL 1 MG TABLET: 1 | 30 days supply | Qty: 30 | Fill #6

## 2017-06-15 MED FILL — ISOSORBIDE MN ER 30 MG TAB: 30 | 30 days supply | Qty: 15 | Fill #3

## 2017-06-15 MED FILL — FERROUS SULFATE 325 MG TAB: 325 (65 FE) | 30 days supply | Qty: 30 | Fill #3

## 2017-06-15 MED FILL — OMEPRAZOLE DR 20 MG CAPSULE: 20 | 30 days supply | Qty: 30 | Fill #3

## 2017-06-20 MED FILL — ATORVASTATIN 80 MG TABLET: 80 | 30 days supply | Qty: 30 | Fill #5

## 2017-06-20 MED FILL — LISINOPRIL 2.5 MG TABLET: 2.5 | 30 days supply | Qty: 30 | Fill #5

## 2017-06-27 MED FILL — METOPROLOL TARTRATE 25 MG T: 25 | 30 days supply | Qty: 30 | Fill #4

## 2017-07-18 ENCOUNTER — Other Ambulatory Visit: Payer: Self-pay | Admitting: Family Medicine

## 2017-07-18 DIAGNOSIS — I10 Essential (primary) hypertension: Secondary | ICD-10-CM

## 2017-07-18 DIAGNOSIS — E119 Type 2 diabetes mellitus without complications: Secondary | ICD-10-CM

## 2017-07-18 MED FILL — FERROUS SULFATE 325 MG TAB: 325 (65 FE) | 30 days supply | Qty: 30 | Fill #4

## 2017-07-18 MED FILL — METOPROLOL TARTRATE 25 MG T: 25 | 30 days supply | Qty: 30 | Fill #5

## 2017-07-18 MED FILL — OMEPRAZOLE DR 20 MG CAPSULE: 20 | 30 days supply | Qty: 30 | Fill #4

## 2017-07-18 MED FILL — LISINOPRIL 2.5 MG TABLET: 2.5 | 30 days supply | Qty: 90 | Fill #0

## 2017-07-18 MED FILL — ISOSORBIDE MN ER 30 MG TAB: 30 | 30 days supply | Qty: 15 | Fill #4

## 2017-08-22 MED FILL — ISOSORBIDE MN ER 30 MG TAB: 30 | 30 days supply | Qty: 15 | Fill #5

## 2017-08-22 MED FILL — ESTRADIOL 1 MG TABLET: 1 | 30 days supply | Qty: 30 | Fill #0

## 2017-08-22 MED FILL — OMEPRAZOLE DR 20 MG CAPSULE: 20 | 30 days supply | Qty: 30 | Fill #5

## 2017-08-22 MED FILL — METOPROLOL TARTRATE 25 MG T: 25 | 30 days supply | Qty: 30 | Fill #6

## 2017-08-22 MED FILL — FERROUS SULFATE 325 MG TAB: 325 (65 FE) | 30 days supply | Qty: 30 | Fill #5

## 2017-09-14 ENCOUNTER — Encounter: Payer: Self-pay | Admitting: Family Medicine

## 2017-09-14 ENCOUNTER — Ambulatory Visit (INDEPENDENT_AMBULATORY_CARE_PROVIDER_SITE_OTHER): Payer: Medicaid Other | Admitting: Family Medicine

## 2017-09-14 VITALS — BP 126/66 | HR 70 | Temp 98.1°F | Resp 16 | Ht 66.0 in | Wt 177.0 lb

## 2017-09-14 DIAGNOSIS — Z794 Long term (current) use of insulin: Secondary | ICD-10-CM | POA: Diagnosis not present

## 2017-09-14 DIAGNOSIS — E1121 Type 2 diabetes mellitus with diabetic nephropathy: Secondary | ICD-10-CM | POA: Diagnosis not present

## 2017-09-14 DIAGNOSIS — Z23 Encounter for immunization: Secondary | ICD-10-CM | POA: Diagnosis not present

## 2017-09-14 DIAGNOSIS — E785 Hyperlipidemia, unspecified: Secondary | ICD-10-CM

## 2017-09-14 DIAGNOSIS — I10 Essential (primary) hypertension: Secondary | ICD-10-CM | POA: Diagnosis not present

## 2017-09-14 LAB — POCT URINALYSIS DIP (DEVICE)
Bilirubin Urine: NEGATIVE
Glucose, UA: NEGATIVE mg/dL
Ketones, ur: NEGATIVE mg/dL
Leukocytes, UA: NEGATIVE
Nitrite: NEGATIVE
PROTEIN: NEGATIVE mg/dL
Specific Gravity, Urine: 1.02 (ref 1.005–1.030)
UROBILINOGEN UA: 0.2 mg/dL (ref 0.0–1.0)
pH: 6 (ref 5.0–8.0)

## 2017-09-14 LAB — BASIC METABOLIC PANEL WITH GFR
BUN: 14 mg/dL (ref 7–25)
CO2: 29 mmol/L (ref 20–32)
Calcium: 9.2 mg/dL (ref 8.6–10.4)
Chloride: 106 mmol/L (ref 98–110)
Creat: 0.96 mg/dL (ref 0.50–1.05)
GFR, EST AFRICAN AMERICAN: 76 mL/min/{1.73_m2} (ref >=60)
GFR, EST NON AFRICAN AMERICAN: 66 mL/min/{1.73_m2} (ref >=60)
Glucose, Bld: 105 mg/dL — ABNORMAL HIGH (ref 65–99)
POTASSIUM: 4.4 mmol/L (ref 3.5–5.3)
Sodium: 141 mmol/L (ref 135–146)

## 2017-09-14 LAB — POCT GLYCOSYLATED HEMOGLOBIN (HGB A1C): Hemoglobin A1C: 6

## 2017-09-14 NOTE — Progress Notes (Signed)
59

## 2017-09-14 NOTE — Progress Notes (Signed)
Subjective:    Patient ID: Virginia [name], female    DOB: 1961-05-19, 56 y.o.   MRN: [id number]  Virginia [name], a 56 year female with a history of hypertension, diabetes, and hyperlipidemia presents for a follow up of chronic conditions. Virginia [name] states that she feels well overall and is without complaint.  Virginia [name] has been taking all medications consistently and continues to follow a carbohydrate modified low-fat diet.  She also exercises routinely.  She denies fatigue, dizziness, shortness of breath, chest pains, polyuria, polydipsia, or polyphagia.  Her cardiac risk factors include history of coronary artery disease.   Hypertension  This is a chronic problem. The problem is controlled. Pertinent negatives include no blurred vision, headaches, neck pain, orthopnea, palpitations, peripheral edema, PND or sweats. There are no associated agents to hypertension. Risk factors for coronary artery disease include dyslipidemia and post-menopausal state. Past treatments include ACE inhibitors and beta blockers. Compliance problems include diet.  There is no history of kidney disease, CAD/MI, CVA, heart failure, left ventricular hypertrophy or retinopathy. There is no history of chronic renal disease, coarctation of the aorta, hyperaldosteronism, hypercortisolism, hyperparathyroidism, sleep apnea or a thyroid problem.  Diabetes  She has type 2 diabetes mellitus. There are no hypoglycemic associated symptoms. Pertinent negatives for hypoglycemia include no headaches or sweats. Associated symptoms include foot paresthesias (right great toe and left upper extremity). Pertinent negatives for diabetes include no blurred vision, no fatigue, no polydipsia, no polyphagia, no polyuria, no visual change, no weakness and no weight loss. Pertinent negatives for hypoglycemia complications include no blackouts. Symptoms are improving. Pertinent negatives for diabetic complications include no CVA, heart disease,  peripheral neuropathy or retinopathy. Risk factors for coronary artery disease include dyslipidemia, sedentary lifestyle and post-menopausal. Current diabetic treatment includes oral agent (monotherapy). She is compliant with treatment all of the time. Her weight is stable. She is following a diabetic diet. She participates in exercise intermittently. An ACE inhibitor/angiotensin II receptor blocker is being taken. She does not see a podiatrist.Eye exam is current.     Past Medical History:  Diagnosis Date  . Anemia   . CAD (coronary artery disease)    a. 06/2015 BMS x2 to LCx. b. recurrent CP, cath 09/18/2015 patent stent, tiny diag stenosis medical therapy.   . CHF (congestive heart failure) ([facility])   . Diabetes mellitus without complication ([place])   . GERD (gastroesophageal reflux disease)    ?  Marland Kitchen Hyperlipidemia with target LDL less than 70   . Hypertension   . Shortness of breath dyspnea    with exertion  . STEMI (ST elevation myocardial infarction) ([place]) 06/16/2015   BMS x 2 CFX  . Thyroid nodule    benign   Immunization History  Administered Date(s) Administered  . Influenza,inj,Quad PF,6+ Mos 09/18/2015, 12/14/2016  . Pneumococcal Polysaccharide-23 09/18/2015  . Tdap 03/21/2012    Diabetic Foot Exam - Simple   No data filed    Body mass index is 28.57 kg/m. Review of Systems  Constitutional: Negative for fatigue, unexpected weight change and weight loss.  HENT: Negative.   Eyes: Negative.  Negative for blurred vision.  Respiratory: Negative for cough, chest tightness and wheezing.   Cardiovascular: Negative for palpitations, orthopnea, leg swelling and PND.  Gastrointestinal: Negative.   Endocrine: Negative.  Negative for polydipsia, polyphagia and polyuria.  Musculoskeletal: Negative for neck pain.  Skin: Negative.   Allergic/Immunologic: Negative for immunocompromised state.  Neurological: Negative.  Negative for weakness and headaches.  Hematological:  Negative.    Psychiatric/Behavioral: Negative.         Objective:   Physical Exam  Constitutional: She is oriented to person, place, and time.  HENT:  Head: Normocephalic and atraumatic.  Right Ear: External ear normal.  Left Ear: External ear normal.  Nose: Nose normal.  Mouth/Throat: Oropharynx is clear and moist.  Eyes: Conjunctivae and EOM are normal. Pupils are equal, round, and reactive to light. No scleral icterus.  Neck: Normal range of motion. Neck supple.  Cardiovascular: Normal rate, normal heart sounds and intact distal pulses.  Pulmonary/Chest: Effort normal and breath sounds normal.  Abdominal: Soft. Bowel sounds are normal.  Genitourinary: Vagina normal and uterus normal.  Neurological: She is alert and oriented to person, place, and time. She has normal reflexes.  Monofilament test negative  Skin: Skin is warm and dry.  Psychiatric: She has a normal mood and affect. Her behavior is normal. Judgment and thought content normal.  Vitals reviewed.    BP 126/66 (BP Location: Right Arm, Patient Position: Sitting, Cuff Size: Large)   Pulse 70   Temp 98.1 F (36.7 C) (Oral)   Resp 16   Ht 5\' 6"  (1.676 m)   Wt 177 lb (80.3 kg)   LMP 12/29/2015   SpO2 100%   BMI 28.57 kg/m  Assessment & Plan:  1. Controlled type 2 diabetes mellitus with diabetic nephropathy, with long-term current use of insulin (HCC) Hemoglobin A1c is 6.0, which is at goal.  No medication changes warranted.  We will continue metformin 500 mg daily.  Also continue a carbohydrate modified diet. - HgB G2X - BASIC METABOLIC PANEL WITH GFR  2. Essential hypertension Blood pressure is at goal on current medication regimen. - Continue medication, monitor blood pressure at home. Continue DASH diet. Reminder to go to the ER if any CP, SOB, nausea, dizziness, severe HA, changes vision/speech, left arm numbness and tingling and jaw pain.    - BASIC METABOLIC PANEL WITH GFR  3. Influenza vaccination given - Flu  Vaccine QUAD 36+ mos IM (Fluarix & Fluzone Quad PF  4. Hyperlipidemia with target LDL less than 70 The ASCVD Risk score Mikey Bussing DC Jr., et al., 2013) failed to calculate for the following reasons:   The patient has a prior MI or stroke diagnosis - Lipid Panel; Future   RTC: 3 months for fasting cholesterol. 6 months for chronic conditions   Donia Pounds  MSN, FNP-C [facility] [address] [place], [place] [address] [phone]

## 2017-09-14 NOTE — Patient Instructions (Signed)
Your hemoglobin A1c is 6.0 which is at goal.  Will continue metformin 500 mg daily with breakfast.  Also, continue a low-fat, low carbohydrate diet divided over 5-6 small meals throughout the day.  Increase water intake to 6-8 glasses.  Also recommend 150 minutes of low impact cardiovascular exercise per week.  Your blood pressure is at goal today.  No medication changes warranted.  We will recheck cholesterol panel in 3 months and will follow-up in office in 6 months for chronic conditions.   Your A1C goal is less than 7. Your fasting blood sugar  Upon awakening goal is between 110-140.  Your LDL  (bad cholesterol) goal is less than 100 Blood pressure goal is <140/90.  Recommend a lowfat, low carbohydrate diet divided over 5-6 small meals, increase water intake to 6-8 glasses, and 150 minutes per week of cardiovascular exercise.   Take your medications as prescribed Make sure that you are familiar with each one of your medications and what they are used to treat.  If you are unsure of medications, please bring to follow up Will send referral for eye exam  Please keep your scheduled follow up appointment.

## 2017-09-20 ENCOUNTER — Other Ambulatory Visit: Payer: Self-pay | Admitting: Family Medicine

## 2017-09-20 DIAGNOSIS — K219 Gastro-esophageal reflux disease without esophagitis: Secondary | ICD-10-CM

## 2017-09-20 DIAGNOSIS — E119 Type 2 diabetes mellitus without complications: Secondary | ICD-10-CM

## 2017-09-20 MED FILL — ESTRADIOL 1 MG TABLET: 1 | 30 days supply | Qty: 30 | Fill #1

## 2017-09-20 MED FILL — ISOSORBIDE MN ER 30 MG TAB: 30 | 30 days supply | Qty: 15 | Fill #6

## 2017-09-20 MED FILL — LISINOPRIL 2.5 MG TABLET: 2.5 | 10 days supply | Qty: 30 | Fill #1

## 2017-09-20 MED FILL — GABAPENTIN 300 MG CAPSULE: 300 | 30 days supply | Qty: 30 | Fill #1

## 2017-09-20 MED FILL — METOPROLOL TARTRATE 25 MG T: 25 | 30 days supply | Qty: 30 | Fill #7

## 2017-09-20 MED FILL — ATORVASTATIN 40 MG TABLET: 40 | 30 days supply | Qty: 30 | Fill #1

## 2017-09-20 MED FILL — metFORMIN HCL 500 MG TABS: 500 | 30 days supply | Qty: 30 | Fill #1

## 2017-09-21 MED FILL — OMEPRAZOLE DR 20 MG CAPSULE: 20 | 30 days supply | Qty: 30 | Fill #0

## 2017-09-21 MED FILL — FERROUS SULFATE 325 MG TAB: 325 (65 FE) | 30 days supply | Qty: 30 | Fill #0

## 2017-10-27 MED FILL — ISOSORBIDE MN ER 30 MG TAB: 30 | 30 days supply | Qty: 15 | Fill #7

## 2017-10-27 MED FILL — OMEPRAZOLE DR 20 MG CAPSULE: 20 | 30 days supply | Qty: 30 | Fill #1

## 2017-11-15 MED FILL — FERROUS SULFATE 325 MG TAB: 325 (65 FE) | 30 days supply | Qty: 30 | Fill #1

## 2017-11-15 MED FILL — METOPROLOL TARTRATE 25 MG T: 25 | 30 days supply | Qty: 30 | Fill #8

## 2017-11-15 MED FILL — LISINOPRIL 2.5 MG TABLET: 2.5 | 10 days supply | Qty: 30 | Fill #2

## 2017-11-15 MED FILL — ESTRADIOL 1 MG TABLET: 1 | 30 days supply | Qty: 30 | Fill #2

## 2017-11-15 MED FILL — metFORMIN HCL 500 MG TABS: 500 | 30 days supply | Qty: 30 | Fill #2

## 2017-11-15 MED FILL — ATORVASTATIN 40 MG TABLET: 40 | 30 days supply | Qty: 30 | Fill #2

## 2017-11-16 ENCOUNTER — Encounter (HOSPITAL_COMMUNITY): Payer: Self-pay

## 2017-11-16 ENCOUNTER — Emergency Department (HOSPITAL_COMMUNITY): Payer: Medicaid Other

## 2017-11-16 ENCOUNTER — Observation Stay (HOSPITAL_COMMUNITY)
Admission: EM | Admit: 2017-11-16 | Discharge: 2017-11-17 | Disposition: A | Discharge status: Home / Self Care | Payer: Medicaid Other | Attending: Cardiology | Admitting: Cardiology

## 2017-11-16 ENCOUNTER — Other Ambulatory Visit: Payer: Self-pay

## 2017-11-16 DIAGNOSIS — Z7982 Long term (current) use of aspirin: Secondary | ICD-10-CM | POA: Insufficient documentation

## 2017-11-16 DIAGNOSIS — I249 Acute ischemic heart disease, unspecified: Principal | ICD-10-CM

## 2017-11-16 DIAGNOSIS — I2510 Atherosclerotic heart disease of native coronary artery without angina pectoris: Secondary | ICD-10-CM | POA: Diagnosis not present

## 2017-11-16 DIAGNOSIS — R079 Chest pain, unspecified: Secondary | ICD-10-CM | POA: Diagnosis not present

## 2017-11-16 DIAGNOSIS — M549 Dorsalgia, unspecified: Secondary | ICD-10-CM | POA: Insufficient documentation

## 2017-11-16 DIAGNOSIS — E119 Type 2 diabetes mellitus without complications: Secondary | ICD-10-CM

## 2017-11-16 DIAGNOSIS — I509 Heart failure, unspecified: Secondary | ICD-10-CM | POA: Insufficient documentation

## 2017-11-16 DIAGNOSIS — Z79899 Other long term (current) drug therapy: Secondary | ICD-10-CM | POA: Diagnosis not present

## 2017-11-16 DIAGNOSIS — I252 Old myocardial infarction: Secondary | ICD-10-CM | POA: Insufficient documentation

## 2017-11-16 DIAGNOSIS — N289 Disorder of kidney and ureter, unspecified: Secondary | ICD-10-CM

## 2017-11-16 DIAGNOSIS — R110 Nausea: Secondary | ICD-10-CM | POA: Diagnosis not present

## 2017-11-16 DIAGNOSIS — I110 Hypertensive heart disease with heart failure: Secondary | ICD-10-CM | POA: Insufficient documentation

## 2017-11-16 DIAGNOSIS — Z7984 Long term (current) use of oral hypoglycemic drugs: Secondary | ICD-10-CM | POA: Insufficient documentation

## 2017-11-16 DIAGNOSIS — Z9861 Coronary angioplasty status: Secondary | ICD-10-CM

## 2017-11-16 LAB — CBC
HCT: 42.2 % (ref 36.0–46.0)
Hemoglobin: 13.5 g/dL (ref 12.0–15.0)
MCH: 29.6 pg (ref 26.0–34.0)
MCHC: 32 g/dL (ref 30.0–36.0)
MCV: 92.5 fL (ref 78.0–100.0)
PLATELETS: 184 10*3/uL (ref 150–400)
RBC: 4.56 MIL/uL (ref 3.87–5.11)
RDW: 13.8 % (ref 11.5–15.5)
WBC: 6 10*3/uL (ref 4.0–10.5)

## 2017-11-16 LAB — BASIC METABOLIC PANEL
Anion gap: 8 (ref 5–15)
BUN: 19 mg/dL (ref 6–20)
CO2: 25 mmol/L (ref 22–32)
CREATININE: 1.23 mg/dL — AB (ref 0.44–1.00)
Calcium: 9.1 mg/dL (ref 8.9–10.3)
Chloride: 102 mmol/L (ref 101–111)
GFR calc non Af Amer: 48 mL/min — ABNORMAL LOW (ref >60)
GFR, EST AFRICAN AMERICAN: 55 mL/min — AB (ref >60)
Glucose, Bld: 108 mg/dL — ABNORMAL HIGH (ref 65–99)
Potassium: 4.6 mmol/L (ref 3.5–5.1)
SODIUM: 135 mmol/L (ref 135–145)

## 2017-11-16 LAB — I-STAT BETA HCG BLOOD, ED (MC, WL, AP ONLY): I-stat hCG, quantitative: <5 m[IU]/mL (ref <5)

## 2017-11-16 LAB — HEPARIN LEVEL (UNFRACTIONATED): Heparin Unfractionated: 0.69 IU/mL (ref 0.30–0.70)

## 2017-11-16 LAB — I-STAT TROPONIN, ED
TROPONIN I, POC: 0 ng/mL (ref 0.00–0.08)
Troponin i, poc: 0.01 ng/mL (ref 0.00–0.08)

## 2017-11-16 LAB — TROPONIN I: Troponin I: <0.03 ng/mL (ref <0.03)

## 2017-11-16 MED ORDER — SODIUM CHLORIDE 0.9 % IV BOLUS (SEPSIS)
1000.0000 mL | Freq: Once | INTRAVENOUS | Status: AC
  Administered 2017-11-16: 1000 mL via INTRAVENOUS

## 2017-11-16 MED ORDER — NITROGLYCERIN 0.4 MG SL SUBL
0.4000 mg | SUBLINGUAL_TABLET | SUBLINGUAL | Status: DC | PRN
  Administered 2017-11-16: 0.4 mg via SUBLINGUAL
  Filled 2017-11-16: qty 1

## 2017-11-16 MED ORDER — NITROGLYCERIN 0.4 MG SL SUBL: 0.4000 mg | SUBLINGUAL_TABLET | SUBLINGUAL | Status: DC | PRN

## 2017-11-16 MED ORDER — ASPIRIN EC 81 MG PO TBEC
81.0000 mg | DELAYED_RELEASE_TABLET | Freq: Every day | ORAL | Status: DC
  Administered 2017-11-16 – 2017-11-17 (×2): 81 mg via ORAL
  Filled 2017-11-16 (×2): qty 1

## 2017-11-16 MED ORDER — HEPARIN BOLUS VIA INFUSION
4000.0000 [IU] | Freq: Once | INTRAVENOUS | Status: AC
  Administered 2017-11-16: 4000 [IU] via INTRAVENOUS
  Filled 2017-11-16: qty 4000

## 2017-11-16 MED ORDER — ATORVASTATIN CALCIUM 40 MG PO TABS
40.0000 mg | ORAL_TABLET | Freq: Every day | ORAL | Status: DC
  Administered 2017-11-16: 40 mg via ORAL
  Filled 2017-11-16: qty 1

## 2017-11-16 MED ORDER — IOPAMIDOL (ISOVUE-370) INJECTION 76%
INTRAVENOUS | Status: AC
  Administered 2017-11-16: 100 mL via INTRAVENOUS
  Filled 2017-11-16: qty 100

## 2017-11-16 MED ORDER — VITAMIN B-12 1000 MCG PO TABS
1000.0000 ug | ORAL_TABLET | Freq: Every day | ORAL | Status: DC
  Administered 2017-11-16 – 2017-11-17 (×2): 1000 ug via ORAL
  Filled 2017-11-16 (×2): qty 1

## 2017-11-16 MED ORDER — HEPARIN (PORCINE) IN NACL 100-0.45 UNIT/ML-% IJ SOLN
900.0000 [IU]/h | INTRAMUSCULAR | Status: DC
  Administered 2017-11-16: 900 [IU]/h via INTRAVENOUS
  Filled 2017-11-16: qty 250

## 2017-11-16 MED ORDER — ISOSORBIDE MONONITRATE ER 30 MG PO TB24
15.0000 mg | ORAL_TABLET | Freq: Every day | ORAL | Status: DC
  Administered 2017-11-16 – 2017-11-17 (×2): 15 mg via ORAL
  Filled 2017-11-16 (×2): qty 1

## 2017-11-16 MED ORDER — ACETAMINOPHEN 325 MG PO TABS: 650.0000 mg | ORAL_TABLET | ORAL | Status: DC | PRN

## 2017-11-16 MED ORDER — LISINOPRIL 2.5 MG PO TABS
2.5000 mg | ORAL_TABLET | Freq: Every day | ORAL | Status: DC
  Administered 2017-11-16: 2.5 mg via ORAL
  Filled 2017-11-16 (×2): qty 1

## 2017-11-16 MED ORDER — FERROUS SULFATE 325 (65 FE) MG PO TABS: 325.0000 mg | ORAL_TABLET | Freq: Every day | ORAL | Status: DC

## 2017-11-16 MED ORDER — ONDANSETRON HCL 4 MG/2ML IJ SOLN: 4.0000 mg | Freq: Four times a day (QID) | INTRAMUSCULAR | Status: DC | PRN

## 2017-11-16 MED ORDER — MORPHINE SULFATE (PF) 4 MG/ML IV SOLN
4.0000 mg | Freq: Once | INTRAVENOUS | Status: AC
  Administered 2017-11-16: 4 mg via INTRAVENOUS
  Filled 2017-11-16: qty 1

## 2017-11-16 MED ORDER — METOPROLOL TARTRATE 12.5 MG HALF TABLET
12.5000 mg | ORAL_TABLET | Freq: Two times a day (BID) | ORAL | Status: DC
  Administered 2017-11-17: 12.5 mg via ORAL
  Filled 2017-11-16: qty 1

## 2017-11-16 NOTE — ED Notes (Signed)
Phlebotomy here to draw labs. 

## 2017-11-16 NOTE — ED Notes (Signed)
Pt asleep. Upon waking, states pain is 4 of 10. Will continue to monitor.

## 2017-11-16 NOTE — ED Notes (Signed)
Pt states that pain is now 1 of 10.

## 2017-11-16 NOTE — ED Notes (Signed)
Pt brought back to room from CT because pt reports she can not have scan until she urinates. This RN assisted pt on bedpan

## 2017-11-16 NOTE — Progress Notes (Signed)
[place] for heparin Indication: chest pain/ACS  Heparin Dosing Weight: 75.7 kg  Labs: Recent Labs    11/16/17 0946  HGB 13.5  HCT 42.2  PLT 184  CREATININE 1.23*    Assessment: 43 yof presenting with CP. Pharmacy consulted to dose heparin for ACS. Not on anticoagulation PTA. CBC wnl. No bleed documented. For Lexiscan stress tomorrow per Cards.  Goal of Therapy:  Heparin level 0.3-0.7 units/ml Monitor platelets by anticoagulation protocol: Yes   Plan:  Heparin 4000 unit bolus Start heparin at 900 units/h 6h heparin level Daily heparin level/CBC Monitor s/sx bleeding  Elicia Lamp, PharmD, BCPS Clinical Pharmacist 11/16/2017 4:27 PM

## 2017-11-16 NOTE — ED Notes (Signed)
Pt declined another NTG at this time and states her chest feels much better. Cardiologist at bedside

## 2017-11-16 NOTE — ED Notes (Signed)
CT notified pt ready for scan 

## 2017-11-16 NOTE — ED Notes (Signed)
Called lab for someone to draw blood on pt. Will send someone up.

## 2017-11-16 NOTE — ED Notes (Signed)
Pt ambulatory to restroom with steady gait.

## 2017-11-16 NOTE — ED Notes (Signed)
Patient transported to CT 

## 2017-11-16 NOTE — ED Notes (Signed)
Cardiology at bedside.

## 2017-11-16 NOTE — ED Triage Notes (Signed)
Per Pt, Pt is coming from home with complaints of nagging pain in left chest that radiates to her back and left arm that started this morning. Reports some nausea.

## 2017-11-16 NOTE — H&P (Signed)
Cardiology History and Physical:   Patient ID: Virginia [name]; [id number]; 06-Apr-1961   Admit date: 11/16/2017 Date of Consult: 11/16/2017  Primary Care Provider: Tresa Garter, MD Primary Cardiologist: Dr. Irish Lack Primary Electrophysiologist: NA  Patient Profile:   Virginia [name] is a 57 y.o. female with a hx of CAD s/p STEMI with BMS x2 placement to LCx in 06/2015, recurrent chest pain with re-look cath 09/2015 with patent stents and 95% 2nd diag and 60% prox LAD with recommendations to tx medically, hx of GI bleed on Brilinta (2016), DM II, HLD, and HTN who is being seen today for the evaluation of chest pain  at the request of  Dr. Melina Copa.   History of Present Illness:   Virginia [name] is a pleasant 57yo F with a known hx of CAD s/p BMS x2 placement to the LCx in 06/2015 with recurrent chest pain 09/2015 with subsequent cath revealing patent stents with a tiny diagonal 95% stenosis with  recommendations to treat medically at that time. She was started on Brilinata s/p stent placement which was held for for a few weeks r/t complications with  GI bleeding in 09/2015. She remained on ASA, BB, ACEI, nitrates, and statins.   Pt has been following with her GP and was last seen in the office 11/270 without complications. She admits that she has been doing well up until this point and is compliant with her medications. She admits to normally watching her diet, however was eating foods high in fat and salt over the holidays  Early this morning, approximately 3am, she woke up from sleep to a dull, aching back pain (2/10) which radiated to her chest, left arm, and jaw. She was mildly nauseated at this time, took two baby ASA and went back to sleep for a bit. The pain woke her once again a few hours later. She admits that the pain was constant in duration by this point and was gradually worsening. She ate a banana, took her morning medications, and then proceeded to take her daughter to work. By  the time she dropped her daughter off, the pain was a 10/10 and she drove herself to [facility]. She admits to about 6 weeks of intermittent SOB and LE swelling that is unusual for her.   Due to her severe back pain, there was concern for aortic dissection despite bilateral, equal pulses and no neurological changes. A CT angio was performed that was negative for aneurysm, PE, or dissection. CXR showed no acute cardiopulmonary disease. EKG was unremarkable, NSR 73 not concerning for acute ischemia. Her initial troponin levels have been negative, 0.0. creatinine is slightly elevated at 1.23.  She has been given 4mg  morphine and 0.4SL nitro without much relief. She continues to have 5/10 chest pain. VSS. Will discuss plan with MD.    Past Medical History:  Diagnosis Date  . Anemia   . CAD (coronary artery disease)    a. 06/2015 BMS x2 to LCx. b. recurrent CP, cath 09/18/2015 patent stent, tiny diag stenosis medical therapy.   . CHF (congestive heart failure) ([place])   . Diabetes mellitus without complication ([place])   . GERD (gastroesophageal reflux disease)    ?  Marland Kitchen Hyperlipidemia with target LDL less than 70   . Hypertension   . Shortness of breath dyspnea    with exertion  . STEMI (ST elevation myocardial infarction) ([place]) 06/16/2015   BMS x 2 CFX  . Thyroid nodule    benign  Past Surgical History:  Procedure Laterality Date  . ABDOMINAL HYSTERECTOMY N/A 02/02/2016   Procedure: HYSTERECTOMY ABDOMINAL WITH BILATERAL SALPINGECTOMY;  Surgeon: Woodroe Mode, MD;  Location: [facility];  Service: Gynecology;  Laterality: N/A;  . APPENDECTOMY    . CARDIAC CATHETERIZATION N/A 06/17/2015   Procedure: Left Heart Cath and Coronary Angiography;  Surgeon: Jettie Booze, MD;  Location: [facility];  Service: Cardiovascular;  Laterality: N/A;  . CARDIAC CATHETERIZATION  06/17/2015   Procedure: Coronary Stent Intervention;  Surgeon: Jettie Booze, MD;  Location: [facility];  Service:  Cardiovascular;;  . CARDIAC CATHETERIZATION N/A 09/18/2015   Procedure: Left Heart Cath and Coronary Angiography;  Surgeon: Troy Sine, MD;  Location: [facility];  Service: Cardiovascular;  Laterality: N/A;  . CESAREAN SECTION     Myomectomy done during cesarean section  . COLONOSCOPY N/A 09/20/2015   Procedure: COLONOSCOPY;  Surgeon: Carol Ada, MD;  Location[facility];  Service: Endoscopy;  Laterality: N/A;  . COLONOSCOPY    . ESOPHAGOGASTRODUODENOSCOPY N/A 09/20/2015   Procedure: ESOPHAGOGASTRODUODENOSCOPY (EGD);  Surgeon: Carol Ada, MD;  Location: [facility] ENDOSCOPY;  Service: Endoscopy;  Laterality: N/A;  . olympus  quick clip pro gastric body  10/09/15     Prior to Admission medications   Medication Sig Start Date End Date Taking? Authorizing Provider  aspirin EC 81 MG tablet Take 81 mg by mouth daily.   Yes [provider]  atorvastatin (LIPITOR) 40 MG tablet TAKE 1 TABLET BY MOUTH DAILY AT 6 PM 06/14/17  Yes Dorena Dew, FNP  estradiol (ESTRACE) 1 MG tablet Take 1 tablet (1 mg total) by mouth daily. 12/14/16  Yes Dorena Dew, FNP  FEROSUL 325 (65 Fe) MG tablet TAKE 1 TABLET BY MOUTH DAILY WITH BREAKFAST. 09/21/17  Yes Dorena Dew, FNP  isosorbide mononitrate (IMDUR) 30 MG 24 hr tablet Take 0.5 tablets (15 mg total) by mouth daily. 03/14/17  Yes Dorena Dew, FNP  lisinopril (PRINIVIL,ZESTRIL) 2.5 MG tablet TAKE 1 TABLET BY MOUTH DAILY. 07/18/17  Yes Dorena Dew, FNP  metFORMIN (GLUCOPHAGE) 500 MG tablet Take 1 tablet (500 mg total) by mouth daily with breakfast. 06/14/17  Yes Dorena Dew, FNP  metoprolol tartrate (LOPRESSOR) 25 MG tablet Take 0.5 tablets (12.5 mg total) by mouth 2 (two) times daily. 12/14/16  Yes Dorena Dew, FNP  vitamin B-12 (CYANOCOBALAMIN) 1000 MCG tablet Take 1 tablet (1,000 mcg total) by mouth daily. 03/29/16  Yes Tresa Garter, MD  gabapentin (NEURONTIN) 300 MG capsule Take 1 capsule (300 mg total) by  mouth at bedtime. Patient not taking: Reported on 11/16/2017 12/14/16   Dorena Dew, FNP  nitroGLYCERIN (NITROSTAT) 0.4 MG SL tablet Place 1 tablet (0.4 mg total) under the tongue every 5 (five) minutes as needed for chest pain. 06/14/17   Dorena Dew, FNP  omeprazole (PRILOSEC) 20 MG capsule TAKE 1 CAPSULE BY MOUTH DAILY. Patient not taking: Reported on 11/16/2017 09/21/17   Dorena Dew, FNP    Inpatient Medications: Scheduled Meds:  Continuous Infusions:  PRN Meds: nitroGLYCERIN  Allergies:   No Known Allergies  Social History:   Social History   Socioeconomic History  . Marital status: Married    Spouse name: Not on file  . Number of children: Not on file  . Years of education: Not on file  . Highest education level: Not on file  Social Needs  . Financial resource strain: Not on  file  . Food insecurity - worry: Not on file  . Food insecurity - inability: Not on file  . Transportation needs - medical: Not on file  . Transportation needs - non-medical: Not on file  Occupational History  . Not on file  Tobacco Use  . Smoking status: Never Smoker  . Smokeless tobacco: Never Used  Substance and Sexual Activity  . Alcohol use: No    Alcohol/week: 0.0 oz  . Drug use: No  . Sexual activity: No    Birth control/protection: Post-menopausal  Other Topics Concern  . Not on file  Social History Narrative  . Not on file    Family History:   Family History  Problem Relation Age of Onset  . Stroke Mother   . Heart disease Mother   . Diabetes Mother   . Cancer Father   . Hypertension Sister   . Hyperlipidemia Sister   . Heart attack Brother   . Hypertension Brother   . Heart attack Maternal Uncle   . Asthma Paternal Uncle   . Diabetes Paternal Uncle   . Diabetes Paternal Aunt   . Thyroid disease Neg Hx    Family Status:  Family Status  Relation Name Status  . Mother  Deceased  . Father bile duct Deceased  . Sister  (Not Specified)  . Brother  (Not  Specified)  . Mat Uncle  (Not Specified)  . Annamarie Major  (Not Specified)  . Ethlyn Daniels  (Not Specified)  . Neg Hx  (Not Specified)    ROS:  Please see the history of present illness.  All other ROS reviewed and negative.     Physical Exam/Data:   Vitals:   11/16/17 1415 11/16/17 1430 11/16/17 1445 11/16/17 1500  BP: (!) 136/108 (!) 149/84 (!) 144/91 (!) 144/86  Pulse: 71 63 63 71  Resp: 16 19 13 15   Temp:      SpO2: 99% 99% 100% 99%  Weight:      Height:        Intake/Output Summary (Last 24 hours) at 11/16/2017 1507 Last data filed at 11/16/2017 1239 Gross per 24 hour  Intake 999 ml  Output -  Net 999 ml   Filed Weights   11/16/17 0942  Weight: 175 lb (79.4 kg)   Body mass index is 28.25 kg/m.   General: Well developed, well nourished, NAD Skin: Warm, dry, intact  Head: Normocephalic, atraumatic, clear, moist mucus membranes. Neck: Negative for carotid bruits. No JVD Lungs:Clear to ausculation bilaterally. No wheezes, rales, or rhonchi. Breathing is unlabored. Cardiovascular: RRR with S1 S2. No murmurs, rubs, or gallops Abdomen: Soft, non-tender, non-distended with normoactive bowel sounds. No obvious abdominal masses. MSK: Strength and tone appear normal for age. 5/5 in all extremities Extremities: No edema. No clubbing or cyanosis. DP/PT pulses 2+ bilaterally Neuro: Alert and oriented. No focal deficits. No facial asymmetry. MAE spontaneously. Psych: Responds to questions appropriately with normal affect.    EKG:  The EKG was personally reviewed and demonstrates:  11/16/17 NSR Telemetry:  Telemetry was personally reviewed and demonstrates:  NSR 81  Relevant CV Studies:  ECHO: 06/18/2015: Study Conclusions  - Left ventricle: The cavity size was normal. Wall thickness was   normal. Systolic function was normal. The estimated ejection   fraction was in the range of 60% to 65%. Wall motion was normal;   there were no regional wall motion abnormalities. - Mitral  valve: There was mild regurgitation.   CATH:  09/18/2015:  Prox  LAD lesion, 60% stenosed.  2nd Diag lesion, 95% stenosed.  The left ventricular systolic function is normal.  Mid LAD to Dist LAD lesion, 20% stenosed.   Normal LV function without residual wall motion abnormality with an ejection fraction of 55-65%.  Two-vessel CAD with 60% eccentric smooth tubular ostial LAD stenosis, 20% mid stenosis in the region of a very small bifurcating diagonal vessel with 95% stenosis in the inferior limb of the small diagonal branch; widely patent tandem bare-metal stents in the AV groove circumflex vessel; normal RCA.  RECOMMENDATION: Increased medical therapy.  Nitrates will be added to her regimen of beta blocker, ACE inhibitor and statin.  Consider amlodipine if recurrent symptomatology.     Cath 06/17/2015:   Mid Cx lesion, 100% stenosed. There is a 0% residual stenosis post intervention. Two overlapping bare metal stents were placed.  The left ventricular systolic function is normal.   We chose a bare metal stent due to the fact that the patient admitted that she has issues obtaining medications. She has not been taking any medicines regularly. We should be able to get her 30 days worth of Brilinta before leaving the hospital and if she did not continue it for longer than 30 days, she should still avoid stent thrombosis.  Laboratory Data:  Chemistry Recent Labs  Lab 11/16/17 0946  NA 135  K 4.6  CL 102  CO2 25  GLUCOSE 108*  BUN 19  CREATININE 1.23*  CALCIUM 9.1  GFRNONAA 48*  GFRAA 55*  ANIONGAP 8    Total Protein  Date Value Ref Range Status  12/14/2016 7.0 6.1 - 8.1 g/dL Final   Albumin  Date Value Ref Range Status  12/14/2016 4.1 3.6 - 5.1 g/dL Final   AST  Date Value Ref Range Status  12/14/2016 25 10 - 35 U/L Final   ALT  Date Value Ref Range Status  12/14/2016 22 6 - 29 U/L Final   Alkaline Phosphatase  Date Value Ref Range Status  12/14/2016  86 33 - 130 U/L Final   Total Bilirubin  Date Value Ref Range Status  12/14/2016 0.4 0.2 - 1.2 mg/dL Final   Hematology Recent Labs  Lab 11/16/17 0946  WBC 6.0  RBC 4.56  HGB 13.5  HCT 42.2  MCV 92.5  MCH 29.6  MCHC 32.0  RDW 13.8  PLT 184   Cardiac EnzymesNo results for input(s): TROPONINI in the last 168 hours.  Recent Labs  Lab 11/16/17 1000  TROPIPOC 0.00    BNPNo results for input(s): BNP, PROBNP in the last 168 hours.  DDimer No results for input(s): DDIMER in the last 168 hours. TSH:  Lab Results  Component Value Date   TSH 0.70 12/14/2016   Lipids: Lab Results  Component Value Date   CHOL 130 12/14/2016   HDL 54 12/14/2016   LDLCALC 61 12/14/2016   TRIG 75 12/14/2016   CHOLHDL 2.4 12/14/2016   HgbA1c: Lab Results  Component Value Date   HGBA1C 6.0 09/14/2017    Radiology/Studies:  Dg Chest 2 View  Result Date: 11/16/2017 CLINICAL DATA:  Left chest pain radiating into the back and left arm beginning this morning. EXAM: CHEST  2 VIEW COMPARISON:  PA and lateral chest 03/07/2017 and 09/17/2015. FINDINGS: The lungs are clear. Heart size is normal. There is no pneumothorax or pleural effusion. Mild convex left thoracolumbar scoliosis noted. IMPRESSION: No acute disease. Electronically Signed   By: Inge Rise M.D.   On: 11/16/2017 10:08  Ct Angio Chest/abd/pel For Dissection W And/or W/wo  Result Date: 11/16/2017 CLINICAL DATA:  Upper chest pain and shortness of breath for several hours EXAM: CT ANGIOGRAPHY CHEST, ABDOMEN AND PELVIS TECHNIQUE: Multidetector CT imaging through the chest, abdomen and pelvis was performed using the standard protocol during bolus administration of intravenous contrast. Multiplanar reconstructed images and MIPs were obtained and reviewed to evaluate the vascular anatomy. CONTRAST:  100 mL Isovue 370. COMPARISON:  Chest x-ray from earlier in the same day. Ultrasound from 06/26/2015 FINDINGS: CTA CHEST FINDINGS Cardiovascular:  Thoracic aorta is well visualize without evidence of aneurysmal dilatation or dissection. No significant atherosclerotic changes are identified. Only minimal coronary calcifications are seen. The pulmonary artery as visualized shows no evidence of pulmonary emboli. No cardiac enlargement is noted. Mediastinum/Nodes: Thoracic inlet demonstrates evidence of a large 3.5 cm nodule extending from the inferior aspect of the left lobe of the thyroid into the superior mediastinum. This is stable in appearance from a prior ultrasound examination. No significant hilar or mediastinal adenopathy is noted. The esophagus is within normal limits. Lungs/Pleura: The lungs are well aerated bilaterally. Very minimal dependent atelectatic changes are seen. No focal infiltrate, effusion or pneumothorax is noted. No sizable parenchymal nodules are seen. Musculoskeletal: Degenerative changes of the thoracic spine are noted. No acute bony abnormality is seen. Review of the MIP images confirms the above findings. CTA ABDOMEN AND PELVIS FINDINGS VASCULAR Aorta: Mild atherosclerotic changes are noted without evidence of dissection or aneurysmal dilatation. Celiac: Patent without evidence of aneurysm, dissection, vasculitis or significant stenosis. SMA: Patent without evidence of aneurysm, dissection, vasculitis or significant stenosis. Renals: Single renal arteries are identified bilaterally. Mild eccentric plaque formation is noted in the proximal renal arteries right slightly greater than left. Very mild poststenotic dilatation is noted on the right. IMA: Patent without evidence of aneurysm, dissection, vasculitis or significant stenosis. Iliacs: Mild atherosclerotic changes are noted without aneurysmal dilatation or focal stenosis. Veins: No definitive venous abnormality is noted. Review of the MIP images confirms the above findings. NON-VASCULAR Hepatobiliary: No focal liver abnormality is seen. No gallstones, gallbladder wall thickening,  or biliary dilatation. Pancreas: Unremarkable. No pancreatic ductal dilatation or surrounding inflammatory changes. Spleen: Normal in size without focal abnormality. Adrenals/Urinary Tract: The adrenal glands are within normal limits. The kidneys demonstrate a normal enhancement pattern bilaterally. No obstructive changes are seen. The bladder is partially distended. Stomach/Bowel: The appendix has been surgically removed. No obstructive or inflammatory changes are noted within the bowel. Lymphatic: No significant lymphadenopathy is identified. Reproductive: Uterus has been surgically removed. Other: No free fluid is identified. No free air is seen. In the anterior abdomen just beneath the right rectus muscle there is a somewhat globular calcification identified which is stable in appearance from 2016. Given the stability it is felt to be benign in etiology. Musculoskeletal: Mild degenerative changes of the lumbar spine are noted. No compression deformities are seen. Mild anterolisthesis of L4 on L5 is noted. This is stable from the prior study. Review of the MIP images confirms the above findings. IMPRESSION: CTA of the chest: No evidence of aortic dissection or aneurysmal dilatation. No evidence of pulmonary emboli. Large left thyroid nodule stable from previous exams. CTA of the abdomen and pelvis: No aortic abnormality is identified. Postsurgical changes. Calcification in the anterior abdomen as described consistent with a benign etiology given its long-term stability. Electronically Signed   By: Inez Catalina M.D.   On: 11/16/2017 12:49    Assessment and Plan:  1.Chest pain: -Initial POC trop negative, 0.0> trend troponin I  -On ASA, took 2 baby ASA with initial CP at 0300 -BB, IMDUR, statin -EKG unremarkable -Continue to cycle enzymes -Ongoing chest pain 5/10, no associated symptoms  -For Lexiscan stress tomorrow, 11/17/17 -Will admit for observation  -Will start IV hep per pharm  2.  HTN: -Elevated, but stable, 154/87>141/80>140/85>137/77 -Metoprolol tartrate 25, lisinopril 2.5 -Continue current regimen   3. Acute renal insufficiency: -Cr 1.23. Baseline appears to be in the 0.9-1.1 range -Will continue to monitor BMET -Avoid nephrotoxic medications  4. DM II: -On metformin, will hold if plan is for cath  -Glucose on arrival, 84 -HB A1c, 09/14/17, 6.0. Trend 6.4>6.1>5.9>5.9>6.0 today   For questions or updates, please contact [place] Please consult [email or web address] for contact info under Cardiology/STEMI.   SignedKathyrn Drown, NP  11/16/2017 3:07 PM Pager: [phone]   History and all data above reviewed.  Patient examined.  I agree with the findings as above.  The patient has chest pain that is similar to previous angina.  However, it is atypical.  Reproducible with palpations.  The patient exam reveals COR:RRR  ,  Lungs: Clear  ,  Abd: Positive bowel sounds, no rebound no guarding, Ext No edema  .  All available labs, radiology testing, previous records reviewed. Agree with documented assessment and plan. Chest pain:  Atypical greater than typical.  No objective evidence of ischemia.  Plan rule out MI.  If enzymes negative then in patient [place].    Jeneen Rinks Abimael Zeiter  5:31 PM  11/16/2017

## 2017-11-16 NOTE — ED Notes (Signed)
Troponin was attempted to be collected by tech x1 unsuccessfully.

## 2017-11-16 NOTE — ED Provider Notes (Signed)
[facility] EMERGENCY DEPARTMENT Provider Note   CSN: [id number] Arrival date & time: 11/16/17  0071     History   Chief Complaint Chief Complaint  Patient presents with  . Chest Pain    HPI Virginia [name] is a 57 y.o. female.  The history is provided by the patient.  Chest Pain   This is a new problem. The current episode started 3 to 5 hours ago. The problem occurs constantly. The problem has been gradually worsening. The pain is associated with movement. The pain is present in the substernal region. The pain is at a severity of 10/10. The pain is severe. The quality of the pain is described as stabbing. The pain radiates to the upper back, left jaw and left shoulder. The symptoms are aggravated by certain positions. Associated symptoms include back pain and nausea. Pertinent negatives include no abdominal pain, no cough, no diaphoresis, no dizziness, no fever, no headaches, no leg pain, no numbness, no palpitations, no shortness of breath, no syncope, no vomiting and no weakness. She has tried nothing for the symptoms.  Her past medical history is significant for CAD, CHF and diabetes.  Pertinent negatives for past medical history include no seizures.    57 year old female with known coronary disease status post cath and stent 2 years ago here with substernal and left-sided chest pain radiating to her back left shoulder left side of her jaw since about 3 this morning that acutely got worse around 9 this morning.  She states the pain is similar to when she had her cardiac stents placed.  She is only had one other episode of chest pain since her cardiac event and she was here but she said the pain was not as intense as this.  Currently she rates this is a 12 out of 10 uncomfortable feeling that is worse with movement.  She denies any trauma or can think of any other activities that would have caused this pain today.  The pain onset was at rest and continues while she is  here.  The patient is on aspirin and took extra aspirin this morning with no change in her discomfort.  She has nitro but has not tried any of them.  Her symptoms are associated with nausea but she denies any shortness of breath numbness tingling weakness.  Past Medical History:  Diagnosis Date  . Anemia   . CAD (coronary artery disease)    a. 06/2015 BMS x2 to LCx. b. recurrent CP, cath 09/18/2015 patent stent, tiny diag stenosis medical therapy.   . CHF (congestive heart failure) ([place])   . Diabetes mellitus without complication ([place])   . GERD (gastroesophageal reflux disease)    ?  Marland Kitchen Hyperlipidemia with target LDL less than 70   . Hypertension   . Shortness of breath dyspnea    with exertion  . STEMI (ST elevation myocardial infarction) ([place]) 06/16/2015   BMS x 2 CFX  . Thyroid nodule    benign    Patient Active Problem List   Diagnosis Date Noted  . Gastroesophageal reflux disease without esophagitis 03/16/2017  . Blood pressure check 12/28/2016  . Bradycardia 12/14/2016  . Controlled type 2 diabetes mellitus without complication, without long-term current use of insulin ([place]) 03/29/2016  . Numbness of anterior thigh 03/29/2016  . Menopausal hot flushes 03/10/2016  . Essential hypertension 12/29/2015  . Fibroid uterus 11/13/2015  . Transient right leg weakness 10/05/2015  . Acute GI bleeding   . Symptomatic  anemia 09/19/2015  . Vasovagal near syncope 09/19/2015  . Dysfunctional uterine bleeding 09/19/2015  . Diarrhea due to drug 09/19/2015  . Heme positive stool 09/19/2015  . Chest pain 09/17/2015  . Multinodular goiter 07/09/2015  . CAD S/P CFX BMS (? med complinace) x 2 06/16/15 07/01/2015  . Acute renal insufficiency 07/01/2015  . Abdominal pain 07/01/2015  . Nodular goiter 06/23/2015  . Diabetes mellitus type 2, controlled ([place]) 06/23/2015  . STEMI -06/16/15 06/17/2015  . Hyperlipidemia with target LDL less than 70     Past Surgical History:  Procedure Laterality  Date  . ABDOMINAL HYSTERECTOMY N/A 02/02/2016   Procedure: HYSTERECTOMY ABDOMINAL WITH BILATERAL SALPINGECTOMY;  Surgeon: Woodroe Mode, MD;  Location: [facility];  Service: Gynecology;  Laterality: N/A;  . APPENDECTOMY    . CARDIAC CATHETERIZATION N/A 06/17/2015   Procedure: Left Heart Cath and Coronary Angiography;  Surgeon: Jettie Booze, MD;  Location: [facility];  Service: Cardiovascular;  Laterality: N/A;  . CARDIAC CATHETERIZATION  06/17/2015   Procedure: Coronary Stent Intervention;  Surgeon: Jettie Booze, MD;  Location: [facility];  Service: Cardiovascular;;  . CARDIAC CATHETERIZATION N/A 09/18/2015   Procedure: Left Heart Cath and Coronary Angiography;  Surgeon: Troy Sine, MD;  Location: [facility];  Service: Cardiovascular;  Laterality: N/A;  . CESAREAN SECTION     Myomectomy done during cesarean section  . COLONOSCOPY N/A 09/20/2015   Procedure: COLONOSCOPY;  Surgeon: Carol Ada, MD;  Location: [facility];  Service: Endoscopy;  Laterality: N/A;  . COLONOSCOPY    . ESOPHAGOGASTRODUODENOSCOPY N/A 09/20/2015   Procedure: ESOPHAGOGASTRODUODENOSCOPY (EGD);  Surgeon: Carol Ada, MD;  Location: [facility] ENDOSCOPY;  Service: Endoscopy;  Laterality: N/A;  . olympus  quick clip pro gastric body  10/09/15    OB History    Gravida Para Term Preterm AB Living   '2 2 2     2   ' SAB TAB Ectopic Multiple Live Births                   Home Medications    Prior to Admission medications   Medication Sig Start Date End Date Taking? Authorizing Provider  aspirin EC 81 MG tablet Take 81 mg by mouth daily.    [provider]  atorvastatin (LIPITOR) 40 MG tablet TAKE 1 TABLET BY MOUTH DAILY AT 6 PM 06/14/17   Dorena Dew, FNP  Blood Glucose Monitoring Suppl (TRUE METRIX METER) w/Device KIT Check BS once daily 12/29/15   Tresa Garter, MD  Elastic Bandages & Supports (MEDICAL COMPRESSION STOCKINGS) MISC 1 each by Does not apply route daily.  03/14/17   Dorena Dew, FNP  estradiol (ESTRACE) 1 MG tablet Take 1 tablet (1 mg total) by mouth daily. 12/14/16   Dorena Dew, FNP  FEROSUL 325 (65 Fe) MG tablet TAKE 1 TABLET BY MOUTH DAILY WITH BREAKFAST. 09/21/17   Dorena Dew, FNP  gabapentin (NEURONTIN) 300 MG capsule Take 1 capsule (300 mg total) by mouth at bedtime. 12/14/16   Dorena Dew, FNP  glucose blood (TRUE METRIX BLOOD GLUCOSE TEST) test strip Use as instructed 12/29/15   Tresa Garter, MD  isosorbide mononitrate (IMDUR) 30 MG 24 hr tablet Take 0.5 tablets (15 mg total) by mouth daily. 03/14/17   Dorena Dew, FNP  lisinopril (PRINIVIL,ZESTRIL) 2.5 MG tablet TAKE 1 TABLET BY MOUTH DAILY. 07/18/17   Dorena Dew, FNP  metFORMIN (GLUCOPHAGE) 500 MG tablet  Take 1 tablet (500 mg total) by mouth daily with breakfast. 06/14/17   Dorena Dew, FNP  metoprolol tartrate (LOPRESSOR) 25 MG tablet Take 0.5 tablets (12.5 mg total) by mouth 2 (two) times daily. 12/14/16   Dorena Dew, FNP  nitroGLYCERIN (NITROSTAT) 0.4 MG SL tablet Place 1 tablet (0.4 mg total) under the tongue every 5 (five) minutes as needed for chest pain. 06/14/17   Dorena Dew, FNP  omeprazole (PRILOSEC) 20 MG capsule TAKE 1 CAPSULE BY MOUTH DAILY. 09/21/17   Dorena Dew, FNP  TRUEPLUS LANCETS 28G MISC Check BS once daily 12/29/15   Tresa Garter, MD  vitamin B-12 (CYANOCOBALAMIN) 1000 MCG tablet Take 1 tablet (1,000 mcg total) by mouth daily. 03/29/16   Tresa Garter, MD    Family History Family History  Problem Relation Age of Onset  . Stroke Mother   . Heart disease Mother   . Diabetes Mother   . Cancer Father   . Hypertension Sister   . Hyperlipidemia Sister   . Heart attack Brother   . Hypertension Brother   . Heart attack Maternal Uncle   . Asthma Paternal Uncle   . Diabetes Paternal Uncle   . Diabetes Paternal Aunt   . Thyroid disease Neg Hx     Social History Social History   Tobacco Use    . Smoking status: Never Smoker  . Smokeless tobacco: Never Used  Substance Use Topics  . Alcohol use: No    Alcohol/week: 0.0 oz  . Drug use: No     Allergies   Patient has no known allergies.   Review of Systems Review of Systems  Constitutional: Negative for chills, diaphoresis and fever.  HENT: Negative for ear pain and sore throat.   Eyes: Negative for pain and visual disturbance.  Respiratory: Negative for cough and shortness of breath.   Cardiovascular: Positive for chest pain. Negative for palpitations and syncope.  Gastrointestinal: Positive for nausea. Negative for abdominal pain and vomiting.  Genitourinary: Negative for dysuria and hematuria.  Musculoskeletal: Positive for back pain. Negative for arthralgias.  Skin: Negative for color change and rash.  Neurological: Negative for dizziness, seizures, syncope, weakness, numbness and headaches.  All other systems reviewed and are negative.    Physical Exam Updated Vital Signs BP (!) 164/101 (BP Location: Right Arm)   Pulse 86   Temp 98.1 F (36.7 C)   Resp 18   Ht '5\' 6"'  (1.676 m)   Wt 79.4 kg (175 lb)   LMP 12/29/2015   SpO2 92%   BMI 28.25 kg/m   Physical Exam  Constitutional: She appears well-developed and well-nourished. No distress.  HENT:  Head: Normocephalic and atraumatic.  Eyes: Conjunctivae are normal.  Neck: Neck supple.  Cardiovascular: Normal rate, regular rhythm and intact distal pulses.  No murmur heard. Pulmonary/Chest: Effort normal and breath sounds normal. No respiratory distress.  Abdominal: Soft. There is no tenderness.  Musculoskeletal: Normal range of motion. She exhibits no edema.       Right lower leg: Normal. She exhibits no edema.       Left lower leg: Normal. She exhibits no edema.  Neurological: She is alert.  Skin: Skin is warm and dry.  Psychiatric: She has a normal mood and affect.  Nursing note and vitals reviewed.    ED Treatments / Results  Labs (all labs  ordered are listed, but only abnormal results are displayed) Labs Reviewed  BASIC METABOLIC PANEL - Abnormal; Notable for  the following components:      Result Value   Glucose, Bld 108 (*)    Creatinine, Ser 1.23 (*)    GFR calc non Af Amer 48 (*)    GFR calc Af Amer 55 (*)    All other components within normal limits  CBC  I-STAT TROPONIN, ED  I-STAT BETA HCG BLOOD, ED ([facility], [facility], [facility] ONLY)    EKG  EKG Interpretation  Date/Time:  Thursday November 16 2017 09:39:56 EST Ventricular Rate:  73 PR Interval:  132 QRS Duration: 68 QT Interval:  368 QTC Calculation: 405 R Axis:   68 Text Interpretation:  Normal sinus rhythm Normal ECG no acute st/ts, nl intervals Confirmed by Aletta Edouard [phone]) on 11/16/2017 10:55:24 AM       Radiology Dg Chest 2 View  Result Date: 11/16/2017 CLINICAL DATA:  Left chest pain radiating into the back and left arm beginning this morning. EXAM: CHEST  2 VIEW COMPARISON:  PA and lateral chest 03/07/2017 and 09/17/2015. FINDINGS: The lungs are clear. Heart size is normal. There is no pneumothorax or pleural effusion. Mild convex left thoracolumbar scoliosis noted. IMPRESSION: No acute disease. Electronically Signed   By: Inge Rise M.D.   On: 11/16/2017 10:08   Ct Angio Chest/abd/pel For Dissection W And/or W/wo  Result Date: 11/16/2017 CLINICAL DATA:  Upper chest pain and shortness of breath for several hours EXAM: CT ANGIOGRAPHY CHEST, ABDOMEN AND PELVIS TECHNIQUE: Multidetector CT imaging through the chest, abdomen and pelvis was performed using the standard protocol during bolus administration of intravenous contrast. Multiplanar reconstructed images and MIPs were obtained and reviewed to evaluate the vascular anatomy. CONTRAST:  100 mL Isovue 370. COMPARISON:  Chest x-ray from earlier in the same day. Ultrasound from 06/26/2015 FINDINGS: CTA CHEST FINDINGS Cardiovascular: Thoracic aorta is well visualize without evidence of aneurysmal dilatation or  dissection. No significant atherosclerotic changes are identified. Only minimal coronary calcifications are seen. The pulmonary artery as visualized shows no evidence of pulmonary emboli. No cardiac enlargement is noted. Mediastinum/Nodes: Thoracic inlet demonstrates evidence of a large 3.5 cm nodule extending from the inferior aspect of the left lobe of the thyroid into the superior mediastinum. This is stable in appearance from a prior ultrasound examination. No significant hilar or mediastinal adenopathy is noted. The esophagus is within normal limits. Lungs/Pleura: The lungs are well aerated bilaterally. Very minimal dependent atelectatic changes are seen. No focal infiltrate, effusion or pneumothorax is noted. No sizable parenchymal nodules are seen. Musculoskeletal: Degenerative changes of the thoracic spine are noted. No acute bony abnormality is seen. Review of the MIP images confirms the above findings. CTA ABDOMEN AND PELVIS FINDINGS VASCULAR Aorta: Mild atherosclerotic changes are noted without evidence of dissection or aneurysmal dilatation. Celiac: Patent without evidence of aneurysm, dissection, vasculitis or significant stenosis. SMA: Patent without evidence of aneurysm, dissection, vasculitis or significant stenosis. Renals: Single renal arteries are identified bilaterally. Mild eccentric plaque formation is noted in the proximal renal arteries right slightly greater than left. Very mild poststenotic dilatation is noted on the right. IMA: Patent without evidence of aneurysm, dissection, vasculitis or significant stenosis. Iliacs: Mild atherosclerotic changes are noted without aneurysmal dilatation or focal stenosis. Veins: No definitive venous abnormality is noted. Review of the MIP images confirms the above findings. NON-VASCULAR Hepatobiliary: No focal liver abnormality is seen. No gallstones, gallbladder wall thickening, or biliary dilatation. Pancreas: Unremarkable. No pancreatic ductal  dilatation or surrounding inflammatory changes. Spleen: Normal in size without focal abnormality. Adrenals/Urinary Tract: The  adrenal glands are within normal limits. The kidneys demonstrate a normal enhancement pattern bilaterally. No obstructive changes are seen. The bladder is partially distended. Stomach/Bowel: The appendix has been surgically removed. No obstructive or inflammatory changes are noted within the bowel. Lymphatic: No significant lymphadenopathy is identified. Reproductive: Uterus has been surgically removed. Other: No free fluid is identified. No free air is seen. In the anterior abdomen just beneath the right rectus muscle there is a somewhat globular calcification identified which is stable in appearance from 2016. Given the stability it is felt to be benign in etiology. Musculoskeletal: Mild degenerative changes of the lumbar spine are noted. No compression deformities are seen. Mild anterolisthesis of L4 on L5 is noted. This is stable from the prior study. Review of the MIP images confirms the above findings. IMPRESSION: CTA of the chest: No evidence of aortic dissection or aneurysmal dilatation. No evidence of pulmonary emboli. Large left thyroid nodule stable from previous exams. CTA of the abdomen and pelvis: No aortic abnormality is identified. Postsurgical changes. Calcification in the anterior abdomen as described consistent with a benign etiology given its long-term stability. Electronically Signed   By: Inez Catalina M.D.   On: 11/16/2017 12:49    Procedures Procedures (including critical care time)  Medications Ordered in ED Medications  morphine 4 MG/ML injection 4 mg (not administered)  sodium chloride 0.9 % bolus 1,000 mL (not administered)     Initial Impression / Assessment and Plan / ED Course  I have reviewed the triage vital signs and the nursing notes.  Pertinent labs & imaging results that were available during my care of the patient were reviewed by me and  considered in my medical decision making (see chart for details).  Clinical Course as of Nov 18 806  Thu Nov 16, 2017  1100 Comment 3:        [MB]  1142 Heart score of 4. Consulted cardiology. Did have 20 point difference in Bps. Awaiting CT Angio  [MB]  1158 Re-eval after pain medicine chest pain now 3 out of 10 looks more comfortable.  [MB]  5465 Patient was seen by cards. Awaiting recommendations.   [MB]  6812 Discussed with cardiology.  They are admitting her to their service and anticipate putting her on heparin and getting study tomorrow.  [MB]    Clinical Course User Index [MB] Hayden Rasmussen, MD    57 year old female with known coronary disease and stents here with acute onset substernal chest pain radiating to back shoulder neck with associated nausea.  Concerned this is an acute coronary syndrome although also concerned about aortic dissection with radiation to the back.  Her plain film x-ray was unremarkable she had symmetric pulses all around and no neuro symptoms.  We will proceed with cardiac workup serial troponins and pain control but also ordering a CTA to evaluate for dissection  Final Clinical Impressions(s) / ED Diagnoses   Final diagnoses:  Acute coronary syndrome [facility])    ED Discharge Orders    None       Hayden Rasmussen, MD 11/18/17 [phone]

## 2017-11-16 NOTE — ED Notes (Signed)
Patient denies pain and is resting comfortably.  

## 2017-11-17 ENCOUNTER — Observation Stay (HOSPITAL_BASED_OUTPATIENT_CLINIC_OR_DEPARTMENT_OTHER): Payer: Medicaid Other

## 2017-11-17 ENCOUNTER — Other Ambulatory Visit: Payer: Self-pay

## 2017-11-17 ENCOUNTER — Encounter (HOSPITAL_COMMUNITY): Payer: Self-pay | Admitting: *Deleted

## 2017-11-17 DIAGNOSIS — R079 Chest pain, unspecified: Secondary | ICD-10-CM | POA: Diagnosis not present

## 2017-11-17 LAB — NM MYOCAR MULTI W/SPECT W/WALL MOTION / EF
CHL CUP MPHR: 163 {beats}/min
CSEPHR: 71 %
CSEPPHR: 117 {beats}/min
Estimated workload: 1 METS
Exercise duration (min): 5 min
Rest HR: 56 {beats}/min

## 2017-11-17 LAB — BASIC METABOLIC PANEL
Anion gap: 8 (ref 5–15)
BUN: 15 mg/dL (ref 6–20)
CALCIUM: 9 mg/dL (ref 8.9–10.3)
CO2: 24 mmol/L (ref 22–32)
CREATININE: 1.1 mg/dL — AB (ref 0.44–1.00)
Chloride: 105 mmol/L (ref 101–111)
GFR, EST NON AFRICAN AMERICAN: 55 mL/min — AB (ref >60)
Glucose, Bld: 91 mg/dL (ref 65–99)
Potassium: 3.9 mmol/L (ref 3.5–5.1)
SODIUM: 137 mmol/L (ref 135–145)

## 2017-11-17 LAB — CBG MONITORING, ED: Glucose-Capillary: 77 mg/dL (ref 65–99)

## 2017-11-17 LAB — CBC
HCT: 43.2 % (ref 36.0–46.0)
Hemoglobin: 13.5 g/dL (ref 12.0–15.0)
MCH: 29.4 pg (ref 26.0–34.0)
MCHC: 31.3 g/dL (ref 30.0–36.0)
MCV: 94.1 fL (ref 78.0–100.0)
PLATELETS: 165 10*3/uL (ref 150–400)
RBC: 4.59 MIL/uL (ref 3.87–5.11)
RDW: 14.1 % (ref 11.5–15.5)
WBC: 6.1 10*3/uL (ref 4.0–10.5)

## 2017-11-17 LAB — TROPONIN I

## 2017-11-17 LAB — HIV ANTIBODY (ROUTINE TESTING W REFLEX): HIV Screen 4th Generation wRfx: NONREACTIVE

## 2017-11-17 LAB — HEPARIN LEVEL (UNFRACTIONATED): Heparin Unfractionated: 0.6 IU/mL (ref 0.30–0.70)

## 2017-11-17 MED ORDER — TECHNETIUM TC 99M TETROFOSMIN IV KIT
10.0000 | PACK | Freq: Once | INTRAVENOUS | Status: AC | PRN
  Administered 2017-11-17: 10 via INTRAVENOUS

## 2017-11-17 MED ORDER — REGADENOSON 0.4 MG/5ML IV SOLN
0.4000 mg | Freq: Once | INTRAVENOUS | Status: AC
  Administered 2017-11-17: 0.4 mg via INTRAVENOUS

## 2017-11-17 MED ORDER — REGADENOSON 0.4 MG/5ML IV SOLN
INTRAVENOUS | Status: AC
  Administered 2017-11-17: 0.4 mg via INTRAVENOUS
  Filled 2017-11-17: qty 5

## 2017-11-17 MED ORDER — TECHNETIUM TC 99M TETROFOSMIN IV KIT
30.0000 | PACK | Freq: Once | INTRAVENOUS | Status: AC | PRN
  Administered 2017-11-17: 30 via INTRAVENOUS

## 2017-11-17 MED ORDER — ACETAMINOPHEN 325 MG PO TABS: 650.0000 mg | ORAL_TABLET | ORAL | Status: DC | PRN

## 2017-11-17 NOTE — Progress Notes (Signed)
    Stress test results completed and have been reviewed by Dr. Percival Spanish and Dr. Sallyanne Kuster. Stress findings are felt to be low risk. I've discussed test results with Dr. Percival Spanish. Given low risk findings and atypical CP, he feels that the patient can be discharged home with plans to f/u with him in the office in several weeks. Pt and RN notified of results. Will d/c home.   Lyda Jester, PA-C 11/17/2017

## 2017-11-17 NOTE — Progress Notes (Signed)
ANTICOAGULATION CONSULT NOTE - Consult  Pharmacy Consult for heparin Indication: chest pain/ACS  No Known Allergies  Patient Measurements: Height: 5\' 6"  (167.6 cm) Weight: 175 lb (79.4 kg) IBW/kg (Calculated) : 59.3 Heparin Dosing Weight: 75.7 kg  Vital Signs: BP: 126/95 (01/10 2200) Pulse Rate: 66 (01/10 2230)  Labs: Recent Labs    11/16/17 0946 11/16/17 1813 11/16/17 2227  HGB 13.5  --   --   HCT 42.2  --   --   PLT 184  --   --   HEPARINUNFRC  --   --  0.69  CREATININE 1.23*  --   --   TROPONINI  --  <0.03  --     Estimated Creatinine Clearance: 53.6 mL/min (A) (by C-G formula based on SCr of 1.23 mg/dL (H)).   Medical History: Past Medical History:  Diagnosis Date  . Anemia   . CAD (coronary artery disease)    a. 06/2015 BMS x2 to LCx. b. recurrent CP, cath 09/18/2015 patent stent, tiny diag stenosis medical therapy.   . CHF (congestive heart failure) ([place])   . Diabetes mellitus without complication ([place])   . GERD (gastroesophageal reflux disease)    ?  Marland Kitchen Hyperlipidemia with target LDL less than 70   . Hypertension   . Shortness of breath dyspnea    with exertion  . STEMI (ST elevation myocardial infarction) ([place]) 06/16/2015   BMS x 2 CFX  . Thyroid nodule    benign   Assessment: 69 yof presenting with CP. Pharmacy consulted to dose heparin for ACS. Not on anticoagulation PTA. CBC wnl. No bleed documented. For Lexiscan stress later today per Cards.  Initial heparin level 0.69; suspect this level will decrease after bolus effect wears off; no overt bleeding documented  Goal of Therapy:  Heparin level 0.3-0.7 units/ml Monitor platelets by anticoagulation protocol: Yes   Plan:  Continue heparin at 900 units/h 6h heparin level Daily heparin level/CBC Monitor s/sx bleeding   Znya Albino L Garrin Kirwan 11/17/2017,12:34 AM

## 2017-11-17 NOTE — Progress Notes (Signed)
Progress Note  Patient Name: Virginia [name] Date of Encounter: 11/17/2017  Primary Cardiologist: No primary care provider on file.   Subjective   Chest pain present when she moved for exam and increased with palpation.  Inpatient Medications    Scheduled Meds: . aspirin EC  81 mg Oral Daily  . atorvastatin  40 mg Oral q1800  . ferrous sulfate  325 mg Oral Q breakfast  . isosorbide mononitrate  15 mg Oral Daily  . lisinopril  2.5 mg Oral Daily  . metoprolol tartrate  12.5 mg Oral BID  . vitamin B-12  1,000 mcg Oral Daily   Continuous Infusions: . heparin 900 Units/hr (11/16/17 1951)   PRN Meds: acetaminophen, nitroGLYCERIN, nitroGLYCERIN, ondansetron (ZOFRAN) IV   Vital Signs    Vitals:   11/17/17 0645 11/17/17 0700 11/17/17 0800 11/17/17 0900  BP:  136/85 (!) 144/86 136/81  Pulse: 66 77 67 71  Resp:      Temp:      SpO2: 97% 99% 99% 99%  Weight:      Height:        Intake/Output Summary (Last 24 hours) at 11/17/2017 1016 Last data filed at 11/16/2017 1239 Gross per 24 hour  Intake 999 ml  Output -  Net 999 ml   Filed Weights   11/16/17 0942  Weight: 175 lb (79.4 kg)    Telemetry    sinus - Personally Reviewed  ECG    No new tracings - Personally Reviewed  Physical Exam   GEN: No acute distress.   Neck: No JVD Cardiac: RRR, no murmurs, rubs, or gallops. Tender to palpation  Respiratory: Clear to auscultation bilaterally, but very distant sounds GI: Soft, nontender, non-distended  MS: No edema; No deformity. Neuro:  Nonfocal  Psych: Normal affect   Labs    Chemistry Recent Labs  Lab 11/16/17 0946 11/17/17 0515  NA 135 137  K 4.6 3.9  CL 102 105  CO2 25 24  GLUCOSE 108* 91  BUN 19 15  CREATININE 1.23* 1.10*  CALCIUM 9.1 9.0  GFRNONAA 48* 55*  GFRAA 55* >60  ANIONGAP 8 8     Hematology Recent Labs  Lab 11/16/17 0946 11/17/17 0515  WBC 6.0 6.1  RBC 4.56 4.59  HGB 13.5 13.5  HCT 42.2 43.2  MCV 92.5 94.1  MCH 29.6 29.4    MCHC 32.0 31.3  RDW 13.8 14.1  PLT 184 165    Cardiac Enzymes Recent Labs  Lab 11/16/17 1813 11/16/17 2227 11/17/17 0515  TROPONINI <0.03 <0.03 <0.03    Recent Labs  Lab 11/16/17 1000 11/16/17 1513  TROPIPOC 0.00 0.01     BNPNo results for input(s): BNP, PROBNP in the last 168 hours.   DDimer No results for input(s): DDIMER in the last 168 hours.   Radiology    Dg Chest 2 View  Result Date: 11/16/2017 CLINICAL DATA:  Left chest pain radiating into the back and left arm beginning this morning. EXAM: CHEST  2 VIEW COMPARISON:  PA and lateral chest 03/07/2017 and 09/17/2015. FINDINGS: The lungs are clear. Heart size is normal. There is no pneumothorax or pleural effusion. Mild convex left thoracolumbar scoliosis noted. IMPRESSION: No acute disease. Electronically Signed   By: Inge Rise M.D.   On: 11/16/2017 10:08   Ct Angio Chest/abd/pel For Dissection W And/or W/wo  Result Date: 11/16/2017 CLINICAL DATA:  Upper chest pain and shortness of breath for several hours EXAM: CT ANGIOGRAPHY CHEST, ABDOMEN AND PELVIS TECHNIQUE:  Multidetector CT imaging through the chest, abdomen and pelvis was performed using the standard protocol during bolus administration of intravenous contrast. Multiplanar reconstructed images and MIPs were obtained and reviewed to evaluate the vascular anatomy. CONTRAST:  100 mL Isovue 370. COMPARISON:  Chest x-ray from earlier in the same day. Ultrasound from 06/26/2015 FINDINGS: CTA CHEST FINDINGS Cardiovascular: Thoracic aorta is well visualize without evidence of aneurysmal dilatation or dissection. No significant atherosclerotic changes are identified. Only minimal coronary calcifications are seen. The pulmonary artery as visualized shows no evidence of pulmonary emboli. No cardiac enlargement is noted. Mediastinum/Nodes: Thoracic inlet demonstrates evidence of a large 3.5 cm nodule extending from the inferior aspect of the left lobe of the thyroid into the  superior mediastinum. This is stable in appearance from a prior ultrasound examination. No significant hilar or mediastinal adenopathy is noted. The esophagus is within normal limits. Lungs/Pleura: The lungs are well aerated bilaterally. Very minimal dependent atelectatic changes are seen. No focal infiltrate, effusion or pneumothorax is noted. No sizable parenchymal nodules are seen. Musculoskeletal: Degenerative changes of the thoracic spine are noted. No acute bony abnormality is seen. Review of the MIP images confirms the above findings. CTA ABDOMEN AND PELVIS FINDINGS VASCULAR Aorta: Mild atherosclerotic changes are noted without evidence of dissection or aneurysmal dilatation. Celiac: Patent without evidence of aneurysm, dissection, vasculitis or significant stenosis. SMA: Patent without evidence of aneurysm, dissection, vasculitis or significant stenosis. Renals: Single renal arteries are identified bilaterally. Mild eccentric plaque formation is noted in the proximal renal arteries right slightly greater than left. Very mild poststenotic dilatation is noted on the right. IMA: Patent without evidence of aneurysm, dissection, vasculitis or significant stenosis. Iliacs: Mild atherosclerotic changes are noted without aneurysmal dilatation or focal stenosis. Veins: No definitive venous abnormality is noted. Review of the MIP images confirms the above findings. NON-VASCULAR Hepatobiliary: No focal liver abnormality is seen. No gallstones, gallbladder wall thickening, or biliary dilatation. Pancreas: Unremarkable. No pancreatic ductal dilatation or surrounding inflammatory changes. Spleen: Normal in size without focal abnormality. Adrenals/Urinary Tract: The adrenal glands are within normal limits. The kidneys demonstrate a normal enhancement pattern bilaterally. No obstructive changes are seen. The bladder is partially distended. Stomach/Bowel: The appendix has been surgically removed. No obstructive or  inflammatory changes are noted within the bowel. Lymphatic: No significant lymphadenopathy is identified. Reproductive: Uterus has been surgically removed. Other: No free fluid is identified. No free air is seen. In the anterior abdomen just beneath the right rectus muscle there is a somewhat globular calcification identified which is stable in appearance from 2016. Given the stability it is felt to be benign in etiology. Musculoskeletal: Mild degenerative changes of the lumbar spine are noted. No compression deformities are seen. Mild anterolisthesis of L4 on L5 is noted. This is stable from the prior study. Review of the MIP images confirms the above findings. IMPRESSION: CTA of the chest: No evidence of aortic dissection or aneurysmal dilatation. No evidence of pulmonary emboli. Large left thyroid nodule stable from previous exams. CTA of the abdomen and pelvis: No aortic abnormality is identified. Postsurgical changes. Calcification in the anterior abdomen as described consistent with a benign etiology given its long-term stability. Electronically Signed   By: Inez Catalina M.D.   On: 11/16/2017 12:49    Cardiac Studies   Left heart cath 09/18/15:  Prox LAD lesion, 60% stenosed.  2nd Diag lesion, 95% stenosed.  The left ventricular systolic function is normal.  Mid LAD to Dist LAD lesion, 20% stenosed.  Normal LV function without residual wall motion abnormality with an ejection fraction of 55-65%.  Two-vessel CAD with 60% eccentric smooth tubular ostial LAD stenosis, 20% mid stenosis in the region of a very small bifurcating diagonal vessel with 95% stenosis in the inferior limb of the small diagonal branch; widely patent tandem bare-metal stents in the AV groove circumflex vessel; normal RCA.  RECOMMENDATION: Increased medical therapy.  Nitrates will be added to her regimen of beta blocker, ACE inhibitor and statin.  Consider amlodipine if recurrent symptomatology.  Patient Profile       57 y.o. female with a hx of CAD s/p STEMI with BMS x2 placement to LCx in 06/2015, recurrent chest pain with re-look cath 09/2015 with patent stents and 95% 2nd diag and 60% prox LAD with recommendations to tx medically, hx of GI bleed on Brilinta (2016), DM II, HLD, and HTN who is being seen for the evaluation of chest pain.  Assessment & Plan    1. Chest pain - chest pain returned when she sat up for my exam - also tender chest wall with palpation, suspect this is MSK pain - troponins remained negative - continue with plans for lexiscan myoview today - if low risk myoview, discharge later today with tylenol for MSK pain  2. HTN - continue home meds with the exception of lisinopril - pressures controlled with meds on board  3. Acute renal injury - sCr 1.10 (1.23) - hold metformin and ACEI today  4. DM - hold metformin today for AKI    For questions or updates, please contact [place] Please consult [email or web address] for contact info under Cardiology/STEMI.      Signed, Tami Lin Duke, PA  11/17/2017, 10:16 AM    History and all data above reviewed.  Patient examined.  I agree with the findings as above.  She continues to have intermittent chest pain that is reproducible with palpations.   The patient exam reveals COR:RRR  ,  Lungs: Clear  ,  Abd: Positive bowel sounds, no rebound no guarding, Ext No edema  .  All available labs, radiology testing, previous records reviewed. Agree with documented assessment and plan. Chest pain:  Atypical.  Lexiscan Myoview today and no further cardiac work up if negative.    Jeneen Rinks Rogue Rafalski  11:35 AM  11/17/2017

## 2017-11-17 NOTE — Progress Notes (Signed)
[place] for heparin Indication: chest pain/ACS  No Known Allergies  Patient Measurements: Height: 5\' 6"  (167.6 cm) Weight: 175 lb (79.4 kg) IBW/kg (Calculated) : 59.3 Heparin Dosing Weight: 75.7 kg  Vital Signs: BP: 136/81 (01/11 0900) Pulse Rate: 71 (01/11 0900)  Labs: Recent Labs    11/16/17 0946 11/16/17 1813 11/16/17 2227 11/17/17 0515  HGB 13.5  --   --  13.5  HCT 42.2  --   --  43.2  PLT 184  --   --  165  HEPARINUNFRC  --   --  0.69 0.60  CREATININE 1.23*  --   --  1.10*  TROPONINI  --  <0.03 <0.03 <0.03    Estimated Creatinine Clearance: 59.9 mL/min (A) (by C-G formula based on SCr of 1.1 mg/dL (H)).   Medical History: Past Medical History:  Diagnosis Date  . Anemia   . CAD (coronary artery disease)    a. 06/2015 BMS x2 to LCx. b. recurrent CP, cath 09/18/2015 patent stent, tiny diag stenosis medical therapy.   . CHF (congestive heart failure) ([place])   . Diabetes mellitus without complication ([place])   . GERD (gastroesophageal reflux disease)    ?  Marland Kitchen Hyperlipidemia with target LDL less than 70   . Hypertension   . Shortness of breath dyspnea    with exertion  . STEMI (ST elevation myocardial infarction) ([place]) 06/16/2015   BMS x 2 CFX  . Thyroid nodule    benign   Assessment: 61 yof presenting with CP. Pharmacy consulted to dose heparin for ACS. Not on anticoagulation PTA. CBC wnl. No bleed documented. Heparin level remains therapeutic at 0.6. No bleeding noted.   Goal of Therapy:  Heparin level 0.3-0.7 units/ml Monitor platelets by anticoagulation protocol: Yes   Plan:  Continue heparin at 900 units/h Daily heparin level and CBC   Virginia [name], Rande Lawman 11/17/2017,10:13 AM

## 2017-11-17 NOTE — Discharge Summary (Signed)
Discharge Summary    Patient ID: Virginia [name],  MRN: [id number], DOB/AGE: [id number] y.o.  Admit date: 11/16/2017 Discharge date: 11/17/2017  Primary Care Provider: Tresa Garter Primary Cardiologist: Larae Grooms, MD  Discharge Diagnoses    Principal Problem:   Chest pain Active Problems:   Diabetes mellitus type 2, controlled ([place])   CAD S/P CFX BMS (? med complinace) x 2 06/16/15   Acute renal insufficiency  Allergies No Known Allergies  Diagnostic Studies/Procedures    NST 11/17/17 FINDINGS: Perfusion: There is a small to moderate reversible perfusion defect involving the apex. No other reversible perfusion defects.  Wall Motion: Normal left ventricular wall motion. No left ventricular dilation.  Left Ventricular Ejection Fraction: 65 %  End diastolic volume 87 ml  End systolic volume 30 ml  IMPRESSION: 1. Small to moderate reversible perfusion defect at the left ventricular apex suspicious for myocardial ischemia.  2. Normal left ventricular wall motion.  3. Left ventricular ejection fraction 65%  4. Non invasive risk stratification*: Low to intermediate  Initial read by [facility] Radiology. Nuclear images were reviewed by [facility] cardiologist and attending cardiologist, who felt that study was low risk.    History of Present Illness     Virginia [name] is a 57 y.o. female with a hx of CAD s/p STEMI with BMS x2 placement to LCx in 06/2015, recurrent chest pain with re-look cath 09/2015 with patent stents and 95% 2nd diag and 60% prox LAD with recommendations to tx medically, hx of GI bleed on Brilinta (2016), DM II, HLD, and HTN who is being seen for the evaluation of chest pain  at the request of  Dr. Melina Copa.   Early morning 11/16/16, approximately 3am, she woke up from sleep to a dull, aching back pain (2/10) which radiated to her chest, left arm, and jaw. She was mildly nauseated at this time, took two baby ASA and went back to sleep  for a bit. The pain woke her once again a few hours later. She admits that the pain was constant in duration by this point and was gradually worsening. She ate a banana, took her morning medications, and then proceeded to take her daughter to work. By the time she dropped her daughter off, the pain was a 10/10 and she drove herself to [facility]. She admits to about 6 weeks of intermittent SOB and LE swelling that is unusual for her.   Due to her severe back pain, there was concern for aortic dissection despite bilateral, equal pulses and no neurological changes. A CT angio was performed that was negative for aneurysm, PE, or dissection. CXR showed no acute cardiopulmonary disease. EKG was unremarkable, NSR 73 not concerning for acute ischemia. Her initial troponin levels have been negative, 0.0. creatinine is slightly elevated at 1.23.   Hospital Course     Pt admitted for CP with atypical > typical features. Cardiac enzymes were cycled and negative x 3. On further evaluation, she had CP that was reproducible with palpation of chest wall, c/w with musculoskeletal pain. However given her cardiac history, she underwent a NST on 11/17/17. Initial read by [facility] Radiology was felt to be low to moderate risk with small to moderate reversible perfusion defect at the left ventricular apex. . However, nuclear images were reviewed by [facility] cardiologist and attending cardiologist, who felt that study was low risk. Test results were reviewed by Dr. Percival Spanish. Given low risk findings and atypical CP, he feels that the  patient can be discharged home with plans to f/u in the office in several weeks. PRN tylenol recommended for musculoskeletal pain. Pt and RN notified of results. Pt was discharged home in stable condition. Post hospital f/u will be arranged w/ Dr. Irish Lack.   Consultants: none    Discharge Vitals Blood pressure (!) 146/67, pulse 73, temperature 97.9 F (36.6 C), temperature source Oral, resp. rate 16,  height 5\' 6"  (1.676 m), weight 175 lb (79.4 kg), last menstrual period 12/29/2015, SpO2 99 %.  Filed Weights   11/16/17 0942  Weight: 175 lb (79.4 kg)    Labs & Radiologic Studies    CBC Recent Labs    11/16/17 0946 11/17/17 0515  WBC 6.0 6.1  HGB 13.5 13.5  HCT 42.2 43.2  MCV 92.5 94.1  PLT 184 409   Basic Metabolic Panel Recent Labs    11/16/17 0946 11/17/17 0515  NA 135 137  K 4.6 3.9  CL 102 105  CO2 25 24  GLUCOSE 108* 91  BUN 19 15  CREATININE 1.23* 1.10*  CALCIUM 9.1 9.0   Liver Function Tests No results for input(s): AST, ALT, ALKPHOS, BILITOT, PROT, ALBUMIN in the last 72 hours. No results for input(s): LIPASE, AMYLASE in the last 72 hours. Cardiac Enzymes Recent Labs    11/16/17 1813 11/16/17 2227 11/17/17 0515  TROPONINI <0.03 <0.03 <0.03   BNP Invalid input(s): POCBNP D-Dimer No results for input(s): DDIMER in the last 72 hours. Hemoglobin A1C No results for input(s): HGBA1C in the last 72 hours. Fasting Lipid Panel No results for input(s): CHOL, HDL, LDLCALC, TRIG, CHOLHDL, LDLDIRECT in the last 72 hours. Thyroid Function Tests No results for input(s): TSH, T4TOTAL, T3FREE, THYROIDAB in the last 72 hours.  Invalid input(s): FREET3 _____________  Dg Chest 2 View  Result Date: 11/16/2017 CLINICAL DATA:  Left chest pain radiating into the back and left arm beginning this morning. EXAM: CHEST  2 VIEW COMPARISON:  PA and lateral chest 03/07/2017 and 09/17/2015. FINDINGS: The lungs are clear. Heart size is normal. There is no pneumothorax or pleural effusion. Mild convex left thoracolumbar scoliosis noted. IMPRESSION: No acute disease. Electronically Signed   By: Inge Rise M.D.   On: 11/16/2017 10:08   Nm Myocar Multi W/spect W/wall Motion / Ef  Result Date: 11/17/2017 CLINICAL DATA:  History of myocardial infarction, diabetes, hypertension. Shortness of breath and congestive heart failure. EXAM: MYOCARDIAL IMAGING WITH SPECT (REST AND  PHARMACOLOGIC-STRESS) GATED LEFT VENTRICULAR WALL MOTION STUDY LEFT VENTRICULAR EJECTION FRACTION TECHNIQUE: Standard myocardial SPECT imaging was performed after resting intravenous injection of 10 mCi Tc-55m tetrofosmin. Subsequently, intravenous infusion of Lexiscan was performed under the supervision of the Cardiology staff. At peak effect of the drug, 30 mCi Tc-60m tetrofosmin was injected intravenously and standard myocardial SPECT imaging was performed. Quantitative gated imaging was also performed to evaluate left ventricular wall motion, and estimate left ventricular ejection fraction. COMPARISON:  Chest CTA 11/16/2017. FINDINGS: Perfusion: There is a small to moderate reversible perfusion defect involving the apex. No other reversible perfusion defects. Wall Motion: Normal left ventricular wall motion. No left ventricular dilation. Left Ventricular Ejection Fraction: 65 % End diastolic volume 87 ml End systolic volume 30 ml IMPRESSION: 1. Small to moderate reversible perfusion defect at the left ventricular apex suspicious for myocardial ischemia. 2. Normal left ventricular wall motion. 3. Left ventricular ejection fraction 65% 4. Non invasive risk stratification*: Low to intermediate *2012 Appropriate Use Criteria for Coronary Revascularization Focused Update: J Am Coll Cardiol.  7564;33(2):951-884. http://content.airportbarriers.com.aspx?articleid=[email or web address] Electronically Signed   By: Richardean Sale M.D.   On: 11/17/2017 16:56   Ct Angio Chest/abd/pel For Dissection W And/or W/wo  Result Date: 11/16/2017 CLINICAL DATA:  Upper chest pain and shortness of breath for several hours EXAM: CT ANGIOGRAPHY CHEST, ABDOMEN AND PELVIS TECHNIQUE: Multidetector CT imaging through the chest, abdomen and pelvis was performed using the standard protocol during bolus administration of intravenous contrast. Multiplanar reconstructed images and MIPs were obtained and reviewed to evaluate the vascular anatomy.  CONTRAST:  100 mL Isovue 370. COMPARISON:  Chest x-ray from earlier in the same day. Ultrasound from 06/26/2015 FINDINGS: CTA CHEST FINDINGS Cardiovascular: Thoracic aorta is well visualize without evidence of aneurysmal dilatation or dissection. No significant atherosclerotic changes are identified. Only minimal coronary calcifications are seen. The pulmonary artery as visualized shows no evidence of pulmonary emboli. No cardiac enlargement is noted. Mediastinum/Nodes: Thoracic inlet demonstrates evidence of a large 3.5 cm nodule extending from the inferior aspect of the left lobe of the thyroid into the superior mediastinum. This is stable in appearance from a prior ultrasound examination. No significant hilar or mediastinal adenopathy is noted. The esophagus is within normal limits. Lungs/Pleura: The lungs are well aerated bilaterally. Very minimal dependent atelectatic changes are seen. No focal infiltrate, effusion or pneumothorax is noted. No sizable parenchymal nodules are seen. Musculoskeletal: Degenerative changes of the thoracic spine are noted. No acute bony abnormality is seen. Review of the MIP images confirms the above findings. CTA ABDOMEN AND PELVIS FINDINGS VASCULAR Aorta: Mild atherosclerotic changes are noted without evidence of dissection or aneurysmal dilatation. Celiac: Patent without evidence of aneurysm, dissection, vasculitis or significant stenosis. SMA: Patent without evidence of aneurysm, dissection, vasculitis or significant stenosis. Renals: Single renal arteries are identified bilaterally. Mild eccentric plaque formation is noted in the proximal renal arteries right slightly greater than left. Very mild poststenotic dilatation is noted on the right. IMA: Patent without evidence of aneurysm, dissection, vasculitis or significant stenosis. Iliacs: Mild atherosclerotic changes are noted without aneurysmal dilatation or focal stenosis. Veins: No definitive venous abnormality is noted.  Review of the MIP images confirms the above findings. NON-VASCULAR Hepatobiliary: No focal liver abnormality is seen. No gallstones, gallbladder wall thickening, or biliary dilatation. Pancreas: Unremarkable. No pancreatic ductal dilatation or surrounding inflammatory changes. Spleen: Normal in size without focal abnormality. Adrenals/Urinary Tract: The adrenal glands are within normal limits. The kidneys demonstrate a normal enhancement pattern bilaterally. No obstructive changes are seen. The bladder is partially distended. Stomach/Bowel: The appendix has been surgically removed. No obstructive or inflammatory changes are noted within the bowel. Lymphatic: No significant lymphadenopathy is identified. Reproductive: Uterus has been surgically removed. Other: No free fluid is identified. No free air is seen. In the anterior abdomen just beneath the right rectus muscle there is a somewhat globular calcification identified which is stable in appearance from 2016. Given the stability it is felt to be benign in etiology. Musculoskeletal: Mild degenerative changes of the lumbar spine are noted. No compression deformities are seen. Mild anterolisthesis of L4 on L5 is noted. This is stable from the prior study. Review of the MIP images confirms the above findings. IMPRESSION: CTA of the chest: No evidence of aortic dissection or aneurysmal dilatation. No evidence of pulmonary emboli. Large left thyroid nodule stable from previous exams. CTA of the abdomen and pelvis: No aortic abnormality is identified. Postsurgical changes. Calcification in the anterior abdomen as described consistent with a benign etiology given its long-term stability. Electronically Signed  By: Inez Catalina M.D.   On: 11/16/2017 12:49   Disposition   Pt is being discharged home today in good condition.  Follow-up Plans & Appointments    Follow-up Information    Jettie Booze, MD Follow up.   Specialties:  Cardiology, Radiology,  Interventional Cardiology Why:  our office will call you with hospital follow-up visit.  Contact information: [address] [address] [phone]          Discharge Instructions    Diet - low sodium heart healthy   Complete by:  As directed    Increase activity slowly   Complete by:  As directed       Discharge Medications   Allergies as of 11/17/2017   No Known Allergies     Medication List    TAKE these medications   acetaminophen 325 MG tablet Commonly known as:  TYLENOL Take 2 tablets (650 mg total) by mouth every 4 (four) hours as needed for mild pain.   aspirin EC 81 MG tablet Take 81 mg by mouth daily.   atorvastatin 40 MG tablet Commonly known as:  LIPITOR TAKE 1 TABLET BY MOUTH DAILY AT 6 PM   estradiol 1 MG tablet Commonly known as:  ESTRACE Take 1 tablet (1 mg total) by mouth daily.   FEROSUL 325 (65 FE) MG tablet Generic drug:  ferrous sulfate TAKE 1 TABLET BY MOUTH DAILY WITH BREAKFAST.   gabapentin 300 MG capsule Commonly known as:  NEURONTIN Take 1 capsule (300 mg total) by mouth at bedtime.   isosorbide mononitrate 30 MG 24 hr tablet Commonly known as:  IMDUR Take 0.5 tablets (15 mg total) by mouth daily.   lisinopril 2.5 MG tablet Commonly known as:  PRINIVIL,ZESTRIL TAKE 1 TABLET BY MOUTH DAILY.   metFORMIN 500 MG tablet Commonly known as:  GLUCOPHAGE Take 1 tablet (500 mg total) by mouth daily with breakfast.   metoprolol tartrate 25 MG tablet Commonly known as:  LOPRESSOR Take 0.5 tablets (12.5 mg total) by mouth 2 (two) times daily.   nitroGLYCERIN 0.4 MG SL tablet Commonly known as:  NITROSTAT Place 1 tablet (0.4 mg total) under the tongue every 5 (five) minutes as needed for chest pain.   omeprazole 20 MG capsule Commonly known as:  PRILOSEC TAKE 1 CAPSULE BY MOUTH DAILY.   vitamin B-12 1000 MCG tablet Commonly known as:  CYANOCOBALAMIN Take 1 tablet (1,000 mcg total) by mouth daily.           Outstanding Labs/Studies   None   Duration of Discharge Encounter   Greater than 30 minutes including physician time.  Signed, Lyda Jester PA-C 11/17/2017, 6:00 PM

## 2017-11-17 NOTE — Progress Notes (Signed)
Discharge instructions reviewed with pt. Pt has no questions at this time. Pt denies any pain and states she is ready for discharge.

## 2017-11-17 NOTE — ED Notes (Signed)
Patient denies pain and is resting comfortably.  

## 2017-12-05 MED FILL — ISOSORBIDE MN ER 30 MG TAB: 30 | 30 days supply | Qty: 15 | Fill #8

## 2017-12-05 MED FILL — OMEPRAZOLE DR 20 MG CAPSULE: 20 | 30 days supply | Qty: 30 | Fill #2

## 2017-12-14 ENCOUNTER — Other Ambulatory Visit: Payer: Medicaid Other

## 2017-12-14 DIAGNOSIS — E785 Hyperlipidemia, unspecified: Secondary | ICD-10-CM

## 2017-12-14 NOTE — Addendum Note (Signed)
Addended by: Lollie Marrow on: 12/14/2017 08:07 AM   Modules accepted: Orders

## 2017-12-15 LAB — LIPID PANEL
CHOL/HDL RATIO: 2.4 ratio (ref 0.0–4.4)
Cholesterol, Total: 141 mg/dL (ref 100–199)
HDL: 60 mg/dL (ref >39)
LDL CALC: 72 mg/dL (ref 0–99)
Triglycerides: 47 mg/dL (ref 0–149)
VLDL CHOLESTEROL CAL: 9 mg/dL (ref 5–40)

## 2017-12-15 MED FILL — metFORMIN HCL 500 MG TABS: 500 | 30 days supply | Qty: 30 | Fill #3

## 2017-12-17 ENCOUNTER — Other Ambulatory Visit: Payer: Self-pay | Admitting: Family Medicine

## 2017-12-17 DIAGNOSIS — E785 Hyperlipidemia, unspecified: Secondary | ICD-10-CM

## 2017-12-17 MED ORDER — ATORVASTATIN CALCIUM 20 MG PO TABS: ORAL_TABLET | ORAL | 1 refills | Status: DC

## 2017-12-17 NOTE — Progress Notes (Signed)
Meds ordered this encounter  Medications  . atorvastatin (LIPITOR) 20 MG tablet    Sig: TAKE 1 TABLET BY MOUTH DAILY AT 6 PM    Dispense:  90 tablet    Refill:  1    Must have office visit for refills    Donia Pounds  MSN, FNP-C [facility] [address] [place], [place] [address] [phone]

## 2017-12-18 ENCOUNTER — Telehealth: Payer: Self-pay

## 2017-12-18 NOTE — Telephone Encounter (Signed)
-----   Message from Dorena Dew, [place] sent at 12/17/2017  8:00 AM EST ----- Regarding: lab results Please inform patient that cholesterol levels are within a normal range. Will decrease atorvastatin to 20 mg every evening with dinner. Recommend a lowfat, low carbohydrate diet divided over 5-6 small meals, increase water intake to 6-8 glasses, and 150 minutes per week of cardiovascular exercise.   Thanks

## 2017-12-18 NOTE — Telephone Encounter (Signed)
Called, daughter answered phone and said patient was not home. She took a message for patient to call back when available. Thanks!

## 2017-12-19 NOTE — Telephone Encounter (Signed)
Called and spoke with patient. Advised that cholesterol had decreased to a normal range and we will decrease atorvastatin to 20mg  once daily with dinner. Recommended that she eat a low fat/ low carb diet over 5 to 6 small meals daily. Asked that she drink 6 to 8 glasses of water daily and exercise 150 minutes weekly of cardiovascular exercise. Patient verbalized understanding and was asked to keep next scheduled appointment. Thanks!

## 2017-12-25 ENCOUNTER — Other Ambulatory Visit: Payer: Self-pay

## 2017-12-25 DIAGNOSIS — I10 Essential (primary) hypertension: Secondary | ICD-10-CM

## 2017-12-25 MED ORDER — METOPROLOL TARTRATE 25 MG PO TABS: 12.5000 mg | ORAL_TABLET | Freq: Two times a day (BID) | ORAL | 5 refills | Status: DC

## 2017-12-25 MED FILL — METOPROLOL TARTRATE 25 MG T: 25 | 30 days supply | Qty: 30 | Fill #0

## 2017-12-25 NOTE — Progress Notes (Signed)
Cardiology Office Note   Date:  12/26/2017   ID:  Virginia [name], DOB [birth year], MRN [id number]  PCP:  Tresa Garter, MD    No chief complaint on file.  CAD/Old MI  Wt Readings from Last 3 Encounters:  12/26/17 183 lb 12.8 oz (83.4 kg)  11/16/17 175 lb (79.4 kg)  09/14/17 177 lb (80.3 kg)       History of Present Illness: Virginia [name] is a 57 y.o. female   Who had a lateral MI in August 2016.  She was treated with 2 overlappig BMS in 2016.  She was cathed again a few months later shwing:  Prox LAD lesion, 60% stenosed.  2nd Diag lesion, 95% stenosed.  The left ventricular systolic function is normal.  Mid LAD to Dist LAD lesion, 20% stenosed.   Normal LV function without residual wall motion abnormality with an ejection fraction of 55-65%.  Two-vessel CAD with 60% eccentric smooth tubular ostial LAD stenosis, 20% mid stenosis in the region of a very small bifurcating diagonal vessel with 95% stenosis in the inferior limb of the small diagonal branch; widely patent tandem bare-metal stents in the AV groove circumflex vessel; normal RCA.  RECOMMENDATION: Increased medical therapy.  Nitrates will be added to her regimen of beta blocker, ACE inhibitor and statin.  Consider amlodipine if recurrent symptomatology.  She had some vaginal bleeding post MI.   In 2019, she has had more chest pain.  It was atypical.  She had a low risk nuclear study in the hospital.  THere was a question of apical ischemia.  Nothing noted in the lateral wall where she had her MI.  She walks for exercise in the warmer weather.  She has not been walking in the colder weather.  SHe did recently get a treadmill and finally got it set up.  She plans to start on this.  No ischemia in the mid anterior wall past the moderate proximal LAD lesion noted in the past.    THe pain she has been having is different than what she had with her prior MI.    Denies : Dizziness. Leg edema. Nitroglycerin  use. Orthopnea. Palpitations. Paroxysmal nocturnal dyspnea. Shortness of breath. Syncope.     Past Medical History:  Diagnosis Date  . Anemia   . CAD (coronary artery disease)    a. 06/2015 BMS x2 to LCx. b. recurrent CP, cath 09/18/2015 patent stent, tiny diag stenosis medical therapy.   . CHF (congestive heart failure) ([place])   . Diabetes mellitus without complication ([place])   . GERD (gastroesophageal reflux disease)    ?  Marland Kitchen Hyperlipidemia with target LDL less than 70   . Hypertension   . Shortness of breath dyspnea    with exertion  . STEMI (ST elevation myocardial infarction) ([place]) 06/16/2015   BMS x 2 CFX  . Thyroid nodule    benign    Past Surgical History:  Procedure Laterality Date  . ABDOMINAL HYSTERECTOMY N/A 02/02/2016   Procedure: HYSTERECTOMY ABDOMINAL WITH BILATERAL SALPINGECTOMY;  Surgeon: Woodroe Mode, MD;  Location: [facility];  Service: Gynecology;  Laterality: N/A;  . APPENDECTOMY    . CARDIAC CATHETERIZATION N/A 06/17/2015   Procedure: Left Heart Cath and Coronary Angiography;  Surgeon: Jettie Booze, MD;  Location: [facility];  Service: Cardiovascular;  Laterality: N/A;  . CARDIAC CATHETERIZATION  06/17/2015   Procedure: Coronary Stent Intervention;  Surgeon: Jettie Booze, MD;  Location: [facility];  Service: Cardiovascular;;  . CARDIAC CATHETERIZATION N/A 09/18/2015   Procedure: Left Heart Cath and Coronary Angiography;  Surgeon: Troy Sine, MD;  Location: [facility];  Service: Cardiovascular;  Laterality: N/A;  . CESAREAN SECTION     Myomectomy done during cesarean section  . COLONOSCOPY N/A 09/20/2015   Procedure: COLONOSCOPY;  Surgeon: Carol Ada, MD;  Location: [facility];  Service: Endoscopy;  Laterality: N/A;  . COLONOSCOPY    . ESOPHAGOGASTRODUODENOSCOPY N/A 09/20/2015   Procedure: ESOPHAGOGASTRODUODENOSCOPY (EGD);  Surgeon: Carol Ada, MD;  Location: [facility];  Service: Endoscopy;  Laterality: N/A;  .  olympus  quick clip pro gastric body  10/09/15     Current Outpatient Medications  Medication Sig Dispense Refill  . acetaminophen (TYLENOL) 325 MG tablet Take 2 tablets (650 mg total) by mouth every 4 (four) hours as needed for mild pain.    Marland Kitchen aspirin EC 81 MG tablet Take 81 mg by mouth daily.    Marland Kitchen atorvastatin (LIPITOR) 20 MG tablet TAKE 1 TABLET BY MOUTH DAILY AT 6 PM 90 tablet 1  . estradiol (ESTRACE) 1 MG tablet Take 1 tablet (1 mg total) by mouth daily. 30 tablet 6  . FEROSUL 325 (65 Fe) MG tablet TAKE 1 TABLET BY MOUTH DAILY WITH BREAKFAST. 30 tablet 5  . gabapentin (NEURONTIN) 300 MG capsule Take 1 capsule (300 mg total) by mouth at bedtime. 30 capsule 2  . isosorbide mononitrate (IMDUR) 30 MG 24 hr tablet Take 0.5 tablets (15 mg total) by mouth daily. 30 tablet 5  . lisinopril (PRINIVIL,ZESTRIL) 2.5 MG tablet TAKE 1 TABLET BY MOUTH DAILY. 90 tablet 1  . metFORMIN (GLUCOPHAGE) 500 MG tablet Take 1 tablet (500 mg total) by mouth daily with breakfast. 90 tablet 3  . metoprolol tartrate (LOPRESSOR) 25 MG tablet Take 0.5 tablets (12.5 mg total) by mouth 2 (two) times daily. 60 tablet 5  . nitroGLYCERIN (NITROSTAT) 0.4 MG SL tablet Place 1 tablet (0.4 mg total) under the tongue every 5 (five) minutes as needed for chest pain. 25 tablet 2  . omeprazole (PRILOSEC) 20 MG capsule TAKE 1 CAPSULE BY MOUTH DAILY. 30 capsule 5  . vitamin B-12 (CYANOCOBALAMIN) 1000 MCG tablet Take 1 tablet (1,000 mcg total) by mouth daily. 90 tablet 3   No current facility-administered medications for this visit.     Allergies:   Patient has no known allergies.    Social History:  The patient  reports that  has never smoked. she has never used smokeless tobacco. She reports that she does not drink alcohol or use drugs.   Family History:  The patient's family history includes Asthma in her paternal uncle; Cancer in her father; Diabetes in her mother, paternal aunt, and paternal uncle; Heart attack in her brother  and maternal uncle; Heart disease in her mother; Hyperlipidemia in her sister; Hypertension in her brother and sister; Stroke in her mother.    ROS:  Please see the history of present illness.   Otherwise, review of systems are positive for persistent chest pain.   All other systems are reviewed and negative.    PHYSICAL EXAM: VS:  BP 130/60   Pulse 68   Ht 5\' 6"  (1.676 m)   Wt 183 lb 12.8 oz (83.4 kg)   LMP 12/29/2015   SpO2 98%   BMI 29.67 kg/m  , BMI Body mass index is 29.67 kg/m. GEN: Well nourished, well developed, in no acute distress  HEENT: normal  Neck:  no JVD, carotid bruits, or masses Cardiac: RRR; no murmurs, rubs, or gallops,no edema  Respiratory:  clear to auscultation bilaterally, normal work of breathing GI: soft, nontender, nondistended, + BS MS: no deformity or atrophy  Skin: warm and dry, no rash Neuro:  Strength and sensation are intact Psych: euthymic mood, full affect   EKG:   The ekg ordered today demonstrates NSR, no ST changes   Recent Labs: 11/17/2017: BUN 15; Creatinine, Ser 1.10; Hemoglobin 13.5; Platelets 165; Potassium 3.9; Sodium 137   Lipid Panel    Component Value Date/Time   CHOL 141 12/14/2017 0853   TRIG 47 12/14/2017 0853   HDL 60 12/14/2017 0853   CHOLHDL 2.4 12/14/2017 0853   CHOLHDL 2.4 12/14/2016 1111   VLDL 15 12/14/2016 1111   LDLCALC 72 12/14/2017 0853     Other studies Reviewed: Additional studies/ records that were reviewed today with results demonstrating: hospital records reviewed including negative troponins.  I also looked back at her ECGs from the time of her MI in 2016.  Clearly had ST segment changes with her circumflex lesion.   ASSESSMENT AND PLAN:  1. CAD/Old MI: She has had some atypical chest discomfort.  Low risk stress test in January 2019.  Her discomfort is not exertional.  She is going to get back into regular exercise.  She has not had symptoms like she had with her prior MI.  If she has symptoms like  her prior MI, she will let us know.   2. Hyperlipidemia: LDL was 72 on 40 mg atorvastatin.  Would not decrease from this dose since she has h/o MI, LDL target 70.  Increase atorvastatin back to 40 mg daily.  3. DM: A1C 6.0 in 11/18. 4. HTN: Controlled. Continue current meds.    Current medicines are reviewed at length with the patient today.  The patient concerns regarding her medicines were addressed.  The following changes have been made:  Increase atrovastatin  Labs/ tests ordered today include:  No orders of the defined types were placed in this encounter.   Recommend 150 minutes/week of aerobic exercise Low fat, low carb, high fiber diet recommended  Disposition:   FU in 6 months   Signed, Larae Grooms, MD  12/26/2017 10:38 AM    [facility] [place], [place], [place]  [address] Phone: [phone]; Fax: [phone]

## 2017-12-26 ENCOUNTER — Encounter (INDEPENDENT_AMBULATORY_CARE_PROVIDER_SITE_OTHER): Payer: Self-pay

## 2017-12-26 ENCOUNTER — Ambulatory Visit: Payer: Medicaid Other | Admitting: Interventional Cardiology

## 2017-12-26 ENCOUNTER — Encounter: Payer: Self-pay | Admitting: Interventional Cardiology

## 2017-12-26 VITALS — BP 130/60 | HR 68 | Ht 66.0 in | Wt 183.8 lb

## 2017-12-26 DIAGNOSIS — I10 Essential (primary) hypertension: Secondary | ICD-10-CM | POA: Diagnosis not present

## 2017-12-26 DIAGNOSIS — E785 Hyperlipidemia, unspecified: Secondary | ICD-10-CM | POA: Diagnosis not present

## 2017-12-26 DIAGNOSIS — I2510 Atherosclerotic heart disease of native coronary artery without angina pectoris: Secondary | ICD-10-CM | POA: Insufficient documentation

## 2017-12-26 DIAGNOSIS — I252 Old myocardial infarction: Secondary | ICD-10-CM | POA: Diagnosis not present

## 2017-12-26 DIAGNOSIS — I25118 Atherosclerotic heart disease of native coronary artery with other forms of angina pectoris: Secondary | ICD-10-CM | POA: Diagnosis not present

## 2017-12-26 MED ORDER — METOPROLOL TARTRATE 25 MG PO TABS: 12.5000 mg | ORAL_TABLET | Freq: Two times a day (BID) | ORAL | 3 refills | Status: DC

## 2017-12-26 MED ORDER — ATORVASTATIN CALCIUM 40 MG PO TABS: 40.0000 mg | ORAL_TABLET | Freq: Every day | ORAL | 3 refills | Status: DC

## 2017-12-26 MED FILL — ATORVASTATIN 40 MG TABLET: 40 | 30 days supply | Qty: 30 | Fill #0

## 2017-12-26 NOTE — Patient Instructions (Signed)
Medication Instructions:  Your physician has recommended you make the following change in your medication:   INCREASE: atorvastatin to 40 mg daily  Labwork: None ordered  Testing/Procedures: None ordered  Follow-Up: Your physician wants you to follow-up in: 6 months with Dr. Irish Lack. You will receive a reminder letter in the mail two months in advance. If you don't receive a letter, please call our office to schedule the follow-up appointment.   Any Other Special Instructions Will Be Listed Below (If Applicable).     If you need a refill on your cardiac medications before your next appointment, please call your pharmacy.

## 2018-01-10 ENCOUNTER — Other Ambulatory Visit: Payer: Self-pay | Admitting: Family Medicine

## 2018-01-10 DIAGNOSIS — N951 Menopausal and female climacteric states: Secondary | ICD-10-CM

## 2018-01-10 DIAGNOSIS — G629 Polyneuropathy, unspecified: Secondary | ICD-10-CM

## 2018-01-10 MED FILL — GABAPENTIN 300 MG CAPSULE: 300 | 30 days supply | Qty: 30 | Fill #0

## 2018-01-10 MED FILL — OMEPRAZOLE DR 20 MG CAPSULE: 20 | 30 days supply | Qty: 30 | Fill #3

## 2018-01-10 MED FILL — ISOSORBIDE MN ER 30 MG TAB: 30 | 30 days supply | Qty: 15 | Fill #9

## 2018-01-10 MED FILL — ESTRADIOL 1 MG TABLET: 1 | 30 days supply | Qty: 30 | Fill #0

## 2018-01-24 MED FILL — LISINOPRIL 2.5 MG TABLET: 2.5 | 10 days supply | Qty: 30 | Fill #3

## 2018-01-24 MED FILL — metFORMIN HCL 500 MG TABS: 500 | 30 days supply | Qty: 30 | Fill #4

## 2018-01-31 MED FILL — METOPROLOL TARTRATE 25 MG T: 25 | 30 days supply | Qty: 30 | Fill #1

## 2018-02-20 MED FILL — ISOSORBIDE MN ER 30 MG TAB: 30 | 30 days supply | Qty: 15 | Fill #10

## 2018-02-20 MED FILL — OMEPRAZOLE DR 20 MG CAPSULE: 20 | 30 days supply | Qty: 30 | Fill #4

## 2018-03-05 ENCOUNTER — Other Ambulatory Visit: Payer: Self-pay | Admitting: Family Medicine

## 2018-03-05 DIAGNOSIS — E119 Type 2 diabetes mellitus without complications: Secondary | ICD-10-CM

## 2018-03-05 DIAGNOSIS — I10 Essential (primary) hypertension: Secondary | ICD-10-CM

## 2018-03-05 MED FILL — ATORVASTATIN CALCIUM 40 MG: 40 | 30 days supply | Qty: 30 | Fill #1

## 2018-03-05 MED FILL — ESTRADIOL 1 MG TABLET: 1 | 30 days supply | Qty: 30 | Fill #1

## 2018-03-05 MED FILL — metFORMIN HCL 500 MG TABS: 500 | 30 days supply | Qty: 30 | Fill #5

## 2018-03-05 MED FILL — METOPROLOL TARTRATE 25 MG T: 25 | 30 days supply | Qty: 30 | Fill #2

## 2018-03-06 MED FILL — LISINOPRIL 2.5 MG TABLET: 2.5 | 30 days supply | Qty: 30 | Fill #0

## 2018-03-14 ENCOUNTER — Encounter: Payer: Self-pay | Admitting: Family Medicine

## 2018-03-14 ENCOUNTER — Ambulatory Visit (INDEPENDENT_AMBULATORY_CARE_PROVIDER_SITE_OTHER): Payer: Medicaid Other | Admitting: Family Medicine

## 2018-03-14 VITALS — BP 132/70 | HR 70 | Temp 98.5°F | Resp 16 | Ht 66.0 in | Wt 189.0 lb

## 2018-03-14 DIAGNOSIS — E119 Type 2 diabetes mellitus without complications: Secondary | ICD-10-CM

## 2018-03-14 DIAGNOSIS — I10 Essential (primary) hypertension: Secondary | ICD-10-CM

## 2018-03-14 DIAGNOSIS — E785 Hyperlipidemia, unspecified: Secondary | ICD-10-CM | POA: Diagnosis not present

## 2018-03-14 LAB — POCT URINALYSIS DIPSTICK
Bilirubin, UA: NEGATIVE
GLUCOSE UA: NEGATIVE
Ketones, UA: NEGATIVE
Leukocytes, UA: NEGATIVE
Nitrite, UA: NEGATIVE
Protein, UA: NEGATIVE
Spec Grav, UA: 1.015 (ref 1.010–1.025)
UROBILINOGEN UA: 0.2 U/dL
pH, UA: 7 (ref 5.0–8.0)

## 2018-03-14 LAB — POCT GLYCOSYLATED HEMOGLOBIN (HGB A1C): HEMOGLOBIN A1C: 5.9

## 2018-03-14 NOTE — Patient Instructions (Signed)
Your hemoglobin A1c is 5.9, which is at goal.  We will continue metformin 500 mg daily.  Your blood pressure is also at goal, no changes warranted on today.- Continue medication, monitor blood pressure at home. Continue DASH diet. Reminder to go to the ER if any CP, SOB, nausea, dizziness, severe HA, changes vision/speech, left arm numbness and tingling and jaw pain.     Your A1C goal is less than 7. Your fasting blood sugar  Upon awakening goal is between 110-140.  Your LDL  (bad cholesterol goal is less than 100) Blood pressure goal is <140/90.  Recommend a lowfat, low carbohydrate diet divided over 5-6 small meals, increase water intake to 6-8 glasses, and 150 minutes per week of cardiovascular exercise.   Take your medications as prescribed Make sure that you are familiar with each one of your medications and what they are used to treat.  If you are unsure of medications, please bring to follow up Will send referral for eye exam  Please keep your scheduled follow up appointment.

## 2018-03-14 NOTE — Progress Notes (Signed)
Subjective:    Patient ID: Virginia [name], female    DOB: [birth year], 57 y.o.   MRN: [id number]  Ms. Ecko Beasley, a 57 year female with a history of hypertension, diabetes, and hyperlipidemia presents for a follow up of chronic conditions. Ms. Haag states that she feels well overall and is without complaint.  Ms. Portillo has been taking all medications consistently and continues to follow a carbohydrate modified low-fat diet.  She also exercises routinely.  She denies fatigue, dizziness, shortness of breath, chest pains, polyuria, polydipsia, or polyphagia.  Her cardiac risk factors include history of coronary artery disease.  Diabetes  She has type 2 diabetes mellitus. There are no hypoglycemic associated symptoms. Pertinent negatives for hypoglycemia include no headaches or sweats. Associated symptoms include foot paresthesias (right great toe and left upper extremity). Pertinent negatives for diabetes include no blurred vision, no fatigue, no polydipsia, no polyphagia, no polyuria, no visual change, no weakness and no weight loss. Pertinent negatives for hypoglycemia complications include no blackouts. Symptoms are improving. Pertinent negatives for diabetic complications include no CVA, heart disease, peripheral neuropathy or retinopathy. Risk factors for coronary artery disease include dyslipidemia, sedentary lifestyle and post-menopausal. Current diabetic treatment includes oral agent (monotherapy). She is compliant with treatment all of the time. Her weight is stable. She is following a diabetic diet. She participates in exercise intermittently. An ACE inhibitor/angiotensin II receptor blocker is being taken. She does not see a podiatrist.Eye exam is current.  Hypertension  This is a chronic problem. The problem is controlled. Pertinent negatives include no blurred vision, headaches, neck pain, orthopnea, palpitations, peripheral edema, PND or sweats. There are no associated agents to  hypertension. Risk factors for coronary artery disease include dyslipidemia and post-menopausal state. Past treatments include ACE inhibitors and beta blockers. Compliance problems include diet.  There is no history of kidney disease, CAD/MI, CVA, heart failure, left ventricular hypertrophy or retinopathy. There is no history of chronic renal disease, coarctation of the aorta, hyperaldosteronism, hypercortisolism, hyperparathyroidism, sleep apnea or a thyroid problem.  Hyperlipidemia  She has no history of chronic renal disease.     Past Medical History:  Diagnosis Date  . Anemia   . CAD (coronary artery disease)    a. 06/2015 BMS x2 to LCx. b. recurrent CP, cath 09/18/2015 patent stent, tiny diag stenosis medical therapy.   . CHF (congestive heart failure) ([place])   . Diabetes mellitus without complication ([place])   . GERD (gastroesophageal reflux disease)    ?  Marland Kitchen Hyperlipidemia with target LDL less than 70   . Hypertension   . Shortness of breath dyspnea    with exertion  . STEMI (ST elevation myocardial infarction) ([place]) 06/16/2015   BMS x 2 CFX  . Thyroid nodule    benign   Immunization History  Administered Date(s) Administered  . Influenza,inj,Quad PF,6+ Mos 09/18/2015, 12/14/2016, 09/14/2017  . Pneumococcal Polysaccharide-23 09/18/2015  . Tdap 03/21/2012    Diabetic Foot Exam - Simple   No data filed    Body mass index is 30.51 kg/m. Review of Systems  Constitutional: Negative for fatigue, unexpected weight change and weight loss.  HENT: Negative.   Eyes: Negative.  Negative for blurred vision.  Respiratory: Negative for cough, chest tightness and wheezing.   Cardiovascular: Negative for palpitations, orthopnea, leg swelling and PND.  Gastrointestinal: Negative.   Endocrine: Negative.  Negative for polydipsia, polyphagia and polyuria.  Musculoskeletal: Negative for neck pain.  Skin: Negative.   Allergic/Immunologic: Negative for immunocompromised state.  Neurological:  Negative.  Negative for weakness and headaches.  Hematological: Negative.   Psychiatric/Behavioral: Negative.         Objective:   Physical Exam  Constitutional: She is oriented to person, place, and time.  HENT:  Head: Normocephalic and atraumatic.  Right Ear: External ear normal.  Left Ear: External ear normal.  Nose: Nose normal.  Mouth/Throat: Oropharynx is clear and moist.  Eyes: Pupils are equal, round, and reactive to light. Conjunctivae and EOM are normal. No scleral icterus.  Neck: Normal range of motion. Neck supple.  Cardiovascular: Normal rate, normal heart sounds and intact distal pulses.  Pulmonary/Chest: Effort normal and breath sounds normal.  Abdominal: Soft. Bowel sounds are normal.  Genitourinary: Vagina normal and uterus normal.  Neurological: She is alert and oriented to person, place, and time. She has normal reflexes.  Monofilament test negative  Skin: Skin is warm and dry.  Psychiatric: She has a normal mood and affect. Her behavior is normal. Judgment and thought content normal.  Vitals reviewed.    BP 132/70 (BP Location: Left Arm, Patient Position: Sitting, Cuff Size: Normal) Comment: manually  Pulse 70   Temp 98.5 F (36.9 C) (Oral)   Resp 16   Ht 5\' 6"  (1.676 m)   Wt 189 lb (85.7 kg)   LMP 12/29/2015   SpO2 100%   BMI 30.51 kg/m  Assessment & Plan:  1. Controlled type 2 diabetes mellitus without complication, without long-term current use of insulin (HCC) Hemoglobin a1C is 5.9, no medication changes warranted on today.  Recommend a lowfat, low carbohydrate diet divided over 5-6 small meals, increase water intake to 6-8 glasses, and 150 minutes per week of cardiovascular exercise.    - Urinalysis Dipstick - HgB X9K - Basic Metabolic Panel - Ambulatory referral to Ophthalmology  2. Essential hypertension Blood pressure is at goal on current medication regimen. We have discussed target BP range and blood pressure goal. I have advised  patient to check BP regularly and to call us back or report to clinic if the numbers are consistently higher than 140/90. We discussed the importance of compliance with medical therapy and DASH diet recommended, consequences of uncontrolled hypertension discussed.  - continue current BP medications - Basic Metabolic Panel - Ambulatory referral to Ophthalmology  3. Hyperlipidemia with target LDL less than 70 Continue statin and ASA therapy.   RTC; 3 months for chronic conditions   Donia Pounds  MSN, FNP-C [facility] [address] [place], [place] [address] [phone]

## 2018-03-15 LAB — BASIC METABOLIC PANEL
BUN / CREAT RATIO: 16 (ref 9–23)
BUN: 16 mg/dL (ref 6–24)
CALCIUM: 9.2 mg/dL (ref 8.7–10.2)
CHLORIDE: 102 mmol/L (ref 96–106)
CO2: 24 mmol/L (ref 20–29)
Creatinine, Ser: 1 mg/dL (ref 0.57–1.00)
GFR calc Af Amer: 72 mL/min/{1.73_m2} (ref >59)
GFR calc non Af Amer: 62 mL/min/{1.73_m2} (ref >59)
GLUCOSE: 97 mg/dL (ref 65–99)
Potassium: 4.3 mmol/L (ref 3.5–5.2)
Sodium: 140 mmol/L (ref 134–144)

## 2018-04-02 ENCOUNTER — Encounter (HOSPITAL_COMMUNITY): Payer: Self-pay

## 2018-04-02 ENCOUNTER — Emergency Department (HOSPITAL_COMMUNITY): Payer: Medicaid Other

## 2018-04-02 ENCOUNTER — Emergency Department (HOSPITAL_COMMUNITY)
Admission: EM | Admit: 2018-04-02 | Discharge: 2018-04-02 | Disposition: A | Discharge status: Home / Self Care | Payer: Medicaid Other | Attending: Emergency Medicine | Admitting: Emergency Medicine

## 2018-04-02 DIAGNOSIS — J069 Acute upper respiratory infection, unspecified: Secondary | ICD-10-CM | POA: Diagnosis not present

## 2018-04-02 DIAGNOSIS — Z7984 Long term (current) use of oral hypoglycemic drugs: Secondary | ICD-10-CM | POA: Insufficient documentation

## 2018-04-02 DIAGNOSIS — Z7982 Long term (current) use of aspirin: Secondary | ICD-10-CM | POA: Diagnosis not present

## 2018-04-02 DIAGNOSIS — I509 Heart failure, unspecified: Secondary | ICD-10-CM | POA: Insufficient documentation

## 2018-04-02 DIAGNOSIS — E119 Type 2 diabetes mellitus without complications: Secondary | ICD-10-CM | POA: Diagnosis not present

## 2018-04-02 DIAGNOSIS — I252 Old myocardial infarction: Secondary | ICD-10-CM | POA: Insufficient documentation

## 2018-04-02 DIAGNOSIS — I2510 Atherosclerotic heart disease of native coronary artery without angina pectoris: Secondary | ICD-10-CM | POA: Diagnosis not present

## 2018-04-02 DIAGNOSIS — I110 Hypertensive heart disease with heart failure: Secondary | ICD-10-CM | POA: Insufficient documentation

## 2018-04-02 DIAGNOSIS — R05 Cough: Secondary | ICD-10-CM | POA: Diagnosis present

## 2018-04-02 LAB — GROUP A STREP BY PCR: Group A Strep by PCR: NOT DETECTED

## 2018-04-02 NOTE — ED Triage Notes (Signed)
Pt presents for evaluation of cough, sore throat, nausea, fevers, and generalized body aches x 2 weeks.

## 2018-04-02 NOTE — ED Provider Notes (Signed)
[facility] EMERGENCY DEPARTMENT Provider Note   CSN: [id number] Arrival date & time: 04/02/18  1703     History   Chief Complaint Chief Complaint  Patient presents with  . Cough  . Fever    HPI Virginia [name] is a 57 y.o. female.  HPI  Patient is a 57 year old female comes in today complaining of cough, congestion, ear fullness, sore throat for the past 2 weeks.  Patient states that she initially became ill and started to feel better approximately 1 week after onset of symptoms, during that time her husband became infected with the same symptoms and patient started to improve, however patient then again worsened.  Patient denies any chest pain, shortness of breath lower extremity pain or swelling.  Patient denies any abdominal pain dysuria.  Past Medical History:  Diagnosis Date  . Anemia   . CAD (coronary artery disease)    a. 06/2015 BMS x2 to LCx. b. recurrent CP, cath 09/18/2015 patent stent, tiny diag stenosis medical therapy.   . CHF (congestive heart failure) ([place])   . Diabetes mellitus without complication ([place])   . GERD (gastroesophageal reflux disease)    ?  Marland Kitchen Hyperlipidemia with target LDL less than 70   . Hypertension   . Shortness of breath dyspnea    with exertion  . STEMI (ST elevation myocardial infarction) ([place]) 06/16/2015   BMS x 2 CFX  . Thyroid nodule    benign    Patient Active Problem List   Diagnosis Date Noted  . CAD (coronary artery disease) 12/26/2017  . Old MI (myocardial infarction) 12/26/2017  . Gastroesophageal reflux disease without esophagitis 03/16/2017  . Blood pressure check 12/28/2016  . Bradycardia 12/14/2016  . Controlled type 2 diabetes mellitus without complication, without long-term current use of insulin ([place]) 03/29/2016  . Numbness of anterior thigh 03/29/2016  . Menopausal hot flushes 03/10/2016  . Essential hypertension 12/29/2015  . Fibroid uterus 11/13/2015  . Transient right leg weakness  10/05/2015  . Acute GI bleeding   . Symptomatic anemia 09/19/2015  . Vasovagal near syncope 09/19/2015  . Dysfunctional uterine bleeding 09/19/2015  . Diarrhea due to drug 09/19/2015  . Heme positive stool 09/19/2015  . Chest pain 09/17/2015  . Multinodular goiter 07/09/2015  . CAD S/P CFX BMS (? med complinace) x 2 06/16/15 07/01/2015  . Acute renal insufficiency 07/01/2015  . Abdominal pain 07/01/2015  . Nodular goiter 06/23/2015  . Diabetes mellitus type 2, controlled ([place]) 06/23/2015  . STEMI -06/16/15 06/17/2015  . Hyperlipidemia with target LDL less than 70     Past Surgical History:  Procedure Laterality Date  . ABDOMINAL HYSTERECTOMY N/A 02/02/2016   Procedure: HYSTERECTOMY ABDOMINAL WITH BILATERAL SALPINGECTOMY;  Surgeon: Woodroe Mode, MD;  Location: [facility];  Service: Gynecology;  Laterality: N/A;  . APPENDECTOMY    . CARDIAC CATHETERIZATION N/A 06/17/2015   Procedure: Left Heart Cath and Coronary Angiography;  Surgeon: Jettie Booze, MD;  Location: [facility];  Service: Cardiovascular;  Laterality: N/A;  . CARDIAC CATHETERIZATION  06/17/2015   Procedure: Coronary Stent Intervention;  Surgeon: Jettie Booze, MD;  Location: [facility];  Service: Cardiovascular;;  . CARDIAC CATHETERIZATION N/A 09/18/2015   Procedure: Left Heart Cath and Coronary Angiography;  Surgeon: Troy Sine, MD;  Location: [facility];  Service: Cardiovascular;  Laterality: N/A;  . CESAREAN SECTION     Myomectomy done during cesarean section  . COLONOSCOPY N/A 09/20/2015  Procedure: COLONOSCOPY;  Surgeon: Carol Ada, MD;  Location: [facility];  Service: Endoscopy;  Laterality: N/A;  . COLONOSCOPY    . ESOPHAGOGASTRODUODENOSCOPY N/A 09/20/2015   Procedure: ESOPHAGOGASTRODUODENOSCOPY (EGD);  Surgeon: Carol Ada, MD;  Location: [facility] END[facility]COPY;  Service: Endoscopy;  Laterality: N/A;  . olympus  quick clip pro gastric body  10/09/15     OB History    Gravida  2    Para  2   Term  2   Preterm      AB      Living  2     SAB      TAB      Ectopic      Multiple      Live Births               Home Medications    Prior to Admission medications   Medication Sig Start Date End Date Taking? Authorizing Provider  acetaminophen (TYLENOL) 325 MG tablet Take 2 tablets (650 mg total) by mouth every 4 (four) hours as needed for mild pain. 11/17/17   Consuelo Pandy, PA-C  aspirin EC 81 MG tablet Take 81 mg by mouth daily.    [provider]  atorvastatin (LIPITOR) 40 MG tablet Take 1 tablet (40 mg total) by mouth daily. 12/26/17 03/26/18  Jettie Booze, MD  estradiol (ESTRACE) 1 MG tablet TAKE 1 TABLET BY MOUTH DAILY 01/10/18   Dorena Dew, FNP  FEROSUL 325 (65 Fe) MG tablet TAKE 1 TABLET BY MOUTH DAILY WITH BREAKFAST. 09/21/17   Dorena Dew, FNP  gabapentin (NEURONTIN) 300 MG capsule TAKE 1 CAPSULE BY MOUTH AT BEDTIME. 01/10/18   Dorena Dew, FNP  isosorbide mononitrate (IMDUR) 30 MG 24 hr tablet Take 0.5 tablets (15 mg total) by mouth daily. 03/14/17   Dorena Dew, FNP  lisinopril (PRINIVIL,ZESTRIL) 2.5 MG tablet TAKE 1 TABLET BY MOUTH DAILY. 03/06/18   Dorena Dew, FNP  metFORMIN (GLUCOPHAGE) 500 MG tablet Take 1 tablet (500 mg total) by mouth daily with breakfast. 06/14/17   Dorena Dew, FNP  metoprolol tartrate (LOPRESSOR) 25 MG tablet Take 0.5 tablets (12.5 mg total) by mouth 2 (two) times daily. 12/26/17   Jettie Booze, MD  nitroGLYCERIN (NITROSTAT) 0.4 MG SL tablet Place 1 tablet (0.4 mg total) under the tongue every 5 (five) minutes as needed for chest pain. 06/14/17   Dorena Dew, FNP  omeprazole (PRILOSEC) 20 MG capsule TAKE 1 CAPSULE BY MOUTH DAILY. 09/21/17   Dorena Dew, FNP  vitamin B-12 (CYANOCOBALAMIN) 1000 MCG tablet Take 1 tablet (1,000 mcg total) by mouth daily. 03/29/16   Tresa Garter, MD    Family History Family History  Problem Relation Age of Onset  .  Stroke Mother   . Heart disease Mother   . Diabetes Mother   . Cancer Father   . Hypertension Sister   . Hyperlipidemia Sister   . Heart attack Brother   . Hypertension Brother   . Heart attack Maternal Uncle   . Asthma Paternal Uncle   . Diabetes Paternal Uncle   . Diabetes Paternal Aunt   . Thyroid disease Neg Hx     Social History Social History   Tobacco Use  . Smoking status: Never Smoker  . Smokeless tobacco: Never Used  Substance Use Topics  . Alcohol use: No    Alcohol/week: 0.0 oz  . Drug use: No     Allergies   Patient  has no known allergies.   Review of Systems Review of Systems  Constitutional: Positive for appetite change, chills, fatigue and fever.  HENT: Positive for congestion and sore throat. Negative for trouble swallowing.   Respiratory: Negative for shortness of breath.   Cardiovascular: Negative for chest pain.  Gastrointestinal: Positive for nausea. Negative for abdominal pain and vomiting.  Neurological: Negative for dizziness, weakness and light-headedness.  All other systems reviewed and are negative.    Physical Exam Updated Vital Signs BP (!) 161/86 (BP Location: Right Arm)   Pulse 85   Temp 99.5 F (37.5 C) (Oral)   Resp 18   LMP 12/29/2015   SpO2 100%   Physical Exam  Constitutional: She is oriented to person, place, and time. She appears well-developed and well-nourished. No distress.  HENT:  Head: Normocephalic and atraumatic.  Eyes: Conjunctivae are normal.  Neck: Normal range of motion. Neck supple.  Cardiovascular: Normal rate and regular rhythm.  No murmur heard. Pulmonary/Chest: Effort normal and breath sounds normal. No respiratory distress.  Abdominal: Soft. There is no tenderness.  Musculoskeletal: Normal range of motion. She exhibits no edema.  Neurological: She is alert and oriented to person, place, and time.  Skin: Skin is warm and dry.  Psychiatric: She has a normal mood and affect.  Nursing note and  vitals reviewed.    ED Treatments / Results  Labs (all labs ordered are listed, but only abnormal results are displayed) Labs Reviewed  GROUP A STREP BY PCR    EKG None  Radiology Dg Chest 2 View  Result Date: 04/02/2018 CLINICAL DATA:  Subacute onset of cough and generalized chest pain. EXAM: CHEST - 2 VIEW COMPARISON:  Chest radiograph and CTA of the chest performed 11/16/2017 FINDINGS: The lungs are well-aerated and clear. There is no evidence of focal opacification, pleural effusion or pneumothorax. The heart is normal in size; the mediastinal contour is within normal limits. No acute osseous abnormalities are seen. IMPRESSION: No acute cardiopulmonary process seen. Electronically Signed   By: Garald Balding M.D.   On: 04/02/2018 18:08    Procedures Procedures (including critical care time)  Medications Ordered in ED Medications - No data to display   Initial Impression / Assessment and Plan / ED Course  I have reviewed the triage vital signs and the nursing notes.  Pertinent labs & imaging results that were available during my care of the patient were reviewed by me and considered in my medical decision making (see chart for details).     Chest x-ray shows no evidence of acute cardiopulmonary abnormality.  EKG within normal limits.  Strep screen negative.  Believe patient to be suffering from viral illness with subsequent reinfection.  Patient given extensive return precautions as well as instructions to follow-up if symptoms do not improve in the next 3 to 4 days for repeat chest x-ray and evaluation. Pt given appropriate f/u and return precautions. Pt voiced understanding and is agreeable to discharge at this time.    Final Clinical Impressions(s) / ED Diagnoses   Final diagnoses:  Upper respiratory tract infection, unspecified type    ED Discharge Orders    None       Chapman Moss, MD 04/03/18 Burnis Medin    Tanna Furry, MD 04/08/18 2050

## 2018-04-03 ENCOUNTER — Other Ambulatory Visit: Payer: Self-pay | Admitting: Family Medicine

## 2018-04-03 DIAGNOSIS — I10 Essential (primary) hypertension: Secondary | ICD-10-CM

## 2018-04-03 MED FILL — metFORMIN HCL 500 MG TABS: 500 | 30 days supply | Qty: 30 | Fill #6

## 2018-04-03 MED FILL — ISOSORBIDE MN ER 30 MG TAB: 30 | 30 days supply | Qty: 15 | Fill #0

## 2018-04-03 MED FILL — OMEPRAZOLE 20 MG CAP: 20 | 30 days supply | Qty: 30 | Fill #5

## 2018-04-03 MED FILL — METOPROLOL TARTRATE 25 MG T: 25 | 30 days supply | Qty: 30 | Fill #3

## 2018-04-03 MED FILL — ATORVASTATIN CALCIUM 40 MG: 40 | 30 days supply | Qty: 30 | Fill #2

## 2018-04-03 MED FILL — LISINOPRIL 2.5 MG TABLET: 2.5 | 30 days supply | Qty: 30 | Fill #1

## 2018-04-03 MED FILL — ESTRADIOL 1 MG TABLET: 1 | 30 days supply | Qty: 30 | Fill #2

## 2018-05-08 ENCOUNTER — Other Ambulatory Visit: Payer: Self-pay | Admitting: Family Medicine

## 2018-05-08 DIAGNOSIS — K219 Gastro-esophageal reflux disease without esophagitis: Secondary | ICD-10-CM

## 2018-05-16 MED FILL — ISOSORBIDE MN ER 30 MG TAB: 30 | 30 days supply | Qty: 15 | Fill #1

## 2018-05-16 MED FILL — OMEPRAZOLE 20 MG CAP: 20 | 30 days supply | Qty: 30 | Fill #0

## 2018-05-29 MED FILL — LISINOPRIL 2.5 MG TABLET: 2.5 | 30 days supply | Qty: 30 | Fill #2

## 2018-05-29 MED FILL — ESTRADIOL 1 MG TABLET: 1 | 30 days supply | Qty: 30 | Fill #3

## 2018-05-29 MED FILL — ATORVASTATIN CALCIUM 40 MG: 40 | 30 days supply | Qty: 30 | Fill #3

## 2018-05-29 MED FILL — metFORMIN HCL 500 MG TABS: 500 | 30 days supply | Qty: 30 | Fill #7

## 2018-05-30 ENCOUNTER — Emergency Department (HOSPITAL_COMMUNITY): Payer: Medicaid Other

## 2018-05-30 ENCOUNTER — Other Ambulatory Visit: Payer: Self-pay

## 2018-05-30 ENCOUNTER — Emergency Department (HOSPITAL_COMMUNITY)
Admission: EM | Admit: 2018-05-30 | Discharge: 2018-05-31 | Disposition: A | Discharge status: Home / Self Care | Payer: Medicaid Other | Attending: Emergency Medicine | Admitting: Emergency Medicine

## 2018-05-30 ENCOUNTER — Encounter (HOSPITAL_COMMUNITY): Payer: Self-pay | Admitting: Emergency Medicine

## 2018-05-30 DIAGNOSIS — E119 Type 2 diabetes mellitus without complications: Secondary | ICD-10-CM | POA: Insufficient documentation

## 2018-05-30 DIAGNOSIS — I509 Heart failure, unspecified: Secondary | ICD-10-CM | POA: Insufficient documentation

## 2018-05-30 DIAGNOSIS — Z7982 Long term (current) use of aspirin: Secondary | ICD-10-CM | POA: Insufficient documentation

## 2018-05-30 DIAGNOSIS — Z79899 Other long term (current) drug therapy: Secondary | ICD-10-CM | POA: Diagnosis not present

## 2018-05-30 DIAGNOSIS — M25571 Pain in right ankle and joints of right foot: Secondary | ICD-10-CM | POA: Insufficient documentation

## 2018-05-30 DIAGNOSIS — Z7984 Long term (current) use of oral hypoglycemic drugs: Secondary | ICD-10-CM | POA: Diagnosis not present

## 2018-05-30 DIAGNOSIS — I2510 Atherosclerotic heart disease of native coronary artery without angina pectoris: Secondary | ICD-10-CM | POA: Diagnosis not present

## 2018-05-30 DIAGNOSIS — I252 Old myocardial infarction: Secondary | ICD-10-CM | POA: Diagnosis not present

## 2018-05-30 DIAGNOSIS — I110 Hypertensive heart disease with heart failure: Secondary | ICD-10-CM | POA: Diagnosis not present

## 2018-05-30 MED ORDER — HYDROCODONE-ACETAMINOPHEN 5-325 MG PO TABS
1.0000 | ORAL_TABLET | Freq: Once | ORAL | Status: AC
  Administered 2018-05-31: 1 via ORAL
  Filled 2018-05-30: qty 1

## 2018-05-30 MED ORDER — NAPROXEN 500 MG PO TABS: 500.0000 mg | ORAL_TABLET | Freq: Two times a day (BID) | ORAL | 0 refills | Status: DC

## 2018-05-30 NOTE — ED Notes (Signed)
Patient transported to X-ray 

## 2018-05-30 NOTE — ED Provider Notes (Signed)
[facility] EMERGENCY DEPARTMENT Provider Note   CSN: [id number] Arrival date & time: 05/30/18  2059     History   Chief Complaint Chief Complaint  Patient presents with  . Fall    Ankle injury    HPI Virginia [name] is a 57 y.o. female with a history of hypertension, hyperlipidemia who presents emergency department today for right ankle and foot pain.  Patient reports she was walking when she felt her right hip give way and she twisted her right ankle and inverted fashion.  She denies fall, head trauma or loss of consciousness.  She reports she has had ongoing pain of her right ankle is worse over the lateral malleolus.  She reports that she has been able to walk on this but it causes increased pain.  She has not taken anything for symptoms prior to arrival.  She knows nothing makes her symptoms better.  She denies any open wounds, numbness/tingling/weakness.  She denies any hip pain.  HPI  Past Medical History:  Diagnosis Date  . Anemia   . CAD (coronary artery disease)    a. 06/2015 BMS x2 to LCx. b. recurrent CP, cath 09/18/2015 patent stent, tiny diag stenosis medical therapy.   . CHF (congestive heart failure) ([place])   . Diabetes mellitus without complication ([place])   . GERD (gastroesophageal reflux disease)    ?  Marland Kitchen Hyperlipidemia with target LDL less than 70   . Hypertension   . Shortness of breath dyspnea    with exertion  . STEMI (ST elevation myocardial infarction) ([place]) 06/16/2015   BMS x 2 CFX  . Thyroid nodule    benign    Patient Active Problem List   Diagnosis Date Noted  . CAD (coronary artery disease) 12/26/2017  . Old MI (myocardial infarction) 12/26/2017  . Gastroesophageal reflux disease without esophagitis 03/16/2017  . Blood pressure check 12/28/2016  . Bradycardia 12/14/2016  . Controlled type 2 diabetes mellitus without complication, without long-term current use of insulin ([place]) 03/29/2016  . Numbness of anterior thigh  03/29/2016  . Menopausal hot flushes 03/10/2016  . Essential hypertension 12/29/2015  . Fibroid uterus 11/13/2015  . Transient right leg weakness 10/05/2015  . Acute GI bleeding   . Symptomatic anemia 09/19/2015  . Vasovagal near syncope 09/19/2015  . Dysfunctional uterine bleeding 09/19/2015  . Diarrhea due to drug 09/19/2015  . Heme positive stool 09/19/2015  . Chest pain 09/17/2015  . Multinodular goiter 07/09/2015  . CAD S/P CFX BMS (? med complinace) x 2 06/16/15 07/01/2015  . Acute renal insufficiency 07/01/2015  . Abdominal pain 07/01/2015  . Nodular goiter 06/23/2015  . Diabetes mellitus type 2, controlled ([place]) 06/23/2015  . STEMI -06/16/15 06/17/2015  . Hyperlipidemia with target LDL less than 70     Past Surgical History:  Procedure Laterality Date  . ABDOMINAL HYSTERECTOMY N/A 02/02/2016   Procedure: HYSTERECTOMY ABDOMINAL WITH BILATERAL SALPINGECTOMY;  Surgeon: Woodroe Mode, MD;  Location: [facility];  Service: Gynecology;  Laterality: N/A;  . APPENDECTOMY    . CARDIAC CATHETERIZATION N/A 06/17/2015   Procedure: Left Heart Cath and Coronary Angiography;  Surgeon: Jettie Booze, MD;  Location: [facility];  Service: Cardiovascular;  Laterality: N/A;  . CARDIAC CATHETERIZATION  06/17/2015   Procedure: Coronary Stent Intervention;  Surgeon: Jettie Booze, MD;  Location: [facility];  Service: Cardiovascular;;  . CARDIAC CATHETERIZATION N/A 09/18/2015   Procedure: Left Heart Cath and Coronary Angiography;  Surgeon: Marcello Moores  Floyce Stakes, MD;  Location: [facility];  Service: Cardiovascular;  Laterality: N/A;  . CESAREAN SECTION     Myomectomy done during cesarean section  . COLONOSCOPY N/A 09/20/2015   Procedure: COLONOSCOPY;  Surgeon: Carol Ada, MD;  Location: [facility] END[facility]COPY;  Service: Endoscopy;  Laterality: N/A;  . COLONOSCOPY    . ESOPHAGOGASTRODUODENOSCOPY N/A 09/20/2015   Procedure: ESOPHAGOGASTRODUODENOSCOPY (EGD);  Surgeon: Carol Ada, MD;   Location: [facility] ENDOSCOPY;  Service: Endoscopy;  Laterality: N/A;  . olympus  quick clip pro gastric body  10/09/15     OB History    Gravida  2   Para  2   Term  2   Preterm      AB      Living  2     SAB      TAB      Ectopic      Multiple      Live Births               Home Medications    Prior to Admission medications   Medication Sig Start Date End Date Taking? Authorizing Provider  acetaminophen (TYLENOL) 325 MG tablet Take 2 tablets (650 mg total) by mouth every 4 (four) hours as needed for mild pain. 11/17/17   Consuelo Pandy, PA-C  aspirin EC 81 MG tablet Take 81 mg by mouth daily.    [provider]  atorvastatin (LIPITOR) 40 MG tablet Take 1 tablet (40 mg total) by mouth daily. 12/26/17 03/26/18  Jettie Booze, MD  estradiol (ESTRACE) 1 MG tablet TAKE 1 TABLET BY MOUTH DAILY 01/10/18   Dorena Dew, FNP  FEROSUL 325 (65 Fe) MG tablet TAKE 1 TABLET BY MOUTH DAILY WITH BREAKFAST. 09/21/17   Dorena Dew, FNP  gabapentin (NEURONTIN) 300 MG capsule TAKE 1 CAPSULE BY MOUTH AT BEDTIME. 01/10/18   Dorena Dew, FNP  isosorbide mononitrate (IMDUR) 30 MG 24 hr tablet TAKE 1/2 TABLET BY MOUTH DAILY. 04/03/18   Dorena Dew, FNP  lisinopril (PRINIVIL,ZESTRIL) 2.5 MG tablet TAKE 1 TABLET BY MOUTH DAILY. 03/06/18   Dorena Dew, FNP  metFORMIN (GLUCOPHAGE) 500 MG tablet Take 1 tablet (500 mg total) by mouth daily with breakfast. 06/14/17   Dorena Dew, FNP  metoprolol tartrate (LOPRESSOR) 25 MG tablet Take 0.5 tablets (12.5 mg total) by mouth 2 (two) times daily. 12/26/17   Jettie Booze, MD  nitroGLYCERIN (NITROSTAT) 0.4 MG SL tablet Place 1 tablet (0.4 mg total) under the tongue every 5 (five) minutes as needed for chest pain. 06/14/17   Dorena Dew, FNP  omeprazole (PRILOSEC) 20 MG capsule TAKE 1 CAPSULE BY MOUTH DAILY. 05/08/18   Dorena Dew, FNP  vitamin B-12 (CYANOCOBALAMIN) 1000 MCG tablet Take 1 tablet (1,000  mcg total) by mouth daily. 03/29/16   Tresa Garter, MD    Family History Family History  Problem Relation Age of Onset  . Stroke Mother   . Heart disease Mother   . Diabetes Mother   . Cancer Father   . Hypertension Sister   . Hyperlipidemia Sister   . Heart attack Brother   . Hypertension Brother   . Heart attack Maternal Uncle   . Asthma Paternal Uncle   . Diabetes Paternal Uncle   . Diabetes Paternal Aunt   . Thyroid disease Neg Hx     Social History Social History   Tobacco Use  . Smoking status: Never Smoker  .  Smokeless tobacco: Never Used  Substance Use Topics  . Alcohol use: No    Alcohol/week: 0.0 oz  . Drug use: No     Allergies   Patient has no known allergies.   Review of Systems Review of Systems  All other systems reviewed and are negative.    Physical Exam Updated Vital Signs BP (!) 137/91 (BP Location: Left Arm)   Pulse 70   Temp 98 F (36.7 C) (Oral)   Resp 18   Ht 5\' 6"  (1.676 m)   Wt 84.4 kg (186 lb)   LMP 12/29/2015   SpO2 100%   BMI 30.02 kg/m   Physical Exam  Constitutional: She appears well-developed and well-nourished.  HENT:  Head: Normocephalic and atraumatic.  Right Ear: External ear normal.  Left Ear: External ear normal.  Eyes: Conjunctivae are normal. Right eye exhibits no discharge. Left eye exhibits no discharge. No scleral icterus.  Cardiovascular:  Pulses:      Dorsalis pedis pulses are 2+ on the right side.       Posterior tibial pulses are 2+ on the right side.  Pulmonary/Chest: Effort normal. No respiratory distress.  Musculoskeletal:       Right hip: Normal.       Right ankle: She exhibits no swelling. Tenderness. Lateral malleolus and head of 5th metatarsal tenderness found. Achilles tendon normal.       Right foot: There is bony tenderness. There is normal range of motion and no swelling.       Feet:  No leg shortening or external rotation of the right leg.  No tenderness palpation over the  right hip.  Passive range of motion of the right hip without pain or difficulty.  Neurological: She is alert. She has normal strength. No sensory deficit.  Skin: Skin is warm, dry and intact. Capillary refill takes less than 2 seconds. No abrasion and no laceration noted. No erythema. No pallor.  Psychiatric: She has a normal mood and affect.  Nursing note and vitals reviewed.    ED Treatments / Results  Labs (all labs ordered are listed, but only abnormal results are displayed) Labs Reviewed - No data to display  EKG None  Radiology Dg Ankle Complete Right  Result Date: 05/30/2018 CLINICAL DATA:  Hip gave out, fall at home. RIGHT ankle pain and swelling. EXAM: RIGHT ANKLE - COMPLETE 3+ VIEW COMPARISON:  None. FINDINGS: No fracture deformity nor dislocation. Corticated calcification lateral ankle joint space. The ankle mortise appears congruent and the tibiofibular syndesmosis intact. No destructive bony lesions. Soft tissue planes are non-suspicious. IMPRESSION: 1. No acute fracture deformity or dislocation. 2. Lateral ankle loose body. Electronically Signed   By: Elon Alas M.D.   On: 05/30/2018 22:17    Procedures Procedures (including critical care time)  Medications Ordered in ED Medications  HYDROcodone-acetaminophen (NORCO/VICODIN) 5-325 MG per tablet 1 tablet (1 tablet Oral Given 05/31/18 0011)     Initial Impression / Assessment and Plan / ED Course  I have reviewed the triage vital signs and the nursing notes.  Pertinent labs & imaging results that were available during my care of the patient were reviewed by me and considered in my medical decision making (see chart for details).     57 y.o. female presenting after an inversion of the right ankle.  Patient is neurovascularly intact.  There is no evidence of Achilles injury.  Patient X-Ray negative for obvious fracture or dislocation.  Suspect ankle sprain.  Pain managed in ED.  Pt advised to follow up with  orthopedics if symptoms persist for possibility of missed fracture diagnosis. Patient given ASO brace and crutches while in ED, conservative therapy recommended and discussed.  Return precautions discussed.  Patient will be dc home & is agreeable with above plan.  Final Clinical Impressions(s) / ED Diagnoses   Final diagnoses:  Acute right ankle pain    ED Discharge Orders        Ordered    naproxen (NAPROSYN) 500 MG tablet  2 times daily     05/30/18 2351       Lorelle Gibbs 05/31/18 0107    Deno Etienne, DO 05/31/18 [phone]

## 2018-05-30 NOTE — ED Triage Notes (Signed)
Patient stated " my hip gave way" and fell this afternoon at home , denies LOC/ambulatory , reports pain at right ankle with mild swelling .

## 2018-05-30 NOTE — Discharge Instructions (Signed)
Please read and follow all provided instructions.  Your diagnoses today include: Ankle sprain An ankle sprain is an injury to the ligaments that hold the ankle joint together causing them to get stretched or torn. It may take 4-6 weeks to heal fully. Your X-ray today showed no evidence of fracture (there is no evidence of broken bones).  For activity: Use crutches with non-weightbearing for the first few days. Exercises should be limited to pain free range of motion. You can start mobilization by tracing the alphabet with you foot in the air. Then, you may walk on your ankles as the pain allows (or as instructed). Start gradually with weight bearing on the affected ankle. Once you can walk pain free, then try jogging. When you can run forwards, then you can try moving side to side. If you cannot walk without crutches in one week, you need a recheck by your Family Doctor. It is important to keep all follow-up appointments as we discussed fractures may not appear until 1 week to 10 days after the acute injury. If you do not have a family doctor to follow-up with, you can see the list of phone numbers below. Please call today to make a followup appointment.  TREATMENT  Rest, ice, compression and elevation (RICE therapy) are the basic modes of treatment.   Rest is needed to allow your body to heal. Routine activities can be resumed when comfortable (as described above). Injury tendons and bones can take up to 6 weeks to heal. Tendons are cordlike structures that attach muscles and bones Ice: Apply ice to the sore area for 15 to 20 minutes, 3 to 4 times per day. Do this while you are awake for the first 2 days, or as directed. This can help reduce swelling and reduce pain.  Compression: this helps keep swelling down. It also gives support and helps with discomfort. If any lasting bandage has been applied, it should be removed and reapplied every 3-4 hours. It should not be applied tightly, but firmly enough to  keep swelling down. Watch fingers or toes for swelling, discoloration, coldness, numbness or excessive pain. If any of these problems occur, removed the bandage and reapply loosely. Contact your caregiver if these problems continue. If you were given an ankle stabilizer you may take it off at night and to take a shower or bath. Wiggle your toes in the splint several times per day if you are able.  Elevation helps reduce swelling and decrease your pain. With extremities such as the arms, hands, legs and feet, the injured area should be placed near or above the level of the heart if possible (place pillows underneath you leg/foot while you sleep to achieve this).  HOW TO MAKE AN ICE PACK  To make an ice pack, do one of the following:  Place crushed ice or a bag of frozen vegetables in a sealable plastic bag. Squeeze out the excess air. Place this bag inside another plastic bag. Slide the bag into a pillowcase or place a damp towel between your skin and the bag.  Mix 3 parts water with 1 part rubbing alcohol. Freeze the mixture in a sealable plastic bag. When you remove the mixture from the freezer, it will be slushy. Squeeze out the excess air. Place this bag inside another plastic bag. Slide the bag into a pillowcase or place a damp towel between your sk  Seek immediate medical attention if: You're toes are numb or tingling, appear gray or blue, or  you have severe pain. If this occurs please also elevate the leg and loosen the splint. Also if you have persistent pain and swelling, developed redness numbness or unexpected weakness, or your symptoms are getting worse rather than improving after several days. The symptoms may indicate that further evaluation or further x-rays are needed. Sometimes, x-rays may not show a small broken bone until one week or 10 days later. Make a follow-up appointment with your caregiver.  Additional Information:  Your vital signs today were: BP (!) 137/91 (BP Location: Left  Arm)    Pulse 70    Temp 98 F (36.7 C) (Oral)    Resp 18    Ht 5\' 6"  (1.676 m)    Wt 84.4 kg (186 lb)    LMP 12/29/2015    SpO2 100%    BMI 30.02 kg/m  If your blood pressure (BP) was elevated above 135/85 this visit, please have this repeated by your doctor within one month. ---------------

## 2018-05-31 MED FILL — NAPROXEN 500 MG TABLET: 500 | 15 days supply | Qty: 30 | Fill #0

## 2018-05-31 NOTE — Progress Notes (Signed)
Orthopedic Tech Progress Note Patient Details:  Virginia [name] 07/17/1961 [id number]  Ortho Devices Type of Ortho Device: Crutches, ASO Ortho Device/Splint Location: rle Ortho Device/Splint Interventions: Ordered, Application, Adjustment   Post Interventions Patient Tolerated: Well Instructions Provided: Care of device, Adjustment of device   Karolee Stamps 05/31/2018, 12:33 AM

## 2018-05-31 NOTE — ED Notes (Signed)
Ortho at bedside.

## 2018-06-11 MED FILL — METOPROLOL TARTRATE 25 MG T: 25 | 30 days supply | Qty: 30 | Fill #4

## 2018-06-22 MED FILL — ISOSORBIDE MN ER 30 MG TAB: 30 | 30 days supply | Qty: 15 | Fill #2

## 2018-06-22 MED FILL — OMEPRAZOLE 20 MG CAP: 20 | 30 days supply | Qty: 30 | Fill #1

## 2018-07-04 ENCOUNTER — Other Ambulatory Visit: Payer: Self-pay

## 2018-07-04 DIAGNOSIS — E119 Type 2 diabetes mellitus without complications: Secondary | ICD-10-CM

## 2018-07-04 MED ORDER — METFORMIN HCL 500 MG PO TABS: 500.0000 mg | ORAL_TABLET | Freq: Every day | ORAL | 3 refills | Status: DC

## 2018-07-04 MED FILL — LISINOPRIL 2.5 MG TABLET: 2.5 | 30 days supply | Qty: 30 | Fill #3

## 2018-07-04 MED FILL — ESTRADIOL 1 MG TABLET: 1 | 30 days supply | Qty: 30 | Fill #4

## 2018-07-04 MED FILL — metFORMIN HCL 500 MG TABS: 500 | 30 days supply | Qty: 30 | Fill #0

## 2018-07-19 MED FILL — ATORVASTATIN CALCIUM 40 MG: 40 | 30 days supply | Qty: 30 | Fill #4

## 2018-07-19 MED FILL — ISOSORBIDE MN ER 30 MG TAB: 30 | 30 days supply | Qty: 15 | Fill #3

## 2018-07-19 MED FILL — OMEPRAZOLE 20 MG CAP: 20 | 30 days supply | Qty: 30 | Fill #2

## 2018-07-19 MED FILL — METOPROLOL TARTRATE 25 MG T: 25 | 30 days supply | Qty: 30 | Fill #5

## 2018-07-24 MED FILL — LATANOPROST 0.005% EYE DRP: 0.005 | 18 days supply | Qty: 3 | Fill #0

## 2018-08-10 MED FILL — LISINOPRIL 2.5 MG TABLET: 2.5 | 30 days supply | Qty: 30 | Fill #4

## 2018-08-10 MED FILL — ESTRADIOL 1 MG TABLET: 1 | 30 days supply | Qty: 30 | Fill #5

## 2018-08-10 MED FILL — metFORMIN HCL 500 MG TABS: 500 | 30 days supply | Qty: 30 | Fill #1

## 2018-08-10 MED FILL — LATANOPROST 0.005% EYE DRP: 0.005 | 18 days supply | Qty: 3 | Fill #1

## 2018-08-30 MED FILL — ISOSORBIDE MN ER 30 MG TAB: 30 | 30 days supply | Qty: 15 | Fill #4

## 2018-08-30 MED FILL — METOPROLOL TARTRATE 25 MG T: 25 | 30 days supply | Qty: 30 | Fill #6

## 2018-08-30 MED FILL — OMEPRAZOLE 20 MG CAP: 20 | 30 days supply | Qty: 30 | Fill #3

## 2018-09-14 ENCOUNTER — Ambulatory Visit: Payer: Medicaid Other | Admitting: Family Medicine

## 2018-09-19 MED FILL — metFORMIN HCL 500 MG TABS: 500 | 30 days supply | Qty: 30 | Fill #2

## 2018-09-19 MED FILL — ESTRADIOL 1 MG TABLET: 1 | 30 days supply | Qty: 30 | Fill #6

## 2018-09-19 MED FILL — ATORVASTATIN CALCIUM 40 MG: 40 | 30 days supply | Qty: 30 | Fill #5

## 2018-09-19 MED FILL — LISINOPRIL 2.5 MG TABLET: 2.5 | 30 days supply | Qty: 30 | Fill #5

## 2018-09-25 ENCOUNTER — Encounter: Payer: Self-pay | Admitting: Family Medicine

## 2018-09-25 ENCOUNTER — Ambulatory Visit (INDEPENDENT_AMBULATORY_CARE_PROVIDER_SITE_OTHER): Payer: Self-pay | Admitting: Family Medicine

## 2018-09-25 VITALS — BP 140/70 | HR 84 | Temp 98.1°F | Ht 66.0 in | Wt 189.2 lb

## 2018-09-25 DIAGNOSIS — E785 Hyperlipidemia, unspecified: Secondary | ICD-10-CM

## 2018-09-25 DIAGNOSIS — G629 Polyneuropathy, unspecified: Secondary | ICD-10-CM

## 2018-09-25 DIAGNOSIS — I10 Essential (primary) hypertension: Secondary | ICD-10-CM

## 2018-09-25 DIAGNOSIS — E119 Type 2 diabetes mellitus without complications: Secondary | ICD-10-CM

## 2018-09-25 DIAGNOSIS — N951 Menopausal and female climacteric states: Secondary | ICD-10-CM

## 2018-09-25 DIAGNOSIS — Z09 Encounter for follow-up examination after completed treatment for conditions other than malignant neoplasm: Secondary | ICD-10-CM

## 2018-09-25 DIAGNOSIS — R600 Localized edema: Secondary | ICD-10-CM

## 2018-09-25 LAB — POCT GLYCOSYLATED HEMOGLOBIN (HGB A1C): Hemoglobin A1C: 6.1 % — AB (ref 4.0–5.6)

## 2018-09-25 MED ORDER — NAPROXEN 500 MG PO TABS: 500.0000 mg | ORAL_TABLET | Freq: Two times a day (BID) | ORAL | 3 refills | Status: DC

## 2018-09-25 MED ORDER — LISINOPRIL 2.5 MG PO TABS: 2.5000 mg | ORAL_TABLET | Freq: Every day | ORAL | 6 refills | Status: DC

## 2018-09-25 MED ORDER — GABAPENTIN 300 MG PO CAPS: 300.0000 mg | ORAL_CAPSULE | Freq: Two times a day (BID) | ORAL | 6 refills | Status: DC

## 2018-09-25 NOTE — Progress Notes (Signed)
Follow Up  Subjective:    Patient ID: Virginia [name], female    DOB: June 22, 1961, 57 y.o.   MRN: [id number]   Chief Complaint  Patient presents with  . Follow-up    chronic condition     HPI  Mr. Timko is a 57 female with a past medical history of Thyroid Nodule, STEMI, SOB, Hypertension, Hyperlipidemia, GERD, Diabetes, CHF, CAD, and Anemia. She is here today for follow up.   Current Status: Since her last office visit, she is doing well with no complaints. She eports bilateral flank pain. She denies urinary frequency, discharge, dysuria, urinary itching, burning, odor, hematuria, and suprapubic pain/discomfort. No reports of GI problems such as nausea, vomiting, diarrhea, and constipation. She has no reports of blood in stools, and hematuria.She denies visual changes, chest pain, cough, shortness of breath, heart palpitations, and falls. She has occasionally headaches and dizziness with position changes. Denies severe headaches, confusion, seizures, double vision, and blurred vision, nausea and vomiting.  She denies fevers, chills, recent infections, weight loss, and night sweats. She has not had any headaches, visual changes, dizziness, and falls. No chest pain, heart palpitations, cough and shortness of breath reported.  No depression or anxiety reported.   Review of Systems  Constitutional: Negative.   HENT: Negative.   Eyes: Negative.   Respiratory: Negative.   Cardiovascular: Negative.   Gastrointestinal: Negative.   Genitourinary: Positive for flank pain (bilateral).  Musculoskeletal: Positive for arthralgias (Generalized).  Neurological: Positive for dizziness and headaches.  Hematological: Negative.   Psychiatric/Behavioral: Negative.    Objective:   Physical Exam  Constitutional: She is oriented to person, place, and time. She appears well-developed and well-nourished.  HENT:  Head: Normocephalic and atraumatic.  Eyes: Pupils are equal, round, and reactive to light.  Conjunctivae and EOM are normal.  Neck: Normal range of motion. Neck supple.  Cardiovascular: Normal rate, regular rhythm, normal heart sounds and intact distal pulses.  Pulmonary/Chest: Effort normal and breath sounds normal.  Abdominal: Soft. Bowel sounds are normal.  Musculoskeletal: Normal range of motion.  Neurological: She is alert and oriented to person, place, and time.  Skin: Skin is warm and dry.  Psychiatric: She has a normal mood and affect. Her behavior is normal. Judgment and thought content normal.  Nursing note and vitals reviewed.     Assessment & Plan:   1. Controlled type 2 diabetes mellitus without complication, without long-term current use of insulin (HCC) Hgb A1c is mildly elevated at 6.1 today, from 5.9 on 03/14/2018. Continue Metformin as prescribed. She will continue to decrease foods/beverages high in sugars and carbs and follow Heart Healthy or DASH diet. Increase physical activity to at least 30 minutes cardio exercise daily.  - POCT glycosylated hemoglobin (Hb A1C) - lisinopril (PRINIVIL,ZESTRIL) 2.5 MG tablet; Take 1 tablet (2.5 mg total) by mouth daily.  Dispense: 30 tablet; Refill: 6  2. Encounter for diabetic foot exam ([facility]) Negative. Foot exam tolerated well. No decreased sensitivity noted upon foot exam. Patient counseled on proper foot hygiene. She is encouraged to exam feet often (daily), using mirror if necessary; keep feet clean and dry (especially between toes), keep feet moistened, wear cotton socks, and avoid wearing open-toed shoes, high-heel shoes, and sandals. Patient verbalized understanding.  - HM Diabetes Foot Exam  3. Bilateral lower extremity edema  4. Neuropathy - gabapentin (NEURONTIN) 300 MG capsule; Take 1 capsule (300 mg total) by mouth 2 (two) times daily.  Dispense: 60 capsule; Refill: 6  5. Essential hypertension  Antihypertensive medications are effective. Continue Isosorbide, Lisinopril, and Metoprolol as prescribed. She will  continue to decrease high sodium intake, excessive alcohol intake, increase potassium intake, smoking cessation, and increase physical activity of at least 30 minutes of cardio activity daily. She will continue to follow Heart Healthy or DASH diet. - lisinopril (PRINIVIL,ZESTRIL) 2.5 MG tablet; Take 1 tablet (2.5 mg total) by mouth daily.  Dispense: 30 tablet; Refill: 6  6. Menopausal hot flushes Stable. Not worsening. Continue Gabapentin as prescribed.   7. Hyperlipidemia with target LDL less than 70 Improved. LDL at 72 on 12/14/2017, from 171 on 06/17/2015. We will continue to monitor. Continue Atorvastatin as prescribed. Continue a low-fat diet.  8. Follow up She will follow up in 6 months.   Meds ordered this encounter  Medications  . gabapentin (NEURONTIN) 300 MG capsule    Sig: Take 1 capsule (300 mg total) by mouth 2 (two) times daily.    Dispense:  60 capsule    Refill:  6  . lisinopril (PRINIVIL,ZESTRIL) 2.5 MG tablet    Sig: Take 1 tablet (2.5 mg total) by mouth daily.    Dispense:  30 tablet    Refill:  6  . naproxen (NAPROSYN) 500 MG tablet    Sig: Take 1 tablet (500 mg total) by mouth 2 (two) times daily.    Dispense:  30 tablet    Refill:  [place],  MSN, FNP-C [facility] [address] [place], [place] [address] [phone]

## 2018-10-29 MED FILL — OMEPRAZOLE 20 MG CAP: 20 | 30 days supply | Qty: 30 | Fill #4

## 2018-10-29 MED FILL — ISOSORBIDE MN ER 30 MG TAB: 30 | 30 days supply | Qty: 15 | Fill #5

## 2018-10-29 MED FILL — METOPROLOL TARTRATE 25 MG T: 25 | 30 days supply | Qty: 30 | Fill #7

## 2018-10-29 MED FILL — metFORMIN HCL 500 MG TABS: 500 | 30 days supply | Qty: 30 | Fill #3

## 2018-10-29 MED FILL — LISINOPRIL 2.5 MG TABLET: 2.5 | 30 days supply | Qty: 30 | Fill #0

## 2018-12-12 MED FILL — ISOSORBIDE MN ER 30 MG TAB: 30 | 30 days supply | Qty: 15 | Fill #6

## 2018-12-12 MED FILL — OMEPRAZOLE 20 MG CAP: 20 | 30 days supply | Qty: 30 | Fill #5

## 2018-12-12 MED FILL — METOPROLOL TARTRATE 25 MG T: 25 | 30 days supply | Qty: 30 | Fill #8

## 2018-12-12 MED FILL — LISINOPRIL 2.5 MG TABLET: 2.5 | 30 days supply | Qty: 30 | Fill #1

## 2018-12-12 MED FILL — metFORMIN HCL 500 MG TABS: 500 | 30 days supply | Qty: 30 | Fill #4

## 2019-03-26 ENCOUNTER — Encounter: Payer: Self-pay | Admitting: Family Medicine

## 2019-03-26 ENCOUNTER — Ambulatory Visit (INDEPENDENT_AMBULATORY_CARE_PROVIDER_SITE_OTHER): Payer: Self-pay | Admitting: Family Medicine

## 2019-03-26 ENCOUNTER — Other Ambulatory Visit: Payer: Self-pay

## 2019-03-26 VITALS — BP 104/64 | HR 74 | Temp 97.9°F | Ht 66.0 in | Wt 178.4 lb

## 2019-03-26 DIAGNOSIS — K219 Gastro-esophageal reflux disease without esophagitis: Secondary | ICD-10-CM

## 2019-03-26 DIAGNOSIS — R200 Anesthesia of skin: Secondary | ICD-10-CM

## 2019-03-26 DIAGNOSIS — Z09 Encounter for follow-up examination after completed treatment for conditions other than malignant neoplasm: Secondary | ICD-10-CM

## 2019-03-26 DIAGNOSIS — E119 Type 2 diabetes mellitus without complications: Secondary | ICD-10-CM

## 2019-03-26 DIAGNOSIS — H6690 Otitis media, unspecified, unspecified ear: Secondary | ICD-10-CM | POA: Insufficient documentation

## 2019-03-26 DIAGNOSIS — I10 Essential (primary) hypertension: Secondary | ICD-10-CM

## 2019-03-26 DIAGNOSIS — E785 Hyperlipidemia, unspecified: Secondary | ICD-10-CM

## 2019-03-26 DIAGNOSIS — G629 Polyneuropathy, unspecified: Secondary | ICD-10-CM | POA: Insufficient documentation

## 2019-03-26 DIAGNOSIS — N951 Menopausal and female climacteric states: Secondary | ICD-10-CM

## 2019-03-26 DIAGNOSIS — R8290 Unspecified abnormal findings in urine: Secondary | ICD-10-CM

## 2019-03-26 LAB — POCT URINALYSIS DIP (MANUAL ENTRY)
Bilirubin, UA: NEGATIVE
Glucose, UA: NEGATIVE mg/dL
Ketones, POC UA: NEGATIVE mg/dL
Leukocytes, UA: NEGATIVE
Nitrite, UA: NEGATIVE
Protein Ur, POC: NEGATIVE mg/dL
Spec Grav, UA: 1.015 (ref 1.010–1.025)
Urobilinogen, UA: 0.2 E.U./dL
pH, UA: 6 (ref 5.0–8.0)

## 2019-03-26 LAB — POCT GLYCOSYLATED HEMOGLOBIN (HGB A1C): Hemoglobin A1C: 6.2 % — AB (ref 4.0–5.6)

## 2019-03-26 MED ORDER — ESTRADIOL 1 MG PO TABS: 1.0000 mg | ORAL_TABLET | Freq: Every day | ORAL | 6 refills | Status: DC

## 2019-03-26 MED ORDER — ASPIRIN EC 81 MG PO TBEC: 81.0000 mg | DELAYED_RELEASE_TABLET | Freq: Every day | ORAL | 6 refills | Status: DC

## 2019-03-26 MED ORDER — METOPROLOL TARTRATE 25 MG PO TABS: 12.5000 mg | ORAL_TABLET | Freq: Two times a day (BID) | ORAL | 3 refills | Status: DC

## 2019-03-26 MED ORDER — GABAPENTIN 300 MG PO CAPS: 300.0000 mg | ORAL_CAPSULE | Freq: Two times a day (BID) | ORAL | 6 refills | Status: DC

## 2019-03-26 MED ORDER — ACETAMINOPHEN 325 MG PO TABS: 650.0000 mg | ORAL_TABLET | ORAL | Status: DC | PRN

## 2019-03-26 MED ORDER — FERROUS SULFATE 325 (65 FE) MG PO TABS: 325.0000 mg | ORAL_TABLET | Freq: Every day | ORAL | 6 refills | Status: DC

## 2019-03-26 MED ORDER — VITAMIN B-12 1000 MCG PO TABS: 1000.0000 ug | ORAL_TABLET | Freq: Every day | ORAL | 1 refills | Status: DC

## 2019-03-26 MED ORDER — NITROGLYCERIN 0.4 MG SL SUBL: 0.4000 mg | SUBLINGUAL_TABLET | SUBLINGUAL | 2 refills | Status: DC | PRN

## 2019-03-26 MED ORDER — ATORVASTATIN CALCIUM 40 MG PO TABS: 40.0000 mg | ORAL_TABLET | Freq: Every day | ORAL | 3 refills | Status: DC

## 2019-03-26 MED ORDER — ISOSORBIDE MONONITRATE ER 30 MG PO TB24: 15.0000 mg | ORAL_TABLET | Freq: Every day | ORAL | 1 refills | Status: DC

## 2019-03-26 MED ORDER — LISINOPRIL 2.5 MG PO TABS: 2.5000 mg | ORAL_TABLET | Freq: Every day | ORAL | 1 refills | Status: DC

## 2019-03-26 MED ORDER — AMOXICILLIN-POT CLAVULANATE 875-125 MG PO TABS: 1.0000 | ORAL_TABLET | Freq: Two times a day (BID) | ORAL | 0 refills | Status: AC

## 2019-03-26 MED ORDER — METFORMIN HCL 500 MG PO TABS: 500.0000 mg | ORAL_TABLET | Freq: Every day | ORAL | 1 refills | Status: DC

## 2019-03-26 MED ORDER — NAPROXEN 500 MG PO TABS: 500.0000 mg | ORAL_TABLET | Freq: Two times a day (BID) | ORAL | 6 refills | Status: DC

## 2019-03-26 MED ORDER — OMEPRAZOLE 20 MG PO CPDR: 20.0000 mg | DELAYED_RELEASE_CAPSULE | Freq: Every day | ORAL | 1 refills | Status: DC

## 2019-03-26 MED FILL — ISOSORBIDE MN ER 30 MG TAB: 30 | 30 days supply | Qty: 15 | Fill #7

## 2019-03-26 MED FILL — ATORVASTATIN CALCIUM 40 MG: 40 | 30 days supply | Qty: 30 | Fill #0

## 2019-03-26 MED FILL — NAPROXEN 500 MG TABLET: 500 | 15 days supply | Qty: 30 | Fill #0

## 2019-03-26 MED FILL — METOPROLOL TARTRATE 25 MG T: 25 | 30 days supply | Qty: 30 | Fill #0

## 2019-03-26 MED FILL — metFORMIN HCL 500 MG TABS: 500 | 30 days supply | Qty: 30 | Fill #5

## 2019-03-26 MED FILL — LISINOPRIL 2.5 MG TABLET: 2.5 | 30 days supply | Qty: 30 | Fill #2

## 2019-03-26 MED FILL — OMEPRAZOLE 20 MG CAP: 20 | 30 days supply | Qty: 30 | Fill #0

## 2019-03-26 MED FILL — AMOX-CLAV 875-125 MG TABLET: 875-125 | 7 days supply | Qty: 14 | Fill #0

## 2019-03-26 MED FILL — NITROGLYCERIN 0.4 MG TAB SL: 0.4 | 20 days supply | Qty: 25 | Fill #0

## 2019-03-26 MED FILL — GABAPENTIN 300 MG CAPSULE: 300 | 30 days supply | Qty: 60 | Fill #0

## 2019-03-26 NOTE — Progress Notes (Signed)
Patient [facility]   Established Patient Office Visit  Subjective:  Patient ID: Virginia [name], female    DOB: 1961-06-04  Age: 58 y.o. MRN: [id number]  CC:  Chief Complaint  Patient presents with  . Follow-up    chronic condtions  . Ear Pain    right x 2 weeks     HPI DENAISHA SWANGO is a 58 year old female who presents for Follow Up today.   Past Medical History:  Diagnosis Date  . Anemia   . CAD (coronary artery disease)    a. 06/2015 BMS x2 to LCx. b. recurrent CP, cath 09/18/2015 patent stent, tiny diag stenosis medical therapy.   . CHF (congestive heart failure) ([place])   . Diabetes mellitus without complication ([place])   . GERD (gastroesophageal reflux disease)    ?  Marland Kitchen Hyperlipidemia with target LDL less than 70   . Hypertension   . Shortness of breath dyspnea    with exertion  . STEMI (ST elevation myocardial infarction) ([place]) 06/16/2015   BMS x 2 CFX  . Thyroid nodule    benign   Current Status: Since her last office visit, she is doing well with no complaints. She has been having right ear pain X 2 weeks now. She has used ear tweezers to remove earwax and placed sweet oil drops, with no relief. She states that hearing in that ear is muffled.   She denies fevers, chills, fatigue, recent infections, weight loss, and night sweats. She has not had any headaches, visual changes, dizziness, and falls. No chest pain, heart palpitations, cough and shortness of breath reported. No reports of GI problems such as nausea, vomiting, diarrhea, and constipation. She has no reports of blood in stools, dysuria and hematuria. No depression or anxiety reported.   Past Surgical History:  Procedure Laterality Date  . ABDOMINAL HYSTERECTOMY N/A 02/02/2016   Procedure: HYSTERECTOMY ABDOMINAL WITH BILATERAL SALPINGECTOMY;  Surgeon: Woodroe Mode, MD;  Location: [facility];  Service: Gynecology;  Laterality: N/A;  . APPENDECTOMY    . CARDIAC  CATHETERIZATION N/A 06/17/2015   Procedure: Left Heart Cath and Coronary Angiography;  Surgeon: Jettie Booze, MD;  Location: [facility];  Service: Cardiovascular;  Laterality: N/A;  . CARDIAC CATHETERIZATION  06/17/2015   Procedure: Coronary Stent Intervention;  Surgeon: Jettie Booze, MD;  Location: [facility];  Service: Cardiovascular;;  . CARDIAC CATHETERIZATION N/A 09/18/2015   Procedure: Left Heart Cath and Coronary Angiography;  Surgeon: Troy Sine, MD;  Location: [facility];  Service: Cardiovascular;  Laterality: N/A;  . CESAREAN SECTION     Myomectomy done during cesarean section  . COLONOSCOPY N/A 09/20/2015   Procedure: COLONOSCOPY;  Surgeon: Carol Ada, MD;  Location: [facility];  Service: Endoscopy;  Laterality: N/A;  . COLONOSCOPY    . ESOPHAGOGASTRODUODENOSCOPY N/A 09/20/2015   Procedure: ESOPHAGOGASTRODUODENOSCOPY (EGD);  Surgeon: Carol Ada, MD;  Location: [facility];  Service: Endoscopy;  Laterality: N/A;  . olympus  quick clip pro gastric body  10/09/15    Family History  Problem Relation Age of Onset  . Stroke Mother   . Heart disease Mother   . Diabetes Mother   . Cancer Father   . Hypertension Sister   . Hyperlipidemia Sister   . Heart attack Brother   . Hypertension Brother   . Heart attack Maternal Uncle   . Asthma Paternal Uncle   . Diabetes Paternal  Uncle   . Diabetes Paternal Aunt   . Thyroid disease Neg Hx     Social History   Socioeconomic History  . Marital status: Married    Spouse name: Not on file  . Number of children: Not on file  . Years of education: Not on file  . Highest education level: Not on file  Occupational History  . Not on file  Social Needs  . Financial resource strain: Not on file  . Food insecurity:    Worry: Not on file    Inability: Not on file  . Transportation needs:    Medical: Not on file    Non-medical: Not on file  Tobacco Use  . Smoking status: Never Smoker  .  Smokeless tobacco: Never Used  Substance and Sexual Activity  . Alcohol use: No    Alcohol/week: 0.0 standard drinks  . Drug use: No  . Sexual activity: Never    Birth control/protection: Post-menopausal  Lifestyle  . Physical activity:    Days per week: Not on file    Minutes per session: Not on file  . Stress: Not on file  Relationships  . Social connections:    Talks on phone: Not on file    Gets together: Not on file    Attends religious service: Not on file    Active member of club or organization: Not on file    Attends meetings of clubs or organizations: Not on file    Relationship status: Not on file  . Intimate partner violence:    Fear of current or ex partner: Not on file    Emotionally abused: Not on file    Physically abused: Not on file    Forced sexual activity: Not on file  Other Topics Concern  . Not on file  Social History Narrative  . Not on file    Outpatient Medications Prior to Visit  Medication Sig Dispense Refill  . acetaminophen (TYLENOL) 325 MG tablet Take 2 tablets (650 mg total) by mouth every 4 (four) hours as needed for mild pain.    Marland Kitchen aspirin EC 81 MG tablet Take 81 mg by mouth daily.    Marland Kitchen atorvastatin (LIPITOR) 40 MG tablet Take 1 tablet (40 mg total) by mouth daily. 90 tablet 3  . estradiol (ESTRACE) 1 MG tablet TAKE 1 TABLET BY MOUTH DAILY 30 tablet 6  . FEROSUL 325 (65 Fe) MG tablet TAKE 1 TABLET BY MOUTH DAILY WITH BREAKFAST. 30 tablet 5  . gabapentin (NEURONTIN) 300 MG capsule Take 1 capsule (300 mg total) by mouth 2 (two) times daily. 60 capsule 6  . isosorbide mononitrate (IMDUR) 30 MG 24 hr tablet TAKE 1/2 TABLET BY MOUTH DAILY. 30 tablet 5  . latanoprost (XALATAN) 0.005 % ophthalmic solution Place 1 drop into both eyes at bedtime.  6  . lisinopril (PRINIVIL,ZESTRIL) 2.5 MG tablet Take 1 tablet (2.5 mg total) by mouth daily. 30 tablet 6  . metFORMIN (GLUCOPHAGE) 500 MG tablet Take 1 tablet (500 mg total) by mouth daily with breakfast.  90 tablet 3  . metoprolol tartrate (LOPRESSOR) 25 MG tablet Take 0.5 tablets (12.5 mg total) by mouth 2 (two) times daily. 180 tablet 3  . naproxen (NAPROSYN) 500 MG tablet Take 1 tablet (500 mg total) by mouth 2 (two) times daily. 30 tablet 3  . nitroGLYCERIN (NITROSTAT) 0.4 MG SL tablet Place 1 tablet (0.4 mg total) under the tongue every 5 (five) minutes as needed for chest pain. 25 tablet  2  . omeprazole (PRILOSEC) 20 MG capsule TAKE 1 CAPSULE BY MOUTH DAILY. 30 capsule 5  . vitamin B-12 (CYANOCOBALAMIN) 1000 MCG tablet Take 1 tablet (1,000 mcg total) by mouth daily. 90 tablet 3   No facility-administered medications prior to visit.     No Known Allergies  ROS Review of Systems  Constitutional: Negative.   HENT: Positive for ear pain (right ).   Eyes: Negative.   Respiratory: Negative.   Cardiovascular: Negative.   Gastrointestinal: Negative.   Endocrine: Negative.   Genitourinary: Negative.   Musculoskeletal: Negative.   Skin: Negative.   Allergic/Immunologic: Negative.   Neurological: Positive for dizziness and headaches.  Hematological: Negative.   Psychiatric/Behavioral: Negative.     Objective:    Physical Exam  Constitutional: She is oriented to person, place, and time. She appears well-developed and well-nourished.  HENT:  Right ear erythema, mild swelling, and tenderness.   Eyes: Conjunctivae and EOM are normal.  Neck: Normal range of motion. Neck supple.  Cardiovascular: Normal rate, regular rhythm, normal heart sounds and intact distal pulses.  Pulmonary/Chest: Effort normal and breath sounds normal.  Abdominal: Soft. Bowel sounds are normal.  Musculoskeletal: Normal range of motion.  Neurological: She is alert and oriented to person, place, and time. She has normal reflexes.  Skin: Skin is warm and dry.  Psychiatric: She has a normal mood and affect. Her behavior is normal. Judgment and thought content normal.    BP 104/64 (BP Location: Left Arm, Patient  Position: Sitting, Cuff Size: Small)   Pulse 74   Temp 97.9 F (36.6 C) (Oral)   Ht 5\' 6"  (1.676 m)   Wt 178 lb 6.4 oz (80.9 kg)   LMP 12/29/2015   SpO2 98%   BMI 28.79 kg/m  Wt Readings from Last 3 Encounters:  03/26/19 178 lb 6.4 oz (80.9 kg)  09/25/18 189 lb 3.2 oz (85.8 kg)  05/30/18 186 lb (84.4 kg)     Health Maintenance Due  Topic Date Due  . MAMMOGRAM  05/19/2018  . PAP SMEAR-Modifier  10/15/2018  . OPHTHALMOLOGY EXAM  03/22/2019  . HEMOGLOBIN A1C  03/26/2019    There are no preventive care reminders to display for this patient.  Lab Results  Component Value Date   TSH 0.70 12/14/2016   Lab Results  Component Value Date   WBC 6.1 11/17/2017   HGB 13.5 11/17/2017   HCT 43.2 11/17/2017   MCV 94.1 11/17/2017   PLT 165 11/17/2017   Lab Results  Component Value Date   NA 140 03/14/2018   K 4.3 03/14/2018   CO2 24 03/14/2018   GLUCOSE 97 03/14/2018   BUN 16 03/14/2018   CREATININE 1.00 03/14/2018   BILITOT 0.4 12/14/2016   ALKPHOS 86 12/14/2016   AST 25 12/14/2016   ALT 22 12/14/2016   PROT 7.0 12/14/2016   ALBUMIN 4.1 12/14/2016   CALCIUM 9.2 03/14/2018   ANIONGAP 8 11/17/2017   GFR 73.08 07/29/2015   Lab Results  Component Value Date   CHOL 141 12/14/2017   Lab Results  Component Value Date   HDL 60 12/14/2017   Lab Results  Component Value Date   LDLCALC 72 12/14/2017   Lab Results  Component Value Date   TRIG 47 12/14/2017   Lab Results  Component Value Date   CHOLHDL 2.4 12/14/2017   Lab Results  Component Value Date   HGBA1C 6.2 (A) 03/26/2019   Assessment & Plan:   1. Acute otitis media, unspecified otitis  media type We will initiate antibiotic today. Patient is to return to office if symptoms do not improve or worsen.  - amoxicillin-clavulanate (AUGMENTIN) 875-125 MG tablet; Take 1 tablet by mouth 2 (two) times daily for 7 days.  Dispense: 14 tablet; Refill: 0  2. Controlled type 2 diabetes mellitus without complication,  without long-term current use of insulin (HCC) The current medical regimen is effective; Hgb A1c is stable at 6.2 today; continue present plan and medications as prescribed. She will continue to decrease foods/beverages high in sugars and carbs and follow Heart Healthy or DASH diet. Increase physical activity to at least 30 minutes cardio exercise daily.  - POCT glycosylated hemoglobin (Hb A1C) - POCT urinalysis dipstick - ferrous sulfate (FEROSUL) 325 (65 FE) MG tablet; Take 1 tablet (325 mg total) by mouth daily with breakfast.  Dispense: 30 tablet; Refill: 6 - lisinopril (ZESTRIL) 2.5 MG tablet; Take 1 tablet (2.5 mg total) by mouth daily.  Dispense: 90 tablet; Refill: 1 - metFORMIN (GLUCOPHAGE) 500 MG tablet; Take 1 tablet (500 mg total) by mouth daily with breakfast.  Dispense: 90 tablet; Refill: 1 - vitamin B-12 (CYANOCOBALAMIN) 1000 MCG tablet; Take 1 tablet (1,000 mcg total) by mouth daily.  Dispense: 90 tablet; Refill: 1  3. Menopausal hot flushes She continues to take oral estrogen sparely; every few days. We will continue to monitor.  - estradiol (ESTRACE) 1 MG tablet; Take 1 tablet (1 mg total) by mouth daily.  Dispense: 30 tablet; Refill: 6  4. Neuropathy - acetaminophen (TYLENOL) 325 MG tablet; Take 2 tablets (650 mg total) by mouth every 4 (four) hours as needed for mild pain. - aspirin EC 81 MG tablet; Take 1 tablet (81 mg total) by mouth daily.  Dispense: 30 tablet; Refill: 6 - gabapentin (NEURONTIN) 300 MG capsule; Take 1 capsule (300 mg total) by mouth 2 (two) times daily.  Dispense: 60 capsule; Refill: 6 - naproxen (NAPROSYN) 500 MG tablet; Take 1 tablet (500 mg total) by mouth 2 (two) times daily.  Dispense: 30 tablet; Refill: 6  5. Essential hypertension The current medical regimen is effective; blood pressure is stable at 104/64 today; continue present plan and medications as prescribed.  She will continue to decrease high sodium intake, excessive alcohol intake, increase  potassium intake, smoking cessation, and increase physical activity of at least 30 minutes of cardio activity daily. She will continue to follow Heart Healthy or DASH diet. - isosorbide mononitrate (IMDUR) 30 MG 24 hr tablet; Take 0.5 tablets (15 mg total) by mouth daily.  Dispense: 90 tablet; Refill: 1 - lisinopril (ZESTRIL) 2.5 MG tablet; Take 1 tablet (2.5 mg total) by mouth daily.  Dispense: 90 tablet; Refill: 1 - metoprolol tartrate (LOPRESSOR) 25 MG tablet; Take 0.5 tablets (12.5 mg total) by mouth 2 (two) times daily.  Dispense: 180 tablet; Refill: 3 - nitroGLYCERIN (NITROSTAT) 0.4 MG SL tablet; Place 1 tablet (0.4 mg total) under the tongue every 5 (five) minutes as needed for chest pain.  Dispense: 25 tablet; Refill: 2  6. Gastroesophageal reflux disease without esophagitis - omeprazole (PRILOSEC) 20 MG capsule; Take 1 capsule (20 mg total) by mouth daily.  Dispense: 90 capsule; Refill: 1  7. Numbness of anterior thigh Stable. Continue Vitamin B-12 as prescribed. - vitamin B-12 (CYANOCOBALAMIN) 1000 MCG tablet; Take 1 tablet (1,000 mcg total) by mouth daily.  Dispense: 90 tablet; Refill: 1  8. Hyperlipidemia with target LDL less than 70 - atorvastatin (LIPITOR) 40 MG tablet; Take 1 tablet (40 mg total)  by mouth daily.  Dispense: 90 tablet; Refill: 3  9. Abnormal urinalysis Results are pending.  - Urine Culture  10. Follow up She will follow up in 6 months.    Meds ordered this encounter  Medications  . amoxicillin-clavulanate (AUGMENTIN) 875-125 MG tablet    Sig: Take 1 tablet by mouth 2 (two) times daily for 7 days.    Dispense:  14 tablet    Refill:  0  . acetaminophen (TYLENOL) 325 MG tablet    Sig: Take 2 tablets (650 mg total) by mouth every 4 (four) hours as needed for mild pain.  Marland Kitchen aspirin EC 81 MG tablet    Sig: Take 1 tablet (81 mg total) by mouth daily.    Dispense:  30 tablet    Refill:  6  . atorvastatin (LIPITOR) 40 MG tablet    Sig: Take 1 tablet (40 mg  total) by mouth daily.    Dispense:  90 tablet    Refill:  3  . estradiol (ESTRACE) 1 MG tablet    Sig: Take 1 tablet (1 mg total) by mouth daily.    Dispense:  30 tablet    Refill:  6  . ferrous sulfate (FEROSUL) 325 (65 FE) MG tablet    Sig: Take 1 tablet (325 mg total) by mouth daily with breakfast.    Dispense:  30 tablet    Refill:  6  . gabapentin (NEURONTIN) 300 MG capsule    Sig: Take 1 capsule (300 mg total) by mouth 2 (two) times daily.    Dispense:  60 capsule    Refill:  6  . isosorbide mononitrate (IMDUR) 30 MG 24 hr tablet    Sig: Take 0.5 tablets (15 mg total) by mouth daily.    Dispense:  90 tablet    Refill:  1  . lisinopril (ZESTRIL) 2.5 MG tablet    Sig: Take 1 tablet (2.5 mg total) by mouth daily.    Dispense:  90 tablet    Refill:  1  . metFORMIN (GLUCOPHAGE) 500 MG tablet    Sig: Take 1 tablet (500 mg total) by mouth daily with breakfast.    Dispense:  90 tablet    Refill:  1  . metoprolol tartrate (LOPRESSOR) 25 MG tablet    Sig: Take 0.5 tablets (12.5 mg total) by mouth 2 (two) times daily.    Dispense:  180 tablet    Refill:  3  . naproxen (NAPROSYN) 500 MG tablet    Sig: Take 1 tablet (500 mg total) by mouth 2 (two) times daily.    Dispense:  30 tablet    Refill:  6  . nitroGLYCERIN (NITROSTAT) 0.4 MG SL tablet    Sig: Place 1 tablet (0.4 mg total) under the tongue every 5 (five) minutes as needed for chest pain.    Dispense:  25 tablet    Refill:  2  . omeprazole (PRILOSEC) 20 MG capsule    Sig: Take 1 capsule (20 mg total) by mouth daily.    Dispense:  90 capsule    Refill:  1  . vitamin B-12 (CYANOCOBALAMIN) 1000 MCG tablet    Sig: Take 1 tablet (1,000 mcg total) by mouth daily.    Dispense:  90 tablet    Refill:  1    Orders Placed This Encounter  Procedures  . Urine Culture  . POCT glycosylated hemoglobin (Hb A1C)  . POCT urinalysis dipstick    Referral Orders  No referral(s) requested today  Kathe Becton,  MSN, FNP-C  [facility] [place], [place] [phone]   Problem List Items Addressed This Visit      Cardiovascular and Mediastinum   Essential hypertension   Relevant Medications   aspirin EC 81 MG tablet   atorvastatin (LIPITOR) 40 MG tablet   isosorbide mononitrate (IMDUR) 30 MG 24 hr tablet   lisinopril (ZESTRIL) 2.5 MG tablet   metoprolol tartrate (LOPRESSOR) 25 MG tablet   nitroGLYCERIN (NITROSTAT) 0.4 MG SL tablet     Digestive   Gastroesophageal reflux disease without esophagitis   Relevant Medications   omeprazole (PRILOSEC) 20 MG capsule     Endocrine   Controlled type 2 diabetes mellitus without complication, without long-term current use of insulin (HCC)   Relevant Medications   aspirin EC 81 MG tablet   atorvastatin (LIPITOR) 40 MG tablet   ferrous sulfate (FEROSUL) 325 (65 FE) MG tablet   lisinopril (ZESTRIL) 2.5 MG tablet   metFORMIN (GLUCOPHAGE) 500 MG tablet   vitamin B-12 (CYANOCOBALAMIN) 1000 MCG tablet   Other Relevant Orders   POCT glycosylated hemoglobin (Hb A1C) (Completed)   POCT urinalysis dipstick (Completed)     Other   Hyperlipidemia with target LDL less than 70 (Chronic)   Relevant Medications   aspirin EC 81 MG tablet   atorvastatin (LIPITOR) 40 MG tablet   isosorbide mononitrate (IMDUR) 30 MG 24 hr tablet   lisinopril (ZESTRIL) 2.5 MG tablet   metoprolol tartrate (LOPRESSOR) 25 MG tablet   nitroGLYCERIN (NITROSTAT) 0.4 MG SL tablet   Menopausal hot flushes   Relevant Medications   estradiol (ESTRACE) 1 MG tablet   Numbness of anterior thigh   Relevant Medications   vitamin B-12 (CYANOCOBALAMIN) 1000 MCG tablet    Other Visit Diagnoses    Acute otitis media, unspecified otitis media type    -  Primary   Relevant Medications   amoxicillin-clavulanate (AUGMENTIN) 875-125 MG tablet   Neuropathy       Relevant Medications   acetaminophen (TYLENOL) 325 MG tablet   aspirin EC 81 MG  tablet   gabapentin (NEURONTIN) 300 MG capsule   naproxen (NAPROSYN) 500 MG tablet   Abnormal urinalysis       Relevant Orders   Urine Culture   Follow up          Meds ordered this encounter  Medications  . amoxicillin-clavulanate (AUGMENTIN) 875-125 MG tablet    Sig: Take 1 tablet by mouth 2 (two) times daily for 7 days.    Dispense:  14 tablet    Refill:  0  . acetaminophen (TYLENOL) 325 MG tablet    Sig: Take 2 tablets (650 mg total) by mouth every 4 (four) hours as needed for mild pain.  Marland Kitchen aspirin EC 81 MG tablet    Sig: Take 1 tablet (81 mg total) by mouth daily.    Dispense:  30 tablet    Refill:  6  . atorvastatin (LIPITOR) 40 MG tablet    Sig: Take 1 tablet (40 mg total) by mouth daily.    Dispense:  90 tablet    Refill:  3  . estradiol (ESTRACE) 1 MG tablet    Sig: Take 1 tablet (1 mg total) by mouth daily.    Dispense:  30 tablet    Refill:  6  . ferrous sulfate (FEROSUL) 325 (65 FE) MG tablet    Sig: Take 1 tablet (325 mg total) by mouth daily  with breakfast.    Dispense:  30 tablet    Refill:  6  . gabapentin (NEURONTIN) 300 MG capsule    Sig: Take 1 capsule (300 mg total) by mouth 2 (two) times daily.    Dispense:  60 capsule    Refill:  6  . isosorbide mononitrate (IMDUR) 30 MG 24 hr tablet    Sig: Take 0.5 tablets (15 mg total) by mouth daily.    Dispense:  90 tablet    Refill:  1  . lisinopril (ZESTRIL) 2.5 MG tablet    Sig: Take 1 tablet (2.5 mg total) by mouth daily.    Dispense:  90 tablet    Refill:  1  . metFORMIN (GLUCOPHAGE) 500 MG tablet    Sig: Take 1 tablet (500 mg total) by mouth daily with breakfast.    Dispense:  90 tablet    Refill:  1  . metoprolol tartrate (LOPRESSOR) 25 MG tablet    Sig: Take 0.5 tablets (12.5 mg total) by mouth 2 (two) times daily.    Dispense:  180 tablet    Refill:  3  . naproxen (NAPROSYN) 500 MG tablet    Sig: Take 1 tablet (500 mg total) by mouth 2 (two) times daily.    Dispense:  30 tablet    Refill:  6   . nitroGLYCERIN (NITROSTAT) 0.4 MG SL tablet    Sig: Place 1 tablet (0.4 mg total) under the tongue every 5 (five) minutes as needed for chest pain.    Dispense:  25 tablet    Refill:  2  . omeprazole (PRILOSEC) 20 MG capsule    Sig: Take 1 capsule (20 mg total) by mouth daily.    Dispense:  90 capsule    Refill:  1  . vitamin B-12 (CYANOCOBALAMIN) 1000 MCG tablet    Sig: Take 1 tablet (1,000 mcg total) by mouth daily.    Dispense:  90 tablet    Refill:  1    Follow-up: Return in about 6 months (around 09/26/2019).    Azzie Glatter, FNP

## 2019-03-26 NOTE — Patient Instructions (Signed)
Amoxicillin; Clavulanic Acid tablets What is this medicine? AMOXICILLIN; CLAVULANIC ACID (a mox i SIL in; KLAV yoo lan ic AS id) is a penicillin antibiotic. It is used to treat certain kinds of bacterial infections. It will not work for colds, flu, or other viral infections. This medicine may be used for other purposes; ask your health care provider or pharmacist if you have questions. COMMON BRAND NAME(S): Augmentin What should I tell my health care provider before I take this medicine? They need to know if you have any of these conditions: -bowel disease, like colitis -kidney disease -liver disease -mononucleosis -an unusual or allergic reaction to amoxicillin, penicillin, cephalosporin, other antibiotics, clavulanic acid, other medicines, foods, dyes, or preservatives -pregnant or trying to get pregnant -breast-feeding How should I use this medicine? Take this medicine by mouth with a full glass of water. Follow the directions on the prescription label. Take at the start of a meal. Do not crush or chew. If the tablet has a score line, you may cut it in half at the score line for easier swallowing. Take your medicine at regular intervals. Do not take your medicine more often than directed. Take all of your medicine as directed even if you think you are better. Do not skip doses or stop your medicine early. Talk to your pediatrician regarding the use of this medicine in children. Special care may be needed. Overdosage: If you think you have taken too much of this medicine contact a poison control center or emergency room at once. NOTE: This medicine is only for you. Do not share this medicine with others. What if I miss a dose? If you miss a dose, take it as soon as you can. If it is almost time for your next dose, take only that dose. Do not take double or extra doses. What may interact with this medicine? -allopurinol -anticoagulants -birth control pills -methotrexate -probenecid This  list may not describe all possible interactions. Give your health care provider a list of all the medicines, herbs, non-prescription drugs, or dietary supplements you use. Also tell them if you smoke, drink alcohol, or use illegal drugs. Some items may interact with your medicine. What should I watch for while using this medicine? Tell your doctor or health care professional if your symptoms do not improve. Do not treat diarrhea with over the counter products. Contact your doctor if you have diarrhea that lasts more than 2 days or if it is severe and watery. If you have diabetes, you may get a false-positive result for sugar in your urine. Check with your doctor or health care professional. Birth control pills may not work properly while you are taking this medicine. Talk to your doctor about using an extra method of birth control. What side effects may I notice from receiving this medicine? Side effects that you should report to your doctor or health care professional as soon as possible: -allergic reactions like skin rash, itching or hives, swelling of the face, lips, or tongue -breathing problems -dark urine -fever or chills, sore throat -redness, blistering, peeling or loosening of the skin, including inside the mouth -seizures -trouble passing urine or change in the amount of urine -unusual bleeding, bruising -unusually weak or tired -white patches or sores in the mouth or throat Side effects that usually do not require medical attention (report to your doctor or health care professional if they continue or are bothersome): -diarrhea -dizziness -headache -nausea, vomiting -stomach upset -vaginal or anal irritation This list may   not describe all possible side effects. Call your doctor for medical advice about side effects. You may report side effects to FDA at 1-800-FDA-1088. Where should I keep my medicine? Keep out of the reach of children. Store at room temperature below 25 degrees  C (77 degrees F). Keep container tightly closed. Throw away any unused medicine after the expiration date. NOTE: This sheet is a summary. It may not cover all possible information. If you have questions about this medicine, talk to your doctor, pharmacist, or health care provider.  2019 Elsevier/Gold Standard (2008-01-17 12:04:30) Otitis Media, Adult  Otitis media means that the middle ear is red and swollen (inflamed) and full of fluid. The condition usually goes away on its own. Follow these instructions at home:  Take over-the-counter and prescription medicines only as told by your doctor.  If you were prescribed an antibiotic medicine, take it as told by your doctor. Do not stop taking the antibiotic even if you start to feel better.  Keep all follow-up visits as told by your doctor. This is important. Contact a doctor if:  You have bleeding from your nose.  There is a lump on your neck.  You are not getting better in 5 days.  You feel worse instead of better. Get help right away if:  You have pain that is not helped with medicine.  You have swelling, redness, or pain around your ear.  You get a stiff neck.  You cannot move part of your face (paralyzed).  You notice that the bone behind your ear hurts when you touch it.  You get a very bad headache. Summary  Otitis media means that the middle ear is red, swollen, and full of fluid.  This condition usually goes away on its own. In some cases, treatment may be needed.  If you were prescribed an antibiotic medicine, take it as told by your doctor. This information is not intended to replace advice given to you by your health care provider. Make sure you discuss any questions you have with your health care provider. Document Released: 04/11/2008 Document Revised: 11/14/2016 Document Reviewed: 11/14/2016 Elsevier Interactive Patient Education  2019 Elsevier Inc.  

## 2019-03-28 LAB — URINE CULTURE

## 2019-04-27 ENCOUNTER — Emergency Department (HOSPITAL_COMMUNITY): Payer: Self-pay

## 2019-04-27 ENCOUNTER — Other Ambulatory Visit: Payer: Self-pay

## 2019-04-27 ENCOUNTER — Emergency Department (HOSPITAL_COMMUNITY)
Admission: EM | Admit: 2019-04-27 | Discharge: 2019-04-27 | Disposition: A | Discharge status: Home / Self Care | Payer: Self-pay | Attending: Emergency Medicine | Admitting: Emergency Medicine

## 2019-04-27 ENCOUNTER — Encounter (HOSPITAL_COMMUNITY): Payer: Self-pay | Admitting: Emergency Medicine

## 2019-04-27 DIAGNOSIS — Z7984 Long term (current) use of oral hypoglycemic drugs: Secondary | ICD-10-CM | POA: Insufficient documentation

## 2019-04-27 DIAGNOSIS — E119 Type 2 diabetes mellitus without complications: Secondary | ICD-10-CM | POA: Insufficient documentation

## 2019-04-27 DIAGNOSIS — Z7982 Long term (current) use of aspirin: Secondary | ICD-10-CM | POA: Insufficient documentation

## 2019-04-27 DIAGNOSIS — I509 Heart failure, unspecified: Secondary | ICD-10-CM | POA: Insufficient documentation

## 2019-04-27 DIAGNOSIS — Z79899 Other long term (current) drug therapy: Secondary | ICD-10-CM | POA: Insufficient documentation

## 2019-04-27 DIAGNOSIS — I110 Hypertensive heart disease with heart failure: Secondary | ICD-10-CM | POA: Insufficient documentation

## 2019-04-27 DIAGNOSIS — R6884 Jaw pain: Secondary | ICD-10-CM | POA: Insufficient documentation

## 2019-04-27 DIAGNOSIS — I2510 Atherosclerotic heart disease of native coronary artery without angina pectoris: Secondary | ICD-10-CM | POA: Insufficient documentation

## 2019-04-27 DIAGNOSIS — I252 Old myocardial infarction: Secondary | ICD-10-CM | POA: Insufficient documentation

## 2019-04-27 DIAGNOSIS — K029 Dental caries, unspecified: Secondary | ICD-10-CM | POA: Insufficient documentation

## 2019-04-27 LAB — BASIC METABOLIC PANEL
Anion gap: 9 (ref 5–15)
BUN: 9 mg/dL (ref 6–20)
CO2: 24 mmol/L (ref 22–32)
Calcium: 9.2 mg/dL (ref 8.9–10.3)
Chloride: 106 mmol/L (ref 98–111)
Creatinine, Ser: 1.02 mg/dL — ABNORMAL HIGH (ref 0.44–1.00)
GFR calc Af Amer: >60 mL/min (ref >60)
GFR calc non Af Amer: >60 mL/min (ref >60)
Glucose, Bld: 97 mg/dL (ref 70–99)
Potassium: 3.5 mmol/L (ref 3.5–5.1)
Sodium: 139 mmol/L (ref 135–145)

## 2019-04-27 LAB — CBC WITH DIFFERENTIAL/PLATELET
Abs Immature Granulocytes: 0.01 10*3/uL (ref 0.00–0.07)
Basophils Absolute: 0 10*3/uL (ref 0.0–0.1)
Basophils Relative: 1 %
Eosinophils Absolute: 0.3 10*3/uL (ref 0.0–0.5)
Eosinophils Relative: 7 %
HCT: 38.4 % (ref 36.0–46.0)
Hemoglobin: 12.2 g/dL (ref 12.0–15.0)
Immature Granulocytes: 0 %
Lymphocytes Relative: 49 %
Lymphs Abs: 2.1 10*3/uL (ref 0.7–4.0)
MCH: 29.5 pg (ref 26.0–34.0)
MCHC: 31.8 g/dL (ref 30.0–36.0)
MCV: 92.8 fL (ref 80.0–100.0)
Monocytes Absolute: 0.4 10*3/uL (ref 0.1–1.0)
Monocytes Relative: 10 %
Neutro Abs: 1.4 10*3/uL — ABNORMAL LOW (ref 1.7–7.7)
Neutrophils Relative %: 33 %
Platelets: 169 10*3/uL (ref 150–400)
RBC: 4.14 MIL/uL (ref 3.87–5.11)
RDW: 13.7 % (ref 11.5–15.5)
WBC: 4.2 10*3/uL (ref 4.0–10.5)
nRBC: 0 % (ref 0.0–0.2)

## 2019-04-27 MED ORDER — TRAMADOL HCL 50 MG PO TABS: 50.0000 mg | ORAL_TABLET | Freq: Four times a day (QID) | ORAL | 0 refills | Status: DC | PRN

## 2019-04-27 MED ORDER — IOHEXOL 300 MG/ML  SOLN
75.0000 mL | Freq: Once | INTRAMUSCULAR | Status: AC | PRN
  Administered 2019-04-27: 14:00:00 75 mL via INTRAVENOUS

## 2019-04-27 MED ORDER — PENICILLIN V POTASSIUM 500 MG PO TABS: 500.0000 mg | ORAL_TABLET | Freq: Three times a day (TID) | ORAL | 0 refills | Status: DC

## 2019-04-27 MED ORDER — CYCLOBENZAPRINE HCL 10 MG PO TABS: 10.0000 mg | ORAL_TABLET | Freq: Two times a day (BID) | ORAL | 0 refills | Status: DC | PRN

## 2019-04-27 MED ORDER — METHOCARBAMOL 1000 MG/10ML IJ SOLN
500.0000 mg | Freq: Once | INTRAMUSCULAR | Status: AC
  Administered 2019-04-27: 15:00:00 500 mg via INTRAMUSCULAR
  Filled 2019-04-27: qty 5

## 2019-04-27 MED ORDER — METHOCARBAMOL 1000 MG/10ML IJ SOLN
1000.0000 mg | Freq: Once | INTRAMUSCULAR | Status: DC
  Filled 2019-04-27: qty 10

## 2019-04-27 MED ORDER — IBUPROFEN 600 MG PO TABS: 600.0000 mg | ORAL_TABLET | Freq: Four times a day (QID) | ORAL | 0 refills | Status: DC | PRN

## 2019-04-27 NOTE — ED Triage Notes (Signed)
Pt in with c/o bilateral jaw pain. States she was treated for an ear infection 1 mo ago, then 2 nights ago became unable to open jaw/eat. States she feels her tongue is swelling

## 2019-04-27 NOTE — Discharge Instructions (Signed)
You have been evaluated for your jaw pain and tongue swelling.  CT scan did not show any evidence of jaw dislocation.  However, there are potential dental infection causing your symptoms.  Take antibiotic and pain medication as prescribed.  Your thyroid appears abnormal on today's CT scan.  Discuss this with your primary care provider at your earliest convenient and request for thyroid ultrasound for further evaluation.  Return if your condition worsen or if you have other concerns.

## 2019-04-27 NOTE — ED Provider Notes (Signed)
[facility] EMERGENCY DEPARTMENT Provider Note   CSN: [id number] Arrival date & time: 04/27/19  1207     History   Chief Complaint Chief Complaint  Patient presents with  . Jaw Pain  . tongue swelling    HPI Virginia [name] is a 58 y.o. female.     The history is provided by the patient and medical records. No language interpreter was used.     58 year old female with history of hypertension, diabetes, CHF, CAD, prior MI presenting for evaluation of tongue swelling and jaw pain.  Patient report around midnight last night she developed sensation of pain in her jaw, difficulty moving her jaw as well as sensation of tongue swelling.  She endorsed significant pain discomfort to the right side of her jaw, worsening with jaw movement.  She endorsed a grainy sensation to her teeth, having trouble chewing.  She felt her tongue is swollen.  Symptom is moderate in severity.  She recalls 2 weeks ago she was laughing when she fell a pop on the right side of her jaw follows with moderate amount of pain.  She was seen by her PCP and was diagnosed with infection.  She was placed on antibiotic for approximately a week which did help.  She denies any fever chills no active chest pain shortness of breath or neck pain.  She denies any recent injury.  No change in her hearing.  She is up-to-date with tetanus.  She denies sore throat or cough.  Past Medical History:  Diagnosis Date  . Anemia   . CAD (coronary artery disease)    a. 06/2015 BMS x2 to LCx. b. recurrent CP, cath 09/18/2015 patent stent, tiny diag stenosis medical therapy.   . CHF (congestive heart failure) ([place])   . Diabetes mellitus without complication ([place])   . GERD (gastroesophageal reflux disease)    ?  Marland Kitchen Hyperlipidemia with target LDL less than 70   . Hypertension   . Shortness of breath dyspnea    with exertion  . STEMI (ST elevation myocardial infarction) ([place]) 06/16/2015   BMS x 2 CFX  . Thyroid nodule    benign    Patient Active Problem List   Diagnosis Date Noted  . Acute otitis media 03/26/2019  . Neuropathy 03/26/2019  . CAD (coronary artery disease) 12/26/2017  . Old MI (myocardial infarction) 12/26/2017  . Gastroesophageal reflux disease without esophagitis 03/16/2017  . Blood pressure check 12/28/2016  . Bradycardia 12/14/2016  . Controlled type 2 diabetes mellitus without complication, without long-term current use of insulin ([place]) 03/29/2016  . Numbness of anterior thigh 03/29/2016  . Menopausal hot flushes 03/10/2016  . Essential hypertension 12/29/2015  . Fibroid uterus 11/13/2015  . Transient right leg weakness 10/05/2015  . Acute GI bleeding   . Symptomatic anemia 09/19/2015  . Vasovagal near syncope 09/19/2015  . Dysfunctional uterine bleeding 09/19/2015  . Diarrhea due to drug 09/19/2015  . Heme positive stool 09/19/2015  . Chest pain 09/17/2015  . Multinodular goiter 07/09/2015  . CAD S/P CFX BMS (? med complinace) x 2 06/16/15 07/01/2015  . Acute renal insufficiency 07/01/2015  . Abdominal pain 07/01/2015  . Nodular goiter 06/23/2015  . Diabetes mellitus type 2, controlled ([place]) 06/23/2015  . STEMI -06/16/15 06/17/2015  . Hyperlipidemia with target LDL less than 70     Past Surgical History:  Procedure Laterality Date  . ABDOMINAL HYSTERECTOMY N/A 02/02/2016   Procedure: HYSTERECTOMY ABDOMINAL WITH BILATERAL SALPINGECTOMY;  Surgeon: Woodroe Mode,  MD;  Location: [facility];  Service: Gynecology;  Laterality: N/A;  . APPENDECTOMY    . CARDIAC CATHETERIZATION N/A 06/17/2015   Procedure: Left Heart Cath and Coronary Angiography;  Surgeon: Jettie Booze, MD;  Location: [facility];  Service: Cardiovascular;  Laterality: N/A;  . CARDIAC CATHETERIZATION  06/17/2015   Procedure: Coronary Stent Intervention;  Surgeon: Jettie Booze, MD;  Location: [facility];  Service: Cardiovascular;;  . CARDIAC CATHETERIZATION N/A 09/18/2015   Procedure: Left  Heart Cath and Coronary Angiography;  Surgeon: Troy Sine, MD;  Location: [facility];  Service: Cardiovascular;  Laterality: N/A;  . CESAREAN SECTION     Myomectomy done during cesarean section  . COLONOSCOPY N/A 09/20/2015   Procedure: COLONOSCOPY;  Surgeon: Carol Ada, MD;  Location: [facility];  Service: Endoscopy;  Laterality: N/A;  . COLONOSCOPY    . ESOPHAGOGASTRODUODENOSCOPY N/A 09/20/2015   Procedure: ESOPHAGOGASTRODUODENOSCOPY (EGD);  Surgeon: Carol Ada, MD;  Location: [facility];  Service: Endoscopy;  Laterality: N/A;  . olympus  quick clip pro gastric body  10/09/15     OB History    Gravida  2   Para  2   Term  2   Preterm      AB      Living  2     SAB      TAB      Ectopic      Multiple      Live Births               Home Medications    Prior to Admission medications   Medication Sig Start Date End Date Taking? Authorizing Provider  acetaminophen (TYLENOL) 325 MG tablet Take 2 tablets (650 mg total) by mouth every 4 (four) hours as needed for mild pain. 03/26/19   Azzie Glatter, FNP  aspirin EC 81 MG tablet Take 1 tablet (81 mg total) by mouth daily. 03/26/19   Azzie Glatter, FNP  atorvastatin (LIPITOR) 40 MG tablet Take 1 tablet (40 mg total) by mouth daily. 03/26/19 03/20/20  Azzie Glatter, FNP  estradiol (ESTRACE) 1 MG tablet Take 1 tablet (1 mg total) by mouth daily. 03/26/19   Azzie Glatter, FNP  ferrous sulfate (FEROSUL) 325 (65 FE) MG tablet Take 1 tablet (325 mg total) by mouth daily with breakfast. 03/26/19   Azzie Glatter, FNP  gabapentin (NEURONTIN) 300 MG capsule Take 1 capsule (300 mg total) by mouth 2 (two) times daily. 03/26/19   Azzie Glatter, FNP  isosorbide mononitrate (IMDUR) 30 MG 24 hr tablet Take 0.5 tablets (15 mg total) by mouth daily. 03/26/19   Azzie Glatter, FNP  lisinopril (ZESTRIL) 2.5 MG tablet Take 1 tablet (2.5 mg total) by mouth daily. 03/26/19   Azzie Glatter, FNP  metFORMIN  (GLUCOPHAGE) 500 MG tablet Take 1 tablet (500 mg total) by mouth daily with breakfast. 03/26/19   Azzie Glatter, FNP  metoprolol tartrate (LOPRESSOR) 25 MG tablet Take 0.5 tablets (12.5 mg total) by mouth 2 (two) times daily. 03/26/19   Azzie Glatter, FNP  naproxen (NAPROSYN) 500 MG tablet Take 1 tablet (500 mg total) by mouth 2 (two) times daily. 03/26/19   Azzie Glatter, FNP  nitroGLYCERIN (NITROSTAT) 0.4 MG SL tablet Place 1 tablet (0.4 mg total) under the tongue every 5 (five) minutes as needed for chest pain. 03/26/19   Azzie Glatter, FNP  omeprazole (PRILOSEC) 20 MG capsule  Take 1 capsule (20 mg total) by mouth daily. 03/26/19   Azzie Glatter, FNP  vitamin B-12 (CYANOCOBALAMIN) 1000 MCG tablet Take 1 tablet (1,000 mcg total) by mouth daily. 03/26/19   Azzie Glatter, FNP    Family History Family History  Problem Relation Age of Onset  . Stroke Mother   . Heart disease Mother   . Diabetes Mother   . Cancer Father   . Hypertension Sister   . Hyperlipidemia Sister   . Heart attack Brother   . Hypertension Brother   . Heart attack Maternal Uncle   . Asthma Paternal Uncle   . Diabetes Paternal Uncle   . Diabetes Paternal Aunt   . Thyroid disease Neg Hx     Social History Social History   Tobacco Use  . Smoking status: Never Smoker  . Smokeless tobacco: Never Used  Substance Use Topics  . Alcohol use: No    Alcohol/week: 0.0 standard drinks  . Drug use: No     Allergies   Patient has no known allergies.   Review of Systems Review of Systems  All other systems reviewed and are negative.    Physical Exam Updated Vital Signs BP 126/70   Pulse 65   Temp 98.2 F (36.8 C) (Oral)   Wt 80.9 kg   LMP 12/29/2015   SpO2 96%   BMI 28.79 kg/m   Physical Exam Vitals signs and nursing note reviewed.  Constitutional:      General: She is not in acute distress.    Appearance: She is well-developed.  HENT:     Head: Atraumatic.     Right Ear:  Tympanic membrane normal.     Left Ear: Tympanic membrane normal.     Nose: Nose normal.     Mouth/Throat:     Comments: When talking, patient unable to fully open her mouth.  Difficult to understand.  Examination is difficult due to evidence of trismus.  No obvious tongue edema.  Widespread dental decay noted Eyes:     Conjunctiva/sclera: Conjunctivae normal.  Neck:     Musculoskeletal: Neck supple. No neck rigidity.     Vascular: No carotid bruit.     Comments: No thyromegaly Lymphadenopathy:     Cervical: No cervical adenopathy.  Skin:    Findings: No rash.  Neurological:     Mental Status: She is alert.      ED Treatments / Results  Labs (all labs ordered are listed, but only abnormal results are displayed) Labs Reviewed  CBC WITH DIFFERENTIAL/PLATELET - Abnormal; Notable for the following components:      Result Value   Neutro Abs 1.4 (*)    All other components within normal limits  BASIC METABOLIC PANEL - Abnormal; Notable for the following components:   Creatinine, Ser 1.02 (*)    All other components within normal limits    EKG    Radiology Ct Maxillofacial W Contrast  Result Date: 04/27/2019 CLINICAL DATA:  Unable to open jaw. Feels tongue is swollen. EXAM: CT MAXILLOFACIAL WITH CONTRAST TECHNIQUE: Multidetector CT imaging of the maxillofacial structures was performed with intravenous contrast. Multiplanar CT image reconstructions were also generated. CONTRAST:  41mL OMNIPAQUE IOHEXOL 300 MG/ML  SOLN COMPARISON:  Thyroid ultrasound 06/26/2015. FINDINGS: Osseous: No fracture or mandibular dislocation. No destructive process. Poor dentition. Periapical lucencies in the maxilla. Dental caries. TMJs are located. Prominent styloid processes, but insufficient size for diagnosis of Eagle syndrome. Cervical spondylosis C3-C4. Orbits: Negative. No traumatic or inflammatory finding.  Sinuses: Clear. Soft tissues: Incompletely visualized, but abnormal size LEFT lobe thyroid  nodule, 15 x 31, hypoattenuating, central calcification. Visualized tongue base, vallecula, epiglottis, oropharynx, and nasopharynx, unremarkable. No retropharyngeal fluid collection. Limited intracranial: No significant or unexpected finding. IMPRESSION: 1. Poor dentition. Periapical lucencies in the maxilla. No mandibular dislocation. 2. Incompletely visualized, but abnormal size LEFT lobe thyroid nodule, 15 x 26 mm. Recommend further evaluation with thyroid ultrasound. If patient is clinically hyperthyroid, consider nuclear medicine thyroid uptake and scan. 3. No oropharyngeal or hypopharyngeal mass. 4. Prominent styloid processes, but insufficient size for diagnosis of the we will syndrome. Electronically Signed   By: Staci Righter M.D.   On: 04/27/2019 15:14    Procedures Procedures (including critical care time)  Medications Ordered in ED Medications  iohexol (OMNIPAQUE) 300 MG/ML solution 75 mL (75 mLs Intravenous Contrast Given 04/27/19 1411)  methocarbamol (ROBAXIN) injection 500 mg (500 mg Intramuscular Given 04/27/19 1442)     Initial Impression / Assessment and Plan / ED Course  I have reviewed the triage vital signs and the nursing notes.  Pertinent labs & imaging results that were available during my care of the patient were reviewed by me and considered in my medical decision making (see chart for details).        BP 136/79   Pulse 65   Temp 98.2 F (36.8 C) (Oral)   Wt 80.9 kg   LMP 12/29/2015   SpO2 100%   BMI 28.79 kg/m    Final Clinical Impressions(s) / ED Diagnoses   Final diagnoses:  Jaw pain  Dental decay    ED Discharge Orders         Ordered    ibuprofen (ADVIL) 600 MG tablet  Every 6 hours PRN     04/27/19 1645    cyclobenzaprine (FLEXERIL) 10 MG tablet  2 times daily PRN     04/27/19 1645    penicillin v potassium (VEETID) 500 MG tablet  3 times daily     04/27/19 1645         4:35 PM Patient developed atraumatic pain in her jaw on the  right side, sensation that her tongue swelling.  On exam, no obvious signs to suggest malocclusion however she does have poor dentition but no obvious abscess.  Her labs are reassuring, a maxillofacial CT scan demonstrate poor dentition with periapical lucencies in the maxillary but no evidence of mandibular dislocation.  There is an incompletely visualized but abnormal sized left lobe thyroid nodule is recommended for further evaluation with thyroid ultrasound.  Patient does not have any neck discomfort therefore encourage patient to have evaluation of her thyroid outpatient promptly.  I did discussed care with Dr. Ronnald Nian. Pt is UTD with tetanus immunization.  After receiving muscle relaxant, patient states her jaw does feel a bit better.  Plan to discharge home with NSAIDs, muscle relaxant, antibiotic for potential dental infection causing her symptoms.  Patient explained that she should return promptly if her condition worsen.   Domenic Moras, PA-C 04/27/19 [place], [place], MD 04/29/19 [phone]

## 2019-04-27 NOTE — ED Notes (Signed)
Patient verbalizes understanding of discharge instructions. Opportunity for questioning and answers were provided. Armband removed by staff, pt discharged from ED.  

## 2019-06-11 MED FILL — ISOSORBIDE MN ER 30 MG TAB: 30 | 30 days supply | Qty: 15 | Fill #0

## 2019-06-11 MED FILL — METOPROLOL TARTRATE 25 MG T: 25 | 30 days supply | Qty: 30 | Fill #1

## 2019-09-27 ENCOUNTER — Ambulatory Visit (INDEPENDENT_AMBULATORY_CARE_PROVIDER_SITE_OTHER): Payer: Self-pay | Admitting: Family Medicine

## 2019-09-27 ENCOUNTER — Encounter: Payer: Self-pay | Admitting: Family Medicine

## 2019-09-27 ENCOUNTER — Other Ambulatory Visit: Payer: Self-pay

## 2019-09-27 VITALS — BP 136/84 | HR 72 | Temp 98.2°F | Ht 66.0 in | Wt 166.5 lb

## 2019-09-27 DIAGNOSIS — Z09 Encounter for follow-up examination after completed treatment for conditions other than malignant neoplasm: Secondary | ICD-10-CM

## 2019-09-27 DIAGNOSIS — E785 Hyperlipidemia, unspecified: Secondary | ICD-10-CM

## 2019-09-27 DIAGNOSIS — R634 Abnormal weight loss: Secondary | ICD-10-CM

## 2019-09-27 DIAGNOSIS — I10 Essential (primary) hypertension: Secondary | ICD-10-CM

## 2019-09-27 DIAGNOSIS — R7303 Prediabetes: Secondary | ICD-10-CM

## 2019-09-27 DIAGNOSIS — R200 Anesthesia of skin: Secondary | ICD-10-CM

## 2019-09-27 DIAGNOSIS — K219 Gastro-esophageal reflux disease without esophagitis: Secondary | ICD-10-CM

## 2019-09-27 DIAGNOSIS — E119 Type 2 diabetes mellitus without complications: Secondary | ICD-10-CM

## 2019-09-27 DIAGNOSIS — G629 Polyneuropathy, unspecified: Secondary | ICD-10-CM

## 2019-09-27 DIAGNOSIS — Z23 Encounter for immunization: Secondary | ICD-10-CM

## 2019-09-27 DIAGNOSIS — R7309 Other abnormal glucose: Secondary | ICD-10-CM

## 2019-09-27 LAB — POCT GLYCOSYLATED HEMOGLOBIN (HGB A1C): Hemoglobin A1C: 5.7 % — AB (ref 4.0–5.6)

## 2019-09-27 LAB — POCT URINALYSIS DIPSTICK
Bilirubin, UA: NEGATIVE
Glucose, UA: NEGATIVE
Ketones, UA: NEGATIVE
Leukocytes, UA: NEGATIVE
Nitrite, UA: NEGATIVE
Protein, UA: NEGATIVE
Spec Grav, UA: 1.025 (ref 1.010–1.025)
Urobilinogen, UA: 0.2 E.U./dL
pH, UA: 7 (ref 5.0–8.0)

## 2019-09-27 LAB — GLUCOSE, POCT (MANUAL RESULT ENTRY): POC Glucose: 83 mg/dl (ref 70–99)

## 2019-09-27 MED ORDER — NAPROXEN 500 MG PO TABS: 500.0000 mg | ORAL_TABLET | Freq: Two times a day (BID) | ORAL | 6 refills | Status: DC

## 2019-09-27 MED ORDER — OMEPRAZOLE 20 MG PO CPDR: 20.0000 mg | DELAYED_RELEASE_CAPSULE | Freq: Every day | ORAL | 1 refills | Status: DC

## 2019-09-27 MED ORDER — ASPIRIN EC 81 MG PO TBEC: 81.0000 mg | DELAYED_RELEASE_TABLET | Freq: Every day | ORAL | 6 refills | Status: AC

## 2019-09-27 MED ORDER — ISOSORBIDE MONONITRATE ER 30 MG PO TB24: 15.0000 mg | ORAL_TABLET | Freq: Every day | ORAL | 1 refills | Status: DC

## 2019-09-27 MED ORDER — GABAPENTIN 300 MG PO CAPS: 300.0000 mg | ORAL_CAPSULE | Freq: Two times a day (BID) | ORAL | 6 refills | Status: DC

## 2019-09-27 MED ORDER — LISINOPRIL 2.5 MG PO TABS: 2.5000 mg | ORAL_TABLET | Freq: Every day | ORAL | 1 refills | Status: DC

## 2019-09-27 MED ORDER — METOPROLOL TARTRATE 25 MG PO TABS: 12.5000 mg | ORAL_TABLET | Freq: Two times a day (BID) | ORAL | 3 refills | Status: DC

## 2019-09-27 MED ORDER — ATORVASTATIN CALCIUM 40 MG PO TABS: 40.0000 mg | ORAL_TABLET | Freq: Every day | ORAL | 3 refills | Status: DC

## 2019-09-27 MED ORDER — FERROUS SULFATE 325 (65 FE) MG PO TABS: 325.0000 mg | ORAL_TABLET | Freq: Every day | ORAL | 6 refills | Status: DC

## 2019-09-27 MED ORDER — METFORMIN HCL 500 MG PO TABS: 500.0000 mg | ORAL_TABLET | Freq: Every day | ORAL | 1 refills | Status: DC

## 2019-09-27 MED ORDER — VITAMIN B-12 1000 MCG PO TABS: 1000.0000 ug | ORAL_TABLET | Freq: Every day | ORAL | 1 refills | Status: DC

## 2019-09-27 MED ORDER — ACETAMINOPHEN 325 MG PO TABS: 650.0000 mg | ORAL_TABLET | ORAL | Status: DC | PRN

## 2019-09-27 MED FILL — GABAPENTIN 300 MG CAPSULE: 300 | 30 days supply | Qty: 60 | Fill #0

## 2019-09-27 MED FILL — FERROUS SULFATE 325 MG TAB: 325 (65 FE) | 30 days supply | Qty: 30 | Fill #0

## 2019-09-27 MED FILL — metFORMIN HCL 500 MG TABS: 500 | 30 days supply | Qty: 30 | Fill #0

## 2019-09-27 MED FILL — METOPROLOL TARTRATE 25 MG T: 25 | 30 days supply | Qty: 30 | Fill #0

## 2019-09-27 MED FILL — ATORVASTATIN CALCIUM 40 MG: 40 | 30 days supply | Qty: 30 | Fill #0

## 2019-09-27 MED FILL — LISINOPRIL 2.5 MG TABLET: 2.5 | 30 days supply | Qty: 30 | Fill #0

## 2019-09-27 MED FILL — ISOSORBIDE MN ER 30 MG TAB: 30 | 30 days supply | Qty: 15 | Fill #0

## 2019-09-27 MED FILL — OMEPRAZOLE 20 MG CAP: 20 | 30 days supply | Qty: 30 | Fill #0

## 2019-09-27 MED FILL — NAPROXEN 500 MG TABLET: 500 | 15 days supply | Qty: 30 | Fill #0

## 2019-09-27 NOTE — Progress Notes (Signed)
Patient [facility]   Established Patient Office Visit  Subjective:  Patient ID: Virginia [name], female    DOB: [birth year]  Age: 58 y.o. MRN: [id number]  CC:  Chief Complaint  Patient presents with   Follow-up    6 month follow up    HPI Virginia [name] is a 58 year old female who presents for Follow Up today.   Past Medical History:  Diagnosis Date   Anemia    CAD (coronary artery disease)    a. 06/2015 BMS x2 to LCx. b. recurrent CP, cath 09/18/2015 patent stent, tiny diag stenosis medical therapy.    CHF (congestive heart failure) (HCC)    Diabetes mellitus without complication (HCC)    GERD (gastroesophageal reflux disease)    ?   Hyperlipidemia with target LDL less than 70    Hypertension    Shortness of breath dyspnea    with exertion   STEMI (ST elevation myocardial infarction) ([place]) 06/16/2015   BMS x 2 CFX   Thyroid nodule    benign   Current Status: Since her last office visit, she is doing well with no complaints. She has had a recent weight loss. She denies fatigue, frequent urination, blurred vision, excessive hunger, excessive thirst, weight gain, weight loss, and poor wound healing. She continues to check her feet regularly. Blood pressures are elevated today, which she states that she hs not taken her hypertensive medications as of yet today. She denies visual changes, chest pain, cough, shortness of breath, heart palpitations, and falls. She has occasional headaches and dizziness with position changes. Denies severe headaches, confusion, seizures, double vision, and blurred vision, nausea and vomiting. She denies fevers, chills, fatigue, recent infections, weight loss, and night sweats. She has not had any headaches, visual changes, dizziness, and falls. No chest pain, heart palpitations, cough and shortness of breath reported. No reports of GI problems such as nausea, vomiting, diarrhea, and constipation. She  has no reports of blood in stools, dysuria and hematuria. No depression or anxiety reported today. She denies pain today.   Past Surgical History:  Procedure Laterality Date   ABDOMINAL HYSTERECTOMY N/A 02/02/2016   Procedure: HYSTERECTOMY ABDOMINAL WITH BILATERAL SALPINGECTOMY;  Surgeon: Woodroe Mode, MD;  Location: [facility];  Service: Gynecology;  Laterality: N/A;   APPENDECTOMY     CARDIAC CATHETERIZATION N/A 06/17/2015   Procedure: Left Heart Cath and Coronary Angiography;  Surgeon: Jettie Booze, MD;  Location: [facility];  Service: Cardiovascular;  Laterality: N/A;   CARDIAC CATHETERIZATION  06/17/2015   Procedure: Coronary Stent Intervention;  Surgeon: Jettie Booze, MD;  Location: [facility];  Service: Cardiovascular;;   CARDIAC CATHETERIZATION N/A 09/18/2015   Procedure: Left Heart Cath and Coronary Angiography;  Surgeon: Troy Sine, MD;  Location: [facility];  Service: Cardiovascular;  Laterality: N/A;   CESAREAN SECTION     Myomectomy done during cesarean section   COLONOSCOPY N/A 09/20/2015   Procedure: COLONOSCOPY;  Surgeon: Carol Ada, MD;  Location: [facility];  Service: Endoscopy;  Laterality: N/A;   COLONOSCOPY     ESOPHAGOGASTRODUODENOSCOPY N/A 09/20/2015   Procedure: ESOPHAGOGASTRODUODENOSCOPY (EGD);  Surgeon: Carol Ada, MD;  Location: [facility];  Service: Endoscopy;  Laterality: N/A;   olympus  quick clip pro gastric body  10/09/15    Family History  Problem Relation Age of Onset   Stroke Mother    Heart disease Mother  Diabetes Mother    Cancer Father    Hypertension Sister    Hyperlipidemia Sister    Heart attack Brother    Hypertension Brother    Heart attack Maternal Uncle    Asthma Paternal Uncle    Diabetes Paternal Uncle    Diabetes Paternal Aunt    Thyroid disease Neg Hx     Social History   Socioeconomic History   Marital status: Married    Spouse name: Not on file    Number of children: Not on file   Years of education: Not on file   Highest education level: Not on file  Occupational History   Not on file  Social Needs   Financial resource strain: Not on file   Food insecurity    Worry: Not on file    Inability: Not on file   Transportation needs    Medical: Not on file    Non-medical: Not on file  Tobacco Use   Smoking status: Never Smoker   Smokeless tobacco: Never Used  Substance and Sexual Activity   Alcohol use: No    Alcohol/week: 0.0 standard drinks   Drug use: No   Sexual activity: Not Currently    Birth control/protection: Post-menopausal  Lifestyle   Physical activity    Days per week: Not on file    Minutes per session: Not on file   Stress: Not on file  Relationships   Social connections    Talks on phone: Not on file    Gets together: Not on file    Attends religious service: Not on file    Active member of club or organization: Not on file    Attends meetings of clubs or organizations: Not on file    Relationship status: Not on file   Intimate partner violence    Fear of current or ex partner: Not on file    Emotionally abused: Not on file    Physically abused: Not on file    Forced sexual activity: Not on file  Other Topics Concern   Not on file  Social History Narrative   Not on file    Outpatient Medications Prior to Visit  Medication Sig Dispense Refill   acetaminophen (TYLENOL) 325 MG tablet Take 2 tablets (650 mg total) by mouth every 4 (four) hours as needed for mild pain.     aspirin EC 81 MG tablet Take 1 tablet (81 mg total) by mouth daily. 30 tablet 6   atorvastatin (LIPITOR) 40 MG tablet Take 1 tablet (40 mg total) by mouth daily. 90 tablet 3   ferrous sulfate (FEROSUL) 325 (65 FE) MG tablet Take 1 tablet (325 mg total) by mouth daily with breakfast. 30 tablet 6   gabapentin (NEURONTIN) 300 MG capsule Take 1 capsule (300 mg total) by mouth 2 (two) times daily. 60 capsule 6    isosorbide mononitrate (IMDUR) 30 MG 24 hr tablet Take 0.5 tablets (15 mg total) by mouth daily. 90 tablet 1   lisinopril (ZESTRIL) 2.5 MG tablet Take 1 tablet (2.5 mg total) by mouth daily. 90 tablet 1   metFORMIN (GLUCOPHAGE) 500 MG tablet Take 1 tablet (500 mg total) by mouth daily with breakfast. 90 tablet 1   metoprolol tartrate (LOPRESSOR) 25 MG tablet Take 0.5 tablets (12.5 mg total) by mouth 2 (two) times daily. 180 tablet 3   naproxen (NAPROSYN) 500 MG tablet Take 1 tablet (500 mg total) by mouth 2 (two) times daily. 30 tablet 6   omeprazole (  PRILOSEC) 20 MG capsule Take 1 capsule (20 mg total) by mouth daily. 90 capsule 1   vitamin B-12 (CYANOCOBALAMIN) 1000 MCG tablet Take 1 tablet (1,000 mcg total) by mouth daily. 90 tablet 1   cyclobenzaprine (FLEXERIL) 10 MG tablet Take 1 tablet (10 mg total) by mouth 2 (two) times daily as needed for muscle spasms. (Patient not taking: Reported on 09/27/2019) 20 tablet 0   nitroGLYCERIN (NITROSTAT) 0.4 MG SL tablet Place 1 tablet (0.4 mg total) under the tongue every 5 (five) minutes as needed for chest pain. (Patient not taking: Reported on 09/27/2019) 25 tablet 2   estradiol (ESTRACE) 1 MG tablet Take 1 tablet (1 mg total) by mouth daily. 30 tablet 6   penicillin v potassium (VEETID) 500 MG tablet Take 1 tablet (500 mg total) by mouth 3 (three) times daily. 30 tablet 0   traMADol (ULTRAM) 50 MG tablet Take 1 tablet (50 mg total) by mouth every 6 (six) hours as needed for moderate pain. 10 tablet 0   No facility-administered medications prior to visit.     No Known Allergies  ROS Review of Systems  Constitutional: Negative.   HENT: Negative.   Eyes: Negative.   Respiratory: Negative.   Cardiovascular: Negative.   Gastrointestinal: Negative.   Endocrine: Negative.   Genitourinary: Negative.   Musculoskeletal: Negative.   Skin: Negative.   Allergic/Immunologic: Negative.   Neurological: Positive for dizziness (occasional ) and  headaches (occasional ).  Hematological: Negative.   Psychiatric/Behavioral: Negative.       Objective:    Physical Exam  Constitutional: She is oriented to person, place, and time. She appears well-developed and well-nourished.  HENT:  Head: Normocephalic and atraumatic.  Eyes: Conjunctivae are normal.  Neck: Normal range of motion. Neck supple.  Cardiovascular: Normal rate, regular rhythm, normal heart sounds and intact distal pulses.  Pulmonary/Chest: Effort normal and breath sounds normal.  Abdominal: Soft. Bowel sounds are normal.  Musculoskeletal: Normal range of motion.  Neurological: She is alert and oriented to person, place, and time. She has normal reflexes.  Skin: Skin is warm and dry.  Psychiatric: She has a normal mood and affect. Her behavior is normal. Judgment and thought content normal.  Nursing note and vitals reviewed.   BP 136/84    Pulse 72    Temp 98.2 F (36.8 C) (Oral)    Ht 5\' 6"  (1.676 m)    Wt 166 lb 8 oz (75.5 kg)    LMP 12/29/2015    SpO2 98%    BMI 26.87 kg/m  Wt Readings from Last 3 Encounters:  09/27/19 166 lb 8 oz (75.5 kg)  04/27/19 178 lb 5.6 oz (80.9 kg)  03/26/19 178 lb 6.4 oz (80.9 kg)     Health Maintenance Due  Topic Date Due   MAMMOGRAM  05/19/2018   PAP SMEAR-Modifier  10/15/2018   OPHTHALMOLOGY EXAM  03/22/2019   FOOT EXAM  09/26/2019   HEMOGLOBIN A1C  09/26/2019    There are no preventive care reminders to display for this patient.  Lab Results  Component Value Date   TSH 0.70 12/14/2016   Lab Results  Component Value Date   WBC 4.2 04/27/2019   HGB 12.2 04/27/2019   HCT 38.4 04/27/2019   MCV 92.8 04/27/2019   PLT 169 04/27/2019   Lab Results  Component Value Date   NA 139 04/27/2019   K 3.5 04/27/2019   CO2 24 04/27/2019   GLUCOSE 97 04/27/2019   BUN 9 04/27/2019  CREATININE 1.02 (H) 04/27/2019   BILITOT 0.4 12/14/2016   ALKPHOS 86 12/14/2016   AST 25 12/14/2016   ALT 22 12/14/2016   PROT 7.0  12/14/2016   ALBUMIN 4.1 12/14/2016   CALCIUM 9.2 04/27/2019   ANIONGAP 9 04/27/2019   GFR 73.08 07/29/2015   Lab Results  Component Value Date   CHOL 141 12/14/2017   Lab Results  Component Value Date   HDL 60 12/14/2017   Lab Results  Component Value Date   LDLCALC 72 12/14/2017   Lab Results  Component Value Date   TRIG 47 12/14/2017   Lab Results  Component Value Date   CHOLHDL 2.4 12/14/2017   Lab Results  Component Value Date   HGBA1C 5.7 (A) 09/27/2019      Assessment & Plan:   1. Controlled type 2 diabetes mellitus without complication, without long-term current use of insulin (HCC) Glucose level is within normal range at 83 today. She will continue medication as prescribed, to decrease foods/beverages high in sugars and carbs and follow Heart Healthy or DASH diet. Increase physical activity to at least 30 minutes cardio exercise daily.  - Urinalysis Dipstick - POCT HgB A1C - Glucose (CBG) - ferrous sulfate (FEROSUL) 325 (65 FE) MG tablet; Take 1 tablet (325 mg total) by mouth daily with breakfast.  Dispense: 30 tablet; Refill: 6 - lisinopril (ZESTRIL) 2.5 MG tablet; Take 1 tablet (2.5 mg total) by mouth daily.  Dispense: 90 tablet; Refill: 1 - metFORMIN (GLUCOPHAGE) 500 MG tablet; Take 1 tablet (500 mg total) by mouth daily with breakfast.  Dispense: 90 tablet; Refill: 1 - vitamin B-12 (CYANOCOBALAMIN) 1000 MCG tablet; Take 1 tablet (1,000 mcg total) by mouth daily.  Dispense: 90 tablet; Refill: 1  2. Prediabetes  3. Hemoglobin A1c less than 7.0% Hgb A1c is at 5.7 today.   4. Neuropathy - acetaminophen (TYLENOL) 325 MG tablet; Take 2 tablets (650 mg total) by mouth every 4 (four) hours as needed for mild pain.  Dispense:   - aspirin EC 81 MG tablet; Take 1 tablet (81 mg total) by mouth daily.  Dispense: 30 tablet; Refill: 6 - gabapentin (NEURONTIN) 300 MG capsule; Take 1 capsule (300 mg total) by mouth 2 (two) times daily.  Dispense: 60 capsule; Refill:  6 - naproxen (NAPROSYN) 500 MG tablet; Take 1 tablet (500 mg total) by mouth 2 (two) times daily.  Dispense: 30 tablet; Refill: 6  5. Hyperlipidemia with target LDL less than 70 - atorvastatin (LIPITOR) 40 MG tablet; Take 1 tablet (40 mg total) by mouth daily.  Dispense: 90 tablet; Refill: 3  6. Essential hypertension The current medical regimen is effective; blood pressure is stable at 136/84 today; continue present plan and medications as prescribed. She will continue to take medications as prescribed, to decrease high sodium intake, excessive alcohol intake, increase potassium intake, smoking cessation, and increase physical activity of at least 30 minutes of cardio activity daily. She will continue to follow Heart Healthy or DASH diet. - isosorbide mononitrate (IMDUR) 30 MG 24 hr tablet; Take 0.5 tablets (15 mg total) by mouth daily.  Dispense: 90 tablet; Refill: 1 - lisinopril (ZESTRIL) 2.5 MG tablet; Take 1 tablet (2.5 mg total) by mouth daily.  Dispense: 90 tablet; Refill: 1 - metoprolol tartrate (LOPRESSOR) 25 MG tablet; Take 0.5 tablets (12.5 mg total) by mouth 2 (two) times daily.  Dispense: 180 tablet; Refill: 3  7. Gastroesophageal reflux disease without esophagitis - omeprazole (PRILOSEC) 20 MG capsule; Take 1 capsule (20  mg total) by mouth daily.  Dispense: 90 capsule; Refill: 1  8. Numbness of anterior thigh Stable. - vitamin B-12 (CYANOCOBALAMIN) 1000 MCG tablet; Take 1 tablet (1,000 mcg total) by mouth daily.  Dispense: 90 tablet; Refill: 1  9. Recent weight loss She has had a 23 lb weight loss in 1 year.   10. Flu vaccine need - Flu Vaccine QUAD 6+ mos PF IM (Fluarix Quad PF)  11. Follow up She will follow up in 7 months.   Meds ordered this encounter  Medications   acetaminophen (TYLENOL) 325 MG tablet    Sig: Take 2 tablets (650 mg total) by mouth every 4 (four) hours as needed for mild pain.    Dispense:      aspirin EC 81 MG tablet    Sig: Take 1 tablet (81  mg total) by mouth daily.    Dispense:  30 tablet    Refill:  6   atorvastatin (LIPITOR) 40 MG tablet    Sig: Take 1 tablet (40 mg total) by mouth daily.    Dispense:  90 tablet    Refill:  3   ferrous sulfate (FEROSUL) 325 (65 FE) MG tablet    Sig: Take 1 tablet (325 mg total) by mouth daily with breakfast.    Dispense:  30 tablet    Refill:  6   gabapentin (NEURONTIN) 300 MG capsule    Sig: Take 1 capsule (300 mg total) by mouth 2 (two) times daily.    Dispense:  60 capsule    Refill:  6   isosorbide mononitrate (IMDUR) 30 MG 24 hr tablet    Sig: Take 0.5 tablets (15 mg total) by mouth daily.    Dispense:  90 tablet    Refill:  1   lisinopril (ZESTRIL) 2.5 MG tablet    Sig: Take 1 tablet (2.5 mg total) by mouth daily.    Dispense:  90 tablet    Refill:  1   metFORMIN (GLUCOPHAGE) 500 MG tablet    Sig: Take 1 tablet (500 mg total) by mouth daily with breakfast.    Dispense:  90 tablet    Refill:  1   metoprolol tartrate (LOPRESSOR) 25 MG tablet    Sig: Take 0.5 tablets (12.5 mg total) by mouth 2 (two) times daily.    Dispense:  180 tablet    Refill:  3   naproxen (NAPROSYN) 500 MG tablet    Sig: Take 1 tablet (500 mg total) by mouth 2 (two) times daily.    Dispense:  30 tablet    Refill:  6   omeprazole (PRILOSEC) 20 MG capsule    Sig: Take 1 capsule (20 mg total) by mouth daily.    Dispense:  90 capsule    Refill:  1   vitamin B-12 (CYANOCOBALAMIN) 1000 MCG tablet    Sig: Take 1 tablet (1,000 mcg total) by mouth daily.    Dispense:  90 tablet    Refill:  1    Orders Placed This Encounter  Procedures   Flu Vaccine QUAD 6+ mos PF IM (Fluarix Quad PF)   Urinalysis Dipstick   POCT HgB A1C   Glucose (CBG)    Referral Orders  No referral(s) requested today   Kathe Becton,  MSN, FNP-BC [facility] [address] [place], [place] [address] [phone]  fax  Problem List Items Addressed This Visit      Cardiovascular and  Mediastinum   Essential hypertension   Relevant Medications   aspirin EC 81 MG tablet   atorvastatin (LIPITOR) 40 MG tablet   isosorbide mononitrate (IMDUR) 30 MG 24 hr tablet   lisinopril (ZESTRIL) 2.5 MG tablet   metoprolol tartrate (LOPRESSOR) 25 MG tablet     Digestive   Gastroesophageal reflux disease without esophagitis   Relevant Medications   omeprazole (PRILOSEC) 20 MG capsule     Endocrine   Controlled type 2 diabetes mellitus without complication, without long-term current use of insulin (HCC) - Primary   Relevant Medications   aspirin EC 81 MG tablet   atorvastatin (LIPITOR) 40 MG tablet   ferrous sulfate (FEROSUL) 325 (65 FE) MG tablet   lisinopril (ZESTRIL) 2.5 MG tablet   metFORMIN (GLUCOPHAGE) 500 MG tablet   vitamin B-12 (CYANOCOBALAMIN) 1000 MCG tablet   Other Relevant Orders   Urinalysis Dipstick (Completed)   POCT HgB A1C (Completed)   Glucose (CBG) (Completed)     Nervous and Auditory   Neuropathy   Relevant Medications   acetaminophen (TYLENOL) 325 MG tablet   aspirin EC 81 MG tablet   gabapentin (NEURONTIN) 300 MG capsule   naproxen (NAPROSYN) 500 MG tablet     Other   Hyperlipidemia with target LDL less than 70 (Chronic)   Relevant Medications   aspirin EC 81 MG tablet   atorvastatin (LIPITOR) 40 MG tablet   isosorbide mononitrate (IMDUR) 30 MG 24 hr tablet   lisinopril (ZESTRIL) 2.5 MG tablet   metoprolol tartrate (LOPRESSOR) 25 MG tablet   Numbness of anterior thigh   Relevant Medications   vitamin B-12 (CYANOCOBALAMIN) 1000 MCG tablet    Other Visit Diagnoses    Prediabetes       Hemoglobin A1c less than 7.0%       Recent weight loss       Flu vaccine need       Relevant Orders   Flu Vaccine QUAD 6+ mos PF IM (Fluarix Quad PF) (Completed)   Follow up          Meds ordered this encounter  Medications   acetaminophen (TYLENOL) 325 MG tablet    Sig: Take 2  tablets (650 mg total) by mouth every 4 (four) hours as needed for mild pain.    Dispense:      aspirin EC 81 MG tablet    Sig: Take 1 tablet (81 mg total) by mouth daily.    Dispense:  30 tablet    Refill:  6   atorvastatin (LIPITOR) 40 MG tablet    Sig: Take 1 tablet (40 mg total) by mouth daily.    Dispense:  90 tablet    Refill:  3   ferrous sulfate (FEROSUL) 325 (65 FE) MG tablet    Sig: Take 1 tablet (325 mg total) by mouth daily with breakfast.    Dispense:  30 tablet    Refill:  6   gabapentin (NEURONTIN) 300 MG capsule    Sig: Take 1 capsule (300 mg total) by mouth 2 (two) times daily.    Dispense:  60 capsule    Refill:  6   isosorbide mononitrate (IMDUR) 30 MG 24 hr tablet    Sig: Take 0.5 tablets (15 mg total) by mouth daily.    Dispense:  90 tablet    Refill:  1   lisinopril (ZESTRIL) 2.5 MG tablet    Sig: Take 1 tablet (2.5 mg total) by mouth daily.    Dispense:  90  tablet    Refill:  1   metFORMIN (GLUCOPHAGE) 500 MG tablet    Sig: Take 1 tablet (500 mg total) by mouth daily with breakfast.    Dispense:  90 tablet    Refill:  1   metoprolol tartrate (LOPRESSOR) 25 MG tablet    Sig: Take 0.5 tablets (12.5 mg total) by mouth 2 (two) times daily.    Dispense:  180 tablet    Refill:  3   naproxen (NAPROSYN) 500 MG tablet    Sig: Take 1 tablet (500 mg total) by mouth 2 (two) times daily.    Dispense:  30 tablet    Refill:  6   omeprazole (PRILOSEC) 20 MG capsule    Sig: Take 1 capsule (20 mg total) by mouth daily.    Dispense:  90 capsule    Refill:  1   vitamin B-12 (CYANOCOBALAMIN) 1000 MCG tablet    Sig: Take 1 tablet (1,000 mcg total) by mouth daily.    Dispense:  90 tablet    Refill:  1    Follow-up: Return in about 7 months (around 04/26/2020).    Azzie Glatter, FNP

## 2019-09-29 DIAGNOSIS — R7309 Other abnormal glucose: Secondary | ICD-10-CM | POA: Insufficient documentation

## 2019-09-29 DIAGNOSIS — R634 Abnormal weight loss: Secondary | ICD-10-CM | POA: Insufficient documentation

## 2019-10-01 ENCOUNTER — Emergency Department (HOSPITAL_COMMUNITY): Payer: Self-pay

## 2019-10-01 ENCOUNTER — Other Ambulatory Visit: Payer: Self-pay

## 2019-10-01 ENCOUNTER — Emergency Department (HOSPITAL_COMMUNITY)
Admission: EM | Admit: 2019-10-01 | Discharge: 2019-10-01 | Disposition: A | Discharge status: Home / Self Care | Payer: Self-pay | Attending: Emergency Medicine | Admitting: Emergency Medicine

## 2019-10-01 DIAGNOSIS — W010XXA Fall on same level from slipping, tripping and stumbling without subsequent striking against object, initial encounter: Secondary | ICD-10-CM | POA: Insufficient documentation

## 2019-10-01 DIAGNOSIS — I509 Heart failure, unspecified: Secondary | ICD-10-CM | POA: Insufficient documentation

## 2019-10-01 DIAGNOSIS — Y939 Activity, unspecified: Secondary | ICD-10-CM | POA: Insufficient documentation

## 2019-10-01 DIAGNOSIS — Z7982 Long term (current) use of aspirin: Secondary | ICD-10-CM | POA: Insufficient documentation

## 2019-10-01 DIAGNOSIS — Z79899 Other long term (current) drug therapy: Secondary | ICD-10-CM | POA: Insufficient documentation

## 2019-10-01 DIAGNOSIS — Y9259 Other trade areas as the place of occurrence of the external cause: Secondary | ICD-10-CM | POA: Insufficient documentation

## 2019-10-01 DIAGNOSIS — T148XXA Other injury of unspecified body region, initial encounter: Secondary | ICD-10-CM | POA: Insufficient documentation

## 2019-10-01 DIAGNOSIS — I2510 Atherosclerotic heart disease of native coronary artery without angina pectoris: Secondary | ICD-10-CM | POA: Insufficient documentation

## 2019-10-01 DIAGNOSIS — Z7984 Long term (current) use of oral hypoglycemic drugs: Secondary | ICD-10-CM | POA: Insufficient documentation

## 2019-10-01 DIAGNOSIS — M25572 Pain in left ankle and joints of left foot: Secondary | ICD-10-CM | POA: Insufficient documentation

## 2019-10-01 DIAGNOSIS — E041 Nontoxic single thyroid nodule: Secondary | ICD-10-CM

## 2019-10-01 DIAGNOSIS — M25512 Pain in left shoulder: Secondary | ICD-10-CM | POA: Insufficient documentation

## 2019-10-01 DIAGNOSIS — Y999 Unspecified external cause status: Secondary | ICD-10-CM | POA: Insufficient documentation

## 2019-10-01 DIAGNOSIS — I110 Hypertensive heart disease with heart failure: Secondary | ICD-10-CM | POA: Insufficient documentation

## 2019-10-01 DIAGNOSIS — W19XXXA Unspecified fall, initial encounter: Secondary | ICD-10-CM

## 2019-10-01 DIAGNOSIS — R519 Headache, unspecified: Secondary | ICD-10-CM | POA: Insufficient documentation

## 2019-10-01 DIAGNOSIS — T07XXXA Unspecified multiple injuries, initial encounter: Secondary | ICD-10-CM

## 2019-10-01 MED ORDER — HYDROCODONE-ACETAMINOPHEN 5-325 MG PO TABS
1.0000 | ORAL_TABLET | Freq: Once | ORAL | Status: AC
  Administered 2019-10-01: 22:00:00 1 via ORAL
  Filled 2019-10-01: qty 1

## 2019-10-01 MED ORDER — ACETAMINOPHEN 325 MG PO TABS
650.0000 mg | ORAL_TABLET | Freq: Once | ORAL | Status: AC
  Administered 2019-10-01: 22:00:00 650 mg via ORAL
  Filled 2019-10-01: qty 2

## 2019-10-01 MED ORDER — IBUPROFEN 600 MG PO TABS: 600.0000 mg | ORAL_TABLET | Freq: Three times a day (TID) | ORAL | 0 refills | Status: DC

## 2019-10-01 MED ORDER — METHOCARBAMOL 500 MG PO TABS: 500.0000 mg | ORAL_TABLET | Freq: Two times a day (BID) | ORAL | 0 refills | Status: DC

## 2019-10-01 NOTE — ED Provider Notes (Signed)
[facility] DEPT Provider Note   CSN: [id number] Arrival date & time: 10/01/19  1920     History   Chief Complaint Chief Complaint  Patient presents with   Fall    HPI Virginia [name] is a 58 y.o. female.     HPI  58 year old female with history of CAD, CHF, diabetes comes in a chief complaint of fall. Patient reports that she was at a store and tripped over a toy.  She fell backwards and complains of left ankle, left shoulder and back pain.  She is also having headache but denies any loss of consciousness.  Patient is also having neck pain but denies any numbness or tingling.  She is not on any blood thinners.  Past Medical History:  Diagnosis Date   Anemia    CAD (coronary artery disease)    a. 06/2015 BMS x2 to LCx. b. recurrent CP, cath 09/18/2015 patent stent, tiny diag stenosis medical therapy.    CHF (congestive heart failure) (HCC)    Diabetes mellitus without complication (HCC)    GERD (gastroesophageal reflux disease)    ?   Hyperlipidemia with target LDL less than 70    Hypertension    Shortness of breath dyspnea    with exertion   STEMI (ST elevation myocardial infarction) ([place]) 06/16/2015   BMS x 2 CFX   Thyroid nodule    benign    Patient Active Problem List   Diagnosis Date Noted   Hemoglobin A1c less than 7.0% 09/29/2019   Recent weight loss 09/29/2019   Acute otitis media 03/26/2019   Neuropathy 03/26/2019   CAD (coronary artery disease) 12/26/2017   Old MI (myocardial infarction) 12/26/2017   Gastroesophageal reflux disease without esophagitis 03/16/2017   Blood pressure check 12/28/2016   Bradycardia 12/14/2016   Controlled type 2 diabetes mellitus without complication, without long-term current use of insulin ([place]) 03/29/2016   Numbness of anterior thigh 03/29/2016   Menopausal hot flushes 03/10/2016   Essential hypertension 12/29/2015   Fibroid uterus 11/13/2015   Transient right  leg weakness 10/05/2015   Acute GI bleeding    Symptomatic anemia 09/19/2015   Vasovagal near syncope 09/19/2015   Dysfunctional uterine bleeding 09/19/2015   Diarrhea due to drug 09/19/2015   Heme positive stool 09/19/2015   Chest pain 09/17/2015   Multinodular goiter 07/09/2015   CAD S/P CFX BMS (? med complinace) x 2 06/16/15 07/01/2015   Acute renal insufficiency 07/01/2015   Abdominal pain 07/01/2015   Nodular goiter 06/23/2015   Diabetes mellitus type 2, controlled ([place]) 06/23/2015   STEMI -06/16/15 06/17/2015   Hyperlipidemia with target LDL less than 70     Past Surgical History:  Procedure Laterality Date   ABDOMINAL HYSTERECTOMY N/A 02/02/2016   Procedure: HYSTERECTOMY ABDOMINAL WITH BILATERAL SALPINGECTOMY;  Surgeon: Woodroe Mode, MD;  Location: [facility];  Service: Gynecology;  Laterality: N/A;   APPENDECTOMY     CARDIAC CATHETERIZATION N/A 06/17/2015   Procedure: Left Heart Cath and Coronary Angiography;  Surgeon: Jettie Booze, MD;  Location: [facility];  Service: Cardiovascular;  Laterality: N/A;   CARDIAC CATHETERIZATION  06/17/2015   Procedure: Coronary Stent Intervention;  Surgeon: Jettie Booze, MD;  Location: [facility];  Service: Cardiovascular;;   CARDIAC CATHETERIZATION N/A 09/18/2015   Procedure: Left Heart Cath and Coronary Angiography;  Surgeon: Troy Sine, MD;  Location: [facility];  Service: Cardiovascular;  Laterality: N/A;   CESAREAN SECTION  Myomectomy done during cesarean section   COLONOSCOPY N/A 09/20/2015   Procedure: COLONOSCOPY;  Surgeon: Carol Ada, MD;  Location: [facility];  Service: Endoscopy;  Laterality: N/A;   COLONOSCOPY     ESOPHAGOGASTRODUODENOSCOPY N/A 09/20/2015   Procedure: ESOPHAGOGASTRODUODENOSCOPY (EGD);  Surgeon: Carol Ada, MD;  Location: [facility] ENDOSCOPY;  Service: Endoscopy;  Laterality: N/A;   olympus  quick clip pro gastric body  10/09/15     OB History     Gravida  2   Para  2   Term  2   Preterm      AB      Living  2     SAB      TAB      Ectopic      Multiple      Live Births               Home Medications    Prior to Admission medications   Medication Sig Start Date End Date Taking? Authorizing Provider  acetaminophen (TYLENOL) 325 MG tablet Take 2 tablets (650 mg total) by mouth every 4 (four) hours as needed for mild pain. 09/27/19  Yes Azzie Glatter, FNP  aspirin EC 81 MG tablet Take 1 tablet (81 mg total) by mouth daily. 09/27/19  Yes Azzie Glatter, FNP  atorvastatin (LIPITOR) 40 MG tablet Take 1 tablet (40 mg total) by mouth daily. 09/27/19 09/21/20 Yes Azzie Glatter, FNP  ferrous sulfate (FEROSUL) 325 (65 FE) MG tablet Take 1 tablet (325 mg total) by mouth daily with breakfast. 09/27/19  Yes Azzie Glatter, FNP  gabapentin (NEURONTIN) 300 MG capsule Take 1 capsule (300 mg total) by mouth 2 (two) times daily. Patient taking differently: Take 300 mg by mouth 2 (two) times daily as needed.  09/27/19  Yes Azzie Glatter, FNP  isosorbide mononitrate (IMDUR) 30 MG 24 hr tablet Take 0.5 tablets (15 mg total) by mouth daily. 09/27/19  Yes Azzie Glatter, FNP  lisinopril (ZESTRIL) 2.5 MG tablet Take 1 tablet (2.5 mg total) by mouth daily. 09/27/19  Yes Azzie Glatter, FNP  metFORMIN (GLUCOPHAGE) 500 MG tablet Take 1 tablet (500 mg total) by mouth daily with breakfast. 09/27/19  Yes Azzie Glatter, FNP  metoprolol tartrate (LOPRESSOR) 25 MG tablet Take 0.5 tablets (12.5 mg total) by mouth 2 (two) times daily. 09/27/19  Yes Azzie Glatter, FNP  naproxen (NAPROSYN) 500 MG tablet Take 1 tablet (500 mg total) by mouth 2 (two) times daily. 09/27/19  Yes Azzie Glatter, FNP  omeprazole (PRILOSEC) 20 MG capsule Take 1 capsule (20 mg total) by mouth daily. 09/27/19  Yes Azzie Glatter, FNP  vitamin B-12 (CYANOCOBALAMIN) 1000 MCG tablet Take 1 tablet (1,000 mcg total) by mouth daily. 09/27/19  Yes  Azzie Glatter, FNP  cyclobenzaprine (FLEXERIL) 10 MG tablet Take 1 tablet (10 mg total) by mouth 2 (two) times daily as needed for muscle spasms. Patient not taking: Reported on 09/27/2019 04/27/19   Domenic Moras, PA-C  ibuprofen (ADVIL) 600 MG tablet Take 1 tablet (600 mg total) by mouth 3 (three) times daily. 10/01/19   Varney Biles, MD  methocarbamol (ROBAXIN) 500 MG tablet Take 1 tablet (500 mg total) by mouth 2 (two) times daily. 10/01/19   Varney Biles, MD  nitroGLYCERIN (NITROSTAT) 0.4 MG SL tablet Place 1 tablet (0.4 mg total) under the tongue every 5 (five) minutes as needed for chest pain. Patient not taking: Reported on 09/27/2019 03/26/19  Azzie Glatter, FNP    Family History Family History  Problem Relation Age of Onset   Stroke Mother    Heart disease Mother    Diabetes Mother    Cancer Father    Hypertension Sister    Hyperlipidemia Sister    Heart attack Brother    Hypertension Brother    Heart attack Maternal Uncle    Asthma Paternal Uncle    Diabetes Paternal Uncle    Diabetes Paternal Aunt    Thyroid disease Neg Hx     Social History Social History   Tobacco Use   Smoking status: Never Smoker   Smokeless tobacco: Never Used  Substance Use Topics   Alcohol use: No    Alcohol/week: 0.0 standard drinks   Drug use: No     Allergies   Patient has no known allergies.   Review of Systems Review of Systems  Constitutional: Positive for activity change.  Musculoskeletal: Positive for arthralgias.  Skin: Negative for wound.  Neurological: Positive for headaches.  Hematological: Does not bruise/bleed easily.  All other systems reviewed and are negative.    Physical Exam Updated Vital Signs BP (!) 180/88    Pulse 67    Temp 98.1 F (36.7 C) (Oral)    Resp 15    Ht 5\' 6"  (1.676 m)    Wt 73.9 kg    LMP 12/29/2015    SpO2 100%    BMI 26.31 kg/m   Physical Exam Vitals signs and nursing note reviewed.  Constitutional:       Appearance: She is well-developed.  HENT:     Head: Normocephalic and atraumatic.  Neck:     Musculoskeletal: Muscular tenderness present.  Cardiovascular:     Rate and Rhythm: Normal rate.  Pulmonary:     Effort: Pulmonary effort is normal.  Abdominal:     General: Bowel sounds are normal.  Musculoskeletal:        General: Tenderness present. No deformity.  Skin:    General: Skin is warm and dry.  Neurological:     General: No focal deficit present.     Mental Status: She is alert and oriented to person, place, and time.      ED Treatments / Results  Labs (all labs ordered are listed, but only abnormal results are displayed) Labs Reviewed - No data to display  EKG None  Radiology Dg Thoracic Spine 2 View  Result Date: 10/01/2019 CLINICAL DATA:  Fall EXAM: THORACIC SPINE 2 VIEWS COMPARISON:  None. FINDINGS: Thoracic alignment is within normal limits. Vertebral body stature is normal. Mild disc space narrowing and osteophyte of the midthoracic spine. IMPRESSION: No acute osseous abnormality.  Mild degenerative changes Electronically Signed   By: Donavan Foil M.D.   On: 10/01/2019 22:20   Dg Ankle Complete Left  Result Date: 10/01/2019 CLINICAL DATA:  Fall EXAM: LEFT ANKLE COMPLETE - 3+ VIEW COMPARISON:  04/12/2015 FINDINGS: There is no evidence of fracture, dislocation, or joint effusion. There is no evidence of arthropathy or other focal bone abnormality. Soft tissues are unremarkable. IMPRESSION: Negative. Electronically Signed   By: Ulyses Jarred M.D.   On: 10/01/2019 20:25   Ct Head Wo Contrast  Result Date: 10/01/2019 CLINICAL DATA:  Fall, no reported head strike EXAM: CT HEAD WITHOUT CONTRAST CT CERVICAL SPINE WITHOUT CONTRAST TECHNIQUE: Multidetector CT imaging of the head and cervical spine was performed following the standard protocol without intravenous contrast. Multiplanar CT image reconstructions of the cervical spine were also  generated. COMPARISON:  CT  cervical spine April 12, 2015, soft tissue ultrasound June 26, 2015 FINDINGS: CT HEAD FINDINGS Brain: No evidence of acute infarction, hemorrhage, hydrocephalus, extra-axial collection or mass lesion/mass effect. Vascular: No hyperdense vessel or unexpected calcification. Skull: No calvarial fracture or suspicious osseous lesion. No scalp swelling or hematoma. Hyperostosis frontalis interna, a benign incidental finding. Sinuses/Orbits: Paranasal sinuses and mastoid air cells are predominantly clear. Included orbital structures are unremarkable. Other: None CT CERVICAL SPINE FINDINGS Alignment: Straightening of the normal cervical lordosis. Minimal degenerative retrolisthesis C3 on C4. No traumatic listhesis. Craniocervical and atlantoaxial alignment is maintained. Skull base and vertebrae: No acute fracture. No primary bone lesion or focal pathologic process. Soft tissues and spinal canal: No pre or paravertebral fluid or swelling. No visible canal hematoma. Disc levels: Multilevel intervertebral disc height loss with spondylitic endplate changes. Findings maximal at C3-4. Minimal facet degenerative change. No severe canal stenosis or foraminal narrowing. Upper chest: No acute abnormality in the upper chest or imaged lung apices. Other: Large hypoattenuating 3.7 by 2.0 by 5.0 cm partially calcified nodule in the left lobe thyroid. Corresponding well to previous cervical spine CT and soft tissue ultrasound. IMPRESSION: 1. No acute intracranial abnormality. 2. No acute cervical spine fracture or traumatic listhesis. 3. Minimal cervical spondylitic changes maximal C3-4. 4. 5 cm dominant nodule in the left lobe thyroid, ultrasound in 2016 previously recommended biopsy. Correlate with biopsy results of performed (no images available in the system at time of exam) otherwise consider re-evaluation with thyroid ultrasound to assess for change. This follows consensus guidelines: Managing Incidental Thyroid Nodules Detected  on Imaging: White Paper of the ACR Incidental Thyroid Findings Committee. J Am Coll Radiol 2015; 12:143-150. and [facility] 3-tiered system for managing ITNs: J Am Coll Radiol. 2015; Feb;12(2): 143-50. Electronically Signed   By: Lovena Le M.D.   On: 10/01/2019 22:09   Ct Cervical Spine Wo Contrast  Result Date: 10/01/2019 CLINICAL DATA:  Fall, no reported head strike EXAM: CT HEAD WITHOUT CONTRAST CT CERVICAL SPINE WITHOUT CONTRAST TECHNIQUE: Multidetector CT imaging of the head and cervical spine was performed following the standard protocol without intravenous contrast. Multiplanar CT image reconstructions of the cervical spine were also generated. COMPARISON:  CT cervical spine April 12, 2015, soft tissue ultrasound June 26, 2015 FINDINGS: CT HEAD FINDINGS Brain: No evidence of acute infarction, hemorrhage, hydrocephalus, extra-axial collection or mass lesion/mass effect. Vascular: No hyperdense vessel or unexpected calcification. Skull: No calvarial fracture or suspicious osseous lesion. No scalp swelling or hematoma. Hyperostosis frontalis interna, a benign incidental finding. Sinuses/Orbits: Paranasal sinuses and mastoid air cells are predominantly clear. Included orbital structures are unremarkable. Other: None CT CERVICAL SPINE FINDINGS Alignment: Straightening of the normal cervical lordosis. Minimal degenerative retrolisthesis C3 on C4. No traumatic listhesis. Craniocervical and atlantoaxial alignment is maintained. Skull base and vertebrae: No acute fracture. No primary bone lesion or focal pathologic process. Soft tissues and spinal canal: No pre or paravertebral fluid or swelling. No visible canal hematoma. Disc levels: Multilevel intervertebral disc height loss with spondylitic endplate changes. Findings maximal at C3-4. Minimal facet degenerative change. No severe canal stenosis or foraminal narrowing. Upper chest: No acute abnormality in the upper chest or imaged lung apices. Other: Large  hypoattenuating 3.7 by 2.0 by 5.0 cm partially calcified nodule in the left lobe thyroid. Corresponding well to previous cervical spine CT and soft tissue ultrasound. IMPRESSION: 1. No acute intracranial abnormality. 2. No acute cervical spine fracture or traumatic listhesis. 3. Minimal cervical spondylitic  changes maximal C3-4. 4. 5 cm dominant nodule in the left lobe thyroid, ultrasound in 2016 previously recommended biopsy. Correlate with biopsy results of performed (no images available in the system at time of exam) otherwise consider re-evaluation with thyroid ultrasound to assess for change. This follows consensus guidelines: Managing Incidental Thyroid Nodules Detected on Imaging: White Paper of the ACR Incidental Thyroid Findings Committee. J Am Coll Radiol 2015; 12:143-150. and [facility] 3-tiered system for managing ITNs: J Am Coll Radiol. 2015; Feb;12(2): 143-50. Electronically Signed   By: Lovena Le M.D.   On: 10/01/2019 22:09   Dg Chest Port 1 View  Result Date: 10/01/2019 CLINICAL DATA:  Fall EXAM: PORTABLE CHEST 1 VIEW COMPARISON:  04/02/2018 FINDINGS: The heart size and mediastinal contours are within normal limits. Both lungs are clear. The visualized skeletal structures are unremarkable. IMPRESSION: No active disease. Electronically Signed   By: Donavan Foil M.D.   On: 10/01/2019 22:20   Dg Shoulder Left  Result Date: 10/01/2019 CLINICAL DATA:  Recent trip and fall with shoulder pain, initial encounter EXAM: LEFT SHOULDER - 2+ VIEW COMPARISON:  None. FINDINGS: There is no evidence of fracture or dislocation. There is no evidence of arthropathy or other focal bone abnormality. Soft tissues are unremarkable. IMPRESSION: No acute abnormality noted. Electronically Signed   By: Inez Catalina M.D.   On: 10/01/2019 20:25    Procedures Procedures (including critical care time)  Medications Ordered in ED Medications  acetaminophen (TYLENOL) tablet 650 mg (650 mg Oral Given 10/01/19 2141)    HYDROcodone-acetaminophen (NORCO/VICODIN) 5-325 MG per tablet 1 tablet (1 tablet Oral Given 10/01/19 2141)     Initial Impression / Assessment and Plan / ED Course  I have reviewed the triage vital signs and the nursing notes.  Pertinent labs & imaging results that were available during my care of the patient were reviewed by me and considered in my medical decision making (see chart for details).        DDx includes: - Mechanical falls - ICH - Fractures - Contusions - Soft tissue injury  + fall. Multiple complaints. TTP over T spine and Cspine. + headaches. + ankle and shoulder pain. Appropriate imaging ordered and neg. The patient appears reasonably screened and/or stabilized for discharge and I doubt any other medical condition or other [facility] requiring further screening, evaluation, or treatment in the ED at this time prior to discharge.   Results from the ER workup discussed with the patient face to face and all questions answered to the best of my ability. The patient is safe for discharge with strict return precautions.   Final Clinical Impressions(s) / ED Diagnoses   Final diagnoses:  Multiple contusions  Fall, initial encounter  Thyroid nodule    ED Discharge Orders         Ordered    ibuprofen (ADVIL) 600 MG tablet  3 times daily     10/01/19 2242    methocarbamol (ROBAXIN) 500 MG tablet  2 times daily     10/01/19 2242           Varney Biles, MD 10/01/19 2305

## 2019-10-01 NOTE — ED Notes (Signed)
Pt to CT

## 2019-10-01 NOTE — ED Triage Notes (Signed)
Pt arriving via [facility] following a fall. Pt reports she was walking through the aisle of a store and tripped over a rolling toy. Pt reports rolling her left ankle. Complaining of left wrist, left shoulder, left ankle and back pain. Pt also has headache but denies hitting her head or losing consciousness. Pt A&Ox4 upon arrival.

## 2019-10-01 NOTE — Discharge Instructions (Signed)
All the imaging results are normal, and so are all the labs. There was an incidental finding of thyroid nodule on the CT scan.  Please follow-up with your PCP about it if you have not already.  You likely have contusion from the trauma, and the pain might get worse in 1-2 days. Please take ibuprofen round the clock for the 2 days and then as needed.

## 2019-11-13 MED FILL — ISOSORBIDE MN ER 30 MG TAB: 30 | 30 days supply | Qty: 15 | Fill #1

## 2019-11-13 MED FILL — METOPROLOL TARTRATE 25 MG T: 25 | 30 days supply | Qty: 30 | Fill #2

## 2020-01-05 ENCOUNTER — Ambulatory Visit: Payer: Self-pay | Attending: Internal Medicine

## 2020-01-05 ENCOUNTER — Other Ambulatory Visit: Payer: Self-pay

## 2020-01-05 ENCOUNTER — Ambulatory Visit: Payer: Self-pay

## 2020-01-05 DIAGNOSIS — Z23 Encounter for immunization: Secondary | ICD-10-CM | POA: Insufficient documentation

## 2020-01-05 NOTE — Progress Notes (Signed)
   Covid-19 Vaccination Clinic  Name:  Virginia [name]    MRN: [id number] DOB: 11-19-1960  01/05/2020  Virginia [name] was observed post Covid-19 immunization for 15 minutes without incidence. She was provided with Vaccine Information Sheet and instruction to access the V-Safe system.   Virginia [name] was instructed to call 911 with any severe reactions post vaccine: Marland Kitchen Difficulty breathing  . Swelling of your face and throat  . A fast heartbeat  . A bad rash all over your body  . Dizziness and weakness    Immunizations Administered    Name Date Dose VIS Date Route   Moderna COVID-19 Vaccine 01/05/2020  4:06 PM 0.5 mL 10/08/2019 Intramuscular   Manufacturer: Moderna   Lot: [id number]   [id number]

## 2020-02-08 ENCOUNTER — Ambulatory Visit: Payer: Self-pay | Attending: Internal Medicine

## 2020-02-08 DIAGNOSIS — Z23 Encounter for immunization: Secondary | ICD-10-CM

## 2020-02-08 NOTE — Progress Notes (Signed)
   Covid-19 Vaccination Clinic  Name:  Virginia [name]    MRN: [id number] DOB: 06-10-1961  02/08/2020  [name] was observed post Covid-19 immunization for 15 minutes without incident. She was provided with Vaccine Information Sheet and instruction to access the V-Safe system.   [name] was instructed to call 911 with any severe reactions post vaccine: Marland Kitchen Difficulty breathing  . Swelling of face and throat  . A fast heartbeat  . A bad rash all over body  . Dizziness and weakness   Immunizations Administered    Name Date Dose VIS Date Route   Moderna COVID-19 Vaccine 02/08/2020  3:24 PM 0.5 mL 10/08/2019 Intramuscular   Manufacturer: Moderna   Lot: [id number]   [id number]

## 2020-02-17 MED FILL — METOPROLOL TARTRATE 25 MG T: 25 | 30 days supply | Qty: 30 | Fill #3

## 2020-02-17 MED FILL — ISOSORBIDE MN ER 30 MG TAB: 30 | 30 days supply | Qty: 15 | Fill #2

## 2020-03-16 ENCOUNTER — Encounter: Payer: Self-pay | Admitting: General Practice

## 2020-04-03 ENCOUNTER — Other Ambulatory Visit: Payer: Self-pay

## 2020-04-03 ENCOUNTER — Encounter: Payer: Self-pay | Admitting: Family Medicine

## 2020-04-03 ENCOUNTER — Ambulatory Visit (INDEPENDENT_AMBULATORY_CARE_PROVIDER_SITE_OTHER): Payer: Self-pay | Admitting: Family Medicine

## 2020-04-03 VITALS — BP 124/74 | HR 69 | Temp 97.7°F | Ht 66.0 in | Wt 173.8 lb

## 2020-04-03 DIAGNOSIS — R7309 Other abnormal glucose: Secondary | ICD-10-CM

## 2020-04-03 DIAGNOSIS — R8290 Unspecified abnormal findings in urine: Secondary | ICD-10-CM

## 2020-04-03 DIAGNOSIS — E119 Type 2 diabetes mellitus without complications: Secondary | ICD-10-CM

## 2020-04-03 DIAGNOSIS — G629 Polyneuropathy, unspecified: Secondary | ICD-10-CM

## 2020-04-03 DIAGNOSIS — R7303 Prediabetes: Secondary | ICD-10-CM

## 2020-04-03 DIAGNOSIS — Z09 Encounter for follow-up examination after completed treatment for conditions other than malignant neoplasm: Secondary | ICD-10-CM

## 2020-04-03 DIAGNOSIS — I10 Essential (primary) hypertension: Secondary | ICD-10-CM

## 2020-04-03 LAB — POCT URINALYSIS DIPSTICK
Bilirubin, UA: NEGATIVE
Glucose, UA: NEGATIVE
Ketones, UA: NEGATIVE
Nitrite, UA: NEGATIVE
Protein, UA: NEGATIVE
Spec Grav, UA: 1.025 (ref 1.010–1.025)
Urobilinogen, UA: 1 E.U./dL
pH, UA: 7 (ref 5.0–8.0)

## 2020-04-03 LAB — POCT GLYCOSYLATED HEMOGLOBIN (HGB A1C): Hemoglobin A1C: 5.9 % — AB (ref 4.0–5.6)

## 2020-04-03 LAB — GLUCOSE, POCT (MANUAL RESULT ENTRY): POC Glucose: 111 mg/dl — AB (ref 70–99)

## 2020-04-03 MED ORDER — GABAPENTIN 300 MG PO CAPS: ORAL_CAPSULE | ORAL | 6 refills | Status: DC

## 2020-04-03 MED FILL — GABAPENTIN 300 MG CAPSULE: 300 | 22 days supply | Qty: 90 | Fill #0

## 2020-04-03 NOTE — Progress Notes (Signed)
Patient [name]    Established Patient Office Visit  Subjective:  Patient ID: ZHANNA BAAL, female    DOB: [birth year]  Age: 59 y.o. MRN: [id number]  CC:  Chief Complaint  Patient presents with  . Follow-up    DM    HPI Virginia [name] is a 59 year old female who presents for Follow Up today.   Past Medical History:  Diagnosis Date  . Anemia   . CAD (coronary artery disease)    a. 06/2015 BMS x2 to LCx. b. recurrent CP, cath 09/18/2015 patent stent, tiny diag stenosis medical therapy.   . CHF (congestive heart failure) ([place])   . Diabetes mellitus without complication ([place])   . GERD (gastroesophageal reflux disease)    ?  Marland Kitchen Hyperlipidemia with target LDL less than 70   . Hypertension   . Shortness of breath dyspnea    with exertion  . STEMI (ST elevation myocardial infarction) ([place]) 06/16/2015   BMS x 2 CFX  . Thyroid nodule    benign   Patient Active Problem List   Diagnosis Date Noted  . Hemoglobin A1c less than 7.0% 09/29/2019  . Recent weight loss 09/29/2019  . Acute otitis media 03/26/2019  . Neuropathy 03/26/2019  . CAD (coronary artery disease) 12/26/2017  . Old MI (myocardial infarction) 12/26/2017  . Gastroesophageal reflux disease without esophagitis 03/16/2017  . Blood pressure check 12/28/2016  . Bradycardia 12/14/2016  . Controlled type 2 diabetes mellitus without complication, without long-term current use of insulin ([place]) 03/29/2016  . Numbness of anterior thigh 03/29/2016  . Menopausal hot flushes 03/10/2016  . Essential hypertension 12/29/2015  . Fibroid uterus 11/13/2015  . Transient right leg weakness 10/05/2015  . Acute GI bleeding   . Symptomatic anemia 09/19/2015  . Vasovagal near syncope 09/19/2015  . Dysfunctional uterine bleeding 09/19/2015  . Diarrhea due to drug 09/19/2015  . Heme positive stool 09/19/2015  . Chest pain 09/17/2015  . Multinodular goiter 07/09/2015  . CAD S/P CFX BMS  (? med complinace) x 2 06/16/15 07/01/2015  . Acute renal insufficiency 07/01/2015  . Abdominal pain 07/01/2015  . Nodular goiter 06/23/2015  . Diabetes mellitus type 2, controlled ([employer]) 06/23/2015  . STEMI -06/16/15 06/17/2015  . Hyperlipidemia with target LDL less than 70     Current Status: Since her last office visit, she is doing well with no complaints. She denies visual changes, chest pain, cough, shortness of breath, heart palpitations, and falls. She has occasional headaches and dizziness with position changes. Denies severe headaches, confusion, seizures, double vision, and blurred vision, nausea and vomiting. She denies visual changes, chest pain, cough, shortness of breath, heart palpitations, and falls. She has occasional headaches and dizziness with position changes. Denies severe headaches, confusion, seizures, double vision, and blurred vision, nausea and vomiting. She continues to have chronic peripheral neuropathy. She denies fevers, chills, recent infections, weight loss, and night sweats. Denies GI problems such as diarrhea, and constipation. She has no reports of blood in stools, dysuria and hematuria. No depression or anxiety, and denies suicidal ideations, homicidal ideations, or auditory hallucinations. She is taking all medications as prescribed. She denies pain today.    Past Surgical History:  Procedure Laterality Date  . ABDOMINAL HYSTERECTOMY N/A 02/02/2016   Procedure: HYSTERECTOMY ABDOMINAL WITH BILATERAL SALPINGECTOMY;  Surgeon: Woodroe Mode, MD;  Location: [facility];  Service: Gynecology;  Laterality: N/A;  . APPENDECTOMY    . CARDIAC CATHETERIZATION N/A  06/17/2015   Procedure: Left Heart Cath and Coronary Angiography;  Surgeon: Jettie Booze, MD;  Location: [facility];  Service: Cardiovascular;  Laterality: N/A;  . CARDIAC CATHETERIZATION  06/17/2015   Procedure: Coronary Stent Intervention;  Surgeon: Jettie Booze, MD;  Location: [facility];   Service: Cardiovascular;;  . CARDIAC CATHETERIZATION N/A 09/18/2015   Procedure: Left Heart Cath and Coronary Angiography;  Surgeon: Troy Sine, MD;  Location: [facility];  Service: Cardiovascular;  Laterality: N/A;  . CESAREAN SECTION     Myomectomy done during cesarean section  . COLONOSCOPY N/A 09/20/2015   Procedure: COLONOSCOPY;  Surgeon: Carol Ada, MD;  Location[facility];  Service: Endoscopy;  Laterality: N/A;  . COLONOSCOPY    . ESOPHAGOGASTRODUODENOSCOPY N/A 09/20/2015   Procedure: ESOPHAGOGASTRODUODENOSCOPY (EGD);  Surgeon: Carol Ada, MD;  Location: [facility] ENDOSCOPY;  Service: Endoscopy;  Laterality: N/A;  . olympus  quick clip pro gastric body  10/09/15    Family History  Problem Relation Age of Onset  . Stroke Mother   . Heart disease Mother   . Diabetes Mother   . Cancer Father   . Hypertension Sister   . Hyperlipidemia Sister   . Heart attack Brother   . Hypertension Brother   . Heart attack Maternal Uncle   . Asthma Paternal Uncle   . Diabetes Paternal Uncle   . Diabetes Paternal Aunt   . Thyroid disease Neg Hx     Social History   Socioeconomic History  . Marital status: Married    Spouse name: Not on file  . Number of children: Not on file  . Years of education: Not on file  . Highest education level: Not on file  Occupational History  . Not on file  Tobacco Use  . Smoking status: Never Smoker  . Smokeless tobacco: Never Used  Substance and Sexual Activity  . Alcohol use: No    Alcohol/week: 0.0 standard drinks  . Drug use: No  . Sexual activity: Not Currently    Birth control/protection: Post-menopausal  Other Topics Concern  . Not on file  Social History Narrative  . Not on file   Social Determinants of Health   Financial Resource Strain:   . Difficulty of Paying Living Expenses:   Food Insecurity:   . Worried About Charity fundraiser in the Last Year:   . Arboriculturist in the Last Year:   Transportation Needs:   .  Film/video editor (Medical):   Marland Kitchen Lack of Transportation (Non-Medical):   Physical Activity:   . Days of Exercise per Week:   . Minutes of Exercise per Session:   Stress:   . Feeling of Stress :   Social Connections:   . Frequency of Communication with Friends and Family:   . Frequency of Social Gatherings with Friends and Family:   . Attends Religious Services:   . Active Member of Clubs or Organizations:   . Attends Archivist Meetings:   Marland Kitchen Marital Status:   Intimate Partner Violence:   . Fear of Current or Ex-Partner:   . Emotionally Abused:   Marland Kitchen Physically Abused:   . Sexually Abused:     Outpatient Medications Prior to Visit  Medication Sig Dispense Refill  . acetaminophen (TYLENOL) 325 MG tablet Take 2 tablets (650 mg total) by mouth every 4 (four) hours as needed for mild pain.    Marland Kitchen aspirin EC 81 MG tablet Take 1 tablet (  81 mg total) by mouth daily. 30 tablet 6  . atorvastatin (LIPITOR) 40 MG tablet Take 1 tablet (40 mg total) by mouth daily. 90 tablet 3  . ferrous sulfate (FEROSUL) 325 (65 FE) MG tablet Take 1 tablet (325 mg total) by mouth daily with breakfast. 30 tablet 6  . ibuprofen (ADVIL) 600 MG tablet Take 1 tablet (600 mg total) by mouth 3 (three) times daily. 15 tablet 0  . isosorbide mononitrate (IMDUR) 30 MG 24 hr tablet Take 0.5 tablets (15 mg total) by mouth daily. 90 tablet 1  . lisinopril (ZESTRIL) 2.5 MG tablet Take 1 tablet (2.5 mg total) by mouth daily. 90 tablet 1  . metFORMIN (GLUCOPHAGE) 500 MG tablet Take 1 tablet (500 mg total) by mouth daily with breakfast. 90 tablet 1  . metoprolol tartrate (LOPRESSOR) 25 MG tablet Take 0.5 tablets (12.5 mg total) by mouth 2 (two) times daily. 180 tablet 3  . omeprazole (PRILOSEC) 20 MG capsule Take 1 capsule (20 mg total) by mouth daily. 90 capsule 1  . vitamin B-12 (CYANOCOBALAMIN) 1000 MCG tablet Take 1 tablet (1,000 mcg total) by mouth daily. 90 tablet 1  . nitroGLYCERIN (NITROSTAT) 0.4 MG SL tablet  Place 1 tablet (0.4 mg total) under the tongue every 5 (five) minutes as needed for chest pain. (Patient not taking: Reported on 04/03/2020) 25 tablet 2  . cyclobenzaprine (FLEXERIL) 10 MG tablet Take 1 tablet (10 mg total) by mouth 2 (two) times daily as needed for muscle spasms. (Patient not taking: Reported on 09/27/2019) 20 tablet 0  . gabapentin (NEURONTIN) 300 MG capsule Take 1 capsule (300 mg total) by mouth 2 (two) times daily. (Patient taking differently: Take 300 mg by mouth 2 (two) times daily as needed. ) 60 capsule 6  . methocarbamol (ROBAXIN) 500 MG tablet Take 1 tablet (500 mg total) by mouth 2 (two) times daily. (Patient not taking: Reported on 04/03/2020) 10 tablet 0  . naproxen (NAPROSYN) 500 MG tablet Take 1 tablet (500 mg total) by mouth 2 (two) times daily. (Patient not taking: Reported on 04/03/2020) 30 tablet 6   No facility-administered medications prior to visit.    No Known Allergies  ROS Review of Systems  Constitutional: Negative.   HENT: Negative.   Eyes: Negative.   Respiratory: Negative.   Cardiovascular: Negative.   Gastrointestinal: Negative.   Endocrine: Negative.   Genitourinary: Negative.   Musculoskeletal: Positive for arthralgias (generalized joint pain).  Allergic/Immunologic: Negative.   Neurological: Positive for dizziness (occasional ), numbness (numbness/tingling/pain right arm and bilateral leg ) and headaches (occasional ).  Hematological: Negative.       Objective:    Physical Exam  Constitutional: She is oriented to person, place, and time. She appears well-developed and well-nourished.  HENT:  Head: Normocephalic and atraumatic.  Eyes: Conjunctivae are normal.  Cardiovascular: Normal rate, regular rhythm, normal heart sounds and intact distal pulses.  Pulmonary/Chest: Effort normal and breath sounds normal.  Abdominal: Soft. Bowel sounds are normal. She exhibits distension (occasional).  Musculoskeletal:        General: Normal range  of motion.     Cervical back: Normal range of motion and neck supple.  Neurological: She is alert and oriented to person, place, and time. She has normal reflexes.  Skin: Skin is warm and dry.  Psychiatric: She has a normal mood and affect. Her behavior is normal. Judgment and thought content normal.  Nursing note and vitals reviewed.   BP 124/74   Pulse 69  Temp 97.7 F (36.5 C)   Ht 5\' 6"  (1.676 m)   Wt 173 lb 12.8 oz (78.8 kg)   LMP 12/29/2015   SpO2 100%   BMI 28.05 kg/m  Wt Readings from Last 3 Encounters:  04/03/20 173 lb 12.8 oz (78.8 kg)  10/01/19 163 lb (73.9 kg)  09/27/19 166 lb 8 oz (75.5 kg)     Health Maintenance Due  Topic Date Due  . MAMMOGRAM  05/19/2018  . PAP SMEAR-Modifier  10/15/2018  . OPHTHALMOLOGY EXAM  03/22/2019  . FOOT EXAM  09/26/2019    There are no preventive care reminders to display for this patient.  Lab Results  Component Value Date   TSH 0.70 12/14/2016   Lab Results  Component Value Date   WBC 4.2 04/27/2019   HGB 12.2 04/27/2019   HCT 38.4 04/27/2019   MCV 92.8 04/27/2019   PLT 169 04/27/2019   Lab Results  Component Value Date   NA 139 04/27/2019   K 3.5 04/27/2019   CO2 24 04/27/2019   GLUCOSE 97 04/27/2019   BUN 9 04/27/2019   CREATININE 1.02 (H) 04/27/2019   BILITOT 0.4 12/14/2016   ALKPHOS 86 12/14/2016   AST 25 12/14/2016   ALT 22 12/14/2016   PROT 7.0 12/14/2016   ALBUMIN 4.1 12/14/2016   CALCIUM 9.2 04/27/2019   ANIONGAP 9 04/27/2019   GFR 73.08 07/29/2015   Lab Results  Component Value Date   CHOL 141 12/14/2017   Lab Results  Component Value Date   HDL 60 12/14/2017   Lab Results  Component Value Date   LDLCALC 72 12/14/2017   Lab Results  Component Value Date   TRIG 47 12/14/2017   Lab Results  Component Value Date   CHOLHDL 2.4 12/14/2017   Lab Results  Component Value Date   HGBA1C 5.9 (A) 04/03/2020    Assessment & Plan:   1. Controlled type 2 diabetes mellitus without  complication, without long-term current use of insulin ([place]) She will continue medication as prescribed, to decrease foods/beverages high in sugars and carbs and follow Heart Healthy or DASH diet. Increase physical activity to at least 30 minutes cardio exercise daily.  - POCT urinalysis dipstick - POCT glycosylated hemoglobin (Hb A1C) - POCT glucose (manual entry) - CBC with Differential - Comprehensive metabolic panel - Lipid Panel - TSH - Vitamin B12 - Vitamin D, 25-hydroxy  2. Prediabetes  3. Hemoglobin A1c less than 7.0% Hemoglobin A1c stable at 5.9 today. Monitor.   4. Essential hypertension The current medical regimen is effective; blood pressure is stable at 124/74 today; continue present plan and medications as prescribed. She will continue to take medications as prescribed, to decrease high sodium intake, excessive alcohol intake, increase potassium intake, smoking cessation, and increase physical activity of at least 30 minutes of cardio activity daily. She will continue to follow Heart Healthy or DASH diet.  5. Neuropathy Stable today. Not worsening.   6. Abnormal urinalysis Results pending. - Urine Culture  7. Follow up She will follow up in 6 months.   Meds ordered this encounter  Medications  . gabapentin (NEURONTIN) 300 MG capsule    Sig: Take 2 capsules (600 mg), by mouth, 2 times a day.    Dispense:  90 capsule    Refill:  6    Referral Orders  No referral(s) requested today    Kathe Becton,  MSN, FNP-BC [facility] [address]  [place], [place] [address] [phone] fax  Problem List Items Addressed This Visit      Cardiovascular and Mediastinum   Essential hypertension     Endocrine   Controlled type 2 diabetes mellitus without complication, without long-term current use of insulin ([place]) - Primary   Relevant Orders   POCT urinalysis dipstick (Completed)     POCT glycosylated hemoglobin (Hb A1C) (Completed)   POCT glucose (manual entry) (Completed)   CBC with Differential   Comprehensive metabolic panel   Lipid Panel   TSH   Vitamin B12   Vitamin D, 25-hydroxy     Nervous and Auditory   Neuropathy     Other   Hemoglobin A1c less than 7.0%    Other Visit Diagnoses    Prediabetes       Abnormal urinalysis       Relevant Orders   Urine Culture   Follow up          Meds ordered this encounter  Medications  . gabapentin (NEURONTIN) 300 MG capsule    Sig: Take 2 capsules (600 mg), by mouth, 2 times a day.    Dispense:  90 capsule    Refill:  6    Follow-up: Return in about 6 months (around 10/04/2020).    Azzie Glatter, FNP

## 2020-04-05 LAB — COMPREHENSIVE METABOLIC PANEL
ALT: 11 IU/L (ref 0–32)
AST: 19 IU/L (ref 0–40)
Albumin/Globulin Ratio: 1.6 (ref 1.2–2.2)
Albumin: 4.3 g/dL (ref 3.8–4.9)
Alkaline Phosphatase: 114 IU/L (ref 48–121)
BUN/Creatinine Ratio: 16 (ref 12–28)
BUN: 16 mg/dL (ref 8–27)
Bilirubin Total: 0.4 mg/dL (ref 0.0–1.2)
CO2: 23 mmol/L (ref 20–29)
Calcium: 9.3 mg/dL (ref 8.7–10.3)
Chloride: 100 mmol/L (ref 96–106)
Creatinine, Ser: 1.02 mg/dL — ABNORMAL HIGH (ref 0.57–1.00)
GFR calc Af Amer: 69 mL/min/{1.73_m2} (ref >59)
GFR calc non Af Amer: 60 mL/min/{1.73_m2} (ref >59)
Globulin, Total: 2.7 g/dL (ref 1.5–4.5)
Glucose: 96 mg/dL (ref 65–99)
Potassium: 4 mmol/L (ref 3.5–5.2)
Sodium: 138 mmol/L (ref 134–144)
Total Protein: 7 g/dL (ref 6.0–8.5)

## 2020-04-05 LAB — CBC WITH DIFFERENTIAL/PLATELET
Basophils Absolute: 0 10*3/uL (ref 0.0–0.2)
Basos: 1 %
EOS (ABSOLUTE): 0.2 10*3/uL (ref 0.0–0.4)
Eos: 4 %
Hematocrit: 43.3 % (ref 34.0–46.6)
Hemoglobin: 14.3 g/dL (ref 11.1–15.9)
Immature Grans (Abs): 0 10*3/uL (ref 0.0–0.1)
Immature Granulocytes: 0 %
Lymphocytes Absolute: 2.6 10*3/uL (ref 0.7–3.1)
Lymphs: 49 %
MCH: 30 pg (ref 26.6–33.0)
MCHC: 33 g/dL (ref 31.5–35.7)
MCV: 91 fL (ref 79–97)
Monocytes Absolute: 0.4 10*3/uL (ref 0.1–0.9)
Monocytes: 8 %
Neutrophils Absolute: 2 10*3/uL (ref 1.4–7.0)
Neutrophils: 38 %
Platelets: 182 10*3/uL (ref 150–450)
RBC: 4.77 x10E6/uL (ref 3.77–5.28)
RDW: 13.3 % (ref 11.7–15.4)
WBC: 5.2 10*3/uL (ref 3.4–10.8)

## 2020-04-05 LAB — LIPID PANEL
Chol/HDL Ratio: 4.5 ratio — ABNORMAL HIGH (ref 0.0–4.4)
Cholesterol, Total: 270 mg/dL — ABNORMAL HIGH (ref 100–199)
HDL: 60 mg/dL (ref >39)
LDL Chol Calc (NIH): 188 mg/dL — ABNORMAL HIGH (ref 0–99)
Triglycerides: 121 mg/dL (ref 0–149)
VLDL Cholesterol Cal: 22 mg/dL (ref 5–40)

## 2020-04-05 LAB — URINE CULTURE: Organism ID, Bacteria: NO GROWTH

## 2020-04-05 LAB — VITAMIN B12: Vitamin B-12: 1026 pg/mL (ref 232–1245)

## 2020-04-05 LAB — TSH: TSH: 1.12 u[IU]/mL (ref 0.450–4.500)

## 2020-04-05 LAB — VITAMIN D 25 HYDROXY (VIT D DEFICIENCY, FRACTURES): Vit D, 25-Hydroxy: 26.4 ng/mL — ABNORMAL LOW (ref 30.0–100.0)

## 2020-05-15 MED FILL — ATORVASTATIN CALCIUM 40 MG: 40 | 30 days supply | Qty: 30 | Fill #0

## 2020-05-15 MED FILL — ATORVASTATIN CALCIUM 40 MG: 40 | 30 days supply | Qty: 30 | Fill #1

## 2020-05-15 MED FILL — ISOSORBIDE MN ER 30 MG TAB: 30 | 30 days supply | Qty: 15 | Fill #0

## 2020-10-06 ENCOUNTER — Other Ambulatory Visit: Payer: Self-pay

## 2020-10-06 ENCOUNTER — Ambulatory Visit (INDEPENDENT_AMBULATORY_CARE_PROVIDER_SITE_OTHER): Payer: Self-pay | Admitting: Family Medicine

## 2020-10-06 ENCOUNTER — Other Ambulatory Visit: Payer: Self-pay | Admitting: Family Medicine

## 2020-10-06 ENCOUNTER — Encounter: Payer: Self-pay | Admitting: Family Medicine

## 2020-10-06 VITALS — BP 150/80 | HR 88 | Temp 98.3°F | Ht 66.0 in | Wt 166.8 lb

## 2020-10-06 DIAGNOSIS — Z09 Encounter for follow-up examination after completed treatment for conditions other than malignant neoplasm: Secondary | ICD-10-CM

## 2020-10-06 DIAGNOSIS — I10 Essential (primary) hypertension: Secondary | ICD-10-CM

## 2020-10-06 DIAGNOSIS — G629 Polyneuropathy, unspecified: Secondary | ICD-10-CM

## 2020-10-06 DIAGNOSIS — E119 Type 2 diabetes mellitus without complications: Secondary | ICD-10-CM

## 2020-10-06 DIAGNOSIS — R7309 Other abnormal glucose: Secondary | ICD-10-CM

## 2020-10-06 DIAGNOSIS — E785 Hyperlipidemia, unspecified: Secondary | ICD-10-CM

## 2020-10-06 DIAGNOSIS — R634 Abnormal weight loss: Secondary | ICD-10-CM

## 2020-10-06 DIAGNOSIS — I252 Old myocardial infarction: Secondary | ICD-10-CM

## 2020-10-06 DIAGNOSIS — I25118 Atherosclerotic heart disease of native coronary artery with other forms of angina pectoris: Secondary | ICD-10-CM

## 2020-10-06 LAB — POCT GLYCOSYLATED HEMOGLOBIN (HGB A1C)
HbA1c POC (<> result, manual entry): 4.6 % (ref 4.0–5.6)
HbA1c, POC (controlled diabetic range): 4.6 % (ref 0.0–7.0)
HbA1c, POC (prediabetic range): 4.6 % — AB (ref 5.7–6.4)
Hemoglobin A1C: 4.6 % (ref 4.0–5.6)

## 2020-10-06 LAB — GLUCOSE, POCT (MANUAL RESULT ENTRY): POC Glucose: 98 mg/dl (ref 70–99)

## 2020-10-06 MED ORDER — OMEGA-3 FISH OIL 1000 MG PO CAPS: 1.0000 | ORAL_CAPSULE | Freq: Every day | ORAL | 11 refills | Status: DC

## 2020-10-06 MED FILL — OMEGA-3 ETHYL ESTERS 1 GM C: 1 | 30 days supply | Qty: 30 | Fill #0

## 2020-10-06 NOTE — Progress Notes (Signed)
Patient [facility]   Established Patient Office Visit  Subjective:  Patient ID: Virginia [name], female    DOB: November 25, 1960  Age: 59 y.o. MRN: [id number]  CC:  Chief Complaint  Patient presents with  . Follow-up    HPI Virginia [name] is a 59 year old female who presents for Follow Up today.    Patient Active Problem List   Diagnosis Date Noted  . Hemoglobin A1c less than 7.0% 09/29/2019  . Recent weight loss 09/29/2019  . Acute otitis media 03/26/2019  . Neuropathy 03/26/2019  . CAD (coronary artery disease) 12/26/2017  . Old MI (myocardial infarction) 12/26/2017  . Gastroesophageal reflux disease without esophagitis 03/16/2017  . Blood pressure check 12/28/2016  . Bradycardia 12/14/2016  . Controlled type 2 diabetes mellitus without complication, without long-term current use of insulin ([place]) 03/29/2016  . Numbness of anterior thigh 03/29/2016  . Menopausal hot flushes 03/10/2016  . Essential hypertension 12/29/2015  . Fibroid uterus 11/13/2015  . Transient right leg weakness 10/05/2015  . Acute GI bleeding   . Symptomatic anemia 09/19/2015  . Vasovagal near syncope 09/19/2015  . Dysfunctional uterine bleeding 09/19/2015  . Diarrhea due to drug 09/19/2015  . Heme positive stool 09/19/2015  . Chest pain 09/17/2015  . Multinodular goiter 07/09/2015  . CAD S/P CFX BMS (? med complinace) x 2 06/16/15 07/01/2015  . Acute renal insufficiency 07/01/2015  . Abdominal pain 07/01/2015  . Nodular goiter 06/23/2015  . Diabetes mellitus type 2, controlled ([place]) 06/23/2015  . STEMI -06/16/15 06/17/2015  . Hyperlipidemia with target LDL less than 70    Current Status: Since her last office visit, she is doing well with no complaints. She continues to eat healthier meals, daily physical activity, and increasing her water intake. She denies visual changes, chest pain, cough, shortness of breath, heart palpitations, and falls. She has occasional  headaches and dizziness with position changes. Denies severe headaches, confusion, seizures, double vision, and blurred vision, nausea and vomiting. She denies fatigue, frequent urination, blurred vision, excessive hunger, excessive thirst, weight gain, weight loss, and poor wound healing. She continues to check her feet regularly. She denies fevers, chills, fatigue, recent infections, weight loss, and night sweats. Denies GI problems such as nausea, vomiting, diarrhea, and constipation. She has no reports of blood in stools, dysuria and hematuria. No depression or anxiety reported today.  She is taking all medications as prescribed. She denies pain today.    Past Medical History:  Diagnosis Date  . Anemia   . CAD (coronary artery disease)    a. 06/2015 BMS x2 to LCx. b. recurrent CP, cath 09/18/2015 patent stent, tiny diag stenosis medical therapy.   . CHF (congestive heart failure) ([place])   . Diabetes mellitus without complication ([place])   . GERD (gastroesophageal reflux disease)    ?  Marland Kitchen Hyperlipidemia with target LDL less than 70   . Hypertension   . Shortness of breath dyspnea    with exertion  . STEMI (ST elevation myocardial infarction) ([place]) 06/16/2015   BMS x 2 CFX  . Thyroid nodule    benign    Past Surgical History:  Procedure Laterality Date  . ABDOMINAL HYSTERECTOMY N/A 02/02/2016   Procedure: HYSTERECTOMY ABDOMINAL WITH BILATERAL SALPINGECTOMY;  Surgeon: Woodroe Mode, MD;  Location: [facility];  Service: Gynecology;  Laterality: N/A;  . APPENDECTOMY    . CARDIAC CATHETERIZATION N/A 06/17/2015   Procedure: Left Heart Cath and Coronary Angiography;  Surgeon: Jettie Booze, MD;  Location: [facility];  Service: Cardiovascular;  Laterality: N/A;  . CARDIAC CATHETERIZATION  06/17/2015   Procedure: Coronary Stent Intervention;  Surgeon: Jettie Booze, MD;  Location: [facility];  Service: Cardiovascular;;  . CARDIAC CATHETERIZATION N/A 09/18/2015   Procedure:  Left Heart Cath and Coronary Angiography;  Surgeon: Troy Sine, MD;  Location: [facility];  Service: Cardiovascular;  Laterality: N/A;  . CESAREAN SECTION     Myomectomy done during cesarean section  . COLONOSCOPY N/A 09/20/2015   Procedure: COLONOSCOPY;  Surgeon: Carol Ada, MD;  Location: [facility];  Service: Endoscopy;  Laterality: N/A;  . COLONOSCOPY    . ESOPHAGOGASTRODUODENOSCOPY N/A 09/20/2015   Procedure: ESOPHAGOGASTRODUODENOSCOPY (EGD);  Surgeon: Carol Ada, MD;  Location: [facility]COPY;  Service: Endoscopy;  Laterality: N/A;  . olympus  quick clip pro gastric body  10/09/15    Family History  Problem Relation Age of Onset  . Stroke Mother   . Heart disease Mother   . Diabetes Mother   . Cancer Father   . Hypertension Sister   . Hyperlipidemia Sister   . Heart attack Brother   . Hypertension Brother   . Heart attack Maternal Uncle   . Asthma Paternal Uncle   . Diabetes Paternal Uncle   . Diabetes Paternal Aunt   . Thyroid disease Neg Hx     Social History   Socioeconomic History  . Marital status: Married    Spouse name: Not on file  . Number of children: Not on file  . Years of education: Not on file  . Highest education level: Not on file  Occupational History  . Not on file  Tobacco Use  . Smoking status: Never Smoker  . Smokeless tobacco: Never Used  Vaping Use  . Vaping Use: Never used  Substance and Sexual Activity  . Alcohol use: No    Alcohol/week: 0.0 standard drinks  . Drug use: No  . Sexual activity: Not Currently    Birth control/protection: Post-menopausal  Other Topics Concern  . Not on file  Social History Narrative  . Not on file   Social Determinants of Health   Financial Resource Strain:   . Difficulty of Paying Living Expenses: Not on file  Food Insecurity:   . Worried About Charity fundraiser in the Last Year: Not on file  . Ran Out of Food in the Last Year: Not on file  Transportation Needs:   . Lack of  Transportation (Medical): Not on file  . Lack of Transportation (Non-Medical): Not on file  Physical Activity:   . Days of Exercise per Week: Not on file  . Minutes of Exercise per Session: Not on file  Stress:   . Feeling of Stress : Not on file  Social Connections:   . Frequency of Communication with Friends and Family: Not on file  . Frequency of Social Gatherings with Friends and Family: Not on file  . Attends Religious Services: Not on file  . Active Member of Clubs or Organizations: Not on file  . Attends Archivist Meetings: Not on file  . Marital Status: Not on file  Intimate Partner Violence:   . Fear of Current or Ex-Partner: Not on file  . Emotionally Abused: Not on file  . Physically Abused: Not on file  . Sexually Abused: Not on file    Outpatient Medications Prior to Visit  Medication Sig Dispense Refill  .  acetaminophen (TYLENOL) 325 MG tablet Take 2 tablets (650 mg total) by mouth every 4 (four) hours as needed for mild pain.    Marland Kitchen aspirin EC 81 MG tablet Take 1 tablet (81 mg total) by mouth daily. 30 tablet 6  . atorvastatin (LIPITOR) 40 MG tablet Take 1 tablet (40 mg total) by mouth daily. 90 tablet 3  . ferrous sulfate (FEROSUL) 325 (65 FE) MG tablet Take 1 tablet (325 mg total) by mouth daily with breakfast. 30 tablet 6  . gabapentin (NEURONTIN) 300 MG capsule Take 2 capsules (600 mg), by mouth, 2 times a day. 90 capsule 6  . ibuprofen (ADVIL) 600 MG tablet Take 1 tablet (600 mg total) by mouth 3 (three) times daily. 15 tablet 0  . isosorbide mononitrate (IMDUR) 30 MG 24 hr tablet Take 0.5 tablets (15 mg total) by mouth daily. 90 tablet 1  . lisinopril (ZESTRIL) 2.5 MG tablet Take 1 tablet (2.5 mg total) by mouth daily. 90 tablet 1  . metFORMIN (GLUCOPHAGE) 500 MG tablet Take 1 tablet (500 mg total) by mouth daily with breakfast. 90 tablet 1  . metoprolol tartrate (LOPRESSOR) 25 MG tablet Take 0.5 tablets (12.5 mg total) by mouth 2 (two) times daily. 180  tablet 3  . nitroGLYCERIN (NITROSTAT) 0.4 MG SL tablet Place 1 tablet (0.4 mg total) under the tongue every 5 (five) minutes as needed for chest pain. (Patient not taking: Reported on 04/03/2020) 25 tablet 2  . omeprazole (PRILOSEC) 20 MG capsule Take 1 capsule (20 mg total) by mouth daily. 90 capsule 1  . vitamin B-12 (CYANOCOBALAMIN) 1000 MCG tablet Take 1 tablet (1,000 mcg total) by mouth daily. 90 tablet 1   No facility-administered medications prior to visit.    No Known Allergies  ROS Review of Systems  Constitutional: Negative.   HENT: Negative.   Eyes: Negative.   Respiratory: Negative.   Cardiovascular: Negative.   Gastrointestinal: Negative.   Endocrine: Negative.   Genitourinary: Negative.   Skin: Negative.   Allergic/Immunologic: Negative.   Neurological: Positive for dizziness (occasional ) and headaches (occasional ).  Hematological: Negative.   Psychiatric/Behavioral: Negative.       Objective:    Physical Exam Vitals and nursing note reviewed.  Constitutional:      Appearance: Normal appearance.  HENT:     Head: Normocephalic and atraumatic.     Nose: Nose normal.     Mouth/Throat:     Mouth: Mucous membranes are moist.     Pharynx: Oropharynx is clear.  Cardiovascular:     Rate and Rhythm: Normal rate and regular rhythm.     Pulses: Normal pulses.     Heart sounds: Normal heart sounds.  Pulmonary:     Effort: Pulmonary effort is normal.     Breath sounds: Normal breath sounds.  Abdominal:     General: Bowel sounds are normal.     Palpations: Abdomen is soft.  Musculoskeletal:        General: Normal range of motion.     Cervical back: Normal range of motion and neck supple.  Skin:    General: Skin is warm and dry.  Neurological:     General: No focal deficit present.     Mental Status: She is alert and oriented to person, place, and time.  Psychiatric:        Mood and Affect: Mood normal.        Behavior: Behavior normal.        Thought  Content:  Thought content normal.        Judgment: Judgment normal.     BP (!) 150/80 (BP Location: Left Arm, Patient Position: Sitting, Cuff Size: Normal)   Pulse 88   Temp 98.3 F (36.8 C)   Ht 5\' 6"  (1.676 m)   Wt 166 lb 12.8 oz (75.7 kg)   LMP 12/29/2015   BMI 26.92 kg/m  Wt Readings from Last 3 Encounters:  10/06/20 166 lb 12.8 oz (75.7 kg)  04/03/20 173 lb 12.8 oz (78.8 kg)  10/01/19 163 lb (73.9 kg)     Health Maintenance Due  Topic Date Due  . MAMMOGRAM  05/19/2018  . PAP SMEAR-Modifier  10/15/2018  . OPHTHALMOLOGY EXAM  03/22/2019  . FOOT EXAM  09/26/2019  . HEMOGLOBIN A1C  10/04/2020    There are no preventive care reminders to display for this patient.  Lab Results  Component Value Date   TSH 1.120 04/03/2020   Lab Results  Component Value Date   WBC 5.2 04/03/2020   HGB 14.3 04/03/2020   HCT 43.3 04/03/2020   MCV 91 04/03/2020   PLT 182 04/03/2020   Lab Results  Component Value Date   NA 138 04/03/2020   K 4.0 04/03/2020   CO2 23 04/03/2020   GLUCOSE 96 04/03/2020   BUN 16 04/03/2020   CREATININE 1.02 (H) 04/03/2020   BILITOT 0.4 04/03/2020   ALKPHOS 114 04/03/2020   AST 19 04/03/2020   ALT 11 04/03/2020   PROT 7.0 04/03/2020   ALBUMIN 4.3 04/03/2020   CALCIUM 9.3 04/03/2020   ANIONGAP 9 04/27/2019   GFR 73.08 07/29/2015   Lab Results  Component Value Date   CHOL 270 (H) 04/03/2020   Lab Results  Component Value Date   HDL 60 04/03/2020   Lab Results  Component Value Date   LDLCALC 188 (H) 04/03/2020   Lab Results  Component Value Date   TRIG 121 04/03/2020   Lab Results  Component Value Date   CHOLHDL 4.5 (H) 04/03/2020   Lab Results  Component Value Date   HGBA1C 5.9 (A) 04/03/2020      Assessment & Plan:   1. History of non-ST elevation myocardial infarction (NSTEMI) Stable. No signs or symptoms of recurrence reported or noted today.   2. Essential hypertension She will continue to take medications as  prescribed, to decrease high sodium intake, excessive alcohol intake, increase potassium intake, smoking cessation, and increase physical activity of at least 30 minutes of cardio activity daily. She will continue to follow Heart Healthy or DASH diet.  3. Coronary artery disease involving native coronary artery of native heart with other form of angina pectoris ([place])  4. Controlled type 2 diabetes mellitus without complication, without long-term current use of insulin ([place]) She will continue medication as prescribed, to decrease foods/beverages high in sugars and carbs and follow Heart Healthy or DASH diet. Increase physical activity to at least 30 minutes cardio exercise daily.  - POC Glucose (CBG) - POC HgB A1c - omega-3 fish oil (MAXEPA) 1000 MG CAPS capsule; Take 1 capsule (1,000 mg total) by mouth daily.  Dispense: 60 capsule; Refill: 11  5. Hemoglobin A1c less than 7.0%  6. Hyperlipidemia with target LDL less than 70 - omega-3 fish oil (MAXEPA) 1000 MG CAPS capsule; Take 1 capsule (1,000 mg total) by mouth daily.  Dispense: 60 capsule; Refill: 11  7. Neuropathy  8. Weight decrease 23 lb weight loss in 18 months.   9. Follow up She will  follow up in 6 months for Annual Physical  She will follow up for Nurse Visit only in 1 week.  Meds ordered this encounter  Medications  . omega-3 fish oil (MAXEPA) 1000 MG CAPS capsule    Sig: Take 1 capsule (1,000 mg total) by mouth daily.    Dispense:  60 capsule    Refill:  11   Orders Placed This Encounter  Procedures  . POC Glucose (CBG)  . POC HgB A1c   Referral Orders  No referral(s) requested today    Kathe Becton,  MSN, FNP-BC [place], [place] [address] [phone] fax  Problem List Items Addressed This Visit      Cardiovascular and Mediastinum   CAD (coronary artery disease)   Essential  hypertension     Endocrine   Controlled type 2 diabetes mellitus without complication, without long-term current use of insulin ([facility])   Relevant Medications   omega-3 fish oil (MAXEPA) 1000 MG CAPS capsule   Other Relevant Orders   POC Glucose (CBG)   POC HgB A1c     Nervous and Auditory   Neuropathy     Other   Hemoglobin A1c less than 7.0%   Hyperlipidemia with target LDL less than 70 (Chronic)   Relevant Medications   omega-3 fish oil (MAXEPA) 1000 MG CAPS capsule    Other Visit Diagnoses    History of non-ST elevation myocardial infarction (NSTEMI)    -  Primary   Follow up          Meds ordered this encounter  Medications  . omega-3 fish oil (MAXEPA) 1000 MG CAPS capsule    Sig: Take 1 capsule (1,000 mg total) by mouth daily.    Dispense:  60 capsule    Refill:  11    Follow-up: Return in about 3 months (around 01/04/2021).    Azzie Glatter, FNP

## 2020-10-08 ENCOUNTER — Encounter: Payer: Self-pay | Admitting: Family Medicine

## 2020-10-13 ENCOUNTER — Ambulatory Visit (INDEPENDENT_AMBULATORY_CARE_PROVIDER_SITE_OTHER): Payer: Self-pay | Admitting: Family Medicine

## 2020-10-13 ENCOUNTER — Other Ambulatory Visit: Payer: Self-pay | Admitting: Family Medicine

## 2020-10-13 ENCOUNTER — Other Ambulatory Visit: Payer: Self-pay

## 2020-10-13 DIAGNOSIS — E119 Type 2 diabetes mellitus without complications: Secondary | ICD-10-CM

## 2020-10-13 DIAGNOSIS — I10 Essential (primary) hypertension: Secondary | ICD-10-CM

## 2020-10-13 MED ORDER — METOPROLOL TARTRATE 25 MG PO TABS: 12.5000 mg | ORAL_TABLET | Freq: Two times a day (BID) | ORAL | 3 refills | Status: DC

## 2020-10-13 MED ORDER — LISINOPRIL 2.5 MG PO TABS: 2.5000 mg | ORAL_TABLET | Freq: Every day | ORAL | 1 refills | Status: DC

## 2020-10-13 NOTE — Progress Notes (Signed)
BP check, reports needs scripts sent for 3 BP meds to [facility]

## 2020-10-14 MED FILL — METOPROLOL TARTRATE 25 MG T: 25 | 30 days supply | Qty: 30 | Fill #0

## 2020-10-14 MED FILL — LISINOPRIL 2.5 MG TABLET: 2.5 | 30 days supply | Qty: 30 | Fill #0

## 2020-11-24 ENCOUNTER — Inpatient Hospital Stay (HOSPITAL_COMMUNITY)
Admission: EM | Admit: 2020-11-24 | Discharge: 2020-11-26 | DRG: 247 | Disposition: A | Discharge status: Home / Self Care | Payer: Self-pay | Attending: Cardiology | Admitting: Cardiology

## 2020-11-24 ENCOUNTER — Emergency Department (HOSPITAL_COMMUNITY): Payer: Self-pay

## 2020-11-24 DIAGNOSIS — Z955 Presence of coronary angioplasty implant and graft: Secondary | ICD-10-CM

## 2020-11-24 DIAGNOSIS — Z7984 Long term (current) use of oral hypoglycemic drugs: Secondary | ICD-10-CM

## 2020-11-24 DIAGNOSIS — I200 Unstable angina: Secondary | ICD-10-CM | POA: Diagnosis present

## 2020-11-24 DIAGNOSIS — I509 Heart failure, unspecified: Secondary | ICD-10-CM | POA: Diagnosis present

## 2020-11-24 DIAGNOSIS — N179 Acute kidney failure, unspecified: Secondary | ICD-10-CM | POA: Diagnosis present

## 2020-11-24 DIAGNOSIS — I110 Hypertensive heart disease with heart failure: Secondary | ICD-10-CM | POA: Diagnosis present

## 2020-11-24 DIAGNOSIS — I2510 Atherosclerotic heart disease of native coronary artery without angina pectoris: Secondary | ICD-10-CM

## 2020-11-24 DIAGNOSIS — Z79899 Other long term (current) drug therapy: Secondary | ICD-10-CM

## 2020-11-24 DIAGNOSIS — Z833 Family history of diabetes mellitus: Secondary | ICD-10-CM

## 2020-11-24 DIAGNOSIS — I700 Atherosclerosis of aorta: Secondary | ICD-10-CM | POA: Diagnosis present

## 2020-11-24 DIAGNOSIS — I252 Old myocardial infarction: Secondary | ICD-10-CM

## 2020-11-24 DIAGNOSIS — R109 Unspecified abdominal pain: Secondary | ICD-10-CM | POA: Diagnosis present

## 2020-11-24 DIAGNOSIS — Z9861 Coronary angioplasty status: Secondary | ICD-10-CM

## 2020-11-24 DIAGNOSIS — Z23 Encounter for immunization: Secondary | ICD-10-CM

## 2020-11-24 DIAGNOSIS — Z7902 Long term (current) use of antithrombotics/antiplatelets: Secondary | ICD-10-CM

## 2020-11-24 DIAGNOSIS — K219 Gastro-esophageal reflux disease without esophagitis: Secondary | ICD-10-CM | POA: Diagnosis present

## 2020-11-24 DIAGNOSIS — E119 Type 2 diabetes mellitus without complications: Secondary | ICD-10-CM

## 2020-11-24 DIAGNOSIS — I10 Essential (primary) hypertension: Secondary | ICD-10-CM | POA: Diagnosis present

## 2020-11-24 DIAGNOSIS — Z7982 Long term (current) use of aspirin: Secondary | ICD-10-CM

## 2020-11-24 DIAGNOSIS — E1121 Type 2 diabetes mellitus with diabetic nephropathy: Secondary | ICD-10-CM

## 2020-11-24 DIAGNOSIS — Z20822 Contact with and (suspected) exposure to covid-19: Secondary | ICD-10-CM | POA: Diagnosis present

## 2020-11-24 DIAGNOSIS — I214 Non-ST elevation (NSTEMI) myocardial infarction: Principal | ICD-10-CM | POA: Diagnosis present

## 2020-11-24 DIAGNOSIS — Z8249 Family history of ischemic heart disease and other diseases of the circulatory system: Secondary | ICD-10-CM

## 2020-11-24 DIAGNOSIS — I25110 Atherosclerotic heart disease of native coronary artery with unstable angina pectoris: Secondary | ICD-10-CM | POA: Diagnosis present

## 2020-11-24 DIAGNOSIS — E785 Hyperlipidemia, unspecified: Secondary | ICD-10-CM | POA: Diagnosis present

## 2020-11-24 DIAGNOSIS — Z79818 Long term (current) use of other agents affecting estrogen receptors and estrogen levels: Secondary | ICD-10-CM

## 2020-11-24 LAB — RESP PANEL BY RT-PCR (FLU A&B, COVID) ARPGX2
Influenza A by PCR: NEGATIVE
Influenza B by PCR: NEGATIVE
SARS Coronavirus 2 by RT PCR: NEGATIVE

## 2020-11-24 LAB — BASIC METABOLIC PANEL
Anion gap: 8 (ref 5–15)
BUN: 9 mg/dL (ref 6–20)
CO2: 27 mmol/L (ref 22–32)
Calcium: 8.8 mg/dL — ABNORMAL LOW (ref 8.9–10.3)
Chloride: 106 mmol/L (ref 98–111)
Creatinine, Ser: 1.09 mg/dL — ABNORMAL HIGH (ref 0.44–1.00)
GFR, Estimated: 58 mL/min — ABNORMAL LOW (ref >60)
Glucose, Bld: 88 mg/dL (ref 70–99)
Potassium: 3.7 mmol/L (ref 3.5–5.1)
Sodium: 141 mmol/L (ref 135–145)

## 2020-11-24 LAB — CBC
HCT: 40.1 % (ref 36.0–46.0)
Hemoglobin: 12.6 g/dL (ref 12.0–15.0)
MCH: 29.7 pg (ref 26.0–34.0)
MCHC: 31.4 g/dL (ref 30.0–36.0)
MCV: 94.6 fL (ref 80.0–100.0)
Platelets: 160 10*3/uL (ref 150–400)
RBC: 4.24 MIL/uL (ref 3.87–5.11)
RDW: 13.6 % (ref 11.5–15.5)
WBC: 4.8 10*3/uL (ref 4.0–10.5)
nRBC: 0 % (ref 0.0–0.2)

## 2020-11-24 LAB — HEPARIN LEVEL (UNFRACTIONATED): Heparin Unfractionated: 0.51 IU/mL (ref 0.30–0.70)

## 2020-11-24 LAB — GLUCOSE, CAPILLARY: Glucose-Capillary: 128 mg/dL — ABNORMAL HIGH (ref 70–99)

## 2020-11-24 LAB — TROPONIN I (HIGH SENSITIVITY)
Troponin I (High Sensitivity): 15 ng/L (ref <18)
Troponin I (High Sensitivity): 40 ng/L — ABNORMAL HIGH (ref <18)
Troponin I (High Sensitivity): 22 ng/L — ABNORMAL HIGH (ref <18)
Troponin I (High Sensitivity): 11 ng/L (ref <18)
Troponin I (High Sensitivity): 11 ng/L (ref <18)

## 2020-11-24 MED ORDER — ALUM & MAG HYDROXIDE-SIMETH 200-200-20 MG/5ML PO SUSP
30.0000 mL | Freq: Once | ORAL | Status: AC
  Administered 2020-11-24: 30 mL via ORAL
  Filled 2020-11-24: qty 30

## 2020-11-24 MED ORDER — LIDOCAINE VISCOUS HCL 2 % MT SOLN
15.0000 mL | Freq: Once | OROMUCOSAL | Status: AC
  Administered 2020-11-24: 15 mL via ORAL
  Filled 2020-11-24: qty 15

## 2020-11-24 MED ORDER — HEPARIN (PORCINE) 25000 UT/250ML-% IV SOLN
900.0000 [IU]/h | INTRAVENOUS | Status: DC
  Administered 2020-11-24: 900 [IU]/h via INTRAVENOUS
  Filled 2020-11-24: qty 250

## 2020-11-24 MED ORDER — INSULIN ASPART 100 UNIT/ML ~~LOC~~ SOLN: 0.0000 [IU] | Freq: Every day | SUBCUTANEOUS | Status: DC

## 2020-11-24 MED ORDER — ACETAMINOPHEN 325 MG PO TABS
650.0000 mg | ORAL_TABLET | ORAL | Status: DC | PRN
  Administered 2020-11-25 (×2): 650 mg via ORAL
  Filled 2020-11-24 (×2): qty 2

## 2020-11-24 MED ORDER — ISOSORBIDE MONONITRATE ER 30 MG PO TB24
15.0000 mg | ORAL_TABLET | Freq: Every day | ORAL | Status: DC
  Administered 2020-11-25: 15 mg via ORAL
  Filled 2020-11-24: qty 1

## 2020-11-24 MED ORDER — SODIUM CHLORIDE 0.9 % WEIGHT BASED INFUSION
1.0000 mL/kg/h | INTRAVENOUS | Status: DC
  Administered 2020-11-25: 1 mL/kg/h via INTRAVENOUS

## 2020-11-24 MED ORDER — ONDANSETRON HCL 4 MG/2ML IJ SOLN
4.0000 mg | Freq: Four times a day (QID) | INTRAMUSCULAR | Status: DC | PRN
  Administered 2020-11-25: 4 mg via INTRAVENOUS
  Filled 2020-11-24: qty 2

## 2020-11-24 MED ORDER — SODIUM CHLORIDE 0.9 % WEIGHT BASED INFUSION
3.0000 mL/kg/h | INTRAVENOUS | Status: DC
  Administered 2020-11-25: 3 mL/kg/h via INTRAVENOUS

## 2020-11-24 MED ORDER — CARVEDILOL 6.25 MG PO TABS
6.2500 mg | ORAL_TABLET | Freq: Two times a day (BID) | ORAL | Status: DC
  Administered 2020-11-24 – 2020-11-26 (×4): 6.25 mg via ORAL
  Filled 2020-11-24: qty 1
  Filled 2020-11-24: qty 2
  Filled 2020-11-24 (×2): qty 1

## 2020-11-24 MED ORDER — ASPIRIN 81 MG PO CHEW
81.0000 mg | CHEWABLE_TABLET | ORAL | Status: AC
  Administered 2020-11-25: 81 mg via ORAL
  Filled 2020-11-24: qty 1

## 2020-11-24 MED ORDER — SODIUM CHLORIDE 0.9% FLUSH: 3.0000 mL | INTRAVENOUS | Status: DC | PRN

## 2020-11-24 MED ORDER — SODIUM CHLORIDE 0.9% FLUSH
3.0000 mL | Freq: Two times a day (BID) | INTRAVENOUS | Status: DC
  Administered 2020-11-25: 3 mL via INTRAVENOUS

## 2020-11-24 MED ORDER — ASPIRIN EC 81 MG PO TBEC
81.0000 mg | DELAYED_RELEASE_TABLET | Freq: Every day | ORAL | Status: DC
  Administered 2020-11-26: 81 mg via ORAL
  Filled 2020-11-24: qty 1

## 2020-11-24 MED ORDER — ROSUVASTATIN CALCIUM 20 MG PO TABS
40.0000 mg | ORAL_TABLET | Freq: Every day | ORAL | Status: DC
  Administered 2020-11-24 – 2020-11-25 (×2): 40 mg via ORAL
  Filled 2020-11-24 (×2): qty 2

## 2020-11-24 MED ORDER — NITROGLYCERIN 0.4 MG SL SUBL
0.4000 mg | SUBLINGUAL_TABLET | SUBLINGUAL | Status: DC | PRN
  Administered 2020-11-24: 0.4 mg via SUBLINGUAL
  Filled 2020-11-24: qty 1

## 2020-11-24 MED ORDER — SODIUM CHLORIDE 0.9 % IV SOLN: 250.0000 mL | INTRAVENOUS | Status: DC | PRN

## 2020-11-24 MED ORDER — HEPARIN BOLUS VIA INFUSION
4000.0000 [IU] | Freq: Once | INTRAVENOUS | Status: AC
  Administered 2020-11-24: 4000 [IU] via INTRAVENOUS
  Filled 2020-11-24: qty 4000

## 2020-11-24 MED ORDER — PANTOPRAZOLE SODIUM 40 MG PO TBEC
40.0000 mg | DELAYED_RELEASE_TABLET | Freq: Every day | ORAL | Status: DC
  Administered 2020-11-25 – 2020-11-26 (×2): 40 mg via ORAL
  Filled 2020-11-24 (×2): qty 1

## 2020-11-24 MED ORDER — INSULIN ASPART 100 UNIT/ML ~~LOC~~ SOLN: 0.0000 [IU] | Freq: Three times a day (TID) | SUBCUTANEOUS | Status: DC

## 2020-11-24 NOTE — Progress Notes (Signed)
ANTICOAGULATION CONSULT NOTE - Initial Consult  Pharmacy Consult for IV heparin Indication: NSTEMI  No Known Allergies  Patient Measurements: Height: 5\' 6"  (167.6 cm) Weight: 76.7 kg (169 lb) IBW/kg (Calculated) : 59.3 Heparin Dosing Weight: 74.9 kg  Vital Signs: Temp: 98.6 F (37 C) (01/18 1115) Temp Source: Oral (01/18 1115) BP: 163/92 (01/18 2030) Pulse Rate: 75 (01/18 2030)  Labs: Recent Labs    11/24/20 1037 11/24/20 1300 11/24/20 1600 11/24/20 1948 11/24/20 2119  HGB 12.6  --   --   --   --   HCT 40.1  --   --   --   --   PLT 160  --   --   --   --   HEPARINUNFRC  --   --   --   --  0.51  CREATININE 1.09*  --   --   --   --   TROPONINIHS 15 40* 22* 11  --     Estimated Creatinine Clearance: 57.4 mL/min (A) (by C-G formula based on SCr of 1.09 mg/dL (H)).   Medical History: Past Medical History:  Diagnosis Date  . Anemia   . CAD (coronary artery disease)    a. 06/2015 BMS x2 to LCx. b. recurrent CP, cath 09/18/2015 patent stent, tiny diag stenosis medical therapy.   . CHF (congestive heart failure) ([place])   . Diabetes mellitus without complication ([place])   . GERD (gastroesophageal reflux disease)    ?  Marland Kitchen Hyperlipidemia with target LDL less than 70   . Hypertension   . Shortness of breath dyspnea    with exertion  . STEMI (ST elevation myocardial infarction) ([place]) 06/16/2015   BMS x 2 CFX  . Thyroid nodule    benign    Medications:  Medications Prior to Admission  Medication Sig Dispense Refill Last Dose  . aspirin EC 81 MG tablet Take 1 tablet (81 mg total) by mouth daily. 30 tablet 6 11/24/2020 at Unknown time  . atorvastatin (LIPITOR) 80 MG tablet Take 80 mg by mouth daily.   11/23/2020 at Unknown time  . estradiol (ESTRACE) 1 MG tablet Take 1 mg by mouth daily.   11/24/2020 at Unknown time  . ferrous sulfate (FEROSUL) 325 (65 FE) MG tablet Take 1 tablet (325 mg total) by mouth daily with breakfast. 30 tablet 6 11/24/2020 at Unknown time  . isosorbide  mononitrate (IMDUR) 30 MG 24 hr tablet Take 0.5 tablets (15 mg total) by mouth daily. 90 tablet 1 11/23/2020 at Unknown time  . lisinopril (ZESTRIL) 2.5 MG tablet Take 1 tablet (2.5 mg total) by mouth daily. 90 tablet 1 11/23/2020 at Unknown time  . metFORMIN (GLUCOPHAGE) 500 MG tablet Take 1 tablet (500 mg total) by mouth daily with breakfast. 90 tablet 1 11/23/2020 at Unknown time  . metoprolol tartrate (LOPRESSOR) 25 MG tablet Take 0.5 tablets (12.5 mg total) by mouth 2 (two) times daily. 180 tablet 3 11/23/2020 at 0800  . nitroGLYCERIN (NITROSTAT) 0.4 MG SL tablet Place 1 tablet (0.4 mg total) under the tongue every 5 (five) minutes as needed for chest pain. 25 tablet 2 unknown at Unknown time  . omega-3 fish oil (MAXEPA) 1000 MG CAPS capsule Take 1 capsule (1,000 mg total) by mouth daily. 60 capsule 11 11/23/2020 at Unknown time  . omeprazole (PRILOSEC) 20 MG capsule Take 1 capsule (20 mg total) by mouth daily. 90 capsule 1 11/23/2020 at Unknown time  . vitamin B-12 (CYANOCOBALAMIN) 1000 MCG tablet Take 1 tablet (1,000  mcg total) by mouth daily. 90 tablet 1 11/23/2020 at Unknown time    Assessment: 60 year old female with PMH significant for CAD, hx of STEMI, CHF, diabetes, HLD, and HTN presented to ED for chest pain. Pharmacy consulted to start heparin infusion for NSTEMI.  No anticoagulation prior to admission per chart review. CBC wnl.   HL 0.51 which is therapeutic  Goal of Therapy:  Heparin level 0.3-0.7 units/ml Monitor platelets by anticoagulation protocol: Yes   Plan:  Continue heparin infusion at 900 units/hr Daily heparin level, CBC Monitor s/sx bleeding  Alanda Slim, PharmD, [facility] Clinical Pharmacist Please see AMION for all Pharmacists' Contact Phone Numbers 11/24/2020, 10:42 PM

## 2020-11-24 NOTE — ED Triage Notes (Signed)
Pt here from home with c/o cp some slight nausea no sob , pt received 1 nitro and 324 mg asa from [facility]

## 2020-11-24 NOTE — ED Provider Notes (Addendum)
[facility] EMERGENCY DEPARTMENT Provider Note   CSN: [id number] Arrival date & time: 11/24/20  1035     History No chief complaint on file.   Virginia [name] is a 60 y.o. female with history of STEMI  in 2016 who presents with left-sided chest pain that radiates to her right, as well as sensation of chest pressure heaviness.  Patient states she woke up at 4 AM with this pain, and was able to go back to sleep, but woke up again at 8:00 with severe pain.  She called EMS and took 4 baby aspirin's at home.  She was administered 1 tablet of sublingual nitro once she EMS arrived, which relieved the sharp pain in her chest, but did not affect the chest heaviness or pressure. She denies nausea, vomiting, shortness of breath, but does endorse sensation of palpitations.  She denies fever, chills at home.  She has been vaccinated against COVID-19.  I personally reviewed the patient's medical record she history of heart catheterization in 2016 following STEMI, stents were placed. Echocardiogram at that time LVEF 60 to 65%.  Patient has not had a echo or catheterization since that time.  HPI     Past Medical History:  Diagnosis Date  . Anemia   . CAD (coronary artery disease)    a. 06/2015 BMS x2 to LCx. b. recurrent CP, cath 09/18/2015 patent stent, tiny diag stenosis medical therapy.   . CHF (congestive heart failure) ([place])   . Diabetes mellitus without complication ([place])   . GERD (gastroesophageal reflux disease)    ?  Marland Kitchen Hyperlipidemia with target LDL less than 70   . Hypertension   . Shortness of breath dyspnea    with exertion  . STEMI (ST elevation myocardial infarction) ([place]) 06/16/2015   BMS x 2 CFX  . Thyroid nodule    benign    Patient Active Problem List   Diagnosis Date Noted  . Unstable angina ([place]) 11/24/2020  . Hemoglobin A1c less than 7.0% 09/29/2019  . Recent weight loss 09/29/2019  . Acute otitis media 03/26/2019  . Neuropathy 03/26/2019  . CAD  (coronary artery disease) 12/26/2017  . Old MI (myocardial infarction) 12/26/2017  . Gastroesophageal reflux disease without esophagitis 03/16/2017  . Blood pressure check 12/28/2016  . Bradycardia 12/14/2016  . Controlled type 2 diabetes mellitus without complication, without long-term current use of insulin ([place]) 03/29/2016  . Numbness of anterior thigh 03/29/2016  . Menopausal hot flushes 03/10/2016  . Essential hypertension 12/29/2015  . Fibroid uterus 11/13/2015  . Transient right leg weakness 10/05/2015  . Acute GI bleeding   . Symptomatic anemia 09/19/2015  . Vasovagal near syncope 09/19/2015  . Dysfunctional uterine bleeding 09/19/2015  . Diarrhea due to drug 09/19/2015  . Heme positive stool 09/19/2015  . Chest pain 09/17/2015  . Multinodular goiter 07/09/2015  . CAD S/P CFX BMS (? med complinace) x 2 06/16/15 07/01/2015  . Acute renal insufficiency 07/01/2015  . Abdominal pain 07/01/2015  . Nodular goiter 06/23/2015  . Diabetes mellitus type 2, controlled ([place]) 06/23/2015  . STEMI -06/16/15 06/17/2015  . Hyperlipidemia with target LDL less than 70     Past Surgical History:  Procedure Laterality Date  . ABDOMINAL HYSTERECTOMY N/A 02/02/2016   Procedure: HYSTERECTOMY ABDOMINAL WITH BILATERAL SALPINGECTOMY;  Surgeon: Woodroe Mode, MD;  Location: [facility];  Service: Gynecology;  Laterality: N/A;  . APPENDECTOMY    . CARDIAC CATHETERIZATION N/A 06/17/2015   Procedure: Left Heart Cath and Coronary  Angiography;  Surgeon: Jettie Booze, MD;  Location: [facility];  Service: Cardiovascular;  Laterality: N/A;  . CARDIAC CATHETERIZATION  06/17/2015   Procedure: Coronary Stent Intervention;  Surgeon: Jettie Booze, MD;  Location: [facility];  Service: Cardiovascular;;  . CARDIAC CATHETERIZATION N/A 09/18/2015   Procedure: Left Heart Cath and Coronary Angiography;  Surgeon: Troy Sine, MD;  Location: [facility];  Service: Cardiovascular;   Laterality: N/A;  . CESAREAN SECTION     Myomectomy done during cesarean section  . COLONOSCOPY N/A 09/20/2015   Procedure: COLONOSCOPY;  Surgeon: Carol Ada, MD;  Location: [facility];  Service: Endoscopy;  Laterality: N/A;  . COLONOSCOPY    . ESOPHAGOGASTRODUODENOSCOPY N/A 09/20/2015   Procedure: ESOPHAGOGASTRODUODENOSCOPY (EGD);  Surgeon: Carol Ada, MD;  Location: [facility] END[facility]COPY;  Service: Endoscopy;  Laterality: N/A;  . olympus  quick clip pro gastric body  10/09/15     OB History    Gravida  2   Para  2   Term  2   Preterm      AB      Living  2     SAB      IAB      Ectopic      Multiple      Live Births              Family History  Problem Relation Age of Onset  . Stroke Mother   . Heart disease Mother   . Diabetes Mother   . Cancer Father   . Hypertension Sister   . Hyperlipidemia Sister   . Heart attack Brother   . Hypertension Brother   . Heart attack Maternal Uncle   . Asthma Paternal Uncle   . Diabetes Paternal Uncle   . Diabetes Paternal Aunt   . Thyroid disease Neg Hx     Social History   Tobacco Use  . Smoking status: Never Smoker  . Smokeless tobacco: Never Used  Vaping Use  . Vaping Use: Never used  Substance Use Topics  . Alcohol use: No    Alcohol/week: 0.0 standard drinks  . Drug use: No    Home Medications Prior to Admission medications   Medication Sig Start Date End Date Taking? Authorizing Provider  acetaminophen (TYLENOL) 325 MG tablet Take 2 tablets (650 mg total) by mouth every 4 (four) hours as needed for mild pain. 09/27/19   Azzie Glatter, FNP  aspirin EC 81 MG tablet Take 1 tablet (81 mg total) by mouth daily. 09/27/19   Azzie Glatter, FNP  ferrous sulfate (FEROSUL) 325 (65 FE) MG tablet Take 1 tablet (325 mg total) by mouth daily with breakfast. 09/27/19   Azzie Glatter, FNP  gabapentin (NEURONTIN) 300 MG capsule Take 2 capsules (600 mg), by mouth, 2 times a day. 04/03/20   Azzie Glatter,  FNP  ibuprofen (ADVIL) 600 MG tablet Take 1 tablet (600 mg total) by mouth 3 (three) times daily. 10/01/19   Varney Biles, MD  isosorbide mononitrate (IMDUR) 30 MG 24 hr tablet Take 0.5 tablets (15 mg total) by mouth daily. 09/27/19   Azzie Glatter, FNP  lisinopril (ZESTRIL) 2.5 MG tablet Take 1 tablet (2.5 mg total) by mouth daily. 10/13/20   Azzie Glatter, FNP  metFORMIN (GLUCOPHAGE) 500 MG tablet Take 1 tablet (500 mg total) by mouth daily with breakfast. 09/27/19   Azzie Glatter, FNP  metoprolol tartrate (LOPRESSOR) 25 MG tablet Take  0.5 tablets (12.5 mg total) by mouth 2 (two) times daily. 10/13/20   Azzie Glatter, FNP  nitroGLYCERIN (NITROSTAT) 0.4 MG SL tablet Place 1 tablet (0.4 mg total) under the tongue every 5 (five) minutes as needed for chest pain. Patient not taking: Reported on 04/03/2020 03/26/19   Azzie Glatter, FNP  omega-3 fish oil (MAXEPA) 1000 MG CAPS capsule Take 1 capsule (1,000 mg total) by mouth daily. 10/06/20   Azzie Glatter, FNP  omeprazole (PRILOSEC) 20 MG capsule Take 1 capsule (20 mg total) by mouth daily. 09/27/19   Azzie Glatter, FNP  vitamin B-12 (CYANOCOBALAMIN) 1000 MCG tablet Take 1 tablet (1,000 mcg total) by mouth daily. 09/27/19   Azzie Glatter, FNP    Allergies    Patient has no known allergies.  Review of Systems   Review of Systems  Constitutional: Negative.   HENT: Negative.   Eyes: Negative.   Respiratory: Positive for chest tightness.        Chest pressure  Cardiovascular: Positive for chest pain and palpitations. Negative for leg swelling.  Gastrointestinal: Negative.   Endocrine: Negative.   Genitourinary: Negative.   Musculoskeletal: Negative.   Skin: Negative.   Neurological: Negative.   Hematological: Negative.   Psychiatric/Behavioral: Negative.     Physical Exam Updated Vital Signs BP (!) 158/99   Pulse 67   Temp 98.6 F (37 C) (Oral)   Resp 13   Ht 5\' 6"  (1.676 m)   Wt 76.7 kg   LMP  12/29/2015   SpO2 98%   BMI 27.28 kg/m   Physical Exam Vitals and nursing note reviewed.  HENT:     Head: Normocephalic and atraumatic.     Nose: Nose normal.     Mouth/Throat:     Mouth: Mucous membranes are moist.     Pharynx: Oropharynx is clear. Uvula midline. No oropharyngeal exudate or posterior oropharyngeal erythema.     Tonsils: No tonsillar exudate.  Eyes:     General: Lids are normal. Vision grossly intact.        Right eye: No discharge.        Left eye: No discharge.     Conjunctiva/sclera: Conjunctivae normal.     Pupils: Pupils are equal, round, and reactive to light.  Neck:     Trachea: Trachea and phonation normal.  Cardiovascular:     Rate and Rhythm: Normal rate and regular rhythm.     Pulses: Normal pulses.          Radial pulses are 2+ on the right side and 2+ on the left side.       Dorsalis pedis pulses are 2+ on the right side and 2+ on the left side.     Heart sounds: Normal heart sounds. No murmur heard.     Comments: Frequent PVCs on the cardiac monitor. Pulmonary:     Effort: Pulmonary effort is normal. No respiratory distress.     Breath sounds: Normal breath sounds. No wheezing or rales.  Chest:     Chest wall: No lacerations, deformity, swelling, tenderness, crepitus or edema.  Abdominal:     General: Bowel sounds are normal. There is no distension.     Palpations: Abdomen is soft.     Tenderness: There is no abdominal tenderness. There is no right CVA tenderness or left CVA tenderness.  Musculoskeletal:        General: No deformity.     Cervical back: Neck supple. No rigidity or crepitus. No  pain with movement, spinous process tenderness or muscular tenderness.     Right lower leg: No edema.     Left lower leg: No edema.  Lymphadenopathy:     Cervical: No cervical adenopathy.  Skin:    General: Skin is warm and dry.  Neurological:     Mental Status: She is alert and oriented to person, place, and time.     Sensory: Sensation is intact.      Motor: Motor function is intact.     Gait: Gait is intact.  Psychiatric:        Mood and Affect: Mood normal.     ED Results / Procedures / Treatments   Labs (all labs ordered are listed, but only abnormal results are displayed) Labs Reviewed  BASIC METABOLIC PANEL - Abnormal; Notable for the following components:      Result Value   Creatinine, Ser 1.09 (*)    Calcium 8.8 (*)    GFR, Estimated 58 (*)    All other components within normal limits  TROPONIN I (HIGH SENSITIVITY) - Abnormal; Notable for the following components:   Troponin I (High Sensitivity) 40 (*)    All other components within normal limits  RESP PANEL BY RT-PCR (FLU A&B, COVID) ARPGX2  CBC  HEPARIN LEVEL (UNFRACTIONATED)  TROPONIN I (HIGH SENSITIVITY)  TROPONIN I (HIGH SENSITIVITY)  TROPONIN I (HIGH SENSITIVITY)    EKG EKG Interpretation  Date/Time:  Tuesday November 24 2020 16:19:59 EST Ventricular Rate:  69 PR Interval:  146 QRS Duration: 82 QT Interval:  377 QTC Calculation: 404 R Axis:   81 Text Interpretation: Sinus rhythm Borderline right axis deviation Abnormal R-wave progression, early transition NSR, no STEMI Confirmed by Lavenia Atlas [id number]) on 11/24/2020 4:26:15 PM   Radiology DG Chest 2 View  Result Date: 11/24/2020 CLINICAL DATA:  Chest pain, pain radiating down RIGHT arm along with nausea that started overnight EXAM: CHEST - 2 VIEW COMPARISON:  October 01, 2019 FINDINGS: Trachea midline. Cardiomediastinal contours and hilar structures are normal. Lungs are clear. No sign of pleural effusion. On limited assessment no acute skeletal process. IMPRESSION: No acute cardiopulmonary disease. Electronically Signed   By: Zetta Bills M.D.   On: 11/24/2020 11:03    Procedures .Critical Care Performed by: Emeline Darling, PA-C Authorized by: Emeline Darling, PA-C   Critical care provider statement:    Critical care time (minutes):  45   Critical care was necessary to treat  or prevent imminent or life-threatening deterioration of the following conditions:  Cardiac failure   Critical care was time spent personally by me on the following activities:  Discussions with consultants, evaluation of patient's response to treatment, examination of patient, ordering and performing treatments and interventions, ordering and review of laboratory studies, ordering and review of radiographic studies, pulse oximetry, re-evaluation of patient's condition, obtaining history from patient or surrogate and review of old charts   (including critical care time)  Medications Ordered in ED Medications  nitroGLYCERIN (NITROSTAT) SL tablet 0.4 mg (0.4 mg Sublingual Given 11/24/20 1440)  heparin ADULT infusion 100 units/mL (25000 units/258mL) (900 Units/hr Intravenous New Bag/Given 11/24/20 1617)  carvedilol (COREG) tablet 6.25 mg (has no administration in time range)  rosuvastatin (CRESTOR) tablet 40 mg (has no administration in time range)  alum & mag hydroxide-simeth (MAALOX/MYLANTA) 200-200-20 MG/5ML suspension 30 mL (30 mLs Oral Given 11/24/20 1256)    And  lidocaine (XYLOCAINE) 2 % viscous mouth solution 15 mL (15 mLs Oral Given 11/24/20 1256)  heparin bolus via infusion 4,000 Units (4,000 Units Intravenous Bolus from Bag 11/24/20 1618)    ED Course  I have reviewed the triage vital signs and the nursing notes.  Pertinent labs & imaging results that were available during my care of the patient were reviewed by me and considered in my medical decision making (see chart for details).    MDM Rules/Calculators/A&P                         60 year old female with history of STEMI in 2016 presents with sudden onset left-sided chest pain and chest this morning.  Patient administered aspirin and sublingual nitro by EMS.  Patient hypertensive on intake to 149/84.  Vital signs otherwise normal.  Physical exam is very reassuring. Cardiopulmonary exam without focal abnormality.  Abdominal exam is  benign.  Patient is neurologically intact and neurovascularly intact in all 4 extremities.  Basic laboratory studies obtained in triage.  CBC unremarkable, BMP with mild elevation in creatinine to 1.09, from 1.02 which is the patient's baseline.  Chest x-ray negative for acute cardiopulmonary disease, EKG with NSR, no STEMI.  Initial troponin negative, 15.  Patient reevaluated, still having significant chest pressure.  Will proceed with delta troponin at this time, as well as GI cocktail for possible GERD.   Delta troponin is positive, 40. Concerning for NSTEMI.  Sublingual nitro administered, heparin ordered, cardiology consulted -cardiology coordinator, Wannetta Sender, took report of this patient and informed this provider that cardiology service will be evaluating her in the emergency department.  Patient reevaluated and continues to have chest pressure, will administer third dose of sublingual nitro.  Cardiology APP, Roby Lofts PA-C, evaluated the patient in the ED; plan for [facility] and echo tomorrow, cardiology to admit. I appreciate her collaboration in the care of this patient.  This chart was dictated using voice recognition software, Dragon. Despite the best efforts of this provider to proofread and correct errors, errors may still occur which can change documentation meaning.  Final Clinical Impression(s) / ED Diagnoses Final diagnoses:  None    Rx / DC Orders ED Discharge Orders    None       Emeline Darling, PA-C 11/24/20 1645    Jevon Littlepage, Gypsy Balsam, PA-C 11/24/20 1809    Lorelle Gibbs, DO 11/25/20 [id number]

## 2020-11-24 NOTE — Progress Notes (Signed)
ANTICOAGULATION CONSULT NOTE - Initial Consult  Pharmacy Consult for IV heparin Indication: NSTEMI  No Known Allergies  Patient Measurements: Height: 5\' 6"  (167.6 cm) Weight: 76.7 kg (169 lb) IBW/kg (Calculated) : 59.3 Heparin Dosing Weight: 74.9 kg  Vital Signs: Temp: 98.6 F (37 C) (01/18 1115) Temp Source: Oral (01/18 1115) BP: 165/77 (01/18 1315) Pulse Rate: 75 (01/18 1315)  Labs: Recent Labs    11/24/20 1037 11/24/20 1300  HGB 12.6  --   HCT 40.1  --   PLT 160  --   CREATININE 1.09*  --   TROPONINIHS 15 40*    Estimated Creatinine Clearance: 57.4 mL/min (A) (by C-G formula based on SCr of 1.09 mg/dL (H)).   Medical History: Past Medical History:  Diagnosis Date  . Anemia   . CAD (coronary artery disease)    a. 06/2015 BMS x2 to LCx. b. recurrent CP, cath 09/18/2015 patent stent, tiny diag stenosis medical therapy.   . CHF (congestive heart failure) ([place])   . Diabetes mellitus without complication ([place])   . GERD (gastroesophageal reflux disease)    ?  Marland Kitchen Hyperlipidemia with target LDL less than 70   . Hypertension   . Shortness of breath dyspnea    with exertion  . STEMI (ST elevation myocardial infarction) ([place]) 06/16/2015   BMS x 2 CFX  . Thyroid nodule    benign    Medications:  (Not in a hospital admission)   Assessment: 60 year old female with PMH significant for CAD, hx of STEMI, CHF, diabetes, HLD, and HTN presented to ED for chest pain. Pharmacy consulted to start heparin infusion for NSTEMI.  No anticoagulation prior to admission per chart review. CBC wnl.   Goal of Therapy:  Heparin level 0.3-0.7 units/ml Monitor platelets by anticoagulation protocol: Yes   Plan:  Give 4000 units bolus x 1 Start heparin infusion at 900 units/hr Heparin level in 6 hours Daily heparin level, CBC Monitor s/sx bleeding  Fara Olden, PharmD PGY-1 Pharmacy Resident 11/24/2020 2:38 PM Please see AMION for all pharmacy numbers

## 2020-11-24 NOTE — H&P (Addendum)
Cardiology Admission History and Physical:   Patient ID: Virginia [name] MRN: [id number]; DOB: [birth year]   Admission date: 11/24/2020  Primary Care Provider: Tresa Garter, MD [facility] Cardiologist: Larae Grooms, MD  [facility] Electrophysiologist:  None   Chief Complaint:  Chest pain  Patient Profile:   Virginia [name] is a 60 y.o. female with a PMH of of CAD s/p overlapping BMS to LCx in 2016, HTN, HLD, DM type 2, and GERD, who presented with chest pain.   History of Present Illness:   Ms. Vanroy was in her usual state of health until this morning when she was awoken from sleep with chest pressure. Pain lasted a couple minutes and resolved spontaneously. She has been staying with her daughter who recently had twins. She reports she got up, fed the twins, got them back to sleep, then went back to sleep herself. Shortly thereafter she was again awoken from sleep around 6am with recurrent chest pressure. Symptoms again subsided in a couple minutes. She denies any other associated symptoms with these episodes and felt they were likely related to reflux. She reports she was doing small things around the house a little before 8am when she felt a burning sensation in the center of her chest which radiated outwards with associated SOB. This was reminiscent of her prior MI which prompted her to activate EMS.    She was last evaluated by cardiology at an outpatient visit with Dr. Irish Lack in 2019, at which time she had some atypical chest discomfort, though stress test the moth prior was low risk and decision made to medically manage her symptoms. She was recommended to follow-up in 6 months, however has not been seen since that time.   At the time of this evaluation she reports ongoing chest pressure, however the burning sensation has subsided. She self administered 4 baby aspirin prior to arrival and received SL nitro as well which reduced her pain but did not completely resolve  it. Over the past 6 weeks or so she thinks she has had more DOE, orthopnea, and LE edema. She denies palpitations, PND, or syncope. She reports occasional lightheadedness with position changes and reports husband has noticed unsteadiness on her feet when she walks after sitting for a while. No complaints of falls. She reports compliance with her medications with the exception of her atorvastatin which she reports caused her to have back pain. She notes her blood pressure has been poorly controlled recently. ROS notable for some lower abdominal discomfort and back pain, though denies diarrhea, dysuria, hematuria, melena, hematochezia, or fever. She denies recent URI illnesses.   ED course: hypertensive, otherwise VSS. Labs notable for K 3.7, Cr 1.09, CBC wnl, HsTrop 15>40. CXR without acute findings. EKG with sinus rhythm, rate 80 bpm, non-specific T wave abnormalities, no STE/D. She was given a GI cocktail and 1 SL nitro in the ED. Started on a heparin gtt given rise in troponin. Cardiology asked to evaluate patient.   Past Medical History:  Diagnosis Date  . Anemia   . CAD (coronary artery disease)    a. 06/2015 BMS x2 to LCx. b. recurrent CP, cath 09/18/2015 patent stent, tiny diag stenosis medical therapy.   . CHF (congestive heart failure) ([place])   . Diabetes mellitus without complication ([place])   . GERD (gastroesophageal reflux disease)    ?  Marland Kitchen Hyperlipidemia with target LDL less than 70   . Hypertension   . Shortness of breath dyspnea    with  exertion  . STEMI (ST elevation myocardial infarction) ([place]) 06/16/2015   BMS x 2 CFX  . Thyroid nodule    benign    Past Surgical History:  Procedure Laterality Date  . ABDOMINAL HYSTERECTOMY N/A 02/02/2016   Procedure: HYSTERECTOMY ABDOMINAL WITH BILATERAL SALPINGECTOMY;  Surgeon: Woodroe Mode, MD;  Location: [facility];  Service: Gynecology;  Laterality: N/A;  . APPENDECTOMY    . CARDIAC CATHETERIZATION N/A 06/17/2015   Procedure: Left Heart Cath  and Coronary Angiography;  Surgeon: Jettie Booze, MD;  Location: [facility];  Service: Cardiovascular;  Laterality: N/A;  . CARDIAC CATHETERIZATION  06/17/2015   Procedure: Coronary Stent Intervention;  Surgeon: Jettie Booze, MD;  Location: [place] [facility];  Service: Cardiovascular;;  . CARDIAC CATHETERIZATION N/A 09/18/2015   Procedure: Left Heart Cath and Coronary Angiography;  Surgeon: Troy Sine, MD;  Location: [place] [facility];  Service: Cardiovascular;  Laterality: N/A;  . CESAREAN SECTION     Myomectomy done during cesarean section  . COLONOSCOPY N/A 09/20/2015   Procedure: COLONOSCOPY;  Surgeon: Carol Ada, MD;  Location: [facility] ENDOSCOPY;  Service: Endoscopy;  Laterality: N/A;  . COLONOSCOPY    . ESOPHAGOGASTRODUODENOSCOPY N/A 09/20/2015   Procedure: ESOPHAGOGASTRODUODENOSCOPY (EGD);  Surgeon: Carol Ada, MD;  Location: [facility] -  ENDOSCOPY;  Service: Endoscopy;  Laterality: N/A;  . olympus  quick clip pro gastric body  10/09/15     Medications Prior to Admission: Prior to Admission medications   Medication Sig Start Date End Date Taking? Authorizing Provider  acetaminophen (TYLENOL) 325 MG tablet Take 2 tablets (650 mg total) by mouth every 4 (four) hours as needed for mild pain. 09/27/19   Azzie Glatter, FNP  aspirin EC 81 MG tablet Take 1 tablet (81 mg total) by mouth daily. 09/27/19   Azzie Glatter, FNP  ferrous sulfate (FEROSUL) 325 (65 FE) MG tablet Take 1 tablet (325 mg total) by mouth daily with breakfast. 09/27/19   Azzie Glatter, FNP  gabapentin (NEURONTIN) 300 MG capsule Take 2 capsules (600 mg), by mouth, 2 times a day. 04/03/20   Azzie Glatter, FNP  ibuprofen (ADVIL) 600 MG tablet Take 1 tablet (600 mg total) by mouth 3 (three) times daily. 10/01/19   Varney Biles, MD  isosorbide mononitrate (IMDUR) 30 MG 24 hr tablet Take 0.5 tablets (15 mg total) by mouth daily. 09/27/19   Azzie Glatter, FNP  lisinopril (ZESTRIL) 2.5 MG tablet  Take 1 tablet (2.5 mg total) by mouth daily. 10/13/20   Azzie Glatter, FNP  metFORMIN (GLUCOPHAGE) 500 MG tablet Take 1 tablet (500 mg total) by mouth daily with breakfast. 09/27/19   Azzie Glatter, FNP  metoprolol tartrate (LOPRESSOR) 25 MG tablet Take 0.5 tablets (12.5 mg total) by mouth 2 (two) times daily. 10/13/20   Azzie Glatter, FNP  nitroGLYCERIN (NITROSTAT) 0.4 MG SL tablet Place 1 tablet (0.4 mg total) under the tongue every 5 (five) minutes as needed for chest pain. Patient not taking: Reported on 04/03/2020 03/26/19   Azzie Glatter, FNP  omega-3 fish oil (MAXEPA) 1000 MG CAPS capsule Take 1 capsule (1,000 mg total) by mouth daily. 10/06/20   Azzie Glatter, FNP  omeprazole (PRILOSEC) 20 MG capsule Take 1 capsule (20 mg total) by mouth daily. 09/27/19   Azzie Glatter, FNP  vitamin B-12 (CYANOCOBALAMIN) 1000 MCG tablet Take 1 tablet (1,000 mcg total) by mouth daily. 09/27/19   Azzie Glatter, FNP  Allergies:   No Known Allergies  Social History:   Social History   Socioeconomic History  . Marital status: Married    Spouse name: Not on file  . Number of children: Not on file  . Years of education: Not on file  . Highest education level: Not on file  Occupational History  . Not on file  Tobacco Use  . Smoking status: Never Smoker  . Smokeless tobacco: Never Used  Vaping Use  . Vaping Use: Never used  Substance and Sexual Activity  . Alcohol use: No    Alcohol/week: 0.0 standard drinks  . Drug use: No  . Sexual activity: Not Currently    Birth control/protection: Post-menopausal  Other Topics Concern  . Not on file  Social History Narrative  . Not on file   Social Determinants of Health   Financial Resource Strain: Not on file  Food Insecurity: Not on file  Transportation Needs: Not on file  Physical Activity: Not on file  Stress: Not on file  Social Connections: Not on file  Intimate Partner Violence: Not on file    Family History:    The patient's family history includes Asthma in her paternal uncle; Cancer in her father; Diabetes in her mother, paternal aunt, and paternal uncle; Heart attack in her brother and maternal uncle; Heart disease in her mother; Hyperlipidemia in her sister; Hypertension in her brother and sister; Stroke in her mother. There is no history of Thyroid disease.    ROS:  Please see the history of present illness.  All other ROS reviewed and negative.     Physical Exam/Data:   Vitals:   11/24/20 1315 11/24/20 1430 11/24/20 1435 11/24/20 1445  BP: (!) 165/77 (!) 169/87  (!) 158/99  Pulse: 75 71 75 67  Resp: 17 11 14 13   Temp:      TempSrc:      SpO2: 99% 100% 99% 98%  Weight:      Height:       No intake or output data in the 24 hours ending 11/24/20 1633 Last 3 Weights 11/24/2020 11/24/2020 10/06/2020  Weight (lbs) 169 lb 169 lb 166 lb 12.8 oz  Weight (kg) 76.658 kg 76.658 kg 75.66 kg     Body mass index is 27.28 kg/m.  General:  Well nourished, well developed, in no acute distress HEENT: sclera anicteric Neck: no JVD Endocrine:  No thryomegaly Vascular: No carotid bruits; distal pulses 2+ bilaterally  Cardiac:  normal S1, S2; RRR; no murmurs, rubs, or gallops Lungs:  clear to auscultation bilaterally, no wheezing, rhonchi or rales  Abd: soft, nontender, no hepatomegaly  Ext: no edema Musculoskeletal:  No deformities, BUE and BLE strength normal and equal Skin: warm and dry  Neuro:  CNs 2-12 intact, no focal abnormalities noted Psych:  Normal affect    EKG:  The ECG that was done 11/24/20 was personally reviewed and demonstrates sinus rhythm with rate 80 bpm, no STE/D, non-specific T wave abnormalities.   Relevant CV Studies: Echocardiogram 2016: - Left ventricle: The cavity size was normal. Wall thickness was  normal. Systolic function was normal. The estimated ejection  fraction was in the range of 60% to 65%. Wall motion was normal;  there were no regional wall motion  abnormalities.  - Mitral valve: There was mild regurgitation.   [place] 2016:  Prox LAD lesion, 60% stenosed.  2nd Diag lesion, 95% stenosed.  The left ventricular systolic function is normal.  Mid LAD to Dist LAD lesion,  20% stenosed.   Normal LV function without residual wall motion abnormality with an ejection fraction of 55-65%.  Two-vessel CAD with 60% eccentric smooth tubular ostial LAD stenosis, 20% mid stenosis in the region of a very small bifurcating diagonal vessel with 95% stenosis in the inferior limb of the small diagonal branch; widely patent tandem bare-metal stents in the AV groove circumflex vessel; normal RCA.  RECOMMENDATION: Increased medical therapy.  Nitrates will be added to her regimen of beta blocker, ACE inhibitor and statin.  Consider amlodipine if recurrent symptomatology.  Diagnostic Dominance: Right     Laboratory Data:  High Sensitivity Troponin:   Recent Labs  Lab 11/24/20 1037 11/24/20 1300  TROPONINIHS 15 40*      Chemistry Recent Labs  Lab 11/24/20 1037  NA 141  K 3.7  CL 106  CO2 27  GLUCOSE 88  BUN 9  CREATININE 1.09*  CALCIUM 8.8*  GFRNONAA 58*  ANIONGAP 8    No results for input(s): PROT, ALBUMIN, AST, ALT, ALKPHOS, BILITOT in the last 168 hours. Hematology Recent Labs  Lab 11/24/20 1037  WBC 4.8  RBC 4.24  HGB 12.6  HCT 40.1  MCV 94.6  MCH 29.7  MCHC 31.4  RDW 13.6  PLT 160   BNPNo results for input(s): BNP, PROBNP in the last 168 hours.  DDimer No results for input(s): DDIMER in the last 168 hours.   Radiology/Studies:  DG Chest 2 View  Result Date: 11/24/2020 CLINICAL DATA:  Chest pain, pain radiating down RIGHT arm along with nausea that started overnight EXAM: CHEST - 2 VIEW COMPARISON:  October 01, 2019 FINDINGS: Trachea midline. Cardiomediastinal contours and hilar structures are normal. Lungs are clear. No sign of pleural effusion. On limited assessment no acute skeletal process. IMPRESSION: No  acute cardiopulmonary disease. Electronically Signed   By: Zetta Bills M.D.   On: 11/24/2020 11:03     Assessment and Plan:   1. Chest pain in patient with known CAD: patient has known history of CAD with 60% pLAD stenosis, 95% 2nd diagonal stenosis, 20% m-dLAD stenosis, and patent tandem BMS to LCx on LHC in 2016 which was medically managed. She has been lost to follow-up since 12/2017. Now presenting with chest pain. EKG has non-specific T wave abnormalities but no STE/D. HsTrop 15>40. Possible she has had progression of her heart disease - BP is poorly controlled and most recent LDL 03/2020 was 188. A1C has been well controlled at 4.6. She has not been taking her atorvastatin due to back pain. - Will update an echocardiogram to evaluate LV function and wall motion - Continue to trend troponin to peak - Continue heparin gtt - Plan for [facility] tomorrow to further evaluate symptoms - Continue aspirin  - Continue imdur - Will start crestor 40mg  daily given prior intolerance to atorvastatin  2. HTN: Poorly controlled.  - Will stop metoprolol and start carvedilol 6.25mg  BID  - Will hold home lisinopril in anticipation of LHC tomorrow - favor starting ARB afterwards (valsartan/irbesartan) for improved BP control if Cr stable  - Further titration pending response to above  3. HLD: Last LDL 188 03/2020 - Start crestor 40mg  daily  4. DM type 2: A1C 4.6 09/2020 - Continue ISS while admitted  5. GERD: symptoms somewhat improved with GI cocktail - Continue pantoprazole while admitted      TIMI Risk Score for Unstable Angina or Non-ST Elevation MI:   The patient's TIMI risk score is 5, which indicates a 26% risk of  all cause mortality, new or recurrent myocardial infarction or need for urgent revascularization in the next 14 days.  New York Heart Association (NYHA) Functional Class NYHA Class II   Severity of Illness: The appropriate patient status for this patient is OBSERVATION. Observation  status is judged to be reasonable and necessary in order to provide the required intensity of service to ensure the patient's safety. The patient's presenting symptoms, physical exam findings, and initial radiographic and laboratory data in the context of their medical condition is felt to place them at decreased risk for further clinical deterioration. Furthermore, it is anticipated that the patient will be medically stable for discharge from the hospital within 2 midnights of admission. The following factors support the patient status of observation.   " The patient's presenting symptoms include chest pain. " The physical exam findings include benign cardiopulmonary exam. " The initial radiographic and laboratory data are mildly elevated HsTrop.     For questions or updates, please contact [place] Please consult [email or web address] for contact info under     Signed, Abigail Butts, PA-C  11/24/2020 4:33 PM   Patient seen and examined and agree with Roby Lofts, PA-C.   In brief, the patient is a 60 year old female with history of known CAD s/p overlapping BMS to LCx in 2016, HLD, DMII, and GERD who presented to the ER with episodes of substernal chest pressure similar to her anginal equivalent in the past for which Cardiology has been consulted.  Patient states that she has noticed worsening shortness of breath and episodes of chest pressure over the past several weeks with exertion. Today, she awoke from sleep with substernal chest pressure that went away but then recurred a few hours later. Again, it subsided but came on more strongly shortly thereafter prompting her to go to the ED.  In the ED trop 15--.40. ECG without acute ischemic changes. Given history of known CAD and chest discomfort similar to prior angina, she merits ischemic work-up at this time. Will plan for cath tomorrow AM.  Exam: GEN: No acute distress.   Neck: No JVD Cardiac: RRR, no murmurs, rubs, or gallops.   Respiratory: Clear to auscultation bilaterally. GI: Soft, nontender, non-distended  MS: No edema; No deformity. Neuro:  Nonfocal  Psych: Normal affect   Plan: -Plan for cath tomorrow morning -Keep NPO after MN -Trend troponin -Check TTE -Continue heparin gtt -Continue ASA 81mg  daily, crestor 40mg  daily  -Change metop to coreg 6.25mg  BID for better blood pressure control -Plan for ARB post-cath -Needs aggressive blood pressure and cholesterol treatment going forward  INFORMED CONSENT: I have reviewed the risks, indications, and alternatives to cardiac catheterization, possible angioplasty, and stenting with the patient. Risks include but are not limited to bleeding, infection, vascular injury, stroke, myocardial infection, arrhythmia, kidney injury, radiation-related injury in the case of prolonged fluoroscopy use, emergency cardiac surgery, and death. The patient understands the risks of serious complication is 1-2 in [id number] with diagnostic cardiac cath and 1-2% or less with angioplasty/stenting.    Gwyndolyn Kaufman, MD

## 2020-11-25 ENCOUNTER — Encounter (HOSPITAL_COMMUNITY): Payer: Self-pay | Admitting: Cardiology

## 2020-11-25 ENCOUNTER — Observation Stay (HOSPITAL_COMMUNITY): Payer: Self-pay

## 2020-11-25 ENCOUNTER — Inpatient Hospital Stay (HOSPITAL_COMMUNITY): Payer: Self-pay

## 2020-11-25 ENCOUNTER — Inpatient Hospital Stay (HOSPITAL_COMMUNITY)
Admission: EM | Disposition: A | Discharge status: Home / Self Care | Payer: Self-pay | Source: Home / Self Care | Attending: Cardiology

## 2020-11-25 ENCOUNTER — Other Ambulatory Visit: Payer: Self-pay

## 2020-11-25 DIAGNOSIS — I214 Non-ST elevation (NSTEMI) myocardial infarction: Secondary | ICD-10-CM | POA: Diagnosis present

## 2020-11-25 DIAGNOSIS — I25110 Atherosclerotic heart disease of native coronary artery with unstable angina pectoris: Secondary | ICD-10-CM

## 2020-11-25 DIAGNOSIS — R079 Chest pain, unspecified: Secondary | ICD-10-CM

## 2020-11-25 DIAGNOSIS — I361 Nonrheumatic tricuspid (valve) insufficiency: Secondary | ICD-10-CM

## 2020-11-25 HISTORY — PX: LEFT HEART CATH AND CORONARY ANGIOGRAPHY: CATH118249

## 2020-11-25 HISTORY — PX: CORONARY STENT PLACEMENT: SHX1402

## 2020-11-25 HISTORY — PX: CORONARY STENT INTERVENTION: CATH118234

## 2020-11-25 LAB — ECHOCARDIOGRAM COMPLETE
Area-P 1/2: 6.71 cm2
Calc EF: 62.5 %
Height: 66 in
S' Lateral: 2.7 cm
Single Plane A2C EF: 61.8 %
Single Plane A4C EF: 66.7 %
Weight: 2627.2 oz

## 2020-11-25 LAB — CBC
HCT: 40.8 % (ref 36.0–46.0)
Hemoglobin: 13.7 g/dL (ref 12.0–15.0)
MCH: 30.6 pg (ref 26.0–34.0)
MCHC: 33.6 g/dL (ref 30.0–36.0)
MCV: 91.3 fL (ref 80.0–100.0)
Platelets: 166 10*3/uL (ref 150–400)
RBC: 4.47 MIL/uL (ref 3.87–5.11)
RDW: 13.8 % (ref 11.5–15.5)
WBC: 4.6 10*3/uL (ref 4.0–10.5)
nRBC: 0 % (ref 0.0–0.2)

## 2020-11-25 LAB — BASIC METABOLIC PANEL
Anion gap: 9 (ref 5–15)
BUN: 8 mg/dL (ref 6–20)
CO2: 24 mmol/L (ref 22–32)
Calcium: 8.9 mg/dL (ref 8.9–10.3)
Chloride: 106 mmol/L (ref 98–111)
Creatinine, Ser: 0.92 mg/dL (ref 0.44–1.00)
GFR, Estimated: >60 mL/min (ref >60)
Glucose, Bld: 114 mg/dL — ABNORMAL HIGH (ref 70–99)
Potassium: 3.9 mmol/L (ref 3.5–5.1)
Sodium: 139 mmol/L (ref 135–145)

## 2020-11-25 LAB — LIPID PANEL
Cholesterol: 250 mg/dL — ABNORMAL HIGH (ref 0–200)
HDL: 62 mg/dL (ref >40)
LDL Cholesterol: 167 mg/dL — ABNORMAL HIGH (ref 0–99)
Total CHOL/HDL Ratio: 4 RATIO
Triglycerides: 103 mg/dL (ref <150)
VLDL: 21 mg/dL (ref 0–40)

## 2020-11-25 LAB — GLUCOSE, CAPILLARY
Glucose-Capillary: 100 mg/dL — ABNORMAL HIGH (ref 70–99)
Glucose-Capillary: 65 mg/dL — ABNORMAL LOW (ref 70–99)
Glucose-Capillary: 81 mg/dL (ref 70–99)
Glucose-Capillary: 101 mg/dL — ABNORMAL HIGH (ref 70–99)
Glucose-Capillary: 134 mg/dL — ABNORMAL HIGH (ref 70–99)

## 2020-11-25 LAB — HEMOGLOBIN A1C
Hgb A1c MFr Bld: 5.9 % — ABNORMAL HIGH (ref 4.8–5.6)
Mean Plasma Glucose: 122.63 mg/dL

## 2020-11-25 LAB — HEMOGLOBIN AND HEMATOCRIT, BLOOD
HCT: 39.2 % (ref 36.0–46.0)
Hemoglobin: 13 g/dL (ref 12.0–15.0)

## 2020-11-25 LAB — TROPONIN I (HIGH SENSITIVITY): Troponin I (High Sensitivity): 8 ng/L (ref <18)

## 2020-11-25 LAB — TYPE AND SCREEN
ABO/RH(D): O POS
Antibody Screen: NEGATIVE

## 2020-11-25 LAB — HIV ANTIBODY (ROUTINE TESTING W REFLEX): HIV Screen 4th Generation wRfx: NONREACTIVE

## 2020-11-25 LAB — POCT ACTIVATED CLOTTING TIME: Activated Clotting Time: 404 seconds

## 2020-11-25 LAB — HEPARIN LEVEL (UNFRACTIONATED): Heparin Unfractionated: 0.35 IU/mL (ref 0.30–0.70)

## 2020-11-25 SURGERY — LEFT HEART CATH AND CORONARY ANGIOGRAPHY
Anesthesia: LOCAL

## 2020-11-25 MED ORDER — LIDOCAINE HCL (PF) 1 % IJ SOLN
INTRAMUSCULAR | Status: DC | PRN
  Administered 2020-11-25: 10 mL

## 2020-11-25 MED ORDER — TICAGRELOR 90 MG PO TABS
ORAL_TABLET | ORAL | Status: DC | PRN
  Administered 2020-11-25: 180 mg via ORAL

## 2020-11-25 MED ORDER — BIVALIRUDIN TRIFLUOROACETATE 250 MG IV SOLR
INTRAVENOUS | Status: AC
  Filled 2020-11-25: qty 250

## 2020-11-25 MED ORDER — LIDOCAINE HCL (PF) 1 % IJ SOLN
INTRAMUSCULAR | Status: AC
  Filled 2020-11-25: qty 30

## 2020-11-25 MED ORDER — TICAGRELOR 90 MG PO TABS
90.0000 mg | ORAL_TABLET | Freq: Two times a day (BID) | ORAL | Status: DC
  Administered 2020-11-25 – 2020-11-26 (×2): 90 mg via ORAL
  Filled 2020-11-25 (×2): qty 1

## 2020-11-25 MED ORDER — NITROGLYCERIN 1 MG/10 ML FOR IR/CATH LAB
INTRA_ARTERIAL | Status: DC | PRN
  Administered 2020-11-25 (×2): 200 ug via INTRACORONARY

## 2020-11-25 MED ORDER — FENTANYL CITRATE (PF) 100 MCG/2ML IJ SOLN
INTRAMUSCULAR | Status: AC
  Filled 2020-11-25: qty 2

## 2020-11-25 MED ORDER — TICAGRELOR 90 MG PO TABS
ORAL_TABLET | ORAL | Status: AC
  Filled 2020-11-25: qty 1

## 2020-11-25 MED ORDER — SODIUM CHLORIDE 0.9 % IV SOLN: 250.0000 mL | INTRAVENOUS | Status: DC | PRN

## 2020-11-25 MED ORDER — NITROGLYCERIN 1 MG/10 ML FOR IR/CATH LAB
INTRA_ARTERIAL | Status: AC
  Filled 2020-11-25: qty 10

## 2020-11-25 MED ORDER — MIDAZOLAM HCL 2 MG/2ML IJ SOLN
INTRAMUSCULAR | Status: DC | PRN
  Administered 2020-11-25: 2 mg via INTRAVENOUS

## 2020-11-25 MED ORDER — BIVALIRUDIN BOLUS VIA INFUSION - CUPID
INTRAVENOUS | Status: DC | PRN
  Administered 2020-11-25: 55.875 mg via INTRAVENOUS

## 2020-11-25 MED ORDER — HEPARIN (PORCINE) IN NACL 1000-0.9 UT/500ML-% IV SOLN
INTRAVENOUS | Status: AC
  Filled 2020-11-25: qty 1000

## 2020-11-25 MED ORDER — FENTANYL CITRATE (PF) 100 MCG/2ML IJ SOLN
INTRAMUSCULAR | Status: DC | PRN
  Administered 2020-11-25 (×2): 25 ug via INTRAVENOUS

## 2020-11-25 MED ORDER — SODIUM CHLORIDE 0.9 % IV SOLN
INTRAVENOUS | Status: DC | PRN
  Administered 2020-11-25: 1.75 mg/kg/h via INTRAVENOUS

## 2020-11-25 MED ORDER — LABETALOL HCL 5 MG/ML IV SOLN: 10.0000 mg | INTRAVENOUS | Status: AC | PRN

## 2020-11-25 MED ORDER — MIDAZOLAM HCL 2 MG/2ML IJ SOLN
INTRAMUSCULAR | Status: AC
  Filled 2020-11-25: qty 2

## 2020-11-25 MED ORDER — SODIUM CHLORIDE 0.9 % WEIGHT BASED INFUSION: 1.0000 mL/kg/h | INTRAVENOUS | Status: AC

## 2020-11-25 MED ORDER — IOHEXOL 350 MG/ML SOLN
INTRAVENOUS | Status: DC | PRN
  Administered 2020-11-25: 40 mL

## 2020-11-25 MED ORDER — SODIUM CHLORIDE 0.9% FLUSH: 3.0000 mL | INTRAVENOUS | Status: DC | PRN

## 2020-11-25 MED ORDER — SODIUM CHLORIDE 0.9% FLUSH
3.0000 mL | Freq: Two times a day (BID) | INTRAVENOUS | Status: DC
  Administered 2020-11-25 – 2020-11-26 (×2): 3 mL via INTRAVENOUS

## 2020-11-25 MED ORDER — HYDRALAZINE HCL 20 MG/ML IJ SOLN: 10.0000 mg | INTRAMUSCULAR | Status: AC | PRN

## 2020-11-25 MED ORDER — VERAPAMIL HCL 2.5 MG/ML IV SOLN
INTRAVENOUS | Status: AC
  Filled 2020-11-25: qty 2

## 2020-11-25 MED ORDER — HEPARIN (PORCINE) IN NACL 1000-0.9 UT/500ML-% IV SOLN
INTRAVENOUS | Status: DC | PRN
  Administered 2020-11-25 (×2): 500 mL

## 2020-11-25 MED ORDER — INFLUENZA VAC SPLIT QUAD 0.5 ML IM SUSY
0.5000 mL | PREFILLED_SYRINGE | INTRAMUSCULAR | Status: AC
  Administered 2020-11-26: 0.5 mL via INTRAMUSCULAR
  Filled 2020-11-25: qty 0.5

## 2020-11-25 SURGICAL SUPPLY — 20 items
BAG SNAP BAND KOVER 36X36 (MISCELLANEOUS) ×1 IMPLANT
BALLN SAPPHIRE 2.5X12 (BALLOONS) ×2
BALLN SAPPHIRE ~~LOC~~ 3.0X12 (BALLOONS) ×1 IMPLANT
BALLOON SAPPHIRE 2.5X12 (BALLOONS) IMPLANT
CATH INFINITI 5FR MULTPACK ANG (CATHETERS) ×1 IMPLANT
CATH VISTA GUIDE 6FR XBLAD3.5 (CATHETERS) ×1 IMPLANT
CLOSURE PERCLOSE PROSTYLE (VASCULAR PRODUCTS) ×1 IMPLANT
COVER DOME SNAP 22 D (MISCELLANEOUS) ×1 IMPLANT
KIT ENCORE 26 ADVANTAGE (KITS) ×1 IMPLANT
KIT HEART LEFT (KITS) ×2 IMPLANT
KIT MICROPUNCTURE NIT STIFF (SHEATH) ×1 IMPLANT
PACK CARDIAC CATHETERIZATION (CUSTOM PROCEDURE TRAY) ×2 IMPLANT
SHEATH PINNACLE 5F 10CM (SHEATH) ×1 IMPLANT
SHEATH PINNACLE 6F 10CM (SHEATH) ×1 IMPLANT
SHEATH PROBE COVER 6X72 (BAG) ×1 IMPLANT
STENT RESOLUTE ONYX 2.75X15 (Permanent Stent) ×1 IMPLANT
TRANSDUCER W/STOPCOCK (MISCELLANEOUS) ×2 IMPLANT
TUBING CIL FLEX 10 FLL-RA (TUBING) ×2 IMPLANT
WIRE COUGAR XT STRL 190CM (WIRE) ×1 IMPLANT
WIRE EMERALD 3MM-J .035X150CM (WIRE) ×1 IMPLANT

## 2020-11-25 NOTE — Progress Notes (Signed)
   Notified by RN that patient experiencing severe abdominal pain and nausea. Dr. Johney Frame notified and evaluated the patient at bedside. Stat H/H reviewed and stable. Await stat CT A/P to r/o retroperitoneal bleed. Called CT department and patient is 3rd in line. Will pass along to overnight coverage to follow-up.   Abigail Butts, PA-C 11/25/20; 7:29 PM

## 2020-11-25 NOTE — Progress Notes (Signed)
  Echocardiogram 2D Echocardiogram has been performed.  Virginia [name] 11/25/2020, 3:03 PM

## 2020-11-25 NOTE — Interval H&P Note (Signed)
Cath Lab Visit (complete for each Cath Lab visit)  Clinical Evaluation Leading to the Procedure:   ACS: Yes.    Non-ACS:    Anginal Classification: CCS IV  Anti-ischemic medical therapy: Maximal Therapy (2 or more classes of medications)  Non-Invasive Test Results: No non-invasive testing performed  Prior CABG: No previous CABG      History and Physical Interval Note:  11/25/2020 9:29 AM  Virginia [name]  has presented today for surgery, with the diagnosis of unstable angina.  The various methods of treatment have been discussed with the patient and family. After consideration of risks, benefits and other options for treatment, the patient has consented to  Procedure(s): LEFT HEART CATH AND CORONARY ANGIOGRAPHY (N/A) as a surgical intervention.  The patient's history has been reviewed, patient examined, no change in status, stable for surgery.  I have reviewed the patient's chart and labs.  Questions were answered to the patient's satisfaction.     Sherren Mocha

## 2020-11-25 NOTE — H&P (View-Only) (Signed)
Progress Note  Patient Name: Virginia [name] Date of Encounter: 11/25/2020  [facility] Cardiologist: Larae Grooms, MD   Subjective   Doing well this morning. Plan for LHC today. Trop down-trended BP remain elevated at 140s this AM Cr stable 0.92 LDL 167, HDL 62, TG 103  Inpatient Medications    Scheduled Meds: . aspirin EC  81 mg Oral Daily  . carvedilol  6.25 mg Oral BID WC  . insulin aspart  0-5 Units Subcutaneous QHS  . insulin aspart  0-9 Units Subcutaneous TID WC  . isosorbide mononitrate  15 mg Oral Daily  . pantoprazole  40 mg Oral Daily  . rosuvastatin  40 mg Oral QHS  . sodium chloride flush  3 mL Intravenous Q12H   Continuous Infusions: . sodium chloride    . sodium chloride 1 mL/kg/hr (11/25/20 0545)  . heparin 900 Units/hr (11/24/20 1617)   PRN Meds: sodium chloride, acetaminophen, nitroGLYCERIN, ondansetron (ZOFRAN) IV, sodium chloride flush   Vital Signs    Vitals:   11/24/20 2130 11/25/20 0630 11/25/20 0631 11/25/20 0737  BP: (!) 160/90  138/87 (!) 146/96  Pulse: 78  71 74  Resp: 14  14 18   Temp: 98.5 F (36.9 C)  98.9 F (37.2 C) 98.7 F (37.1 C)  TempSrc: Oral  Oral Oral  SpO2: 98%  99% 99%  Weight:  74.5 kg    Height:       No intake or output data in the 24 hours ending 11/25/20 0834 Last 3 Weights 11/25/2020 11/24/2020 11/24/2020  Weight (lbs) 164 lb 3.2 oz 169 lb 169 lb  Weight (kg) 74.481 kg 76.658 kg 76.658 kg      Telemetry    NSR - Personally Reviewed  ECG    NSR with no ischemic changes - Personally Reviewed  Physical Exam   GEN: No acute distress.   Neck: No JVD Cardiac: RRR, no murmurs, rubs, or gallops.  Respiratory: Clear to auscultation bilaterally. GI: Soft, nontender, non-distended  MS: No edema; No deformity. Neuro:  Nonfocal  Psych: Normal affect   Labs    High Sensitivity Troponin:   Recent Labs  Lab 11/24/20 1300 11/24/20 1600 11/24/20 1948 11/24/20 2119 11/25/20 0119  TROPONINIHS 40*  22* 11 11 8       Chemistry Recent Labs  Lab 11/24/20 1037 11/25/20 0316  NA 141 139  K 3.7 3.9  CL 106 106  CO2 27 24  GLUCOSE 88 114*  BUN 9 8  CREATININE 1.09* 0.92  CALCIUM 8.8* 8.9  GFRNONAA 58* >60  ANIONGAP 8 9     Hematology Recent Labs  Lab 11/24/20 1037 11/25/20 0316  WBC 4.8 4.6  RBC 4.24 4.47  HGB 12.6 13.7  HCT 40.1 40.8  MCV 94.6 91.3  MCH 29.7 30.6  MCHC 31.4 33.6  RDW 13.6 13.8  PLT 160 166    BNPNo results for input(s): BNP, PROBNP in the last 168 hours.   DDimer No results for input(s): DDIMER in the last 168 hours.   Radiology    DG Chest 2 View  Result Date: 11/24/2020 CLINICAL DATA:  Chest pain, pain radiating down RIGHT arm along with nausea that started overnight EXAM: CHEST - 2 VIEW COMPARISON:  October 01, 2019 FINDINGS: Trachea midline. Cardiomediastinal contours and hilar structures are normal. Lungs are clear. No sign of pleural effusion. On limited assessment no acute skeletal process. IMPRESSION: No acute cardiopulmonary disease. Electronically Signed   By: Jewel Baize.D.  On: 11/24/2020 11:03    Cardiac Studies   Echocardiogram 2016: - Left ventricle: The cavity size was normal. Wall thickness was  normal. Systolic function was normal. The estimated ejection  fraction was in the range of 60% to 65%. Wall motion was normal;  there were no regional wall motion abnormalities.  - Mitral valve: There was mild regurgitation.   [place] 2016:  Prox LAD lesion, 60% stenosed.  2nd Diag lesion, 95% stenosed.  The left ventricular systolic function is normal.  Mid LAD to Dist LAD lesion, 20% stenosed.  Normal LV function without residual wall motion abnormality with an ejection fraction of 55-65%.  Two-vessel CAD with 60% eccentric smooth tubular ostial LAD stenosis, 20% mid stenosis in the region of a very small bifurcating diagonal vessel with 95% stenosis in the inferior limb of the small diagonal branch; widely  patent tandem bare-metal stents in the AV groove circumflex vessel; normal RCA.  RECOMMENDATION: Increased medical therapy. Nitrates will be added to her regimen of beta blocker, ACE inhibitor and statin. Consider amlodipine if recurrent symptomatology.  Diagnostic Dominance: Right     Patient Profile     60 y.o. female with history of known CAD s/p overlapping BMS to LCx in 2016, HLD, DMII, and GERD who presented to the ER with episodes of substernal chest pressure similar to her anginal equivalent in the past for which Cardiology has been consulted. Now planned for cath this AM.  Assessment & Plan   #Chest pain  #Known CAD s/p PCI to LCx: Patient has known history of CAD with 60% pLAD stenosis, 95% 2nd diagonal stenosis, 20% m-dLAD stenosis, and patent tandem BMS to LCx on LHC in 2016 which was medically managed. She has been lost to follow-up since 12/2017. Now presenting with chest pain. EKG has non-specific T wave abnormalities but no STE/D. HsTrop 15>40. Possible she has had progression of her heart disease - BP is poorly controlled and most recent LDL 03/2020 was 188. Has not been taking her atorvastatin due to back pain. - NPO for cath today - Continue heparin gtt - Continue crestor 40mg  daily - Continue aspirin  - Continue imdur - Follow-up TTE  #HTN: Poorly controlled.  - Continue carvedilol 6.25mg  BID  - Start ARB post-cath for more aggressive BP control  #HLD: Last LDL 188 03/2020-->167 today - Continue crestor 40mg  daily  #DM type 2: A1C 4.6 09/2020 - Continue ISS while admitted  #GERD:  - Continue pantoprazole while admitted    For questions or updates, please contact [place] Please consult [email or web address] for contact info under        Signed, Freada Bergeron, MD  11/25/2020, 8:34 AM

## 2020-11-25 NOTE — Progress Notes (Signed)
1725 patient complained of severe abdominal pain and nausea. Paged on call PA to report, given tylenol. Notified Dr. Johney Frame that cath site looks good, level 0, dp +2, No chest pain or shortness of breath. Noted small drop in BP. Dr. Johney Frame into see patient and orders given.

## 2020-11-25 NOTE — Progress Notes (Signed)
ANTICOAGULATION CONSULT NOTE  Pharmacy Consult for IV heparin Indication: NSTEMI  No Known Allergies  Patient Measurements: Height: 5\' 6"  (167.6 cm) Weight: 74.5 kg (164 lb 3.2 oz) IBW/kg (Calculated) : 59.3 Heparin Dosing Weight: 74.9 kg  Vital Signs: Temp: 98.9 F (37.2 C) (01/19 0631) Temp Source: Oral (01/19 0631) BP: 138/87 (01/19 0631) Pulse Rate: 71 (01/19 0631)  Labs: Recent Labs    11/24/20 1037 11/24/20 1300 11/24/20 1948 11/24/20 2119 11/25/20 0119 11/25/20 0316  HGB 12.6  --   --   --   --  13.7  HCT 40.1  --   --   --   --  40.8  PLT 160  --   --   --   --  166  HEPARINUNFRC  --   --   --  0.51  --  0.35  CREATININE 1.09*  --   --   --   --  0.92  TROPONINIHS 15   < > 11 11 8   --    < > = values in this interval not displayed.    Estimated Creatinine Clearance: 67.1 mL/min (by C-G formula based on SCr of 0.92 mg/dL).   Medical History: Past Medical History:  Diagnosis Date  . Anemia   . CAD (coronary artery disease)    a. 06/2015 BMS x2 to LCx. b. recurrent CP, cath 09/18/2015 patent stent, tiny diag stenosis medical therapy.   . CHF (congestive heart failure) ([place])   . Diabetes mellitus without complication ([place])   . GERD (gastroesophageal reflux disease)    ?  Marland Kitchen Hyperlipidemia with target LDL less than 70   . Hypertension   . Shortness of breath dyspnea    with exertion  . STEMI (ST elevation myocardial infarction) ([place]) 06/16/2015   BMS x 2 CFX  . Thyroid nodule    benign    Medications:  Medications Prior to Admission  Medication Sig Dispense Refill Last Dose  . aspirin EC 81 MG tablet Take 1 tablet (81 mg total) by mouth daily. 30 tablet 6 11/24/2020 at Unknown time  . atorvastatin (LIPITOR) 80 MG tablet Take 80 mg by mouth daily.   11/23/2020 at Unknown time  . estradiol (ESTRACE) 1 MG tablet Take 1 mg by mouth daily.   11/24/2020 at Unknown time  . ferrous sulfate (FEROSUL) 325 (65 FE) MG tablet Take 1 tablet (325 mg total) by mouth  daily with breakfast. 30 tablet 6 11/24/2020 at Unknown time  . isosorbide mononitrate (IMDUR) 30 MG 24 hr tablet Take 0.5 tablets (15 mg total) by mouth daily. 90 tablet 1 11/23/2020 at Unknown time  . lisinopril (ZESTRIL) 2.5 MG tablet Take 1 tablet (2.5 mg total) by mouth daily. 90 tablet 1 11/23/2020 at Unknown time  . metFORMIN (GLUCOPHAGE) 500 MG tablet Take 1 tablet (500 mg total) by mouth daily with breakfast. 90 tablet 1 11/23/2020 at Unknown time  . metoprolol tartrate (LOPRESSOR) 25 MG tablet Take 0.5 tablets (12.5 mg total) by mouth 2 (two) times daily. 180 tablet 3 11/23/2020 at 0800  . nitroGLYCERIN (NITROSTAT) 0.4 MG SL tablet Place 1 tablet (0.4 mg total) under the tongue every 5 (five) minutes as needed for chest pain. 25 tablet 2 unknown at Unknown time  . omega-3 fish oil (MAXEPA) 1000 MG CAPS capsule Take 1 capsule (1,000 mg total) by mouth daily. 60 capsule 11 11/23/2020 at Unknown time  . omeprazole (PRILOSEC) 20 MG capsule Take 1 capsule (20 mg total) by mouth daily.  90 capsule 1 11/23/2020 at Unknown time  . vitamin B-12 (CYANOCOBALAMIN) 1000 MCG tablet Take 1 tablet (1,000 mcg total) by mouth daily. 90 tablet 1 11/23/2020 at Unknown time    Assessment: 60 year old female with PMH significant for CAD, hx of STEMI, CHF, diabetes, HLD, and HTN presented to ED for chest pain. Pharmacy consulted to start heparin infusion for NSTEMI.  No anticoagulation prior to admission per chart review.  Heparin level came back therapeutic at 0.35, on 900 units/hr. Hgb 13.7, plt 166. Trop 15>40>8. No s/sx of bleeding or infusion issues.  Goal of Therapy:  Heparin level 0.3-0.7 units/ml Monitor platelets by anticoagulation protocol: Yes   Plan:  Continue heparin infusion at 900 units/hr Daily heparin level, CBC Monitor s/sx bleeding  Antonietta Jewel, PharmD, [facility] Pharmacist  Phone: [phone] 11/25/2020 7:26 AM  Please check AMION for all [place] phone numbers After 10:00 PM,  call [place] [phone]

## 2020-11-25 NOTE — Progress Notes (Signed)
Progress Note  Patient Name: Virginia [name] Date of Encounter: 11/25/2020  [facility] Cardiologist: Larae Grooms, MD   Subjective   Doing well this morning. Plan for LHC today. Trop down-trended BP remain elevated at 140s this AM Cr stable 0.92 LDL 167, HDL 62, TG 103  Inpatient Medications    Scheduled Meds: . aspirin EC  81 mg Oral Daily  . carvedilol  6.25 mg Oral BID WC  . insulin aspart  0-5 Units Subcutaneous QHS  . insulin aspart  0-9 Units Subcutaneous TID WC  . isosorbide mononitrate  15 mg Oral Daily  . pantoprazole  40 mg Oral Daily  . rosuvastatin  40 mg Oral QHS  . sodium chloride flush  3 mL Intravenous Q12H   Continuous Infusions: . sodium chloride    . sodium chloride 1 mL/kg/hr (11/25/20 0545)  . heparin 900 Units/hr (11/24/20 1617)   PRN Meds: sodium chloride, acetaminophen, nitroGLYCERIN, ondansetron (ZOFRAN) IV, sodium chloride flush   Vital Signs    Vitals:   11/24/20 2130 11/25/20 0630 11/25/20 0631 11/25/20 0737  BP: (!) 160/90  138/87 (!) 146/96  Pulse: 78  71 74  Resp: 14  14 18   Temp: 98.5 F (36.9 C)  98.9 F (37.2 C) 98.7 F (37.1 C)  TempSrc: Oral  Oral Oral  SpO2: 98%  99% 99%  Weight:  74.5 kg    Height:       No intake or output data in the 24 hours ending 11/25/20 0834 Last 3 Weights 11/25/2020 11/24/2020 11/24/2020  Weight (lbs) 164 lb 3.2 oz 169 lb 169 lb  Weight (kg) 74.481 kg 76.658 kg 76.658 kg      Telemetry    NSR - Personally Reviewed  ECG    NSR with no ischemic changes - Personally Reviewed  Physical Exam   GEN: No acute distress.   Neck: No JVD Cardiac: RRR, no murmurs, rubs, or gallops.  Respiratory: Clear to auscultation bilaterally. GI: Soft, nontender, non-distended  MS: No edema; No deformity. Neuro:  Nonfocal  Psych: Normal affect   Labs    High Sensitivity Troponin:   Recent Labs  Lab 11/24/20 1300 11/24/20 1600 11/24/20 1948 11/24/20 2119 11/25/20 0119  TROPONINIHS 40*  22* 11 11 8       Chemistry Recent Labs  Lab 11/24/20 1037 11/25/20 0316  NA 141 139  K 3.7 3.9  CL 106 106  CO2 27 24  GLUCOSE 88 114*  BUN 9 8  CREATININE 1.09* 0.92  CALCIUM 8.8* 8.9  GFRNONAA 58* >60  ANIONGAP 8 9     Hematology Recent Labs  Lab 11/24/20 1037 11/25/20 0316  WBC 4.8 4.6  RBC 4.24 4.47  HGB 12.6 13.7  HCT 40.1 40.8  MCV 94.6 91.3  MCH 29.7 30.6  MCHC 31.4 33.6  RDW 13.6 13.8  PLT 160 166    BNPNo results for input(s): BNP, PROBNP in the last 168 hours.   DDimer No results for input(s): DDIMER in the last 168 hours.   Radiology    DG Chest 2 View  Result Date: 11/24/2020 CLINICAL DATA:  Chest pain, pain radiating down RIGHT arm along with nausea that started overnight EXAM: CHEST - 2 VIEW COMPARISON:  October 01, 2019 FINDINGS: Trachea midline. Cardiomediastinal contours and hilar structures are normal. Lungs are clear. No sign of pleural effusion. On limited assessment no acute skeletal process. IMPRESSION: No acute cardiopulmonary disease. Electronically Signed   By: Jewel Baize.D.  On: 11/24/2020 11:03    Cardiac Studies   Echocardiogram 2016: - Left ventricle: The cavity size was normal. Wall thickness was  normal. Systolic function was normal. The estimated ejection  fraction was in the range of 60% to 65%. Wall motion was normal;  there were no regional wall motion abnormalities.  - Mitral valve: There was mild regurgitation.   LHC 2016:  Prox LAD lesion, 60% stenosed.  2nd Diag lesion, 95% stenosed.  The left ventricular systolic function is normal.  Mid LAD to Dist LAD lesion, 20% stenosed.  Normal LV function without residual wall motion abnormality with an ejection fraction of 55-65%.  Two-vessel CAD with 60% eccentric smooth tubular ostial LAD stenosis, 20% mid stenosis in the region of a very small bifurcating diagonal vessel with 95% stenosis in the inferior limb of the small diagonal branch; widely  patent tandem bare-metal stents in the AV groove circumflex vessel; normal RCA.  RECOMMENDATION: Increased medical therapy. Nitrates will be added to her regimen of beta blocker, ACE inhibitor and statin. Consider amlodipine if recurrent symptomatology.  Diagnostic Dominance: Right     Patient Profile     59 y.o. female with history of known CAD s/p overlapping BMS to LCx in 2016, HLD, DMII, and GERD who presented to the ER with episodes of substernal chest pressure similar to her anginal equivalent in the past for which Cardiology has been consulted. Now planned for cath this AM.  Assessment & Plan   #Chest pain  #Known CAD s/p PCI to LCx: Patient has known history of CAD with 60% pLAD stenosis, 95% 2nd diagonal stenosis, 20% m-dLAD stenosis, and patent tandem BMS to LCx on LHC in 2016 which was medically managed. She has been lost to follow-up since 12/2017. Now presenting with chest pain. EKG has non-specific T wave abnormalities but no STE/D. HsTrop 15>40. Possible she has had progression of her heart disease - BP is poorly controlled and most recent LDL 03/2020 was 188. Has not been taking her atorvastatin due to back pain. - NPO for cath today - Continue heparin gtt - Continue crestor 40mg daily - Continue aspirin  - Continue imdur - Follow-up TTE  #HTN: Poorly controlled.  - Continue carvedilol 6.25mg BID  - Start ARB post-cath for more aggressive BP control  #HLD: Last LDL 188 03/2020-->167 today - Continue crestor 40mg daily  #DM type 2: A1C 4.6 09/2020 - Continue ISS while admitted  #GERD:  - Continue pantoprazole while admitted    For questions or updates, please contact [facility] Please consult [email or web address] for contact info under        Signed, Talisa Petrak E Titan Karner, MD  11/25/2020, 8:34 AM   

## 2020-11-25 NOTE — Discharge Instructions (Signed)

## 2020-11-26 ENCOUNTER — Other Ambulatory Visit: Payer: Self-pay | Admitting: Physician Assistant

## 2020-11-26 ENCOUNTER — Telehealth: Payer: Self-pay

## 2020-11-26 DIAGNOSIS — I214 Non-ST elevation (NSTEMI) myocardial infarction: Principal | ICD-10-CM

## 2020-11-26 DIAGNOSIS — Z794 Long term (current) use of insulin: Secondary | ICD-10-CM

## 2020-11-26 DIAGNOSIS — Z9861 Coronary angioplasty status: Secondary | ICD-10-CM

## 2020-11-26 DIAGNOSIS — I2510 Atherosclerotic heart disease of native coronary artery without angina pectoris: Secondary | ICD-10-CM

## 2020-11-26 DIAGNOSIS — N179 Acute kidney failure, unspecified: Secondary | ICD-10-CM

## 2020-11-26 DIAGNOSIS — Z79899 Other long term (current) drug therapy: Secondary | ICD-10-CM

## 2020-11-26 LAB — BASIC METABOLIC PANEL
Anion gap: 6 (ref 5–15)
BUN: 13 mg/dL (ref 6–20)
CO2: 24 mmol/L (ref 22–32)
Calcium: 8.9 mg/dL (ref 8.9–10.3)
Chloride: 109 mmol/L (ref 98–111)
Creatinine, Ser: 1.18 mg/dL — ABNORMAL HIGH (ref 0.44–1.00)
GFR, Estimated: 53 mL/min — ABNORMAL LOW (ref >60)
Glucose, Bld: 102 mg/dL — ABNORMAL HIGH (ref 70–99)
Potassium: 4.3 mmol/L (ref 3.5–5.1)
Sodium: 139 mmol/L (ref 135–145)

## 2020-11-26 LAB — CBC
HCT: 39.1 % (ref 36.0–46.0)
Hemoglobin: 13 g/dL (ref 12.0–15.0)
MCH: 30.6 pg (ref 26.0–34.0)
MCHC: 33.2 g/dL (ref 30.0–36.0)
MCV: 92 fL (ref 80.0–100.0)
Platelets: 159 10*3/uL (ref 150–400)
RBC: 4.25 MIL/uL (ref 3.87–5.11)
RDW: 13.9 % (ref 11.5–15.5)
WBC: 5.9 10*3/uL (ref 4.0–10.5)
nRBC: 0 % (ref 0.0–0.2)

## 2020-11-26 LAB — GLUCOSE, CAPILLARY
Glucose-Capillary: 70 mg/dL (ref 70–99)
Glucose-Capillary: 82 mg/dL (ref 70–99)

## 2020-11-26 MED ORDER — ISOSORBIDE MONONITRATE ER 30 MG PO TB24: 30.0000 mg | ORAL_TABLET | Freq: Every day | ORAL | 2 refills | Status: DC

## 2020-11-26 MED ORDER — CARVEDILOL 6.25 MG PO TABS
6.2500 mg | ORAL_TABLET | Freq: Two times a day (BID) | ORAL | 6 refills | Status: DC
Start: 2020-11-26 — End: 2020-11-26

## 2020-11-26 MED ORDER — ROSUVASTATIN CALCIUM 40 MG PO TABS: 40.0000 mg | ORAL_TABLET | Freq: Every day | ORAL | 6 refills | Status: DC

## 2020-11-26 MED ORDER — ISOSORBIDE MONONITRATE ER 30 MG PO TB24
30.0000 mg | ORAL_TABLET | Freq: Every day | ORAL | Status: DC
  Administered 2020-11-26: 30 mg via ORAL
  Filled 2020-11-26: qty 1

## 2020-11-26 MED ORDER — TICAGRELOR 90 MG PO TABS: 90.0000 mg | ORAL_TABLET | Freq: Two times a day (BID) | ORAL | 11 refills | Status: DC

## 2020-11-26 MED FILL — Verapamil HCl IV Soln 2.5 MG/ML: INTRAVENOUS | Qty: 2 | Status: AC

## 2020-11-26 MED FILL — ROSUVASTATIN CALCIUM 40 MG: 40 | 30 days supply | Qty: 30 | Fill #0

## 2020-11-26 MED FILL — ISOSORBIDE MN ER 30 MG TAB: 30 | 30 days supply | Qty: 30 | Fill #0

## 2020-11-26 MED FILL — CARVEDILOL 6.25 MG TABLET: 6.25 | 30 days supply | Qty: 60 | Fill #0

## 2020-11-26 NOTE — Progress Notes (Addendum)
Progress Note  Patient Name: Virginia [name] Date of Encounter: 11/26/2020  [facility] Cardiologist: Larae Grooms, MD   Subjective   Doing well this morning. Abdominal pain completely resolved. Groin site soft and c/d/i. Ready to go home.  Cath with 95% prox LAD stenosis s/p successful PCI. D2 95% stenosed, ostial LAD 50%, prox-mid RCA 50%, distal RCA 50%  Had episode of severe abdominal/back pain last night. Groin site c/d/i. H/H stable. CT abdomen/pelvis with no evidence of bleed  TTE with normal LVEF 60-65%, G2DD  Inpatient Medications    Scheduled Meds: . aspirin EC  81 mg Oral Daily  . carvedilol  6.25 mg Oral BID WC  . influenza vac split quadrivalent PF  0.5 mL Intramuscular Tomorrow-1000  . insulin aspart  0-5 Units Subcutaneous QHS  . insulin aspart  0-9 Units Subcutaneous TID WC  . isosorbide mononitrate  15 mg Oral Daily  . pantoprazole  40 mg Oral Daily  . rosuvastatin  40 mg Oral QHS  . sodium chloride flush  3 mL Intravenous Q12H  . sodium chloride flush  3 mL Intravenous Q12H  . ticagrelor  90 mg Oral BID   Continuous Infusions: . sodium chloride     PRN Meds: sodium chloride, acetaminophen, nitroGLYCERIN, ondansetron (ZOFRAN) IV, sodium chloride flush   Vital Signs    Vitals:   11/25/20 2200 11/26/20 0000 11/26/20 0320 11/26/20 0358  BP: 108/75 (!) 102/57  125/77  Pulse: 72 69  63  Resp: 18 17  19   Temp:  97.8 F (36.6 C)  98.6 F (37 C)  TempSrc:  Oral  Oral  SpO2: 100% 98%  99%  Weight:   74.9 kg   Height:        Intake/Output Summary (Last 24 hours) at 11/26/2020 0838 Last data filed at 11/26/2020 0300 Gross per 24 hour  Intake 921.55 ml  Output -  Net 921.55 ml   Last 3 Weights 11/26/2020 11/25/2020 11/24/2020  Weight (lbs) 165 lb 2 oz 164 lb 3.2 oz 169 lb  Weight (kg) 74.9 kg 74.481 kg 76.658 kg      Telemetry    NSR - Personally Reviewed  ECG    NSR - Personally Reviewed  Physical Exam   GEN: No acute distress.    Neck: No JVD Cardiac: RRR, no murmurs, rubs, or gallops.  Respiratory: Clear to auscultation bilaterally. GI: Soft, nontender, non-distended. Right groin access site mildly tender to palpation but no hematoma.   MS: No edema; No deformity. Neuro:  Nonfocal  Psych: Normal affect   Labs    High Sensitivity Troponin:   Recent Labs  Lab 11/24/20 1300 11/24/20 1600 11/24/20 1948 11/24/20 2119 11/25/20 0119  TROPONINIHS 40* 22* 11 11 8       Chemistry Recent Labs  Lab 11/24/20 1037 11/25/20 0316 11/26/20 0246  NA 141 139 139  K 3.7 3.9 4.3  CL 106 106 109  CO2 27 24 24   GLUCOSE 88 114* 102*  BUN 9 8 13   CREATININE 1.09* 0.92 1.18*  CALCIUM 8.8* 8.9 8.9  GFRNONAA 58* >60 53*  ANIONGAP 8 9 6      Hematology Recent Labs  Lab 11/24/20 1037 11/25/20 0316 11/25/20 1807 11/26/20 0246  WBC 4.8 4.6  --  5.9  RBC 4.24 4.47  --  4.25  HGB 12.6 13.7 13.0 13.0  HCT 40.1 40.8 39.2 39.1  MCV 94.6 91.3  --  92.0  MCH 29.7 30.6  --  30.6  MCHC  31.4 33.6  --  33.2  RDW 13.6 13.8  --  13.9  PLT 160 166  --  159    BNPNo results for input(s): BNP, PROBNP in the last 168 hours.   DDimer No results for input(s): DDIMER in the last 168 hours.   Radiology    CT ABDOMEN PELVIS WO CONTRAST  Result Date: 11/25/2020 CLINICAL DATA:  Abdominal pain, concern for hematoma/bleed after catheterization EXAM: CT ABDOMEN AND PELVIS WITHOUT CONTRAST TECHNIQUE: Multidetector CT imaging of the abdomen and pelvis was performed following the standard protocol without IV contrast. COMPARISON:  None. FINDINGS: Lower chest: The visualized heart size within normal limits. No pericardial fluid/thickening. No hiatal hernia. The visualized portions of the lungs are clear. Hepatobiliary: Although limited due to the lack of intravenous contrast, normal in appearance without gross focal abnormality. No evidence of calcified gallstones or biliary ductal dilatation. Pancreas:  Unremarkable.  No surrounding  inflammatory changes. Spleen: Normal in size. Although limited due to the lack of intravenous contrast, normal in appearance. Adrenals/Urinary Tract: Both adrenal glands appear normal. The kidneys and collecting system appear normal without evidence of urinary tract calculus or hydronephrosis. Bladder is unremarkable. Stomach/Bowel: The stomach, small bowel, and colon are normal in appearance. No inflammatory changes or obstructive findings. appendix is normal. Vascular/Lymphatic: There are no enlarged abdominal or pelvic lymph nodes. Scattered aortic atherosclerotic calcifications are seen without aneurysmal dilatation. Reproductive: The patient is status post hysterectomy. No adnexal masses or collections seen. Other: Stranding changes seen within the right inguinal region, likely from post catheterization changes. No soft tissue hematoma or loculated fluid collections. Musculoskeletal: No acute or significant osseous findings. IMPRESSION: Postprocedural changes within the right inguinal region. No evidence of inguinal or retroperitoneal hematoma. Aortic Atherosclerosis (ICD10-I70.0). Electronically Signed   By: Prudencio Pair M.D.   On: 11/25/2020 21:02   DG Chest 2 View  Result Date: 11/24/2020 CLINICAL DATA:  Chest pain, pain radiating down RIGHT arm along with nausea that started overnight EXAM: CHEST - 2 VIEW COMPARISON:  October 01, 2019 FINDINGS: Trachea midline. Cardiomediastinal contours and hilar structures are normal. Lungs are clear. No sign of pleural effusion. On limited assessment no acute skeletal process. IMPRESSION: No acute cardiopulmonary disease. Electronically Signed   By: Zetta Bills M.D.   On: 11/24/2020 11:03   CARDIAC CATHETERIZATION  Result Date: 11/25/2020  2nd Diag lesion is 95% stenosed.  Mid LAD to Dist LAD lesion is 20% stenosed.  Non-stenotic Mid Cx to Dist Cx lesion was previously treated.  Ost LAD to Prox LAD lesion is 50% stenosed.  Prox LAD lesion is 95%  stenosed.  A drug-eluting stent was successfully placed using a STENT RESOLUTE ONYX [id number].  Post intervention, there is a 0% residual stenosis.  Prox RCA to Mid RCA lesion is 50% stenosed.  Dist RCA lesion is 50% stenosed.  1.  Severe de novo proximal LAD stenosis, treated successfully with a 2.75 x 15 mm resolute Onyx DES 2.  Continued patency of overlapping bare-metal stents in the left circumflex 3.  Mild to moderate diffuse nonobstructive coronary artery disease involving the ostial/proximal LAD, proximal circumflex, and mid/distal RCA, all appropriate for continued intensive medical therapy Recommendations: Dual antiplatelet therapy with aspirin and ticagrelor 12 months without interruption (ACS class I recommendation), aggressive medical therapy   ECHOCARDIOGRAM COMPLETE  Result Date: 11/25/2020    ECHOCARDIOGRAM REPORT   Patient Name:   Benna Dunks Date of Exam: 11/25/2020 Medical Rec #:  [id number]  Height:       66.0 in Accession #:    [id number]      Weight:       164.2 lb Date of Birth:  1961/07/14        BSA:          1.839 m Patient Age:    37 years        BP:           153/82 mmHg Patient Gender: F               HR:           62 bpm. Exam Location:  Inpatient Procedure: 2D Echo, 3D Echo, Cardiac Doppler and Color Doppler Indications:    R07.9* Chest pain, unspecified  History:        Patient has prior history of Echocardiogram examinations, most                 recent 06/18/2015. Previous Myocardial Infarction and CAD,                 Abnormal ECG, Signs/Symptoms:Chest Pain and Syncope; Risk                 Factors:Diabetes and Hypertension.  Sonographer:    Milo Referring Phys: [id number] Springer  1. Left ventricular ejection fraction, by estimation, is 60 to 65%. The left ventricle has normal function. The left ventricle has no regional wall motion abnormalities. There is mild concentric left ventricular hypertrophy. Left ventricular diastolic parameters  are consistent with Grade II diastolic dysfunction (pseudonormalization).  2. Right ventricular systolic function is normal. The right ventricular size is normal. There is normal pulmonary artery systolic pressure.  3. The mitral valve is normal in structure. Trivial mitral valve regurgitation. No evidence of mitral stenosis.  4. The aortic valve is tricuspid. Aortic valve regurgitation is not visualized. No aortic stenosis is present.  5. The inferior vena cava is normal in size with greater than 50% respiratory variability, suggesting right atrial pressure of 3 mmHg. FINDINGS  Left Ventricle: Left ventricular ejection fraction, by estimation, is 60 to 65%. The left ventricle has normal function. The left ventricle has no regional wall motion abnormalities. The left ventricular internal cavity size was normal in size. There is  mild concentric left ventricular hypertrophy. Left ventricular diastolic parameters are consistent with Grade II diastolic dysfunction (pseudonormalization). Indeterminate filling pressures. Right Ventricle: The right ventricular size is normal. No increase in right ventricular wall thickness. Right ventricular systolic function is normal. There is normal pulmonary artery systolic pressure. The tricuspid regurgitant velocity is 2.05 m/s, and  with an assumed right atrial pressure of 3 mmHg, the estimated right ventricular systolic pressure is [id number] mmHg. Left Atrium: Left atrial size was normal in size. Right Atrium: Right atrial size was normal in size. Pericardium: There is no evidence of pericardial effusion. Mitral Valve: The mitral valve is normal in structure. Trivial mitral valve regurgitation. No evidence of mitral valve stenosis. Tricuspid Valve: The tricuspid valve is normal in structure. Tricuspid valve regurgitation is mild . No evidence of tricuspid stenosis. Aortic Valve: The aortic valve is tricuspid. Aortic valve regurgitation is not visualized. No aortic stenosis is present.  Pulmonic Valve: The pulmonic valve was normal in structure. Pulmonic valve regurgitation is not visualized. No evidence of pulmonic stenosis. Aorta: The aortic root is normal in size and structure. Venous: The inferior vena cava is normal in size with greater than 50%  respiratory variability, suggesting right atrial pressure of 3 mmHg. IAS/Shunts: No atrial level shunt detected by color flow Doppler.  LEFT VENTRICLE PLAX 2D LVIDd:         3.80 cm     Diastology LVIDs:         2.70 cm     LV e' medial:    8.38 cm/s LV PW:         1.30 cm     LV E/e' medial:  12.2 LV IVS:        1.10 cm     LV e' lateral:   8.16 cm/s LVOT diam:     1.60 cm     LV E/e' lateral: 12.5 LV SV:         42 LV SV Index:   23 LVOT Area:     2.01 cm  LV Volumes (MOD) LV vol d, MOD A2C: 66.7 ml LV vol d, MOD A4C: 54.5 ml LV vol s, MOD A2C: 25.5 ml LV vol s, MOD A4C: 18.2 ml LV SV MOD A2C:     41.2 ml LV SV MOD A4C:     54.5 ml LV SV MOD BP:      39.2 ml RIGHT VENTRICLE             IVC RV S prime:     11.70 cm/s  IVC diam: 1.15 cm TAPSE (M-mode): 2.2 cm LEFT ATRIUM             Index       RIGHT ATRIUM           Index LA diam:        2.50 cm 1.36 cm/m  RA Area:     11.00 cm LA Vol (A2C):   25.9 ml 14.08 ml/m RA Volume:   24.30 ml  13.21 ml/m LA Vol (A4C):   17.8 ml 9.68 ml/m LA Biplane Vol: 22.0 ml 11.96 ml/m  AORTIC VALVE LVOT Vmax:   94.20 cm/s LVOT Vmean:  68.600 cm/s LVOT VTI:    0.211 m  AORTA Ao Root diam: 2.70 cm Ao Asc diam:  2.60 cm MITRAL VALVE                TRICUSPID VALVE MV Area (PHT): 6.71 cm     TR Peak grad:   16.8 mmHg MV Decel Time: 113 msec     TR Vmax:        205.00 cm/s MV E velocity: 102.00 cm/s MV A velocity: 80.40 cm/s   SHUNTS MV E/A ratio:  1.27         Systemic VTI:  0.21 m                             Systemic Diam: 1.60 cm Skeet Latch MD Electronically signed by Skeet Latch MD Signature Date/Time: 11/25/2020/5:02:27 PM    Final     Cardiac Studies  Cath 11/25/20:  2nd Diag lesion is 95%  stenosed.  Mid LAD to Dist LAD lesion is 20% stenosed.  Non-stenotic Mid Cx to Dist Cx lesion was previously treated.  Ost LAD to Prox LAD lesion is 50% stenosed.  Prox LAD lesion is 95% stenosed.  A drug-eluting stent was successfully placed using a STENT RESOLUTE ONYX [id number].  Post intervention, there is a 0% residual stenosis.  Prox RCA to Mid RCA lesion is 50% stenosed.  Dist RCA lesion is 50% stenosed.   1.  Severe de novo proximal LAD stenosis, treated successfully with a 2.75 x 15 mm resolute Onyx DES 2.  Continued patency of overlapping bare-metal stents in the left circumflex 3.  Mild to moderate diffuse nonobstructive coronary artery disease involving the ostial/proximal LAD, proximal circumflex, and mid/distal RCA, all appropriate for continued intensive medical therapy  Recommendations: Dual antiplatelet therapy with aspirin and ticagrelor 12 months without interruption (ACS class I recommendation), aggressive medical therapy  Diagnostic Dominance: Right    Intervention      Echo 11/25/20: IMPRESSIONS  1. Left ventricular ejection fraction, by estimation, is 60 to 65%. The  left ventricle has normal function. The left ventricle has no regional  wall motion abnormalities. There is mild concentric left ventricular  hypertrophy. Left ventricular diastolic parameters are consistent with Grade II diastolic dysfunction  (pseudonormalization).  2. Right ventricular systolic function is normal. The right ventricular  size is normal. There is normal pulmonary artery systolic pressure.  3. The mitral valve is normal in structure. Trivial mitral valve  regurgitation. No evidence of mitral stenosis.  4. The aortic valve is tricuspid. Aortic valve regurgitation is not  visualized. No aortic stenosis is present.  5. The inferior vena cava is normal in size with greater than 50%  respiratory variability, suggesting right atrial pressure of 3 mmHg.  CT abd.pelvis  11/25/20: FINDINGS: Lower chest: The visualized heart size within normal limits. No pericardial fluid/thickening.  No hiatal hernia.  The visualized portions of the lungs are clear.  Hepatobiliary: Although limited due to the lack of intravenous contrast, normal in appearance without gross focal abnormality. No evidence of calcified gallstones or biliary ductal dilatation.  Pancreas:  Unremarkable.  No surrounding inflammatory changes.  Spleen: Normal in size. Although limited due to the lack of intravenous contrast, normal in appearance.  Adrenals/Urinary Tract: Both adrenal glands appear normal. The kidneys and collecting system appear normal without evidence of urinary tract calculus or hydronephrosis. Bladder is unremarkable.  Stomach/Bowel: The stomach, small bowel, and colon are normal in appearance. No inflammatory changes or obstructive findings. appendix is normal.  Vascular/Lymphatic: There are no enlarged abdominal or pelvic lymph nodes. Scattered aortic atherosclerotic calcifications are seen without aneurysmal dilatation.  Reproductive: The patient is status post hysterectomy. No adnexal masses or collections seen.  Other: Stranding changes seen within the right inguinal region, likely from post catheterization changes. No soft tissue hematoma or loculated fluid collections.  Musculoskeletal: No acute or significant osseous findings.  IMPRESSION: Postprocedural changes within the right inguinal region. No evidence of inguinal or retroperitoneal hematoma.  Aortic Atherosclerosis (ICD10-I70.0).    Echocardiogram 2016: - Left ventricle: The cavity size was normal. Wall thickness was  normal. Systolic function was normal. The estimated ejection  fraction was in the range of 60% to 65%. Wall motion was normal;  there were no regional wall motion abnormalities.  - Mitral valve: There was mild regurgitation.   [place] 2016:  Prox LAD lesion,  60% stenosed.  2nd Diag lesion, 95% stenosed.  The left ventricular systolic function is normal.  Mid LAD to Dist LAD lesion, 20% stenosed.  Normal LV function without residual wall motion abnormality with an ejection fraction of 55-65%.  Two-vessel CAD with 60% eccentric smooth tubular ostial LAD stenosis, 20% mid stenosis in the region of a very small bifurcating diagonal vessel with 95% stenosis in the inferior limb of the small diagonal branch; widely patent tandem bare-metal stents in the AV groove circumflex vessel; normal RCA.  RECOMMENDATION: Increased medical therapy. Nitrates  will be added to her regimen of beta blocker, ACE inhibitor and statin. Consider amlodipine if recurrent symptomatology.  Diagnostic Dominance: Right     Patient Profile     60 y.o. female with history of known CAD s/p overlapping BMS to LCx in 2016, HLD, DMII, and GERD who presented to the ER with episodes of substernal chest pressure similar to her anginal equivalent found to have prox LAD 95% stenosis s/p successful PCI.  Assessment & Plan    #NSTEMI s/p PCI to LAD Patient with known history of CAD s/p BMS to LCx in the past as detailed above. Presented to ER with episode of chest discomfort similar to anginal equivalent. EKG with non-specific T wave abnormalities but no STE/D. HsTrop 15>40. She underwent coronary angiography on 11/25/20 which revealed 95% prox LAD,  D2 95%, ostial-prox LAD 50%, prox-mid RCA 50%, distal RCA 50%. S/p PCI to prox LAD lesion with excellent angiographic results. Needs aggressive medical management to prevent progression of other known disease - S/p successful PCI to LAD - Had abdominal pain overnight but CT abd/pelvis reassuring with no evidence of bleed - Continue aspirinand ticagrelor for planned 12 month course - Continue coreg 6.25mg  BID - Start ARB as out-patient--will hold today as Cr up slightly post-cath - Continue imdur 15mg  daily  #HTN:Poorly  controlled.  -Continue carvedilol 6.25mg  BID  - Start ARB as out-patient--will hold today given slight rise in Cr  #HLD:Last LDL 188 03/2020-->167 today - Continue crestor 40mg  daily  #DM type 2:A1C 4.6 09/2020 - Continue ISS while admitted  #GERD: - Continue pantoprazole while admitted   Will discharge home today. Repeat BMET in 1 week. Add ARB as out-patient. Follow-up with Dr. Irish Lack.  Will change home metop-->coreg as above Increase Imdur to 30mg  daily Plan to change lisinopril to ARB as out-patient; will not start now due to slight bump in Cr Continue DAPT x86months Crestor 40mg    For questions or updates, please contact [place] Please consult [email or web address] for contact info under        Signed, Freada Bergeron, MD  11/26/2020, 8:38 AM

## 2020-11-26 NOTE — Telephone Encounter (Signed)
I spoke with Robbie Lis PA and he reports the pt is being d/c'd  after MI and needs samples of Brilinta... she has already used her 30 day free card and will need further help with being able to afford it after D/C.   Brilinta samples left at the front for 1 month  Lot [id number] Exp 02/05/2023  She was also given coupon and number info for the pt assistance program.   Will forward to [place] LPN for further follow up.

## 2020-11-26 NOTE — TOC Transition Note (Signed)
Transition of Care (TOC) - CM/SW Discharge Note Marvetta Gibbons RN, BSN Transitions of Care Unit 4E- RN Case Manager See Treatment Team for direct phone # Cross coverage for [place]   Patient Details  Name: Virginia [name] MRN: [id number] Date of Birth: 1961/08/04  Transition of [facility]) CM/SW Contact:  Dawayne Patricia, RN Phone Number: 11/26/2020, 12:33 PM   Clinical Narrative:    Pt stable for transition home, will need Brilinta- contacted by [place] that pt has already used 30 day free card and not eligible for 30 day free again (can only use once per lifetime)- contacted Cards PA to discuss options. If pt to stay on Brilinta will need to apply for pt assistance- msg sent via in basket to D. Dunn who will see pt in f/u.   Received msg back from Cards PA that pt is to go to [address] office to pick up Brilinta (30 day supply) and that office will contact her to assist her with pt assistance application. Call made to pt to instruct her to go to [address] office to pick up [place] to fill remaining meds and deliver to bedside.    Final next level of care: Home/Self Care Barriers to Discharge: No Barriers Identified   Patient Goals and CMS Choice    NA    Discharge Placement               Home        Discharge Plan and Services   Discharge Planning Services: CM Consult,Medication Assistance Post Acute Care Choice: NA          DME Arranged: N/A DME Agency: NA       HH Arranged: NA HH Agency: NA        Social Determinants of Health (SDOH) Interventions     Readmission Risk Interventions Readmission Risk Prevention Plan 11/26/2020  Post Dischage Appt Complete  Medication Screening Complete  Transportation Screening Complete  Some recent data might be hidden

## 2020-11-26 NOTE — Discharge Summary (Addendum)
Discharge Summary    Patient ID: HAMPTON SCHEFFLER MRN: [id number]; DOB: August 15, 1961  Admit date: 11/24/2020 Discharge date: 11/26/2020  Primary Care Provider: Tresa Garter, MD  Primary Cardiologist: Larae Grooms, MD    Discharge Diagnoses    Principal Problem:   NSTEMI (non-ST elevated myocardial infarction) [facility]) Active Problems:   Hyperlipidemia with target LDL less than 70   Diabetes mellitus type 2, controlled ([place])   CAD S/P CFX BMS (? med complinace) x 2 06/16/15   Essential hypertension   Unstable angina ([place])   AKI   Diagnostic Studies/Procedures    Cath 11/25/20:  2nd Diag lesion is 95% stenosed.  Mid LAD to Dist LAD lesion is 20% stenosed.  Non-stenotic Mid Cx to Dist Cx lesion was previously treated.  Ost LAD to Prox LAD lesion is 50% stenosed.  Prox LAD lesion is 95% stenosed.  A drug-eluting stent was successfully placed using a STENT RESOLUTE ONYX [id number].  Post intervention, there is a 0% residual stenosis.  Prox RCA to Mid RCA lesion is 50% stenosed.  Dist RCA lesion is 50% stenosed.  1. Severe de novo proximal LAD stenosis, treated successfully with a 2.75 x 15 mm resolute Onyx DES 2. Continued patency of overlapping bare-metal stents in the left circumflex 3. Mild to moderate diffuse nonobstructive coronary artery disease involving the ostial/proximal LAD, proximal circumflex, and mid/distal RCA, all appropriate for continued intensive medical therapy  Recommendations: Dual antiplatelet therapy with aspirin and ticagrelor 12 months without interruption (ACS class I recommendation), aggressive medical therapy  Diagnostic Dominance: Right    Intervention      Echo 11/25/20: IMPRESSIONS  1. Left ventricular ejection fraction, by estimation, is 60 to 65%. The  left ventricle has normal function. The left ventricle has no regional  wall motion abnormalities. There is mild concentric left ventricular  hypertrophy.  Left ventricular diastolic parameters are consistent with Grade II diastolic dysfunction  (pseudonormalization).  2. Right ventricular systolic function is normal. The right ventricular  size is normal. There is normal pulmonary artery systolic pressure.  3. The mitral valve is normal in structure. Trivial mitral valve  regurgitation. No evidence of mitral stenosis.  4. The aortic valve is tricuspid. Aortic valve regurgitation is not  visualized. No aortic stenosis is present.  5. The inferior vena cava is normal in size with greater than 50%  respiratory variability, suggesting right atrial pressure of 3 mmHg.  CT abd.pelvis 11/25/20: FINDINGS: Lower chest: The visualized heart size within normal limits. No pericardial fluid/thickening.  No hiatal hernia.  The visualized portions of the lungs are clear.  Hepatobiliary: Although limited due to the lack of intravenous contrast, normal in appearance without gross focal abnormality. No evidence of calcified gallstones or biliary ductal dilatation.  Pancreas: Unremarkable. No surrounding inflammatory changes.  Spleen: Normal in size. Although limited due to the lack of intravenous contrast, normal in appearance.  Adrenals/Urinary Tract: Both adrenal glands appear normal. The kidneys and collecting system appear normal without evidence of urinary tract calculus or hydronephrosis. Bladder is unremarkable.  Stomach/Bowel: The stomach, small bowel, and colon are normal in appearance. No inflammatory changes or obstructive findings. appendix is normal.  Vascular/Lymphatic: There are no enlarged abdominal or pelvic lymph nodes. Scattered aortic atherosclerotic calcifications are seen without aneurysmal dilatation.  Reproductive: The patient is status post hysterectomy. No adnexal masses or collections seen.  Other: Stranding changes seen within the right inguinal region, likely from post catheterization changes. No  soft tissue hematoma or loculated fluid  collections.  Musculoskeletal: No acute or significant osseous findings.  IMPRESSION: Postprocedural changes within the right inguinal region. No evidence of inguinal or retroperitoneal hematoma.  Aortic Atherosclerosis (ICD10-I70.0).    History of Present Illness     Virginia [name] is a 60 y.o. female with a PMH of of CAD s/p overlapping BMS to LCx in 2016, HTN, HLD, DM type 2, and GERD, who presented with chest pain.  She was last evaluated by cardiology at an outpatient visit with Dr. Irish Lack in 2019, at which time she had some atypical chest discomfort, though stress test the month prior was low risk and decision made to medically manage her symptoms. She was recommended to follow-up in 6 months, however has not been seen since that time.   She woke from sleep with chest pressure and shortness of breath at the day of arrival. Pain lasted a couple minutes and resolved spontaneously however reoccurred. No associated symptoms. Also had burning sensation in chest. This was reminiscent of her prior MI which prompted her to activate EMS.    ED course: hypertensive, otherwise VSS. Labs notable for K 3.7, Cr 1.09, CBC wnl, HsTrop 15>40. CXR without acute findings. EKG with sinus rhythm, rate 80 bpm, non-specific T wave abnormalities, no STE/D. She was given a GI cocktail and 1 SL nitro in the ED. Started on a heparin gtt given rise in troponin.   Hospital Course     Consultants: None  1. NSTEMI s/p PCI to LAD - Hs-Troponin peaked at 83. She underwent coronary angiography on 11/25/20 which revealed 95% prox LAD,  D2 95%, ostial-prox LAD 50%, prox-mid RCA 50%, distal RCA 50%. S/p PCI to prox LAD lesion with excellent angiographic results. Recomended aggressive medical management to prevent progression of other known disease. Echo showed LVEF of 60-65%, no WM abnormality. Ambulated well. CRP II as outpatient.  - Continue aspirinand ticagrelor for  planned 12 month course - Continue coreg 6.25mg  BID - Start ARB as out-patient--will hold today as Cr up slightly post-cath (DC home lisinopril) - Continue imdur 30mg  daily  2. HTN: - Poorly controlled on arrival >> improved on coreg  -Continuecarvedilol 6.25mg  BID  -Start ARB as out-patient--will hold  given slight rise in Cr  3. HLD: - 11/25/2020: Cholesterol 250; HDL 62; LDL Cholesterol 167; Triglycerides 103; VLDL 21Last  - LDL goal less that 70 -Continuecrestor 40mg  daily (was on lipitor)  - Repeat lab in 6 weeks  4. DM type 2: - A1C 5.9 on 11/25/20 - Continue ISS while admitted - Resume home meds   5. GERD: - treated with  pantoprazole while admitted - resume home omeprazole at discharge   6. AKI: - Scr bumped minimally to 1.18 post cath - Repeat BMET in 1 week and than start ARB   7. Abdominal Pain: - Patient had abdominal pain and nausea post cath - Groin site stable - H/H stable - CT abdomen/pelvis with no evidence of bleed - Continue PPI. Outpatient follow up with PCP if no improvement   Did the patient have an acute coronary syndrome (MI, NSTEMI, STEMI, etc) this admission?:  Yes                               AHA/ACC Clinical Performance & Quality Measures: 1. Aspirin prescribed? - Yes 2. ADP Receptor Inhibitor (Plavix/Clopidogrel, Brilinta/Ticagrelor or Effient/Prasugrel) prescribed (includes medically managed patients)? - Yes 3. Beta Blocker prescribed? - Yes 4. High Intensity  Statin (Lipitor 40-80mg  or Crestor 20-40mg ) prescribed? - Yes 5. EF assessed during THIS hospitalization? - Yes 6. For EF <40%, was ACEI/ARB prescribed? - No - Reason:  Plan to start ARB as outpatient  7. For EF <40%, Aldosterone Antagonist (Spironolactone or Eplerenone) prescribed? - Not Applicable (EF >/= [id number]) 8. Cardiac Rehab Phase II ordered (including medically managed patients)? - Yes     Discharge Vitals Blood pressure 125/77, pulse 63, temperature 98.6 F (37  C), temperature source Oral, resp. rate 19, height 5\' 6"  (1.676 m), weight 74.9 kg, last menstrual period 12/29/2015, SpO2 99 %.  Filed Weights   11/24/20 1116 11/25/20 0630 11/26/20 0320  Weight: 76.7 kg 74.5 kg 74.9 kg    Labs & Radiologic Studies    CBC Recent Labs    11/25/20 0316 11/25/20 1807 11/26/20 0246  WBC 4.6  --  5.9  HGB 13.7 13.0 13.0  HCT 40.8 39.2 39.1  MCV 91.3  --  92.0  PLT 166  --  [id number]   Basic Metabolic Panel Recent Labs    11/25/20 0316 11/26/20 0246  NA 139 139  K 3.9 4.3  CL 106 109  CO2 24 24  GLUCOSE 114* 102*  BUN 8 13  CREATININE 0.92 1.18*  CALCIUM 8.9 8.9   High Sensitivity Troponin:   Recent Labs  Lab 11/24/20 1300 11/24/20 1600 11/24/20 1948 11/24/20 2119 11/25/20 0119  TROPONINIHS 40* 22* 11 11 8     Hemoglobin A1C Recent Labs    11/25/20 1807  HGBA1C 5.9*   Fasting Lipid Panel Recent Labs    11/25/20 0316  CHOL 250*  HDL 62  LDLCALC 167*  TRIG 103  CHOLHDL 4.0  _____________  CT ABDOMEN PELVIS WO CONTRAST  Result Date: 11/25/2020 CLINICAL DATA:  Abdominal pain, concern for hematoma/bleed after catheterization EXAM: CT ABDOMEN AND PELVIS WITHOUT CONTRAST TECHNIQUE: Multidetector CT imaging of the abdomen and pelvis was performed following the standard protocol without IV contrast. COMPARISON:  None. FINDINGS: Lower chest: The visualized heart size within normal limits. No pericardial fluid/thickening. No hiatal hernia. The visualized portions of the lungs are clear. Hepatobiliary: Although limited due to the lack of intravenous contrast, normal in appearance without gross focal abnormality. No evidence of calcified gallstones or biliary ductal dilatation. Pancreas:  Unremarkable.  No surrounding inflammatory changes. Spleen: Normal in size. Although limited due to the lack of intravenous contrast, normal in appearance. Adrenals/Urinary Tract: Both adrenal glands appear normal. The kidneys and collecting system appear  normal without evidence of urinary tract calculus or hydronephrosis. Bladder is unremarkable. Stomach/Bowel: The stomach, small bowel, and colon are normal in appearance. No inflammatory changes or obstructive findings. appendix is normal. Vascular/Lymphatic: There are no enlarged abdominal or pelvic lymph nodes. Scattered aortic atherosclerotic calcifications are seen without aneurysmal dilatation. Reproductive: The patient is status post hysterectomy. No adnexal masses or collections seen. Other: Stranding changes seen within the right inguinal region, likely from post catheterization changes. No soft tissue hematoma or loculated fluid collections. Musculoskeletal: No acute or significant osseous findings. IMPRESSION: Postprocedural changes within the right inguinal region. No evidence of inguinal or retroperitoneal hematoma. Aortic Atherosclerosis (ICD10-I70.0). Electronically Signed   By: Prudencio Pair M.D.   On: 11/25/2020 21:02   DG Chest 2 View  Result Date: 11/24/2020 CLINICAL DATA:  Chest pain, pain radiating down RIGHT arm along with nausea that started overnight EXAM: CHEST - 2 VIEW COMPARISON:  October 01, 2019 FINDINGS: Trachea midline. Cardiomediastinal contours and hilar structures are normal. Lungs  are clear. No sign of pleural effusion. On limited assessment no acute skeletal process. IMPRESSION: No acute cardiopulmonary disease. Electronically Signed   By: Zetta Bills M.D.   On: 11/24/2020 11:03   CARDIAC CATHETERIZATION  Result Date: 11/25/2020  2nd Diag lesion is 95% stenosed.  Mid LAD to Dist LAD lesion is 20% stenosed.  Non-stenotic Mid Cx to Dist Cx lesion was previously treated.  Ost LAD to Prox LAD lesion is 50% stenosed.  Prox LAD lesion is 95% stenosed.  A drug-eluting stent was successfully placed using a STENT RESOLUTE ONYX [id number].  Post intervention, there is a 0% residual stenosis.  Prox RCA to Mid RCA lesion is 50% stenosed.  Dist RCA lesion is 50% stenosed.  1.   Severe de novo proximal LAD stenosis, treated successfully with a 2.75 x 15 mm resolute Onyx DES 2.  Continued patency of overlapping bare-metal stents in the left circumflex 3.  Mild to moderate diffuse nonobstructive coronary artery disease involving the ostial/proximal LAD, proximal circumflex, and mid/distal RCA, all appropriate for continued intensive medical therapy Recommendations: Dual antiplatelet therapy with aspirin and ticagrelor 12 months without interruption (ACS class I recommendation), aggressive medical therapy   ECHOCARDIOGRAM COMPLETE  Result Date: 11/25/2020    ECHOCARDIOGRAM REPORT   Patient Name:   Benna Dunks Date of Exam: 11/25/2020 Medical Rec #:  [id number]       Height:       66.0 in Accession #:    [id number]      Weight:       164.2 lb Date of Birth:  1961-03-30        BSA:          1.839 m Patient Age:    3 years        BP:           153/82 mmHg Patient Gender: F               HR:           62 bpm. Exam Location:  Inpatient Procedure: 2D Echo, 3D Echo, Cardiac Doppler and Color Doppler Indications:    R07.9* Chest pain, unspecified  History:        Patient has prior history of Echocardiogram examinations, most                 recent 06/18/2015. Previous Myocardial Infarction and CAD,                 Abnormal ECG, Signs/Symptoms:Chest Pain and Syncope; Risk                 Factors:Diabetes and Hypertension.  Sonographer:    [place] Referring Phys: [id number] [place]  1. Left ventricular ejection fraction, by estimation, is 60 to 65%. The left ventricle has normal function. The left ventricle has no regional wall motion abnormalities. There is mild concentric left ventricular hypertrophy. Left ventricular diastolic parameters are consistent with Grade II diastolic dysfunction (pseudonormalization).  2. Right ventricular systolic function is normal. The right ventricular size is normal. There is normal pulmonary artery systolic pressure.  3. The mitral valve  is normal in structure. Trivial mitral valve regurgitation. No evidence of mitral stenosis.  4. The aortic valve is tricuspid. Aortic valve regurgitation is not visualized. No aortic stenosis is present.  5. The inferior vena cava is normal in size with greater than 50% respiratory variability, suggesting right atrial pressure of 3 mmHg. FINDINGS  Left Ventricle:  Left ventricular ejection fraction, by estimation, is 60 to 65%. The left ventricle has normal function. The left ventricle has no regional wall motion abnormalities. The left ventricular internal cavity size was normal in size. There is  mild concentric left ventricular hypertrophy. Left ventricular diastolic parameters are consistent with Grade II diastolic dysfunction (pseudonormalization). Indeterminate filling pressures. Right Ventricle: The right ventricular size is normal. No increase in right ventricular wall thickness. Right ventricular systolic function is normal. There is normal pulmonary artery systolic pressure. The tricuspid regurgitant velocity is 2.05 m/s, and  with an assumed right atrial pressure of 3 mmHg, the estimated right ventricular systolic pressure is 96.2 mmHg. Left Atrium: Left atrial size was normal in size. Right Atrium: Right atrial size was normal in size. Pericardium: There is no evidence of pericardial effusion. Mitral Valve: The mitral valve is normal in structure. Trivial mitral valve regurgitation. No evidence of mitral valve stenosis. Tricuspid Valve: The tricuspid valve is normal in structure. Tricuspid valve regurgitation is mild . No evidence of tricuspid stenosis. Aortic Valve: The aortic valve is tricuspid. Aortic valve regurgitation is not visualized. No aortic stenosis is present. Pulmonic Valve: The pulmonic valve was normal in structure. Pulmonic valve regurgitation is not visualized. No evidence of pulmonic stenosis. Aorta: The aortic root is normal in size and structure. Venous: The inferior vena cava is  normal in size with greater than 50% respiratory variability, suggesting right atrial pressure of 3 mmHg. IAS/Shunts: No atrial level shunt detected by color flow Doppler.  LEFT VENTRICLE PLAX 2D LVIDd:         3.80 cm     Diastology LVIDs:         2.70 cm     LV e' medial:    8.38 cm/s LV PW:         1.30 cm     LV E/e' medial:  12.2 LV IVS:        1.10 cm     LV e' lateral:   8.16 cm/s LVOT diam:     1.60 cm     LV E/e' lateral: 12.5 LV SV:         42 LV SV Index:   23 LVOT Area:     2.01 cm  LV Volumes (MOD) LV vol d, MOD A2C: 66.7 ml LV vol d, MOD A4C: 54.5 ml LV vol s, MOD A2C: 25.5 ml LV vol s, MOD A4C: 18.2 ml LV SV MOD A2C:     41.2 ml LV SV MOD A4C:     54.5 ml LV SV MOD BP:      39.2 ml RIGHT VENTRICLE             IVC RV S prime:     11.70 cm/s  IVC diam: 1.15 cm TAPSE (M-mode): 2.2 cm LEFT ATRIUM             Index       RIGHT ATRIUM           Index LA diam:        2.50 cm 1.36 cm/m  RA Area:     11.00 cm LA Vol (A2C):   25.9 ml 14.08 ml/m RA Volume:   24.30 ml  13.21 ml/m LA Vol (A4C):   17.8 ml 9.68 ml/m LA Biplane Vol: 22.0 ml 11.96 ml/m  AORTIC VALVE LVOT Vmax:   94.20 cm/s LVOT Vmean:  68.600 cm/s LVOT VTI:    0.211 m  AORTA Ao Root diam: 2.70  cm Ao Asc diam:  2.60 cm MITRAL VALVE                TRICUSPID VALVE MV Area (PHT): 6.71 cm     TR Peak grad:   16.8 mmHg MV Decel Time: 113 msec     TR Vmax:        205.00 cm/s MV E velocity: 102.00 cm/s MV A velocity: 80.40 cm/s   SHUNTS MV E/A ratio:  1.27         Systemic VTI:  0.21 m                             Systemic Diam: 1.60 cm Skeet Latch MD Electronically signed by Skeet Latch MD Signature Date/Time: 11/25/2020/5:02:27 PM    Final    Disposition   Pt is being discharged home today in good condition.  Follow-up Plans & Appointments     Follow-up Information    Charlie Pitter, PA-C. Go on 12/11/2020.   Specialties: Cardiology, Radiology Why: @3 :15 for hospital follow up.  Contact information: [address] [place] [address] [phone]        [employer]. Go on 12/03/2020.   Specialty: Cardiology Why: for renal function check between 8-4 Contact information: [address], [place]. Schedule an appointment as soon as possible for a visit.   Contact information: [address] [place] [phone]             Discharge Instructions    Amb Referral to Cardiac Rehabilitation   Complete by: As directed    Diagnosis:  Coronary Stents PTCA     After initial evaluation and assessments completed: Virtual Based Care may be provided alone or in conjunction with Phase 2 Cardiac Rehab based on patient barriers.: Yes   Diet - low sodium heart healthy   Complete by: As directed    Discharge instructions   Complete by: As directed    No driving for 48 hours.  No lifting over 5 lbs for 1 week. No sexual activity for 1 week. You may return to work on 10/30/21. Keep procedure site clean & dry. If you notice increased pain, swelling, bleeding or pus, call/return!  You may shower, but no soaking baths/hot tubs/pools for 1 week.   Increase activity slowly   Complete by: As directed       Discharge Medications   Allergies as of 11/26/2020   No Known Allergies     Medication List    STOP taking these medications   atorvastatin 80 MG tablet Commonly known as: LIPITOR   lisinopril 2.5 MG tablet Commonly known as: ZESTRIL   metoprolol tartrate 25 MG tablet Commonly known as: LOPRESSOR     TAKE these medications   aspirin EC 81 MG tablet Take 1 tablet (81 mg total) by mouth daily.   carvedilol 6.25 MG tablet Commonly known as: COREG Take 1 tablet (6.25 mg total) by mouth 2 (two) times daily with a meal.   estradiol 1 MG tablet Commonly known as: ESTRACE Take 1 mg by mouth daily.   ferrous sulfate 325 (65 FE) MG  tablet Commonly known as: FeroSul Take 1 tablet (325 mg total) by mouth daily with breakfast.   isosorbide mononitrate 30 MG 24 hr tablet Commonly known  as: IMDUR Take 1 tablet (30 mg total) by mouth daily. What changed: how much to take   metFORMIN 500 MG tablet Commonly known as: GLUCOPHAGE Take 1 tablet (500 mg total) by mouth daily with breakfast.   nitroGLYCERIN 0.4 MG SL tablet Commonly known as: NITROSTAT Place 1 tablet (0.4 mg total) under the tongue every 5 (five) minutes as needed for chest pain.   omega-3 fish oil 1000 MG Caps capsule Commonly known as: MAXEPA Take 1 capsule (1,000 mg total) by mouth daily.   omeprazole 20 MG capsule Commonly known as: PRILOSEC Take 1 capsule (20 mg total) by mouth daily.   rosuvastatin 40 MG tablet Commonly known as: CRESTOR Take 1 tablet (40 mg total) by mouth at bedtime.   ticagrelor 90 MG Tabs tablet Commonly known as: BRILINTA Take 1 tablet (90 mg total) by mouth 2 (two) times daily.   vitamin B-12 1000 MCG tablet Commonly known as: CYANOCOBALAMIN Take 1 tablet (1,000 mcg total) by mouth daily.          Outstanding Labs/Studies   BMET in 1 week Lipid panel and LFTS in 6 weeks   Duration of Discharge Encounter   Greater than 30 minutes including physician time.  SignedLeanor Kail, PA 11/26/2020, 9:50 AM  Patient seen and examined and agree with above. Please see today's progress note. Will discharge home with plans for follow-up in 2 weeks. Needs BMET in 1 week. Cardiac rehab.  Gwyndolyn Kaufman, MD

## 2020-11-26 NOTE — Progress Notes (Addendum)
Patient has previously used Free 30-day supply of Brilinta.  Cannot refill Brilinta from [facility].  Discussed with office staff as well as case Freight forwarder.   Patient will go to [facility] office today from hospital to pick up 30-days sample supply .  Will also provide coupon.  Office staff will reach out to patient with Statistician.  Discussed importance of continuation of dual antiplatelet therapy.

## 2020-11-26 NOTE — Progress Notes (Signed)
CARDIAC REHAB PHASE I   PRE:  Rate/Rhythm: 77 SR    BP: sitting 138/84    SaO2:   MODE:  Ambulation: 400 ft   POST:  Rate/Rhythm: 92 SR    BP: sitting 155/69     SaO2:   Tolerated well, groin stable. Ed completed with good reception. Understands importance of Brilinta. Sts her PCP took her off statin therapy. Would like to get back to PCP with [facility]. Will refer to [place], best for virtual given lack of insurance.  [place], ACSM 11/26/2020 9:25 AM

## 2020-11-27 NOTE — Telephone Encounter (Signed)
**Note De-Identified Angelus Hoopes Obfuscation** I called the pt and advised her to call [place] and [place] at [phone] at her earliest convenience to apply for assistance for her Brilinta and explained that if approved she will receive it from them free of charge for the remainder of this year.  I provided her with Dr Hassell Done complete name, our office fax number and our office address to provide to [facility] and [facility] while applying over he phone.  She thanked me for calling her and states that she will call them today to apply.

## 2020-12-03 ENCOUNTER — Other Ambulatory Visit: Payer: Self-pay

## 2020-12-10 ENCOUNTER — Encounter: Payer: Self-pay | Admitting: Physician Assistant

## 2020-12-10 NOTE — Telephone Encounter (Signed)
**Note De-Identified Vernal Rutan Obfuscation** Letter received Faiz Weber fax from [facility] and [place] stating that they have received the pts part of her application for Brilinta but not Dr Hassell Done.  I have printed a provider page from [facility] and [employer] site, completed it , and have emailed to Dr Hassell Done nurse so she can obtain his signature, date it, and so she can fax to [facility] and [place] at the fax number written on the cover letter included.

## 2020-12-10 NOTE — Progress Notes (Signed)
Cardiology Office Note    Date:  12/11/2020   ID:  Virginia [name], DOB 1960-12-06, MRN [id number]  PCP:  Tresa Garter, MD  Cardiologist:  Larae Grooms, MD  Electrophysiologist:  None   Chief Complaint: f/u cath  History of Present Illness:   Virginia [name] is a 60 y.o. female with history of CAD with STEMI s/p overlapping BMS to LCx in 2016, NSTEMI 11/2020 s/p DES to prox LAD, HTN, HLD, DM type 2, and GERD, suspected CKD stage II (prior Cr 1-1.2), GIB 2016 (bleeding polyp clipped) who is seen for post-hospital follow-up. She was recently admitted with CP and low-level troponin elevation. Underwent coronary angiography on 11/25/20 which revealed 95% prox LAD, 95%D2, 50% ostial-prox LAD, 50% prox-mid RCA, 50% distal RCA, continued patency of the prior Cx stents -> s/p DES to de novo prox LAD stenosis, medical therapy for residual disease. 2D Echo 11/25/20 showed EF 60-65%, grade 1 DD. Hospital course notable for abdominal pain thereafter with nonacute CT. Post-cath Cr 1.18 - f/u planned but did not show for labs, recommended to consider ARB as OP. Lipitor changed to Crestor.  She is seen back for followup doing well without any recurrent anginal chest pain. She has been reintroducing activity and feeling good. She really noticed that her endurance is springing back. No SOB, palpitations, syncope. She feels a rare twinge in her chest when she lays a certain way. See below regarding Brilinta.   Labwork independently reviewed: 11/2020 K 4.3, Cr 1.18, CBC wnl, A1C 5.9, LDL 167, trig 103 03/2020 Cr 1.02, LFTs wnl, TSH wnl   Past Medical History:  Diagnosis Date  . Anemia   . CAD (coronary artery disease)    a. 06/2015 BMS x2 to LCx. b. NSTEMI 11/2020 s/p DES to prox LAD; residual disease treated medically.  . CKD (chronic kidney disease), stage II   . Diabetes mellitus ([place])   . GERD (gastroesophageal reflux disease)    ?  Marland Kitchen GI bleeding    2016 - bleeding polyp clipped  .  Hyperlipidemia with target LDL less than 70   . Hypertension   . STEMI (ST elevation myocardial infarction) ([place]) 06/16/2015   BMS x 2 CFX  . Thyroid nodule    benign    Past Surgical History:  Procedure Laterality Date  . ABDOMINAL HYSTERECTOMY N/A 02/02/2016   Procedure: HYSTERECTOMY ABDOMINAL WITH BILATERAL SALPINGECTOMY;  Surgeon: Woodroe Mode, MD;  Location: [facility];  Service: Gynecology;  Laterality: N/A;  . APPENDECTOMY    . CARDIAC CATHETERIZATION N/A 06/17/2015   Procedure: Left Heart Cath and Coronary Angiography;  Surgeon: Jettie Booze, MD;  Location: [facility];  Service: Cardiovascular;  Laterality: N/A;  . CARDIAC CATHETERIZATION  06/17/2015   Procedure: Coronary Stent Intervention;  Surgeon: Jettie Booze, MD;  Location: [place] [facility];  Service: Cardiovascular;;  . CARDIAC CATHETERIZATION N/A 09/18/2015   Procedure: Left Heart Cath and Coronary Angiography;  Surgeon: Troy Sine, MD;  Location: [facility];  Service: Cardiovascular;  Laterality: N/A;  . CESAREAN SECTION     Myomectomy done during cesarean section  . COLONOSCOPY N/A 09/20/2015   Procedure: COLONOSCOPY;  Surgeon: Carol Ada, MD;  Location: [facility];  Service: Endoscopy;  Laterality: N/A;  . COLONOSCOPY    . CORONARY STENT INTERVENTION N/A 11/25/2020   Procedure: CORONARY STENT INTERVENTION;  Surgeon: Sherren Mocha, MD;  Location: [place] [facility];  Service: Cardiovascular;  Laterality: N/A;  .  CORONARY STENT PLACEMENT  11/25/2020  . ESOPHAGOGASTRODUODENOSCOPY N/A 09/20/2015   Procedure: ESOPHAGOGASTRODUODENOSCOPY (EGD);  Surgeon: Carol Ada, MD;  Location: [facility] ENDOSCOPY;  Service: Endoscopy;  Laterality: N/A;  . LEFT HEART CATH AND CORONARY ANGIOGRAPHY N/A 11/25/2020   Procedure: LEFT HEART CATH AND CORONARY ANGIOGRAPHY;  Surgeon: Sherren Mocha, MD;  Location: [place] [facility];  Service: Cardiovascular;  Laterality: N/A;  . olympus  quick clip pro gastric body   10/09/15    Current Medications: Current Meds  Medication Sig  . aspirin EC 81 MG tablet Take 1 tablet (81 mg total) by mouth daily.  . carvedilol (COREG) 6.25 MG tablet Take 1 tablet (6.25 mg total) by mouth 2 (two) times daily with a meal.  . ferrous sulfate (FEROSUL) 325 (65 FE) MG tablet Take 1 tablet (325 mg total) by mouth daily with breakfast.  . isosorbide mononitrate (IMDUR) 30 MG 24 hr tablet Take 1 tablet (30 mg total) by mouth daily.  . metFORMIN (GLUCOPHAGE) 500 MG tablet Take 1 tablet (500 mg total) by mouth daily with breakfast.  . nitroGLYCERIN (NITROSTAT) 0.4 MG SL tablet Place 1 tablet (0.4 mg total) under the tongue every 5 (five) minutes as needed for chest pain.  Marland Kitchen omega-3 fish oil (MAXEPA) 1000 MG CAPS capsule Take 1 capsule (1,000 mg total) by mouth daily.  Marland Kitchen omeprazole (PRILOSEC) 20 MG capsule Take 1 capsule (20 mg total) by mouth daily.  . rosuvastatin (CRESTOR) 40 MG tablet Take 1 tablet (40 mg total) by mouth at bedtime.  . ticagrelor (BRILINTA) 90 MG TABS tablet Take 1 tablet (90 mg total) by mouth 2 (two) times daily.  . vitamin B-12 (CYANOCOBALAMIN) 1000 MCG tablet Take 1 tablet (1,000 mcg total) by mouth daily.      Allergies:   Patient has no known allergies.   Social History   Socioeconomic History  . Marital status: Married    Spouse name: Not on file  . Number of children: Not on file  . Years of education: Not on file  . Highest education level: Not on file  Occupational History  . Not on file  Tobacco Use  . Smoking status: Never Smoker  . Smokeless tobacco: Never Used  Vaping Use  . Vaping Use: Never used  Substance and Sexual Activity  . Alcohol use: No    Alcohol/week: 0.0 standard drinks  . Drug use: No  . Sexual activity: Not Currently    Birth control/protection: Post-menopausal  Other Topics Concern  . Not on file  Social History Narrative  . Not on file   Social Determinants of Health   Financial Resource Strain: Not on  file  Food Insecurity: Not on file  Transportation Needs: Not on file  Physical Activity: Not on file  Stress: Not on file  Social Connections: Not on file     Family History:  The patient's family history includes Asthma in her paternal uncle; Cancer in her father; Diabetes in her mother, paternal aunt, and paternal uncle; Heart attack in her brother and maternal uncle; Heart disease in her mother; Hyperlipidemia in her sister; Hypertension in her brother and sister; Stroke in her mother. There is no history of Thyroid disease.  ROS:   Please see the history of present illness.  All other systems are reviewed and otherwise negative.    EKGs/Labs/Other Studies Reviewed:    Studies reviewed are outlined and summarized above. Reports included below if pertinent.  2D Echo 11/25/20 1. Left ventricular ejection fraction, by  estimation, is 60 to 65%. The  left ventricle has normal function. The left ventricle has no regional  wall motion abnormalities. There is mild concentric left ventricular  hypertrophy. Left ventricular diastolic  parameters are consistent with Grade II diastolic dysfunction  (pseudonormalization).  2. Right ventricular systolic function is normal. The right ventricular  size is normal. There is normal pulmonary artery systolic pressure.  3. The mitral valve is normal in structure. Trivial mitral valve  regurgitation. No evidence of mitral stenosis.  4. The aortic valve is tricuspid. Aortic valve regurgitation is not  visualized. No aortic stenosis is present.  5. The inferior vena cava is normal in size with greater than 50%  respiratory variability, suggesting right atrial pressure of 3 mmHg.  Cath 11/25/20    2nd Diag lesion is 95% stenosed.  Mid LAD to Dist LAD lesion is 20% stenosed.  Non-stenotic Mid Cx to Dist Cx lesion was previously treated.  Ost LAD to Prox LAD lesion is 50% stenosed.  Prox LAD lesion is 95% stenosed.  A drug-eluting  stent was successfully placed using a STENT RESOLUTE ONYX [id number].  Post intervention, there is a 0% residual stenosis.  Prox RCA to Mid RCA lesion is 50% stenosed.  Dist RCA lesion is 50% stenosed.   1.  Severe de novo proximal LAD stenosis, treated successfully with a 2.75 x 15 mm resolute Onyx DES 2.  Continued patency of overlapping bare-metal stents in the left circumflex 3.  Mild to moderate diffuse nonobstructive coronary artery disease involving the ostial/proximal LAD, proximal circumflex, and mid/distal RCA, all appropriate for continued intensive medical therapy  Recommendations: Dual antiplatelet therapy with aspirin and ticagrelor 12 months without interruption (ACS class I recommendation), aggressive medical therapy      EKG:  EKG is ordered today, personally reviewed, demonstrating NSR 69bpm NSST changes  Recent Labs: 04/03/2020: ALT 11; TSH 1.120 11/26/2020: BUN 13; Creatinine, Ser 1.18; Hemoglobin 13.0; Platelets 159; Potassium 4.3; Sodium 139  Recent Lipid Panel    Component Value Date/Time   CHOL 250 (H) 11/25/2020 0316   CHOL 270 (H) 04/03/2020 1134   TRIG 103 11/25/2020 0316   HDL 62 11/25/2020 0316   HDL 60 04/03/2020 1134   CHOLHDL 4.0 11/25/2020 0316   VLDL 21 11/25/2020 0316   LDLCALC 167 (H) 11/25/2020 0316   LDLCALC 188 (H) 04/03/2020 1134    PHYSICAL EXAM:    VS:  BP 132/80   Pulse 69   Ht 5\' 6"  (1.676 m)   Wt 169 lb (76.7 kg)   LMP 12/29/2015   SpO2 99%   BMI 27.28 kg/m   BMI: Body mass index is 27.28 kg/m.  GEN: Well nourished, well developed AAF, in no acute distress HEENT: normocephalic, atraumatic Neck: no JVD, carotid bruits, or masses Cardiac: RRR; no murmurs, rubs, or gallops, no edema  Respiratory:  clear to auscultation bilaterally, normal work of breathing GI: soft, nontender, nondistended, + BS MS: no deformity or atrophy Skin: warm and dry, no rash, right groin cath site without hematoma, ecchymosis, or bruit. Neuro:   Alert and Oriented x 3, Strength and sensation are intact, follows commands Psych: euthymic mood, full affect  Wt Readings from Last 3 Encounters:  12/11/20 169 lb (76.7 kg)  11/26/20 165 lb 2 oz (74.9 kg)  10/06/20 166 lb 12.8 oz (75.7 kg)     ASSESSMENT & PLAN:   1. CAD - She is doing great without any recurrent anginal symptoms. She has rare fleeting "twinge" of  chest discomfort when she lays a certain way. This is a fairly common observation post-cath as patients have new heightened awareness of chest discomfort. She has not had any symptoms concerning for progression or issues with stent. EKG reassuring. Regarding Brilinta, it sounds like she is on track to receive this shortly. See phone note from this week regarding [employer] - per pt, it has been approved, just awaiting Dr. Hassell Done signature that was faxed today. The patient anticipates she should receive it within a few days given her conversation with the [facility] on Tuesday. She reports she has just enough left to get her through tomorrow AM. She will be provided 2 more weeks of samples in the office today to make sure there is no lapse in DAPT. She is aware to call us if there is any delay receiving her Brilinta early next week and understands the importance of not missing doses. Otherwise continue ASA, BB, Imdur, statin.  2. Essential HTN - BP upper limits of normal. Per DC summary, suggested to add ARB post cath if renal function stable. Check BMET today to ensure stability and go from there. Will either provide her with BP cuff if we have one available in the office or request one from social work given limited resources.  3. Hyperlipidemia - continue statin. We'll see her back in follow-up in about 6 weeks to ensure no hiccups with obtaining DAPT. Can obtain f/u liver/lipids at that time - asked her to schedule in such a way that she will be fasting for 6 hours beforehand.  4. Suspected CKD stage II - recheck BMET  today.  Disposition: F/u with myself, APP or Dr. Irish Lack in 6 weeks. After that she can likely go back to yearly follow-up.   Medication Adjustments/Labs and Tests Ordered: Current medicines are reviewed at length with the patient today.  Concerns regarding medicines are outlined above. Medication changes, Labs and Tests ordered today are summarized above and listed in the Patient Instructions accessible in Encounters.   Signed, Charlie Pitter, PA-C  12/11/2020 3:34 PM    [facility] [place], [place], [place]  [address] Phone: [phone]; Fax: [phone]

## 2020-12-11 ENCOUNTER — Other Ambulatory Visit: Payer: Self-pay

## 2020-12-11 ENCOUNTER — Ambulatory Visit (INDEPENDENT_AMBULATORY_CARE_PROVIDER_SITE_OTHER): Payer: Self-pay | Admitting: Physician Assistant

## 2020-12-11 ENCOUNTER — Encounter: Payer: Self-pay | Admitting: Physician Assistant

## 2020-12-11 VITALS — BP 132/80 | HR 69 | Ht 66.0 in | Wt 169.0 lb

## 2020-12-11 DIAGNOSIS — I10 Essential (primary) hypertension: Secondary | ICD-10-CM

## 2020-12-11 DIAGNOSIS — E785 Hyperlipidemia, unspecified: Secondary | ICD-10-CM

## 2020-12-11 DIAGNOSIS — N182 Chronic kidney disease, stage 2 (mild): Secondary | ICD-10-CM

## 2020-12-11 DIAGNOSIS — I2510 Atherosclerotic heart disease of native coronary artery without angina pectoris: Secondary | ICD-10-CM

## 2020-12-11 NOTE — Telephone Encounter (Signed)
Dr. Irish Lack has signed and completed forms. They have been faxed to [facility]. Confirmation received that fax was successful.

## 2020-12-11 NOTE — Patient Instructions (Addendum)
Medication Instructions:  Your physician recommends that you continue on your current medications as directed. Please refer to the Current Medication list given to you today. *If you need a refill on your cardiac medications before your next appointment, please call your pharmacy*   Lab Work: BMET today If you have labs (blood work) drawn today and your tests are completely normal, you will receive your results only by: Marland Kitchen MyChart Message (if you have MyChart) OR . A paper copy in the mail If you have any lab test that is abnormal or we need to change your treatment, we will call you to review the results.   Testing/Procedures: None   Follow-Up: At [facility], you and your health needs are our priority.  As part of our continuing mission to provide you with exceptional heart care, we have created designated Provider Care Teams.  These Care Teams include your primary Cardiologist (physician) and Advanced Practice Providers (APPs -  Physician Assistants and Nurse Practitioners) who all work together to provide you with the care you need, when you need it.  We recommend signing up for the patient portal called "MyChart".  Sign up information is provided on this After Visit Summary.  MyChart is used to connect with patients for Virtual Visits (Telemedicine).  Patients are able to view lab/test results, encounter notes, upcoming appointments, etc.  Non-urgent messages can be sent to your provider as well.   To learn more about what you can do with MyChart, go to [email or web address].    Your next appointment:   6 week(s)  The format for your next appointment:   In Person  Provider:   Casandra Doffing, MD or Melina Copa, PA-C   Other Instructions 1.We will provide you with brilinta samples today. If you do not receive your shipment from the [employer], (brilinta patient assistance) by Monday, please call the company at [phone]  to follow up on this.  2.We have also given you a blood  pressure machine today.

## 2020-12-12 LAB — BASIC METABOLIC PANEL
BUN/Creatinine Ratio: 15 (ref 12–28)
BUN: 14 mg/dL (ref 8–27)
CO2: 24 mmol/L (ref 20–29)
Calcium: 9 mg/dL (ref 8.7–10.3)
Chloride: 105 mmol/L (ref 96–106)
Creatinine, Ser: 0.95 mg/dL (ref 0.57–1.00)
GFR calc Af Amer: 75 mL/min/{1.73_m2} (ref >59)
GFR calc non Af Amer: 65 mL/min/{1.73_m2} (ref >59)
Glucose: 92 mg/dL (ref 65–99)
Potassium: 4.1 mmol/L (ref 3.5–5.2)
Sodium: 141 mmol/L (ref 134–144)

## 2020-12-17 ENCOUNTER — Other Ambulatory Visit: Payer: Self-pay | Admitting: *Deleted

## 2020-12-17 DIAGNOSIS — I10 Essential (primary) hypertension: Secondary | ICD-10-CM

## 2020-12-17 DIAGNOSIS — N182 Chronic kidney disease, stage 2 (mild): Secondary | ICD-10-CM

## 2020-12-17 MED ORDER — LOSARTAN POTASSIUM 25 MG PO TABS: 25.0000 mg | ORAL_TABLET | Freq: Every day | ORAL | 3 refills | Status: DC

## 2020-12-17 MED FILL — LOSARTAN POTASSIUM 25 MG TA: 25 | 30 days supply | Qty: 30 | Fill #0

## 2020-12-17 NOTE — Progress Notes (Signed)
bmet  

## 2020-12-21 ENCOUNTER — Telehealth (HOSPITAL_COMMUNITY): Payer: Self-pay

## 2020-12-21 ENCOUNTER — Encounter (HOSPITAL_COMMUNITY): Payer: Self-pay

## 2020-12-21 NOTE — Telephone Encounter (Signed)
Attempted to contact pt in regards to CR, left message with pt husband Matt to contact CR.  Mailed letter.

## 2020-12-23 MED FILL — ISOSORBIDE MN ER 30 MG TAB: 30 | 30 days supply | Qty: 30 | Fill #0

## 2020-12-23 MED FILL — ROSUVASTATIN CALCIUM 40 MG: 40 | 30 days supply | Qty: 30 | Fill #0

## 2020-12-23 MED FILL — OMEGA-3 ETHYL ESTERS 1 GM C: 1 | 30 days supply | Qty: 30 | Fill #1

## 2020-12-23 MED FILL — CARVEDILOL 6.25 MG TABLET: 6.25 | 30 days supply | Qty: 60 | Fill #0

## 2020-12-24 ENCOUNTER — Other Ambulatory Visit: Payer: Self-pay

## 2020-12-30 ENCOUNTER — Other Ambulatory Visit: Payer: Self-pay | Admitting: *Deleted

## 2020-12-30 ENCOUNTER — Other Ambulatory Visit: Payer: Self-pay

## 2020-12-30 DIAGNOSIS — N182 Chronic kidney disease, stage 2 (mild): Secondary | ICD-10-CM

## 2020-12-30 DIAGNOSIS — I10 Essential (primary) hypertension: Secondary | ICD-10-CM

## 2020-12-30 LAB — BASIC METABOLIC PANEL
BUN/Creatinine Ratio: 12 (ref 12–28)
BUN: 13 mg/dL (ref 8–27)
CO2: 23 mmol/L (ref 20–29)
Calcium: 9.3 mg/dL (ref 8.7–10.3)
Chloride: 106 mmol/L (ref 96–106)
Creatinine, Ser: 1.06 mg/dL — ABNORMAL HIGH (ref 0.57–1.00)
GFR calc Af Amer: 66 mL/min/{1.73_m2} (ref >59)
GFR calc non Af Amer: 57 mL/min/{1.73_m2} — ABNORMAL LOW (ref >59)
Glucose: 95 mg/dL (ref 65–99)
Potassium: 3.9 mmol/L (ref 3.5–5.2)
Sodium: 140 mmol/L (ref 134–144)

## 2021-01-17 NOTE — Progress Notes (Signed)
Cardiology Office Note   Date:  01/18/2021   ID:  Virginia [name], DOB [birth year], MRN [id number]  PCP:  Tresa Garter, MD    No chief complaint on file.  CAD  Wt Readings from Last 3 Encounters:  01/18/21 172 lb (78 kg)  12/11/20 169 lb (76.7 kg)  11/26/20 165 lb 2 oz (74.9 kg)       History of Present Illness: Virginia [name] is a 60 y.o. female  Who had a lateral MI in August 2016. She was treated with 2 overlappig BMS in 2016.  She was cathed again a few months later shwing:  Prox LAD lesion, 60% stenosed.  2nd Diag lesion, 95% stenosed.  The left ventricular systolic function is normal.  Mid LAD to Dist LAD lesion, 20% stenosed.  Normal LV function without residual wall motion abnormality with an ejection fraction of 55-65%.  Two-vessel CAD with 60% eccentric smooth tubular ostial LAD stenosis, 20% mid stenosis in the region of a very small bifurcating diagonal vessel with 95% stenosis in the inferior limb of the small diagonal branch; widely patent tandem bare-metal stents in the AV groove circumflex vessel; normal RCA.  RECOMMENDATION: Increased medical therapy. Nitrates will be added to her regimen of beta blocker, ACE inhibitor and statin. Consider amlodipine if recurrent symptomatology.  She had some vaginal bleeding post MI.   In 2019, she has had more chest pain.  It was atypical.  She had a low risk nuclear study in the hospital.  THere was a question of apical ischemia.  Nothing noted in the lateral wall where she had her MI.  Cath in Jan 2022 showed: " Severe de novo proximal LAD stenosis, treated successfully with a 2.75 x 15 mm resolute Onyx DES 2.  Continued patency of overlapping bare-metal stents in the left circumflex 3.  Mild to moderate diffuse nonobstructive coronary artery disease involving the ostial/proximal LAD, proximal circumflex, and mid/distal RCA, all appropriate for continued intensive medical therapy"  Tried to get  Brilinta through [place] [facility].  She improved lifestyle in 2016 after her stents.  Despite maintaining this, she had another event in 2022.   She feels weaker.    She is back to walking 35-40 minutes a few times a week.   Denies : Chest pain. Dizziness. Leg edema. Nitroglycerin use. Orthopnea. Palpitations. Paroxysmal nocturnal dyspnea. Shortness of breath. Syncope.   Past Medical History:  Diagnosis Date  . Anemia   . CAD (coronary artery disease)    a. 06/2015 BMS x2 to LCx. b. NSTEMI 11/2020 s/p DES to prox LAD; residual disease treated medically.  . CKD (chronic kidney disease), stage II   . Diabetes mellitus ([place])   . GERD (gastroesophageal reflux disease)    ?  Marland Kitchen GI bleeding    2016 - bleeding polyp clipped  . Hyperlipidemia with target LDL less than 70   . Hypertension   . STEMI (ST elevation myocardial infarction) ([place]) 06/16/2015   BMS x 2 CFX  . Thyroid nodule    benign    Past Surgical History:  Procedure Laterality Date  . ABDOMINAL HYSTERECTOMY N/A 02/02/2016   Procedure: HYSTERECTOMY ABDOMINAL WITH BILATERAL SALPINGECTOMY;  Surgeon: Woodroe Mode, MD;  Location: [facility];  Service: Gynecology;  Laterality: N/A;  . APPENDECTOMY    . CARDIAC CATHETERIZATION N/A 06/17/2015   Procedure: Left Heart Cath and Coronary Angiography;  Surgeon: Jettie Booze, MD;  Location: [place] [facility];  Service: Cardiovascular;  Laterality: N/A;  . CARDIAC CATHETERIZATION  06/17/2015   Procedure: Coronary Stent Intervention;  Surgeon: Jettie Booze, MD;  Location: [place] [facility];  Service: Cardiovascular;;  . CARDIAC CATHETERIZATION N/A 09/18/2015   Procedure: Left Heart Cath and Coronary Angiography;  Surgeon: Troy Sine, MD;  Location: [facility] [facility];  Service: Cardiovascular;  Laterality: N/A;  . CESAREAN SECTION     Myomectomy done during cesarean section  . COLONOSCOPY N/A 09/20/2015   Procedure: COLONOSCOPY;  Surgeon: Carol Ada, MD;  Location[facility] ENDOSCOPY;   Service: Endoscopy;  Laterality: N/A;  . COLONOSCOPY    . CORONARY STENT INTERVENTION N/A 11/25/2020   Procedure: CORONARY STENT INTERVENTION;  Surgeon: Sherren Mocha, MD;  Location: [place] [facility];  Service: Cardiovascular;  Laterality: N/A;  . CORONARY STENT PLACEMENT  11/25/2020  . ESOPHAGOGASTRODUODENOSCOPY N/A 09/20/2015   Procedure: ESOPHAGOGASTRODUODENOSCOPY (EGD);  Surgeon: Carol Ada, MD;  Location: [facility] ENDOSCOPY;  Service: Endoscopy;  Laterality: N/A;  . LEFT HEART CATH AND CORONARY ANGIOGRAPHY N/A 11/25/2020   Procedure: LEFT HEART CATH AND CORONARY ANGIOGRAPHY;  Surgeon: Sherren Mocha, MD;  Location: [place] [facility];  Service: Cardiovascular;  Laterality: N/A;  . olympus  quick clip pro gastric body  10/09/15     Current Outpatient Medications  Medication Sig Dispense Refill  . aspirin EC 81 MG tablet Take 1 tablet (81 mg total) by mouth daily. 30 tablet 6  . carvedilol (COREG) 6.25 MG tablet Take 1 tablet (6.25 mg total) by mouth 2 (two) times daily with a meal. 60 tablet 6  . ferrous sulfate (FEROSUL) 325 (65 FE) MG tablet Take 1 tablet (325 mg total) by mouth daily with breakfast. 30 tablet 6  . isosorbide mononitrate (IMDUR) 30 MG 24 hr tablet Take 1 tablet (30 mg total) by mouth daily. 90 tablet 2  . losartan (COZAAR) 25 MG tablet Take 1 tablet (25 mg total) by mouth daily. 90 tablet 3  . metFORMIN (GLUCOPHAGE) 500 MG tablet Take 1 tablet (500 mg total) by mouth daily with breakfast. 90 tablet 1  . nitroGLYCERIN (NITROSTAT) 0.4 MG SL tablet Place 1 tablet (0.4 mg total) under the tongue every 5 (five) minutes as needed for chest pain. 25 tablet 2  . omega-3 fish oil (MAXEPA) 1000 MG CAPS capsule Take 1 capsule (1,000 mg total) by mouth daily. 60 capsule 11  . omeprazole (PRILOSEC) 20 MG capsule Take 1 capsule (20 mg total) by mouth daily. 90 capsule 1  . rosuvastatin (CRESTOR) 40 MG tablet Take 1 tablet (40 mg total) by mouth at bedtime. 30 tablet 6  . ticagrelor  (BRILINTA) 90 MG TABS tablet Take 1 tablet (90 mg total) by mouth 2 (two) times daily. 60 tablet 11  . vitamin B-12 (CYANOCOBALAMIN) 1000 MCG tablet Take 1 tablet (1,000 mcg total) by mouth daily. 90 tablet 1   No current facility-administered medications for this visit.    Allergies:   Patient has no known allergies.    Social History:  The patient  reports that she has never smoked. She has never used smokeless tobacco. She reports that she does not drink alcohol and does not use drugs.   Family History:  The patient's family history includes Asthma in her paternal uncle; Cancer in her father; Diabetes in her mother, paternal aunt, and paternal uncle; Heart attack in her brother and maternal uncle; Heart disease in her mother; Hyperlipidemia in her sister; Hypertension in her brother and sister; Stroke in her mother.  ROS:  Please see the history of present illness.   Otherwise, review of systems are positive for fatigue.   All other systems are reviewed and negative.    PHYSICAL EXAM: VS:  BP 130/70 (BP Location: Left Arm, Patient Position: Sitting, Cuff Size: Normal)   Pulse 87   Ht _0  (1.676 m)   Wt 172 lb (78 kg)   LMP 12/29/2015   SpO2 97%   BMI 27.76 kg/m  , BMI Body mass index is 27.76 kg/m. GEN: Well nourished, well developed, in no acute distress  HEENT: normal  Neck: no JVD, carotid bruits, or masses Cardiac: RRR; no murmurs, rubs, or gallops,no edema  Respiratory:  clear to auscultation bilaterally, normal work of breathing GI: soft, nontender, nondistended, + BS MS: no deformity or atrophy  Skin: warm and dry, no rash Neuro:  Strength and sensation are intact Psych: euthymic mood, full affect   Recent Labs: 04/03/2020: ALT 11; TSH 1.120 11/26/2020: Hemoglobin 13.0; Platelets 159 12/30/2020: BUN 13; Creatinine, Ser 1.06; Potassium 3.9; Sodium 140   Lipid Panel    Component Value Date/Time   CHOL 250 (H) 11/25/2020 0316   CHOL 270 (H) 04/03/2020 1134    TRIG 103 11/25/2020 0316   HDL 62 11/25/2020 0316   HDL 60 04/03/2020 1134   CHOLHDL 4.0 11/25/2020 0316   VLDL 21 11/25/2020 0316   LDLCALC 167 (H) 11/25/2020 0316   LDLCALC 188 (H) 04/03/2020 1134     Other studies Reviewed: Additional studies/ records that were reviewed today with results demonstrating: Labs reviewed.   ASSESSMENT AND PLAN:  1.   CAD: No angina. Continue aggressive secondary prevention.  No rehab due to insurance.  Continue with regular walking and healthy diet.  She avoids processed foods.  Plant-based diet recommended. 2.   HTN: The current medical regimen is effective;  continue present plan and medications. 3.   Hyperlipidemia: LDL above target of the time of her stent.  Check lipids and liver tests in April. 4.  Fatigue: check TSH with her labs in 4/22.    Current medicines are reviewed at length with the patient today.  The patient concerns regarding her medicines were addressed.  The following changes have been made:  No change  Labs/ tests ordered today include:   Orders Placed This Encounter  Procedures  . Lipid Profile  . Comp Met (CMET)    Recommend 150 minutes/week of aerobic exercise Low fat, low carb, high fiber diet recommended  Disposition:   FU in 6 months   Signed, Larae Grooms, MD  01/18/2021 4:30 PM    [facility] [place], [place], [place]  [address] Phone: [phone]; Fax: [phone]

## 2021-01-18 ENCOUNTER — Encounter: Payer: Self-pay | Admitting: Interventional Cardiology

## 2021-01-18 ENCOUNTER — Ambulatory Visit (INDEPENDENT_AMBULATORY_CARE_PROVIDER_SITE_OTHER): Payer: Self-pay | Admitting: Interventional Cardiology

## 2021-01-18 ENCOUNTER — Other Ambulatory Visit: Payer: Self-pay

## 2021-01-18 ENCOUNTER — Telehealth (HOSPITAL_COMMUNITY): Payer: Self-pay

## 2021-01-18 VITALS — BP 130/70 | HR 87 | Ht 66.0 in | Wt 172.0 lb

## 2021-01-18 DIAGNOSIS — I2510 Atherosclerotic heart disease of native coronary artery without angina pectoris: Secondary | ICD-10-CM

## 2021-01-18 DIAGNOSIS — N182 Chronic kidney disease, stage 2 (mild): Secondary | ICD-10-CM

## 2021-01-18 DIAGNOSIS — I10 Essential (primary) hypertension: Secondary | ICD-10-CM

## 2021-01-18 DIAGNOSIS — E785 Hyperlipidemia, unspecified: Secondary | ICD-10-CM

## 2021-01-18 DIAGNOSIS — R5383 Other fatigue: Secondary | ICD-10-CM

## 2021-01-18 NOTE — Patient Instructions (Signed)
Medication Instructions:  Your physician recommends that you continue on your current medications as directed. Please refer to the Current Medication list given to you today.  *If you need a refill on your cardiac medications before your next appointment, please call your pharmacy*   Lab Work: Your physician recommends that you return for lab work on April 11,2022.  Lipid profile and CMET.  This will be fasting.  The lab opens at 7:30 AM  If you have labs (blood work) drawn today and your tests are completely normal, you will receive your results only by: Marland Kitchen MyChart Message (if you have MyChart) OR . A paper copy in the mail If you have any lab test that is abnormal or we need to change your treatment, we will call you to review the results.   Testing/Procedures: none   Follow-Up: At [facility], you and your health needs are our priority.  As part of our continuing mission to provide you with exceptional heart care, we have created designated Provider Care Teams.  These Care Teams include your primary Cardiologist (physician) and Advanced Practice Providers (APPs -  Physician Assistants and Nurse Practitioners) who all work together to provide you with the care you need, when you need it.  We recommend signing up for the patient portal called "MyChart".  Sign up information is provided on this After Visit Summary.  MyChart is used to connect with patients for Virtual Visits (Telemedicine).  Patients are able to view lab/test results, encounter notes, upcoming appointments, etc.  Non-urgent messages can be sent to your provider as well.   To learn more about what you can do with MyChart, go to [email or web address].    Your next appointment:   September 26,2022 at 10:00    The format for your next appointment:   In Person  Provider:   Casandra Doffing, MD   Other Instructions   High-Fiber Eating Plan Fiber, also called dietary fiber, is a type of carbohydrate. It is found  foods such as fruits, vegetables, whole grains, and beans. A high-fiber diet can have many health benefits. Your health care provider may recommend a high-fiber diet to help:  Prevent constipation. Fiber can make your bowel movements more regular.  Lower your cholesterol.  Relieve the following conditions: ? Inflammation of veins in the anus (hemorrhoids). ? Inflammation of specific areas of the digestive tract (uncomplicated diverticulosis). ? A problem of the large intestine, also called the colon, that sometimes causes pain and diarrhea (irritable bowel syndrome, or IBS).  Prevent overeating as part of a weight-loss plan.  Prevent heart disease, type 2 diabetes, and certain cancers. What are tips for following this plan? Reading food labels  Check the nutrition facts label on food products for the amount of dietary fiber. Choose foods that have 5 grams of fiber or more per serving.  The goals for recommended daily fiber intake include: ? Men (age 28 or younger): 34-38 g. ? Men (over age 84): 28-34 g. ? Women (age 62 or younger): 25-28 g. ? Women (over age 58): 22-25 g. Your daily fiber goal is _____________ g.   Shopping  Choose whole fruits and vegetables instead of processed forms, such as apple juice or applesauce.  Choose a wide variety of high-fiber foods such as avocados, lentils, oats, and kidney beans.  Read the nutrition facts label of the foods you choose. Be aware of foods with added fiber. These foods often have high sugar and sodium amounts per serving. Cooking  Use  whole-grain flour for baking and cooking.  Cook with brown rice instead of white rice. Meal planning  Start the day with a breakfast that is high in fiber, such as a cereal that contains 5 g of fiber or more per serving.  Eat breads and cereals that are made with whole-grain flour instead of refined flour or white flour.  Eat brown rice, bulgur wheat, or millet instead of white rice.  Use beans  in place of meat in soups, salads, and pasta dishes.  Be sure that half of the grains you eat each day are whole grains. General information  You can get the recommended daily intake of dietary fiber by: ? Eating a variety of fruits, vegetables, grains, nuts, and beans. ? Taking a fiber supplement if you are not able to take in enough fiber in your diet. It is better to get fiber through food than from a supplement.  Gradually increase how much fiber you consume. If you increase your intake of dietary fiber too quickly, you may have bloating, cramping, or gas.  Drink plenty of water to help you digest fiber.  Choose high-fiber snacks, such as berries, raw vegetables, nuts, and popcorn. What foods should I eat? Fruits Berries. Pears. Apples. Oranges. Avocado. Prunes and raisins. Dried figs. Vegetables Sweet potatoes. Spinach. Kale. Artichokes. Cabbage. Broccoli. Cauliflower. Green peas. Carrots. Squash. Grains Whole-grain breads. Multigrain cereal. Oats and oatmeal. Brown rice. Barley. Bulgur wheat. [place]. Quinoa. Bran muffins. Popcorn. Rye wafer crackers. Meats and other proteins Navy beans, kidney beans, and pinto beans. Soybeans. Split peas. Lentils. Nuts and seeds. Dairy Fiber-fortified yogurt. Beverages Fiber-fortified soy milk. Fiber-fortified orange juice. Other foods Fiber bars. The items listed above may not be a complete list of recommended foods and beverages. Contact a dietitian for more information. What foods should I avoid? Fruits Fruit juice. Cooked, strained fruit. Vegetables Fried potatoes. Canned vegetables. Well-cooked vegetables. Grains White bread. Pasta made with refined flour. White rice. Meats and other proteins Fatty cuts of meat. Fried chicken or fried fish. Dairy Milk. Yogurt. Cream cheese. Sour cream. Fats and oils Butters. Beverages Soft drinks. Other foods Cakes and pastries. The items listed above may not be a complete list of foods and  beverages to avoid. Talk with your dietitian about what choices are best for you. Summary  Fiber is a type of carbohydrate. It is found in foods such as fruits, vegetables, whole grains, and beans.  A high-fiber diet has many benefits. It can help to prevent constipation, lower blood cholesterol, aid weight loss, and reduce your risk of heart disease, diabetes, and certain cancers.  Increase your intake of fiber gradually. Increasing fiber too quickly may cause cramping, bloating, and gas. Drink plenty of water while you increase the amount of fiber you consume.  The best sources of fiber include whole fruits and vegetables, whole grains, nuts, seeds, and beans. This information is not intended to replace advice given to you by your health care provider. Make sure you discuss any questions you have with your health care provider. Document Revised: 02/27/2020 Document Reviewed: 02/27/2020 Elsevier Patient Education  2021 [employer].

## 2021-01-18 NOTE — Telephone Encounter (Signed)
No response from pt.  Closed referral  

## 2021-01-18 NOTE — Addendum Note (Signed)
Addended by: Thompson Grayer on: 01/18/2021 04:41 PM   Modules accepted: Orders

## 2021-02-08 ENCOUNTER — Other Ambulatory Visit: Payer: Self-pay

## 2021-02-08 MED FILL — Rosuvastatin Calcium Tab 40 MG: ORAL | 30 days supply | Qty: 30 | Fill #0 | Status: AC

## 2021-02-08 MED FILL — Losartan Potassium Tab 25 MG: ORAL | 30 days supply | Qty: 30 | Fill #0 | Status: AC

## 2021-02-08 MED FILL — Isosorbide Mononitrate Tab ER 24HR 30 MG: ORAL | 30 days supply | Qty: 30 | Fill #0 | Status: AC

## 2021-02-08 MED FILL — Carvedilol Tab 6.25 MG: ORAL | 30 days supply | Qty: 60 | Fill #0 | Status: AC

## 2021-02-08 MED FILL — Omega-3-acid Ethyl Esters Cap 1 GM: ORAL | 30 days supply | Qty: 30 | Fill #0 | Status: AC

## 2021-02-15 ENCOUNTER — Other Ambulatory Visit: Payer: Self-pay

## 2021-02-15 ENCOUNTER — Other Ambulatory Visit: Payer: Self-pay | Admitting: *Deleted

## 2021-02-15 DIAGNOSIS — E785 Hyperlipidemia, unspecified: Secondary | ICD-10-CM

## 2021-02-15 DIAGNOSIS — I10 Essential (primary) hypertension: Secondary | ICD-10-CM

## 2021-02-15 DIAGNOSIS — I2510 Atherosclerotic heart disease of native coronary artery without angina pectoris: Secondary | ICD-10-CM

## 2021-02-15 DIAGNOSIS — R5383 Other fatigue: Secondary | ICD-10-CM

## 2021-02-15 DIAGNOSIS — N182 Chronic kidney disease, stage 2 (mild): Secondary | ICD-10-CM

## 2021-02-15 LAB — COMPREHENSIVE METABOLIC PANEL
ALT: 12 IU/L (ref 0–32)
AST: 13 IU/L (ref 0–40)
Albumin/Globulin Ratio: 1.8 (ref 1.2–2.2)
Albumin: 4.4 g/dL (ref 3.8–4.9)
Alkaline Phosphatase: 114 IU/L (ref 44–121)
BUN/Creatinine Ratio: 10 — ABNORMAL LOW (ref 12–28)
BUN: 10 mg/dL (ref 8–27)
Bilirubin Total: 0.3 mg/dL (ref 0.0–1.2)
CO2: 23 mmol/L (ref 20–29)
Calcium: 9.2 mg/dL (ref 8.7–10.3)
Chloride: 103 mmol/L (ref 96–106)
Creatinine, Ser: 0.97 mg/dL (ref 0.57–1.00)
Globulin, Total: 2.5 g/dL (ref 1.5–4.5)
Glucose: 87 mg/dL (ref 65–99)
Potassium: 4.3 mmol/L (ref 3.5–5.2)
Sodium: 140 mmol/L (ref 134–144)
Total Protein: 6.9 g/dL (ref 6.0–8.5)
eGFR: 67 mL/min/{1.73_m2} (ref >59)

## 2021-02-15 LAB — LIPID PANEL
Chol/HDL Ratio: 2.6 ratio (ref 0.0–4.4)
Cholesterol, Total: 169 mg/dL (ref 100–199)
HDL: 65 mg/dL (ref >39)
LDL Chol Calc (NIH): 93 mg/dL (ref 0–99)
Triglycerides: 53 mg/dL (ref 0–149)
VLDL Cholesterol Cal: 11 mg/dL (ref 5–40)

## 2021-02-15 LAB — TSH: TSH: 1.33 u[IU]/mL (ref 0.450–4.500)

## 2021-02-18 ENCOUNTER — Encounter: Payer: Self-pay | Admitting: *Deleted

## 2021-04-06 ENCOUNTER — Telehealth: Payer: Self-pay

## 2021-04-06 ENCOUNTER — Ambulatory Visit: Payer: Self-pay | Admitting: Family Medicine

## 2021-04-06 NOTE — Telephone Encounter (Signed)
Med refill  Both BP medicines

## 2021-04-07 NOTE — Telephone Encounter (Signed)
Cardiology is refilling BP meds, attempted to contact patient no answer.

## 2021-04-08 NOTE — Telephone Encounter (Signed)
Attempted to contact patient, unable to leave message.

## 2021-04-09 ENCOUNTER — Other Ambulatory Visit: Payer: Self-pay | Admitting: Nurse Practitioner

## 2021-04-09 ENCOUNTER — Other Ambulatory Visit: Payer: Self-pay

## 2021-04-09 DIAGNOSIS — E119 Type 2 diabetes mellitus without complications: Secondary | ICD-10-CM

## 2021-04-09 DIAGNOSIS — K219 Gastro-esophageal reflux disease without esophagitis: Secondary | ICD-10-CM

## 2021-04-09 DIAGNOSIS — I10 Essential (primary) hypertension: Secondary | ICD-10-CM

## 2021-04-09 MED ORDER — OMEPRAZOLE 20 MG PO CPDR
20.0000 mg | DELAYED_RELEASE_CAPSULE | Freq: Every day | ORAL | 1 refills | Status: DC
  Filled 2021-04-09: qty 90, 90d supply, fill #0

## 2021-04-09 MED ORDER — NITROGLYCERIN 0.4 MG SL SUBL
0.4000 mg | SUBLINGUAL_TABLET | SUBLINGUAL | 2 refills | Status: DC | PRN
  Filled 2021-04-09: qty 25, 1d supply, fill #0

## 2021-04-09 MED ORDER — METFORMIN HCL 500 MG PO TABS
500.0000 mg | ORAL_TABLET | Freq: Every day | ORAL | 1 refills | Status: DC
  Filled 2021-04-09: qty 30, 30d supply, fill #0
  Filled 2021-06-22: qty 30, 30d supply, fill #1

## 2021-04-09 MED ORDER — FERROUS SULFATE 325 (65 FE) MG PO TABS
325.0000 mg | ORAL_TABLET | Freq: Every day | ORAL | 6 refills | Status: DC
  Filled 2021-04-09: qty 30, 30d supply, fill #0
  Filled 2021-06-22: qty 30, 30d supply, fill #1
  Filled 2021-08-26: qty 30, 30d supply, fill #2

## 2021-04-09 MED FILL — Rosuvastatin Calcium Tab 40 MG: ORAL | 30 days supply | Qty: 30 | Fill #1 | Status: AC

## 2021-04-09 MED FILL — Carvedilol Tab 6.25 MG: ORAL | 30 days supply | Qty: 60 | Fill #1 | Status: AC

## 2021-04-09 MED FILL — Isosorbide Mononitrate Tab ER 24HR 30 MG: ORAL | 30 days supply | Qty: 30 | Fill #1 | Status: AC

## 2021-04-09 MED FILL — Omega-3-acid Ethyl Esters Cap 1 GM: ORAL | 30 days supply | Qty: 30 | Fill #1 | Status: AC

## 2021-04-23 ENCOUNTER — Emergency Department (HOSPITAL_COMMUNITY): Payer: No Typology Code available for payment source

## 2021-04-23 ENCOUNTER — Emergency Department (HOSPITAL_COMMUNITY)
Admission: EM | Admit: 2021-04-23 | Discharge: 2021-04-23 | Disposition: A | Discharge status: Home / Self Care | Payer: No Typology Code available for payment source | Attending: Emergency Medicine | Admitting: Emergency Medicine

## 2021-04-23 ENCOUNTER — Other Ambulatory Visit: Payer: Self-pay

## 2021-04-23 ENCOUNTER — Encounter (HOSPITAL_COMMUNITY): Payer: Self-pay

## 2021-04-23 DIAGNOSIS — M19011 Primary osteoarthritis, right shoulder: Secondary | ICD-10-CM | POA: Diagnosis not present

## 2021-04-23 DIAGNOSIS — Z7982 Long term (current) use of aspirin: Secondary | ICD-10-CM | POA: Diagnosis not present

## 2021-04-23 DIAGNOSIS — E1140 Type 2 diabetes mellitus with diabetic neuropathy, unspecified: Secondary | ICD-10-CM | POA: Diagnosis not present

## 2021-04-23 DIAGNOSIS — Z7984 Long term (current) use of oral hypoglycemic drugs: Secondary | ICD-10-CM | POA: Insufficient documentation

## 2021-04-23 DIAGNOSIS — E1122 Type 2 diabetes mellitus with diabetic chronic kidney disease: Secondary | ICD-10-CM | POA: Insufficient documentation

## 2021-04-23 DIAGNOSIS — I129 Hypertensive chronic kidney disease with stage 1 through stage 4 chronic kidney disease, or unspecified chronic kidney disease: Secondary | ICD-10-CM | POA: Insufficient documentation

## 2021-04-23 DIAGNOSIS — I2510 Atherosclerotic heart disease of native coronary artery without angina pectoris: Secondary | ICD-10-CM | POA: Insufficient documentation

## 2021-04-23 DIAGNOSIS — N182 Chronic kidney disease, stage 2 (mild): Secondary | ICD-10-CM | POA: Diagnosis not present

## 2021-04-23 DIAGNOSIS — R519 Headache, unspecified: Secondary | ICD-10-CM | POA: Diagnosis not present

## 2021-04-23 DIAGNOSIS — S46911A Strain of unspecified muscle, fascia and tendon at shoulder and upper arm level, right arm, initial encounter: Secondary | ICD-10-CM | POA: Diagnosis not present

## 2021-04-23 DIAGNOSIS — S4991XA Unspecified injury of right shoulder and upper arm, initial encounter: Secondary | ICD-10-CM | POA: Diagnosis present

## 2021-04-23 DIAGNOSIS — Y92410 Unspecified street and highway as the place of occurrence of the external cause: Secondary | ICD-10-CM | POA: Diagnosis not present

## 2021-04-23 DIAGNOSIS — Z79899 Other long term (current) drug therapy: Secondary | ICD-10-CM | POA: Insufficient documentation

## 2021-04-23 DIAGNOSIS — T148XXA Other injury of unspecified body region, initial encounter: Secondary | ICD-10-CM

## 2021-04-23 MED ORDER — ACETAMINOPHEN 325 MG PO TABS
650.0000 mg | ORAL_TABLET | Freq: Once | ORAL | Status: AC
  Administered 2021-04-23: 14:00:00 650 mg via ORAL
  Filled 2021-04-23: qty 2
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
## 2021-04-27 ENCOUNTER — Other Ambulatory Visit: Payer: Self-pay

## 2021-04-27 ENCOUNTER — Ambulatory Visit (INDEPENDENT_AMBULATORY_CARE_PROVIDER_SITE_OTHER): Payer: Self-pay | Admitting: Family Medicine

## 2021-04-27 VITALS — BP 147/92 | HR 80 | Temp 97.4°F | Ht 66.0 in | Wt 172.0 lb

## 2021-04-27 DIAGNOSIS — M542 Cervicalgia: Secondary | ICD-10-CM

## 2021-04-27 DIAGNOSIS — R82998 Other abnormal findings in urine: Secondary | ICD-10-CM

## 2021-04-27 DIAGNOSIS — M62838 Other muscle spasm: Secondary | ICD-10-CM

## 2021-04-27 DIAGNOSIS — M25511 Pain in right shoulder: Secondary | ICD-10-CM

## 2021-04-27 DIAGNOSIS — I10 Essential (primary) hypertension: Secondary | ICD-10-CM

## 2021-04-27 DIAGNOSIS — E119 Type 2 diabetes mellitus without complications: Secondary | ICD-10-CM

## 2021-04-27 LAB — POCT URINALYSIS DIPSTICK
Bilirubin, UA: NEGATIVE
Glucose, UA: NEGATIVE
Ketones, UA: NEGATIVE
Nitrite, UA: NEGATIVE
Protein, UA: NEGATIVE
Spec Grav, UA: 1.025 (ref 1.010–1.025)
Urobilinogen, UA: 0.2 E.U./dL
pH, UA: 7 (ref 5.0–8.0)

## 2021-04-27 LAB — POCT GLYCOSYLATED HEMOGLOBIN (HGB A1C)
HbA1c POC (<> result, manual entry): 6 % (ref 4.0–5.6)
HbA1c, POC (controlled diabetic range): 6 % (ref 0.0–7.0)
HbA1c, POC (prediabetic range): 6 % (ref 5.7–6.4)
Hemoglobin A1C: 6 % — AB (ref 4.0–5.6)

## 2021-04-27 LAB — GLUCOSE, POCT (MANUAL RESULT ENTRY): POC Glucose: 108 mg/dl — AB (ref 70–99)

## 2021-04-27 MED ORDER — CYCLOBENZAPRINE HCL 5 MG PO TABS
5.0000 mg | ORAL_TABLET | Freq: Every day | ORAL | 0 refills | Status: DC
  Filled 2021-04-27: qty 30, 30d supply, fill #0

## 2021-04-27 MED ORDER — ACETAMINOPHEN 500 MG PO TABS
500.0000 mg | ORAL_TABLET | Freq: Four times a day (QID) | ORAL | 0 refills | Status: AC | PRN
Start: 2021-04-27 — End: ?
  Filled 2021-04-27: qty 30, 8d supply, fill #0

## 2021-04-27 MED ORDER — KETOROLAC TROMETHAMINE 30 MG/ML IJ SOLN
30.0000 mg | Freq: Once | INTRAMUSCULAR | Status: AC
  Administered 2021-04-27: 30 mg via INTRAMUSCULAR
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
## 2021-04-29 ENCOUNTER — Other Ambulatory Visit: Payer: Self-pay | Admitting: Family Medicine

## 2021-04-29 ENCOUNTER — Other Ambulatory Visit: Payer: Self-pay

## 2021-04-29 DIAGNOSIS — M542 Cervicalgia: Secondary | ICD-10-CM

## 2021-04-29 LAB — URINE CULTURE

## 2021-04-29 MED ORDER — MELOXICAM 7.5 MG PO TABS
7.5000 mg | ORAL_TABLET | Freq: Every day | ORAL | 0 refills | Status: DC
Start: 2021-04-29 — End: 2021-08-03
  Filled 2021-04-29: qty 30, 30d supply, fill #0

## 2021-04-29 MED FILL — Losartan Potassium Tab 25 MG: ORAL | 30 days supply | Qty: 30 | Fill #1 | Status: AC

## 2021-04-29 NOTE — Progress Notes (Signed)
Meds ordered this encounter  Medications   meloxicam (MOBIC) 7.5 MG tablet    Sig: Take 1 tablet (7.5 mg total) by mouth daily.    Dispense:  30 tablet    Refill:  0    Order Specific Question:   Supervising Provider    Answer:   Tresa Garter [[id number]]     Donia Pounds  APRN, MSN, FNP-C [facility] [address] [place], [place] [address] [phone]

## 2021-05-04 ENCOUNTER — Encounter: Payer: Self-pay | Admitting: Family Medicine

## 2021-05-04 ENCOUNTER — Other Ambulatory Visit: Payer: Self-pay

## 2021-05-04 ENCOUNTER — Ambulatory Visit (INDEPENDENT_AMBULATORY_CARE_PROVIDER_SITE_OTHER): Payer: Self-pay | Admitting: Family Medicine

## 2021-05-04 DIAGNOSIS — M25511 Pain in right shoulder: Secondary | ICD-10-CM

## 2021-05-04 DIAGNOSIS — M542 Cervicalgia: Secondary | ICD-10-CM

## 2021-05-04 MED ORDER — BACLOFEN 10 MG PO TABS
5.0000 mg | ORAL_TABLET | Freq: Three times a day (TID) | ORAL | 3 refills | Status: DC | PRN
  Filled 2021-05-04 – 2021-06-22 (×2): qty 30, 10d supply, fill #0

## 2021-05-04 NOTE — Progress Notes (Signed)
C3-4
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
## 2021-05-12 ENCOUNTER — Other Ambulatory Visit: Payer: Self-pay

## 2021-05-22 ENCOUNTER — Ambulatory Visit
Admission: RE | Admit: 2021-05-22 | Discharge: 2021-05-22 | Disposition: A | Discharge status: Home / Self Care | Payer: Self-pay | Source: Ambulatory Visit | Attending: Family Medicine | Admitting: Family Medicine

## 2021-05-22 ENCOUNTER — Other Ambulatory Visit: Payer: Self-pay

## 2021-05-22 DIAGNOSIS — M25511 Pain in right shoulder: Secondary | ICD-10-CM

## 2021-05-24 ENCOUNTER — Telehealth: Payer: Self-pay | Admitting: Family Medicine

## 2021-05-24 NOTE — Telephone Encounter (Signed)
I called and advised the patient of the results. She would like to try the cortisone injection, as the shoulder hurts all the time. She is having to take Tylenol around the clock. Appointment scheduled for 05/26/21 at 10:20 with Dr. Junius Roads.

## 2021-05-24 NOTE — Telephone Encounter (Signed)
Shoulder MRI shows a high-grade partial tear of the rotator cuff, along with bursitis and tendonitis.  I would suggest proceeding with physical therapy if pain is tolerable.  If still in severe pain, could try a cortisone injection.  If symptoms still don't improve with the above options, then surgical consult.

## 2021-05-26 ENCOUNTER — Ambulatory Visit: Payer: Self-pay | Admitting: Family Medicine

## 2021-05-26 ENCOUNTER — Ambulatory Visit: Payer: Self-pay

## 2021-05-26 ENCOUNTER — Other Ambulatory Visit: Payer: Self-pay

## 2021-05-26 DIAGNOSIS — M25511 Pain in right shoulder: Secondary | ICD-10-CM

## 2021-05-26 NOTE — Progress Notes (Signed)
Office Visit Note   Patient: Virginia [name]           Date of Birth: 09/28/1961           MRN: [id number] Visit Date: 05/26/2021 Requested by: Dorena Dew, FNP [address],  [place] [address] PCP: Dorena Dew, FNP  Subjective: Chief Complaint  Patient presents with   Right Shoulder - Follow-up, Pain    Planned cortisone injection. Been wearing sling. She said that without the sling and the arm hangs down, she has excruciating pain shooting up into the right side of the neck and back around the scapula.    HPI: She is here for follow-up right shoulder pain.  MRI scan showed a high-grade partial rotator cuff tear, articular surface.  She is in a lot of pain, interested in trying an injection prior to physical therapy.                ROS:   All other systems were reviewed and are negative.  Objective: Vital Signs: LMP 12/29/2015   Physical Exam:  General:  Alert and oriented, in no acute distress. Pulm:  Breathing unlabored. Psy:  Normal mood, congruent affect.  Right shoulder: She has slightly better active range of motion compared to last visit, but still limited.  Imaging: US Guided Needle Placement  Result Date: 05/26/2021 Ultrasound guided injection is preferred based studies that show increased duration, increased effect, greater accuracy, decreased procedural pain, increased response rate, and decreased cost with ultrasound guided versus blind injection.   Verbal informed consent obtained.  Time-out conducted.  Noted no overlying erythema, induration, or other signs of local infection. Ultrasound-guided right glenohumeral injection: After sterile prep with Betadine, injected 4 cc 0.25% bupivocaine without epinephrine and 6 mg betamethasone using a 22-gauge spinal needle, passing the needle from posterior approach into the glenohumeral joint.  Dictate seen filling the joint capsule.  Improved range of motion during the anesthetic phase.     Assessment & Plan: Right shoulder partial rotator cuff tear -Glenohumeral injection today.  Physical therapy.  If no improvement, then surgical consult.     Procedures: No procedures performed        PMFS History: Patient Active Problem List   Diagnosis Date Noted   NSTEMI (non-ST elevated myocardial infarction) ([employer]) 11/25/2020   Unstable angina ([facility]) 11/24/2020   Hemoglobin A1c less than 7.0% 09/29/2019   Recent weight loss 09/29/2019   Acute otitis media 03/26/2019   Neuropathy 03/26/2019   CAD (coronary artery disease) 12/26/2017   Old MI (myocardial infarction) 12/26/2017   Gastroesophageal reflux disease without esophagitis 03/16/2017   Blood pressure check 12/28/2016   Bradycardia 12/14/2016   Controlled type 2 diabetes mellitus without complication, without long-term current use of insulin ([place]) 03/29/2016   Numbness of anterior thigh 03/29/2016   Menopausal hot flushes 03/10/2016   Essential hypertension 12/29/2015   Fibroid uterus 11/13/2015   Transient right leg weakness 10/05/2015   Acute GI bleeding    Symptomatic anemia 09/19/2015   Vasovagal near syncope 09/19/2015   Dysfunctional uterine bleeding 09/19/2015   Diarrhea due to drug 09/19/2015   Heme positive stool 09/19/2015   Chest pain 09/17/2015   Multinodular goiter 07/09/2015   CAD S/P CFX BMS (? med complinace) x 2 06/16/15 07/01/2015   Acute renal insufficiency 07/01/2015   Abdominal pain 07/01/2015   Nodular goiter 06/23/2015   Diabetes mellitus type 2, controlled ([facility]) 06/23/2015   STEMI -06/16/15 06/17/2015  Hyperlipidemia with target LDL less than 70    Past Medical History:  Diagnosis Date   Anemia    CAD (coronary artery disease)    a. 06/2015 BMS x2 to LCx. b. NSTEMI 11/2020 s/p DES to prox LAD; residual disease treated medically.   CKD (chronic kidney disease), stage II    Diabetes mellitus (HCC)    GERD (gastroesophageal reflux disease)    ?   GI bleeding    2016 - bleeding polyp  clipped   Hyperlipidemia with target LDL less than 70    Hypertension    STEMI (ST elevation myocardial infarction) ([place]) 06/16/2015   BMS x 2 CFX   Thyroid nodule    benign    Family History  Problem Relation Age of Onset   Stroke Mother    Heart disease Mother    Diabetes Mother    Cancer Father    Hypertension Sister    Hyperlipidemia Sister    Heart attack Brother    Hypertension Brother    Heart attack Maternal Uncle    Asthma Paternal Uncle    Diabetes Paternal Uncle    Diabetes Paternal Aunt    Thyroid disease Neg Hx     Past Surgical History:  Procedure Laterality Date   ABDOMINAL HYSTERECTOMY N/A 02/02/2016   Procedure: HYSTERECTOMY ABDOMINAL WITH BILATERAL SALPINGECTOMY;  Surgeon: Woodroe Mode, MD;  Location: [facility];  Service: Gynecology;  Laterality: N/A;   APPENDECTOMY     CARDIAC CATHETERIZATION N/A 06/17/2015   Procedure: Left Heart Cath and Coronary Angiography;  Surgeon: Jettie Booze, MD;  Location: [facility];  Service: Cardiovascular;  Laterality: N/A;   CARDIAC CATHETERIZATION  06/17/2015   Procedure: Coronary Stent Intervention;  Surgeon: Jettie Booze, MD;  Location: [facility];  Service: Cardiovascular;;   CARDIAC CATHETERIZATION N/A 09/18/2015   Procedure: Left Heart Cath and Coronary Angiography;  Surgeon: Troy Sine, MD;  Location: [facility];  Service: Cardiovascular;  Laterality: N/A;   CESAREAN SECTION     Myomectomy done during cesarean section   COLONOSCOPY N/A 09/20/2015   Procedure: COLONOSCOPY;  Surgeon: Carol Ada, MD;  Location: [facility];  Service: Endoscopy;  Laterality: N/A;   COLONOSCOPY     CORONARY STENT INTERVENTION N/A 11/25/2020   Procedure: CORONARY STENT INTERVENTION;  Surgeon: Sherren Mocha, MD;  Location: [facility];  Service: Cardiovascular;  Laterality: N/A;   CORONARY STENT PLACEMENT  11/25/2020   ESOPHAGOGASTRODUODENOSCOPY N/A 09/20/2015   Procedure:  ESOPHAGOGASTRODUODENOSCOPY (EGD);  Surgeon: Carol Ada, MD;  [facility];  Service: Endoscopy;  Laterality: N/A;   LEFT HEART CATH AND CORONARY ANGIOGRAPHY N/A 11/25/2020   Procedure: LEFT HEART CATH AND CORONARY ANGIOGRAPHY;  Surgeon: Sherren Mocha, MD;  Location: [facility];  Service: Cardiovascular;  Laterality: N/A;   olympus  quick clip pro gastric body  10/09/15   Social History   Occupational History   Not on file  Tobacco Use   Smoking status: Never   Smokeless tobacco: Never  Vaping Use   Vaping Use: Never used  Substance and Sexual Activity   Alcohol use: No    Alcohol/week: 0.0 standard drinks   Drug use: No   Sexual activity: Not Currently    Birth control/protection: Post-menopausal

## 2021-05-27 ENCOUNTER — Other Ambulatory Visit (HOSPITAL_COMMUNITY): Payer: Self-pay

## 2021-06-02 ENCOUNTER — Other Ambulatory Visit: Payer: Self-pay

## 2021-06-09 ENCOUNTER — Other Ambulatory Visit: Payer: Self-pay

## 2021-06-09 MED ORDER — ISOSORBIDE DINITRATE 30 MG PO TABS
30.0000 mg | ORAL_TABLET | Freq: Every day | ORAL | 2 refills | Status: DC
  Filled 2021-06-22: qty 30, 30d supply, fill #0
  Filled 2021-08-05: qty 30, 30d supply, fill #1

## 2021-06-10 ENCOUNTER — Other Ambulatory Visit: Payer: Self-pay

## 2021-06-22 ENCOUNTER — Other Ambulatory Visit: Payer: Self-pay

## 2021-06-22 MED FILL — Omega-3-acid Ethyl Esters Cap 1 GM: ORAL | 30 days supply | Qty: 30 | Fill #2 | Status: AC

## 2021-06-22 MED FILL — Carvedilol Tab 6.25 MG: ORAL | 30 days supply | Qty: 60 | Fill #2 | Status: AC

## 2021-06-22 MED FILL — Rosuvastatin Calcium Tab 40 MG: ORAL | 30 days supply | Qty: 30 | Fill #2 | Status: AC

## 2021-06-22 MED FILL — Losartan Potassium Tab 25 MG: ORAL | 30 days supply | Qty: 30 | Fill #2 | Status: AC

## 2021-08-01 NOTE — Progress Notes (Deleted)
Cardiology Office Note   Date:  08/01/2021   ID:  Virginia [name], DOB [birth year], MRN [id number]  PCP:  Dorena Dew, FNP    No chief complaint on file.  CAD  Wt Readings from Last 3 Encounters:  04/27/21 172 lb 0.4 oz (78 kg)  01/18/21 172 lb (78 kg)  12/11/20 169 lb (76.7 kg)       History of Present Illness: Virginia [name] is a 60 y.o. female    Who had a lateral MI in August 2016.  She was treated with 2 overlappig BMS in 2016.  She was cathed again a few months later shwing: Prox LAD lesion, 60% stenosed. 2nd Diag lesion, 95% stenosed. The left ventricular systolic function is normal. Mid LAD to Dist LAD lesion, 20% stenosed.   Normal LV function without residual wall motion abnormality with an ejection fraction of 55-65%.   Two-vessel CAD with 60% eccentric smooth tubular ostial LAD stenosis, 20% mid stenosis in the region of a very small bifurcating diagonal vessel with 95% stenosis in the inferior limb of the small diagonal branch; widely patent tandem bare-metal stents in the AV groove circumflex vessel; normal RCA.   RECOMMENDATION: Increased medical therapy.  Nitrates will be added to her regimen of beta blocker, ACE inhibitor and statin.  Consider amlodipine if recurrent symptomatology.   She had some vaginal bleeding post MI.    In 2019, she has had more chest pain.  It was atypical.  She had a low risk nuclear study in the hospital.  THere was a question of apical ischemia.  Nothing noted in the lateral wall where she had her MI.   Cath in Jan 2022 showed: " Severe de novo proximal LAD stenosis, treated successfully with a 2.75 x 15 mm resolute Onyx DES 2.  Continued patency of overlapping bare-metal stents in the left circumflex 3.  Mild to moderate diffuse nonobstructive coronary artery disease involving the ostial/proximal LAD, proximal circumflex, and mid/distal RCA, all appropriate for continued intensive medical therapy"   Tried to get Brilinta  through [employer].   She improved lifestyle in 2016 after her stents.  Despite maintaining this, she had another event in 2022.   She feels weaker.     She is back to walking 35-40 minutes a few times a week.     Past Medical History:  Diagnosis Date   Anemia    CAD (coronary artery disease)    a. 06/2015 BMS x2 to LCx. b. NSTEMI 11/2020 s/p DES to prox LAD; residual disease treated medically.   CKD (chronic kidney disease), stage II    Diabetes mellitus (HCC)    GERD (gastroesophageal reflux disease)    ?   GI bleeding    2016 - bleeding polyp clipped   Hyperlipidemia with target LDL less than 70    Hypertension    STEMI (ST elevation myocardial infarction) ([facility]) 06/16/2015   BMS x 2 CFX   Thyroid nodule    benign    Past Surgical History:  Procedure Laterality Date   ABDOMINAL HYSTERECTOMY N/A 02/02/2016   Procedure: HYSTERECTOMY ABDOMINAL WITH BILATERAL SALPINGECTOMY;  Surgeon: Woodroe Mode, MD;  Location: [facility];  Service: Gynecology;  Laterality: N/A;   APPENDECTOMY     CARDIAC CATHETERIZATION N/A 06/17/2015   Procedure: Left Heart Cath and Coronary Angiography;  Surgeon: Jettie Booze, MD;  Location: [facility];  Service: Cardiovascular;  Laterality: N/A;   CARDIAC CATHETERIZATION  06/17/2015  Procedure: Coronary Stent Intervention;  Surgeon: Jettie Booze, MD;  Location: [facility];  Service: Cardiovascular;;   CARDIAC CATHETERIZATION N/A 09/18/2015   Procedure: Left Heart Cath and Coronary Angiography;  Surgeon: Troy Sine, MD;  Location: [facility];  Service: Cardiovascular;  Laterality: N/A;   CESAREAN SECTION     Myomectomy done during cesarean section   COLONOSCOPY N/A 09/20/2015   Procedure: COLONOSCOPY;  Surgeon: Carol Ada, MD;  Location: [facility]COPY;  Service: Endoscopy;  Laterality: N/A;   COLONOSCOPY     CORONARY STENT INTERVENTION N/A 11/25/2020   Procedure: CORONARY STENT INTERVENTION;  Surgeon: Sherren Mocha, MD;   Location: [facility];  Service: Cardiovascular;  Laterality: N/A;   CORONARY STENT PLACEMENT  11/25/2020   ESOPHAGOGASTRODUODENOSCOPY N/A 09/20/2015   Procedure: ESOPHAGOGASTRODUODENOSCOPY (EGD);  Surgeon: Carol Ada, MD;  Location: [facility];  Service: Endoscopy;  Laterality: N/A;   LEFT HEART CATH AND CORONARY ANGIOGRAPHY N/A 11/25/2020   Procedure: LEFT HEART CATH AND CORONARY ANGIOGRAPHY;  Surgeon: Sherren Mocha, MD;  Location: [facility];  Service: Cardiovascular;  Laterality: N/A;   olympus  quick clip pro gastric body  10/09/15     Current Outpatient Medications  Medication Sig Dispense Refill   acetaminophen (TYLENOL) 500 MG tablet Take 1 tablet (500 mg total) by mouth every 6 (six) hours as needed. 30 tablet 0   aspirin EC 81 MG tablet Take 1 tablet (81 mg total) by mouth daily. 30 tablet 6   baclofen (LIORESAL) 10 MG tablet Take 0.5-1 tablets (5-10 mg total) by mouth 3 (three) times daily as needed for muscle spasms. 30 each 3   carvedilol (COREG) 6.25 MG tablet TAKE 1 TABLET (6.25 MG TOTAL) BY MOUTH TWO TIMES DAILY WITH A MEAL. 60 tablet 5   cyclobenzaprine (FLEXERIL) 5 MG tablet Take 1 tablet (5 mg total) by mouth at bedtime. 30 tablet 0   ferrous sulfate (FEROSUL) 325 (65 FE) MG tablet Take 1 tablet (325 mg total) by mouth daily with breakfast. 30 tablet 6   isosorbide dinitrate (ISORDIL) 30 MG tablet Take 1 tablet (30 mg total) by mouth daily. 90 tablet 2   losartan (COZAAR) 25 MG tablet TAKE 1 TABLET (25 MG TOTAL) BY MOUTH DAILY. 90 tablet 3   meloxicam (MOBIC) 7.5 MG tablet Take 1 tablet (7.5 mg total) by mouth daily. 30 tablet 0   metFORMIN (GLUCOPHAGE) 500 MG tablet Take 1 tablet (500 mg total) by mouth daily with breakfast. 90 tablet 1   nitroGLYCERIN (NITROSTAT) 0.4 MG SL tablet Place 1 tablet (0.4 mg total) under the tongue every 5 (five) minutes as needed for chest pain. 25 tablet 2   omega-3 acid ethyl esters (LOVAZA) 1 g capsule TAKE 1 CAPSULE  (1,000 MG TOTAL) BY MOUTH DAILY. 60 capsule 11   omega-3 fish oil (MAXEPA) 1000 MG CAPS capsule Take 1 capsule (1,000 mg total) by mouth daily. 60 capsule 11   omeprazole (PRILOSEC) 20 MG capsule Take 1 capsule (20 mg total) by mouth daily. 90 capsule 1   rosuvastatin (CRESTOR) 40 MG tablet TAKE 1 TABLET (40 MG TOTAL) BY MOUTH AT BEDTIME. 30 tablet 5   ticagrelor (BRILINTA) 90 MG TABS tablet TAKE 1 TABLET (90 MG TOTAL) BY MOUTH TWO TIMES DAILY. 60 tablet 11   vitamin B-12 (CYANOCOBALAMIN) 1000 MCG tablet Take 1 tablet (1,000 mcg total) by mouth daily. 90 tablet 1   No current facility-administered medications for this visit.  Allergies:   Patient has no known allergies.    Social History:  The patient  reports that she has never smoked. She has never used smokeless tobacco. She reports that she does not drink alcohol and does not use drugs.   Family History:  The patient's ***family history includes Asthma in her paternal uncle; Cancer in her father; Diabetes in her mother, paternal aunt, and paternal uncle; Heart attack in her brother and maternal uncle; Heart disease in her mother; Hyperlipidemia in her sister; Hypertension in her brother and sister; Stroke in her mother.    ROS:  Please see the history of present illness.   Otherwise, review of systems are positive for ***.   All other systems are reviewed and negative.    PHYSICAL EXAM: VS:  LMP 12/29/2015  , BMI There is no height or weight on file to calculate BMI. GEN: Well nourished, well developed, in no acute distress HEENT: normal Neck: no JVD, carotid bruits, or masses Cardiac: ***RRR; no murmurs, rubs, or gallops,no edema  Respiratory:  clear to auscultation bilaterally, normal work of breathing GI: soft, nontender, nondistended, + BS MS: no deformity or atrophy Skin: warm and dry, no rash Neuro:  Strength and sensation are intact Psych: euthymic mood, full affect   EKG:   The ekg ordered today demonstrates  ***   Recent Labs: 11/26/2020: Hemoglobin 13.0; Platelets 159 02/15/2021: ALT 12; BUN 10; Creatinine, Ser 0.97; Potassium 4.3; Sodium 140; TSH 1.330   Lipid Panel    Component Value Date/Time   CHOL 169 02/15/2021 1113   TRIG 53 02/15/2021 1113   HDL 65 02/15/2021 1113   CHOLHDL 2.6 02/15/2021 1113   CHOLHDL 4.0 11/25/2020 0316   VLDL 21 11/25/2020 0316   LDLCALC 93 02/15/2021 1113     Other studies Reviewed: Additional studies/ records that were reviewed today with results demonstrating: ***.   ASSESSMENT AND PLAN:  CAD:  HTN: Hyperlipidemia: CKD:    Current medicines are reviewed at length with the patient today.  The patient concerns regarding her medicines were addressed.  The following changes have been made:  No change***  Labs/ tests ordered today include: *** No orders of the defined types were placed in this encounter.   Recommend 150 minutes/week of aerobic exercise Low fat, low carb, high fiber diet recommended  Disposition:   FU in ***   Signed, Larae Grooms, MD  08/01/2021 9:41 PM    [facility] [address], [place], [place]  [address] Phone: [phone]; Fax: [phone]

## 2021-08-02 ENCOUNTER — Ambulatory Visit: Payer: Self-pay | Admitting: Interventional Cardiology

## 2021-08-02 DIAGNOSIS — E785 Hyperlipidemia, unspecified: Secondary | ICD-10-CM

## 2021-08-02 DIAGNOSIS — N182 Chronic kidney disease, stage 2 (mild): Secondary | ICD-10-CM

## 2021-08-02 DIAGNOSIS — I10 Essential (primary) hypertension: Secondary | ICD-10-CM

## 2021-08-02 DIAGNOSIS — I2510 Atherosclerotic heart disease of native coronary artery without angina pectoris: Secondary | ICD-10-CM

## 2021-08-03 ENCOUNTER — Other Ambulatory Visit: Payer: Self-pay

## 2021-08-03 ENCOUNTER — Ambulatory Visit (INDEPENDENT_AMBULATORY_CARE_PROVIDER_SITE_OTHER): Payer: Self-pay | Admitting: Family Medicine

## 2021-08-03 ENCOUNTER — Encounter: Payer: Self-pay | Admitting: Family Medicine

## 2021-08-03 VITALS — BP 139/79 | HR 74 | Temp 97.4°F | Ht 66.0 in | Wt 171.4 lb

## 2021-08-03 DIAGNOSIS — I10 Essential (primary) hypertension: Secondary | ICD-10-CM

## 2021-08-03 DIAGNOSIS — Z23 Encounter for immunization: Secondary | ICD-10-CM

## 2021-08-03 DIAGNOSIS — E010 Iodine-deficiency related diffuse (endemic) goiter: Secondary | ICD-10-CM

## 2021-08-03 DIAGNOSIS — M542 Cervicalgia: Secondary | ICD-10-CM

## 2021-08-03 DIAGNOSIS — E119 Type 2 diabetes mellitus without complications: Secondary | ICD-10-CM

## 2021-08-03 DIAGNOSIS — Z1231 Encounter for screening mammogram for malignant neoplasm of breast: Secondary | ICD-10-CM

## 2021-08-03 LAB — POCT URINALYSIS DIP (CLINITEK)
Bilirubin, UA: NEGATIVE
Glucose, UA: NEGATIVE mg/dL
Ketones, POC UA: NEGATIVE mg/dL
Nitrite, UA: NEGATIVE
POC PROTEIN,UA: NEGATIVE
Spec Grav, UA: 1.015 (ref 1.010–1.025)
Urobilinogen, UA: 0.2 E.U./dL
pH, UA: 7 (ref 5.0–8.0)

## 2021-08-03 LAB — POCT GLYCOSYLATED HEMOGLOBIN (HGB A1C)
HbA1c POC (<> result, manual entry): 6.3 % (ref 4.0–5.6)
HbA1c, POC (controlled diabetic range): 6.3 % (ref 0.0–7.0)
HbA1c, POC (prediabetic range): 6.3 % (ref 5.7–6.4)
Hemoglobin A1C: 6.3 % — AB (ref 4.0–5.6)

## 2021-08-03 MED ORDER — ROSUVASTATIN CALCIUM 40 MG PO TABS
ORAL_TABLET | Freq: Every day | ORAL | 5 refills | Status: DC
  Filled 2021-08-03: qty 30, 30d supply, fill #0

## 2021-08-03 MED ORDER — MELOXICAM 15 MG PO TABS
15.0000 mg | ORAL_TABLET | Freq: Every day | ORAL | 0 refills | Status: DC
  Filled 2021-08-03: qty 30, 30d supply, fill #0

## 2021-08-03 MED ORDER — METFORMIN HCL 500 MG PO TABS
500.0000 mg | ORAL_TABLET | Freq: Every day | ORAL | 1 refills | Status: DC
  Filled 2021-08-03: qty 30, 30d supply, fill #0

## 2021-08-03 MED ORDER — ACETAMINOPHEN-CODEINE #3 300-30 MG PO TABS: 1.0000 | ORAL_TABLET | Freq: Three times a day (TID) | ORAL | 0 refills | Status: DC | PRN
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
## 2021-08-03 NOTE — Patient Instructions (Addendum)
Increase Meloxicam to 15 mg daily, take with food.  Tylenol # 3 for moderate to severe pain. Refrain from driving or operating machinery while taking this medication. Follow up with orthopedic services.    Cervical Radiculopathy Cervical radiculopathy happens when a nerve in the neck (a cervical nerve) is pinched or bruised. This condition can happen because of an injury to the cervical spine (vertebrae) in the neck, or as part of the normal aging process. Pressure on the cervical nerves can cause pain or numbness that travels from the neck all the way down into the arm and fingers. Usually, this condition gets better with rest. Treatment may be needed if the condition does not improve. What are the causes? This condition may be caused by: A neck injury. A bulging (herniated) disk. Muscle spasms. Muscle tightness in the neck because of overuse. Arthritis. Breakdown or degeneration in the bones and joints of the spine (spondylosis) due to aging. Bone spurs that may develop near the cervical nerves. What are the signs or symptoms? Symptoms of this condition include: Pain. The pain may travel from the neck to the arm and hand. The pain can be severe or irritating. It may be worse when you move your neck. Numbness or tingling in your arm or hand. Weakness in the affected arm and hand, in severe cases. How is this diagnosed? This condition may be diagnosed based on your symptoms, your medical history, and a physical exam. You may also have tests, including: X-rays. A CT scan. An MRI. An electromyogram (EMG). Nerve conduction tests. How is this treated? In many cases, treatment is not needed for this condition. With rest, the condition usually gets better over time. If treatment is needed, options may include: Wearing a soft neck collar (cervical collar) for short periods of time, as told by your health care provider. Doing physical therapy to strengthen your neck muscles. Taking medicines,  such as NSAIDs or oral corticosteroids. Having spinal injections, in severe cases. Having surgery. This may be needed if other treatments do not help. Different types of surgery may be done depending on the cause of this condition. Follow these instructions at home: If you have a cervical collar: Wear it as told by your health care provider. Remove it only as told by your health care provider. Ask your health care provider if you can remove the collar for cleaning and bathing. If you are allowed to remove the collar for cleaning or bathing: Follow instructions from your health care provider about how to remove the collar safely. Clean the collar by wiping it with mild soap and water and drying it completely. Take out any removable pads in the collar every 1-2 days, and wash them by hand with soap and water. Let them air-dry completely before you put them back in the collar. Check your skin under the collar for irritation or sores. If you see any, tell your health care provider. Managing pain   Take over-the-counter and prescription medicines only as told by your health care provider. If directed, put ice on the affected area. If you have a soft neck collar, remove it as told by your health care provider. Put ice in a plastic bag. Place a towel between your skin and the bag. Leave the ice on for 20 minutes, 2-3 times a day. If applying ice does not help, you can try using heat. Use the heat source that your health care provider recommends, such as a moist heat pack or a heating pad.  Place a towel between your skin and the heat source. Leave the heat on for 20-30 minutes. Remove the heat if your skin turns bright red. This is especially important if you are unable to feel pain, heat, or cold. You may have a greater risk of getting burned. Try a gentle neck and shoulder massage to help relieve symptoms. Activity Rest as needed. Return to your normal activities as told by your health care  provider. Ask your health care provider what activities are safe for you. Do stretching and strengthening exercises as told by your health care provider or physical therapist. Do not lift anything that is heavier than 10 lb (4.5 kg) until your health care provider tells you that it is safe. General instructions Use a flat pillow when you sleep. Do not drive while wearing a cervical collar. If you do not have a cervical collar, ask your health care provider if it is safe to drive while your neck heals. Ask your health care provider if the medicine prescribed to you requires you to avoid driving or using heavy machinery. Do not use any products that contain nicotine or tobacco, such as cigarettes, e-cigarettes, and chewing tobacco. These can delay healing. If you need help quitting, ask your health care provider. Keep all follow-up visits as told by your health care provider. This is important. Contact a health care provider if: Your condition does not improve with treatment. Get help right away if: Your pain gets much worse and cannot be controlled with medicines. You have weakness or numbness in your hand, arm, face, or leg. You have a high fever. You have a stiff, rigid neck. You lose control of your bowels or your bladder (have incontinence). You have trouble with walking, balance, or speaking. Summary Cervical radiculopathy happens when a nerve in the neck is pinched or bruised. A nerve can get pinched from a bulging disk, arthritis, muscle spasms, or an injury to the neck. Symptoms include pain, tingling, or numbness radiating from the neck into the arm or hand. Weakness can also occur in severe cases. Treatment may include rest, wearing a cervical collar, and physical therapy. Medicines may be prescribed to help with pain. In severe cases, injections or surgery may be needed. This information is not intended to replace advice given to you by your health care provider. Make sure you  discuss any questions you have with your health care provider. Document Revised: 09/14/2018 Document Reviewed: 09/14/2018 Elsevier Patient Education  2022 [employer].

## 2021-08-04 ENCOUNTER — Telehealth: Payer: Self-pay | Admitting: Family Medicine

## 2021-08-04 LAB — COMPREHENSIVE METABOLIC PANEL
ALT: 10 IU/L (ref 0–32)
AST: 15 IU/L (ref 0–40)
Albumin/Globulin Ratio: 2 (ref 1.2–2.2)
Albumin: 4.8 g/dL (ref 3.8–4.8)
Alkaline Phosphatase: 118 IU/L (ref 44–121)
BUN/Creatinine Ratio: 12 (ref 12–28)
BUN: 11 mg/dL (ref 8–27)
Bilirubin Total: 0.5 mg/dL (ref 0.0–1.2)
CO2: 27 mmol/L (ref 20–29)
Calcium: 9.5 mg/dL (ref 8.7–10.3)
Chloride: 101 mmol/L (ref 96–106)
Creatinine, Ser: 0.94 mg/dL (ref 0.57–1.00)
Globulin, Total: 2.4 g/dL (ref 1.5–4.5)
Glucose: 92 mg/dL (ref 70–99)
Potassium: 4 mmol/L (ref 3.5–5.2)
Sodium: 140 mmol/L (ref 134–144)
Total Protein: 7.2 g/dL (ref 6.0–8.5)
eGFR: 69 mL/min/{1.73_m2} (ref >59)

## 2021-08-04 LAB — THYROID PANEL WITH TSH
Free Thyroxine Index: 2.3 (ref 1.2–4.9)
T3 Uptake Ratio: 26 % (ref 24–39)
T4, Total: 8.9 ug/dL (ref 4.5–12.0)
TSH: 1.22 u[IU]/mL (ref 0.450–4.500)

## 2021-08-04 NOTE — Telephone Encounter (Signed)
A 60 year old female with a medical history significant for type 2 diabetes mellitus, hypertension, CAD, and hyperlipidemia presenting for 54-month follow-up of chronic conditions.  On yesterday, patient's hemoglobin A1c was 6.3, which is at goal.  Also, blood pressure at goal.  No medication changes warranted at this time.  Reviewed all laboratory values, all within normal range including thyroid panel.  No further interventions warranted at this time.  Continue to follow-up every 3 months as scheduled.   Donia Pounds  APRN, MSN, FNP-C Patient [facility] [address] [place], [place] [address] [phone]

## 2021-08-05 ENCOUNTER — Other Ambulatory Visit: Payer: Self-pay

## 2021-08-05 MED FILL — Losartan Potassium Tab 25 MG: ORAL | 30 days supply | Qty: 30 | Fill #3 | Status: AC

## 2021-08-06 ENCOUNTER — Other Ambulatory Visit: Payer: Self-pay

## 2021-08-09 ENCOUNTER — Other Ambulatory Visit: Payer: Self-pay

## 2021-08-11 ENCOUNTER — Other Ambulatory Visit: Payer: Self-pay

## 2021-08-26 ENCOUNTER — Other Ambulatory Visit: Payer: Self-pay

## 2021-08-26 MED FILL — Carvedilol Tab 6.25 MG: ORAL | 30 days supply | Qty: 60 | Fill #3 | Status: AC

## 2021-08-26 MED FILL — Omega-3-acid Ethyl Esters Cap 1 GM: ORAL | 30 days supply | Qty: 30 | Fill #3 | Status: AC

## 2021-08-27 ENCOUNTER — Other Ambulatory Visit: Payer: Self-pay

## 2021-11-02 ENCOUNTER — Ambulatory Visit: Payer: Self-pay | Admitting: Family Medicine

## 2022-09-12 ENCOUNTER — Other Ambulatory Visit: Payer: Self-pay

## 2022-09-19 ENCOUNTER — Other Ambulatory Visit: Payer: Self-pay

## 2022-11-24 ENCOUNTER — Telehealth: Payer: Self-pay

## 2022-11-24 ENCOUNTER — Encounter: Payer: Self-pay | Admitting: Family Medicine

## 2022-11-24 NOTE — Telephone Encounter (Signed)
Patient not seen since 06/2021. Called to make appt if still patient and inquire about mammogram. No answer and no VM set up.

## 2023-02-07 ENCOUNTER — Observation Stay (HOSPITAL_COMMUNITY)
Admission: EM | Admit: 2023-02-07 | Discharge: 2023-02-08 | Disposition: A | Discharge status: Home / Self Care | Payer: Medicaid Other | Attending: Family Medicine | Admitting: Family Medicine

## 2023-02-07 ENCOUNTER — Emergency Department (HOSPITAL_COMMUNITY): Payer: Medicaid Other

## 2023-02-07 DIAGNOSIS — R072 Precordial pain: Secondary | ICD-10-CM | POA: Diagnosis not present

## 2023-02-07 DIAGNOSIS — I2510 Atherosclerotic heart disease of native coronary artery without angina pectoris: Secondary | ICD-10-CM

## 2023-02-07 DIAGNOSIS — R079 Chest pain, unspecified: Secondary | ICD-10-CM | POA: Diagnosis present

## 2023-02-07 DIAGNOSIS — E876 Hypokalemia: Secondary | ICD-10-CM | POA: Diagnosis not present

## 2023-02-07 DIAGNOSIS — Z9861 Coronary angioplasty status: Secondary | ICD-10-CM

## 2023-02-07 DIAGNOSIS — Z79899 Other long term (current) drug therapy: Secondary | ICD-10-CM | POA: Insufficient documentation

## 2023-02-07 DIAGNOSIS — I129 Hypertensive chronic kidney disease with stage 1 through stage 4 chronic kidney disease, or unspecified chronic kidney disease: Secondary | ICD-10-CM | POA: Insufficient documentation

## 2023-02-07 DIAGNOSIS — N182 Chronic kidney disease, stage 2 (mild): Secondary | ICD-10-CM | POA: Diagnosis not present

## 2023-02-07 DIAGNOSIS — Z7982 Long term (current) use of aspirin: Secondary | ICD-10-CM | POA: Insufficient documentation

## 2023-02-07 DIAGNOSIS — E1121 Type 2 diabetes mellitus with diabetic nephropathy: Secondary | ICD-10-CM

## 2023-02-07 DIAGNOSIS — Z955 Presence of coronary angioplasty implant and graft: Secondary | ICD-10-CM | POA: Insufficient documentation

## 2023-02-07 DIAGNOSIS — E1122 Type 2 diabetes mellitus with diabetic chronic kidney disease: Secondary | ICD-10-CM | POA: Diagnosis not present

## 2023-02-07 DIAGNOSIS — E785 Hyperlipidemia, unspecified: Secondary | ICD-10-CM | POA: Diagnosis present

## 2023-02-07 DIAGNOSIS — E119 Type 2 diabetes mellitus without complications: Secondary | ICD-10-CM

## 2023-02-07 DIAGNOSIS — I160 Hypertensive urgency: Secondary | ICD-10-CM | POA: Diagnosis not present

## 2023-02-07 DIAGNOSIS — I10 Essential (primary) hypertension: Secondary | ICD-10-CM | POA: Diagnosis present

## 2023-02-07 DIAGNOSIS — R0789 Other chest pain: Secondary | ICD-10-CM | POA: Diagnosis present

## 2023-02-07 DIAGNOSIS — Z794 Long term (current) use of insulin: Secondary | ICD-10-CM

## 2023-02-07 LAB — COMPREHENSIVE METABOLIC PANEL
ALT: 13 U/L (ref 0–44)
AST: 27 U/L (ref 15–41)
Albumin: 3.7 g/dL (ref 3.5–5.0)
Alkaline Phosphatase: 83 U/L (ref 38–126)
Anion gap: 13 (ref 5–15)
BUN: 10 mg/dL (ref 8–23)
CO2: 21 mmol/L — ABNORMAL LOW (ref 22–32)
Calcium: 8.9 mg/dL (ref 8.9–10.3)
Chloride: 102 mmol/L (ref 98–111)
Creatinine, Ser: 1.05 mg/dL — ABNORMAL HIGH (ref 0.44–1.00)
GFR, Estimated: >60 mL/min (ref >60)
Glucose, Bld: 141 mg/dL — ABNORMAL HIGH (ref 70–99)
Potassium: 3.5 mmol/L (ref 3.5–5.1)
Sodium: 136 mmol/L (ref 135–145)
Total Bilirubin: 0.6 mg/dL (ref 0.3–1.2)
Total Protein: 6.8 g/dL (ref 6.5–8.1)

## 2023-02-07 LAB — CBC WITH DIFFERENTIAL/PLATELET
Abs Immature Granulocytes: 0 10*3/uL (ref 0.00–0.07)
Basophils Absolute: 0 10*3/uL (ref 0.0–0.1)
Basophils Relative: 1 %
Eosinophils Absolute: 0.1 10*3/uL (ref 0.0–0.5)
Eosinophils Relative: 2 %
HCT: 38.7 % (ref 36.0–46.0)
Hemoglobin: 13.3 g/dL (ref 12.0–15.0)
Immature Granulocytes: 0 %
Lymphocytes Relative: 47 %
Lymphs Abs: 2.3 10*3/uL (ref 0.7–4.0)
MCH: 30.3 pg (ref 26.0–34.0)
MCHC: 34.4 g/dL (ref 30.0–36.0)
MCV: 88.2 fL (ref 80.0–100.0)
Monocytes Absolute: 0.3 10*3/uL (ref 0.1–1.0)
Monocytes Relative: 6 %
Neutro Abs: 2.1 10*3/uL (ref 1.7–7.7)
Neutrophils Relative %: 44 %
Platelets: 196 10*3/uL (ref 150–400)
RBC: 4.39 MIL/uL (ref 3.87–5.11)
RDW: 13.7 % (ref 11.5–15.5)
WBC: 4.9 10*3/uL (ref 4.0–10.5)
nRBC: 0 % (ref 0.0–0.2)

## 2023-02-07 LAB — TROPONIN I (HIGH SENSITIVITY): Troponin I (High Sensitivity): 6 ng/L (ref <18)

## 2023-02-07 MED ORDER — MORPHINE SULFATE (PF) 2 MG/ML IV SOLN: 2.0000 mg | INTRAVENOUS | Status: DC | PRN

## 2023-02-07 MED ORDER — INSULIN ASPART 100 UNIT/ML IJ SOLN: 0.0000 [IU] | Freq: Every day | INTRAMUSCULAR | Status: DC

## 2023-02-07 MED ORDER — TRAMADOL HCL 50 MG PO TABS: 50.0000 mg | ORAL_TABLET | Freq: Four times a day (QID) | ORAL | Status: DC | PRN

## 2023-02-07 MED ORDER — INSULIN ASPART 100 UNIT/ML IJ SOLN: 0.0000 [IU] | Freq: Three times a day (TID) | INTRAMUSCULAR | Status: DC

## 2023-02-07 MED ORDER — ISOSORBIDE MONONITRATE 20 MG PO TABS: 10.0000 mg | ORAL_TABLET | Freq: Two times a day (BID) | ORAL | Status: DC

## 2023-02-07 MED ORDER — ASPIRIN 81 MG PO TBEC
81.0000 mg | DELAYED_RELEASE_TABLET | Freq: Every day | ORAL | Status: DC
  Administered 2023-02-08: 81 mg via ORAL
  Filled 2023-02-07: qty 1

## 2023-02-07 MED ORDER — ISOSORBIDE MONONITRATE 20 MG PO TABS
10.0000 mg | ORAL_TABLET | Freq: Two times a day (BID) | ORAL | Status: DC
  Administered 2023-02-08: 10 mg via ORAL
  Filled 2023-02-07: qty 1

## 2023-02-07 MED ORDER — POTASSIUM CHLORIDE CRYS ER 20 MEQ PO TBCR
40.0000 meq | EXTENDED_RELEASE_TABLET | Freq: Once | ORAL | Status: AC
  Administered 2023-02-08: 40 meq via ORAL
  Filled 2023-02-07: qty 2

## 2023-02-07 NOTE — Assessment & Plan Note (Signed)
-   Order Sensitive  SSI     -  check TSH and HgA1C  - Hold by mouth medications*  
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
## 2023-02-07 NOTE — Assessment & Plan Note (Addendum)
Continue aspirin 81 mg po , statin Crestor 40 mg po q day  Restart imdur and metoprolol
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
## 2023-02-07 NOTE — H&P (Signed)
Virginia [name] [id number] DOB: 09-16-1961 DOA: 02/07/2023     PCP: Dorena Dew, FNP   Outpatient Specialists:  CARDS:  Dr. Larae Grooms, MD    Patient arrived to ER on 02/07/23 at 2040 Referred by Attending Davonna Belling, MD   Patient coming from:    home Lives  With family    Chief Complaint:   Chief Complaint  Patient presents with   Chest Pain    HPI: Virginia [name] is a 62 y.o. female with medical history significant of CAD sp stents, DM2, HTN, HLD    Presented with   chest pain Patient presents after she experienced chest pain and chest tightness in the setting of road rage episode patient was brought in by police.  Pain radiating to her left shoulder and left arm history of MRI in January 2023 associated shortness of breath complaining of chest pain 8 out of 10 EMS administered 2 of nitro 324 of aspirin blood pressure 178 118 Tachycardic up to 110 Reports anterior chest pain still somewhat present but improved reports similar to her prior heart attack reports for the past few weeks she has been having intermittent chest pain and difficulty breathing.  When she walks around she gets chest pain that radiates to her right side jaw.  Has been having more shortness of breath she cannot even walk up to second floor without getting shortness of breath.  No associated cough or fevers no chills    No recent trips  Reports she got surrendered by cars and had to drive them to police station  Reports CP is ongoing non positional, non pleuritic, repots walking make is burn Pain would radiate down her right leg as well Pt states she has been taken off a lot of her medications   Denies significant ETOH intake   Does not smoke   Lab Results  Component Value Date   [place] NEGATIVE 11/24/2020       Regarding pertinent Chronic problems:    Hyperlipidemia -  no longer Crestor Lipid Panel     Component Value Date/Time   CHOL 169 02/15/2021 1113   TRIG 53  02/15/2021 1113   HDL 65 02/15/2021 1113   CHOLHDL 2.6 02/15/2021 1113   CHOLHDL 4.0 11/25/2020 0316   VLDL 21 11/25/2020 0316   LDLCALC 93 02/15/2021 1113   LABVLDL 11 02/15/2021 1113      HTN she is unsure what she takes She has been off bp medication for the past 3 moth    chronic CHF diastolic/systolic/ combined - last echo sep 2022 Recent Results (from the past 43800 hour(s))  ECHOCARDIOGRAM COMPLETE   Collection Time: 11/25/20  3:02 PM  Result Value   Weight 2,627.2   Height 66   BP 153/88   Single Plane A2C EF 61.8   Single Plane A4C EF 66.7   Calc EF 62.5   S' Lateral 2.70   Area-P 1/2 6.71   Narrative        IMPRESSIONS    1. Left ventricular ejection fraction, by estimation, is 60 to 65%. The left ventricle has normal function. The left ventricle has no regional wall motion abnormalities. There is mild concentric left ventricular hypertrophy. Left ventricular diastolic  parameters are consistent with Grade II diastolic dysfunction (pseudonormalization).  2. Right ventricular systolic function is normal. The right ventricular size is normal. There is normal pulmonary artery systolic pressure.  3. The mitral valve is normal in structure. Trivial mitral valve  regurgitation. No evidence of mitral stenosis.  4. The aortic valve is tricuspid. Aortic valve regurgitation is not visualized. No aortic stenosis is present.  5. The inferior vena cava is normal in size with greater than 50% respiratory variability, suggesting right atrial pressure of 3 mmHg.             CAD  - On Aspirin, statin, betablocker,                  -  followed by cardiology                - last cardiac cath January 2022   2nd Diag lesion is 95% stenosed. Mid LAD to Dist LAD lesion is 20% stenosed. Non-stenotic Mid Cx to Dist Cx lesion was previously treated. Ost LAD to Prox LAD lesion is 50% stenosed. Prox LAD lesion is 95% stenosed. A drug-eluting stent was successfully placed using a STENT  RESOLUTE ONYX [id number]. Post intervention, there is a 0% residual stenosis. Prox RCA to Mid RCA lesion is 50% stenosed. Dist RCA lesion is 50% stenosed.    DM 2 -  Lab Results  Component Value Date   HGBA1C 6.3 (A) 08/03/2021   HGBA1C 6.3 08/03/2021   HGBA1C 6.3 08/03/2021   HGBA1C 6.3 08/03/2021  Diet controlled   Iron/TIBC/Ferritin/ %Sat    Component Value Date/Time   IRON 44 09/19/2015 1244   TIBC 228 (L) 09/19/2015 1244   FERRITIN 70 09/19/2015 1244   IRONPCTSAT 19 09/19/2015 1244   While in ER:     Troponin neg ECG non acute  Lab Orders         CBC with Differential         Comprehensive metabolic panel       CXR - ordered    Following Medications were ordered in ER: Medications - No data to display  _______________________________________________________ ER Provider Called:    Cardiology  They Recommend admit to medicine   Re consult if echo is abnormal    ED Triage Vitals  Enc Vitals Group     BP 02/07/23 2041 (!) 148/102     Pulse Rate 02/07/23 2041 (!) 109     Resp 02/07/23 2041 19     Temp 02/07/23 2041 99.1 F (37.3 C)     Temp Source 02/07/23 2041 Oral     SpO2 02/07/23 2041 100 %     Weight --      Height --      Head Circumference --      Peak Flow --      Pain Score 02/07/23 2046 8     Pain Loc --      Pain Edu? --      Excl. in [place]? --   TMAX(24)@     _________________________________________ Significant initial  Findings: Abnormal Labs Reviewed  COMPREHENSIVE METABOLIC PANEL - Abnormal; Notable for the following components:      Result Value   CO2 21 (*)    Glucose, Bld 141 (*)    Creatinine, Ser 1.05 (*)    All other components within normal limits      _________________________ Troponin   Cardiac Panel (last 3 results) Recent Labs    02/07/23 2050  TROPONINIHS 6    ECG: Ordered Personally reviewed and interpreted by me showing: HR : 109 Rhythm:   Sinus tachycardia  no evidence of ischemic changes QTC 436    The  recent clinical data is shown below. Vitals:  02/07/23 2041 02/07/23 2200 02/07/23 2215  BP: (!) 148/102 (!) 149/114 (!) 175/103  Pulse: (!) 109  80  Resp: 19  16  Temp: 99.1 F (37.3 C)    TempSrc: Oral    SpO2: 100%  100%    WBC     Component Value Date/Time   WBC 4.9 02/07/2023 2050   LYMPHSABS 2.3 02/07/2023 2050   LYMPHSABS 2.6 04/03/2020 1134   MONOABS 0.3 02/07/2023 2050   EOSABS 0.1 02/07/2023 2050   EOSABS 0.2 04/03/2020 1134   BASOSABS 0.0 02/07/2023 2050   BASOSABS 0.0 04/03/2020 1134         UA   ordered     Results for orders placed or performed in visit on 04/27/21  Urine Culture     Status: None   Collection Time: 04/27/21  2:35 PM   Specimen: Urine   UR  Result Value Ref Range Status   Urine Culture, Routine Final report  Final   Organism ID, Bacteria Comment  Final    Comment: Culture shows less than 10,000 colony forming units of bacteria per milliliter of urine. This colony count is not generally considered to be clinically significant.        __________________________________________________________ Recent Labs  Lab 02/07/23 2050  NA 136  K 3.5  CO2 21*  GLUCOSE 141*  BUN 10  CREATININE 1.05*  CALCIUM 8.9    Cr  stable,  Lab Results  Component Value Date   CREATININE 1.05 (H) 02/07/2023   CREATININE 0.94 08/03/2021   CREATININE 0.97 02/15/2021    Recent Labs  Lab 02/07/23 2050  AST 27  ALT 13  ALKPHOS 83  BILITOT 0.6  PROT 6.8  ALBUMIN 3.7   Lab Results  Component Value Date   CALCIUM 8.9 02/07/2023          Plt: Lab Results  Component Value Date   PLT 196 02/07/2023         Recent Labs  Lab 02/07/23 2050  WBC 4.9  NEUTROABS 2.1  HGB 13.3  HCT 38.7  MCV 88.2  PLT 196    HG/HCT * stable,  Down *Up from baseline see below    Component Value Date/Time   HGB 13.3 02/07/2023 2050   HGB 14.3 04/03/2020 1134   HCT 38.7 02/07/2023 2050   HCT 43.3 04/03/2020 1134   MCV 88.2 02/07/2023 2050   MCV 91  04/03/2020 1134      No results for input(s): "LIPASE", "AMYLASE" in the last 168 hours. No results for input(s): "AMMONIA" in the last 168 hours.    .lab  _______________________________________________ Hospitalist was called for admission for *** Chest pain, unspecified type ***    The following Work up has been ordered so far:  Orders Placed This Encounter  Procedures   DG Chest 2 View   CBC with Differential   Comprehensive metabolic panel   Document Height and Actual Weight   Inpatient consult to Cardiology   Consult to hospitalist   EKG 12-Lead   ED EKG     OTHER Significant initial  Findings:  labs showing:     DM  labs:  HbA1C: No results for input(s): "HGBA1C" in the last 8760 hours.     CBG (last 3)  No results for input(s): "GLUCAP" in the last 72 hours.        Cultures: No results found for: "SDES", "SPECREQUEST", "CULT", "REPTSTATUS"   Radiological Exams on Admission: No results found. _______________________________________________________________________________________________________ Latest  Blood  pressure (!) 175/103, pulse 80, temperature 99.1 F (37.3 C), temperature source Oral, resp. rate 16, last menstrual period 12/29/2015, SpO2 100 %.   Vitals  labs and radiology finding personally reviewed  Review of Systems:    Pertinent positives include: ***  Constitutional:  No weight loss, night sweats, Fevers, chills, fatigue, weight loss  HEENT:  No headaches, Difficulty swallowing,Tooth/dental problems,Sore throat,  No sneezing, itching, ear ache, nasal congestion, post nasal drip,  Cardio-vascular:  No chest pain, Orthopnea, PND, anasarca, dizziness, palpitations.no Bilateral lower extremity swelling  GI:  No heartburn, indigestion, abdominal pain, nausea, vomiting, diarrhea, change in bowel habits, loss of appetite, melena, blood in stool, hematemesis Resp:  no shortness of breath at rest. No dyspnea on exertion, No excess mucus,  no productive cough, No non-productive cough, No coughing up of blood.No change in color of mucus.No wheezing. Skin:  no rash or lesions. No jaundice GU:  no dysuria, change in color of urine, no urgency or frequency. No straining to urinate.  No flank pain.  Musculoskeletal:  No joint pain or no joint swelling. No decreased range of motion. No back pain.  Psych:  No change in mood or affect. No depression or anxiety. No memory loss.  Neuro: no localizing neurological complaints, no tingling, no weakness, no double vision, no gait abnormality, no slurred speech, no confusion  All systems reviewed and apart from [place] all are negative _______________________________________________________________________________________________ Past Medical History:   Past Medical History:  Diagnosis Date   Anemia    CAD (coronary artery disease)    a. 06/2015 BMS x2 to LCx. b. NSTEMI 11/2020 s/p DES to prox LAD; residual disease treated medically.   CKD (chronic kidney disease), stage II    Diabetes mellitus (HCC)    GERD (gastroesophageal reflux disease)    ?   GI bleeding    2016 - bleeding polyp clipped   Hyperlipidemia with target LDL less than 70    Hypertension    STEMI (ST elevation myocardial infarction) ([facility]) 06/16/2015   BMS x 2 CFX   Thyroid nodule    benign      Past Surgical History:  Procedure Laterality Date   ABDOMINAL HYSTERECTOMY N/A 02/02/2016   Procedure: HYSTERECTOMY ABDOMINAL WITH BILATERAL SALPINGECTOMY;  Surgeon: Woodroe Mode, MD;  Location: [facility];  Service: Gynecology;  Laterality: N/A;   APPENDECTOMY     CARDIAC CATHETERIZATION N/A 06/17/2015   Procedure: Left Heart Cath and Coronary Angiography;  Surgeon: Jettie Booze, MD;  Location: [facility];  Service: Cardiovascular;  Laterality: N/A;   CARDIAC CATHETERIZATION  06/17/2015   Procedure: Coronary Stent Intervention;  Surgeon: Jettie Booze, MD;  Location: [facility];  Service:  Cardiovascular;;   CARDIAC CATHETERIZATION N/A 09/18/2015   Procedure: Left Heart Cath and Coronary Angiography;  Surgeon: Troy Sine, MD;  Location: [facility];  Service: Cardiovascular;  Laterality: N/A;   CESAREAN SECTION     Myomectomy done during cesarean section   COLONOSCOPY N/A 09/20/2015   Procedure: COLONOSCOPY;  Surgeon: Carol Ada, MD;  [facility];  Service: Endoscopy;  Laterality: N/A;   COLONOSCOPY     CORONARY STENT INTERVENTION N/A 11/25/2020   Procedure: CORONARY STENT INTERVENTION;  Surgeon: Sherren Mocha, MD;  Location: [facility];  Service: Cardiovascular;  Laterality: N/A;   CORONARY STENT PLACEMENT  11/25/2020   ESOPHAGOGASTRODUODENOSCOPY N/A 09/20/2015   Procedure: ESOPHAGOGASTRODUODENOSCOPY (EGD);  Surgeon: Carol Ada, MD;  Location: [facility];  Service: Endoscopy;  Laterality: N/A;   LEFT HEART CATH AND CORONARY ANGIOGRAPHY N/A 11/25/2020   Procedure: LEFT HEART CATH AND CORONARY ANGIOGRAPHY;  Surgeon: Sherren Mocha, MD;  Location: [facility];  Service: Cardiovascular;  Laterality: N/A;   olympus  quick clip pro gastric body  10/09/15    Social History:  Ambulatory *** independently cane, walker  wheelchair bound, bed bound     reports that she has never smoked. She has never used smokeless tobacco. She reports that she does not drink alcohol and does not use drugs.     Family History: *** Family History  Problem Relation Age of Onset   Stroke Mother    Heart disease Mother    Diabetes Mother    Cancer Father    Hypertension Sister    Hyperlipidemia Sister    Heart attack Brother    Hypertension Brother    Heart attack Maternal Uncle    Asthma Paternal Uncle    Diabetes Paternal Uncle    Diabetes Paternal Aunt    Thyroid disease Neg Hx    ______________________________________________________________________________________________ Allergies: No Known Allergies   Prior to Admission medications    Medication Sig Start Date End Date Taking? Authorizing Provider  acetaminophen (TYLENOL) 500 MG tablet Take 1 tablet (500 mg total) by mouth every 6 (six) hours as needed. 04/27/21   Dorena Dew, FNP  acetaminophen-codeine (TYLENOL #3) 300-30 MG tablet Take 1 tablet by mouth every 8 (eight) hours as needed for moderate pain. 08/03/21   Dorena Dew, FNP  aspirin EC 81 MG tablet Take 1 tablet (81 mg total) by mouth daily. 09/27/19   Azzie Glatter, FNP  baclofen (LIORESAL) 10 MG tablet Take 0.5-1 tablets (5-10 mg total) by mouth 3 (three) times daily as needed for muscle spasms. 05/04/21   Hilts, Legrand Como, MD  cyclobenzaprine (FLEXERIL) 5 MG tablet Take 1 tablet (5 mg total) by mouth at bedtime. 04/27/21   Dorena Dew, FNP  ferrous sulfate (FEROSUL) 325 (65 FE) MG tablet Take 1 tablet (325 mg total) by mouth daily with breakfast. 04/09/21   Vevelyn Francois, NP  isosorbide dinitrate (ISORDIL) 30 MG tablet Take 1 tablet (30 mg total) by mouth daily. 11/26/20   Bhagat, Crista Luria, PA  losartan (COZAAR) 25 MG tablet TAKE 1 TABLET (25 MG TOTAL) BY MOUTH DAILY. 12/17/20 12/17/21  Dunn, Nedra Hai, PA-C  meloxicam (MOBIC) 15 MG tablet Take 1 tablet (15 mg total) by mouth daily. 08/03/21   Dorena Dew, FNP  metFORMIN (GLUCOPHAGE) 500 MG tablet Take 1 tablet (500 mg total) by mouth daily with breakfast. 08/03/21   Dorena Dew, FNP  nitroGLYCERIN (NITROSTAT) 0.4 MG SL tablet Place 1 tablet (0.4 mg total) under the tongue every 5 (five) minutes as needed for chest pain. 04/09/21   Vevelyn Francois, NP  omega-3 fish oil (MAXEPA) 1000 MG CAPS capsule Take 1 capsule (1,000 mg total) by mouth daily. 10/06/20   Azzie Glatter, FNP  omeprazole (PRILOSEC) 20 MG capsule Take 1 capsule (20 mg total) by mouth daily. 04/09/21   Vevelyn Francois, NP  rosuvastatin (CRESTOR) 40 MG tablet TAKE 1 TABLET (40 MG TOTAL) BY MOUTH AT BEDTIME. 08/03/21 08/03/22  Dorena Dew, FNP  vitamin B-12 (CYANOCOBALAMIN) 1000  MCG tablet Take 1 tablet (1,000 mcg total) by mouth daily. 09/27/19   Azzie Glatter, FNP  isosorbide mononitrate (IMDUR) 30 MG 24 hr tablet TAKE 1 TABLET (30 MG TOTAL) BY MOUTH  DAILY. 11/26/20 06/09/21  Leanor Kail, PA  lisinopril (ZESTRIL) 2.5 MG tablet Take 1 tablet (2.5 mg total) by mouth daily. 10/13/20 11/26/20  Azzie Glatter, FNP  metoprolol tartrate (LOPRESSOR) 25 MG tablet Take 0.5 tablets (12.5 mg total) by mouth 2 (two) times daily. 10/13/20 11/26/20  Azzie Glatter, FNP    ___________________________________________________________________________________________________ Physical Exam:    02/07/2023   10:15 PM 02/07/2023   10:00 PM 02/07/2023    8:41 PM  Vitals with BMI  Systolic 0000000 123456 123456  Diastolic [id number]  Pulse 80  109     1. General:  in No ***Acute distress***increased work of breathing ***complaining of severe pain****agitated * Chronically ill *well *cachectic *toxic acutely ill -appearing 2. Psychological: Alert and *** Oriented 3. Head/ENT:   Moist *** Dry Mucous Membranes                          Head Non traumatic, neck supple                          Normal *** Poor Dentition 4. SKIN: normal *** decreased Skin turgor,  Skin clean Dry and intact no rash @IMAGES @  5. Heart: Regular rate and rhythm no*** Murmur, no Rub or gallop 6. Lungs: ***Clear to auscultation bilaterally, no wheezes or crackles   7. Abdomen: Soft, ***non-tender, Non distended *** obese ***bowel sounds present 8. Lower extremities: no clubbing, cyanosis, no ***edema 9. Neurologically Grossly intact, moving all 4 extremities equally *** strength 5 out of 5 in all 4 extremities cranial nerves II through XII intact 10. MSK: Normal range of motion    Chart has been reviewed  ______________________________________________________________________________________________  Assessment/Plan  ***  Admitted for *** Chest pain, unspecified type ***    Present on Admission:   Hyperlipidemia with target LDL less than 70     Hyperlipidemia with target LDL less than 70 Continue Crestor 40 mg a day   Other plan as per orders.  DVT prophylaxis:  SCD      Code Status:    Code Status: Prior FULL CODE as per patient   I had personally discussed CODE STATUS with patient and family  ACP none   Family Communication:   Family not at  Bedside  plan of care was discussed on the phone with *** Son, Daughter, Wife, Husband, Sister, Brother , father, mother  Diet    Disposition Plan:   *** likely will need placement for rehabilitation                          Back to current facility when stable                            To home once workup is complete and patient is stable  ***Following barriers for discharge:                            Electrolytes corrected                               Anemia corrected                             Pain  controlled with PO medications                               Afebrile, white count improving able to transition to PO antibiotics                             Will need to be able to tolerate PO                            Will likely need home health, home O2, set up                           Will need consultants to evaluate patient prior to discharge  ****[place] Orders  (From admission, onward)           Start     Ordered   02/07/23 2226  Consult to hospitalist  Once       Provider:  (Not yet assigned)  Question Answer Comment  Place call to: [facility]   Reason for Consult Admit      02/07/23 2225                              ***Would benefit from PT/OT eval prior to DC  Ordered                   Swallow eval - SLP ordered                   Diabetes care coordinator                   Transition of care consulted                   Nutrition    consulted                  Wound care  consulted                   Palliative care    consulted                   Behavioral  health  consulted                    Consults called: ***     Admission status:  ED Disposition     None        Obs    Level of care       progressive tele indefinitely please discontinue once patient no longer qualifies COVID-19 Labs    Tahj Njoku 02/07/2023, 10:53 PM ***  [facility]     after 2 AM please page floor coverage PA If 7AM-7PM, please contact the day team taking care of the patient using [email or web address]

## 2023-02-07 NOTE — Assessment & Plan Note (Signed)
Continue Crestor 40 mg a day

## 2023-02-08 ENCOUNTER — Other Ambulatory Visit: Payer: Self-pay

## 2023-02-08 ENCOUNTER — Observation Stay (HOSPITAL_BASED_OUTPATIENT_CLINIC_OR_DEPARTMENT_OTHER): Payer: Medicaid Other

## 2023-02-08 ENCOUNTER — Encounter (HOSPITAL_COMMUNITY): Payer: Self-pay | Admitting: Internal Medicine

## 2023-02-08 ENCOUNTER — Other Ambulatory Visit (HOSPITAL_COMMUNITY): Payer: Self-pay

## 2023-02-08 DIAGNOSIS — E876 Hypokalemia: Secondary | ICD-10-CM | POA: Diagnosis not present

## 2023-02-08 DIAGNOSIS — R072 Precordial pain: Secondary | ICD-10-CM | POA: Diagnosis not present

## 2023-02-08 DIAGNOSIS — R079 Chest pain, unspecified: Secondary | ICD-10-CM

## 2023-02-08 DIAGNOSIS — I160 Hypertensive urgency: Secondary | ICD-10-CM | POA: Diagnosis not present

## 2023-02-08 DIAGNOSIS — I2510 Atherosclerotic heart disease of native coronary artery without angina pectoris: Secondary | ICD-10-CM | POA: Diagnosis not present

## 2023-02-08 DIAGNOSIS — R0789 Other chest pain: Secondary | ICD-10-CM | POA: Diagnosis not present

## 2023-02-08 LAB — COMPREHENSIVE METABOLIC PANEL
ALT: 12 U/L (ref 0–44)
AST: 16 U/L (ref 15–41)
Albumin: 3.7 g/dL (ref 3.5–5.0)
Alkaline Phosphatase: 80 U/L (ref 38–126)
Anion gap: 9 (ref 5–15)
BUN: 8 mg/dL (ref 8–23)
CO2: 25 mmol/L (ref 22–32)
Calcium: 9 mg/dL (ref 8.9–10.3)
Chloride: 104 mmol/L (ref 98–111)
Creatinine, Ser: 0.97 mg/dL (ref 0.44–1.00)
GFR, Estimated: >60 mL/min (ref >60)
Glucose, Bld: 113 mg/dL — ABNORMAL HIGH (ref 70–99)
Potassium: 3.5 mmol/L (ref 3.5–5.1)
Sodium: 138 mmol/L (ref 135–145)
Total Bilirubin: 0.7 mg/dL (ref 0.3–1.2)
Total Protein: 6.6 g/dL (ref 6.5–8.1)

## 2023-02-08 LAB — CBC
HCT: 38.6 % (ref 36.0–46.0)
Hemoglobin: 13 g/dL (ref 12.0–15.0)
MCH: 30.2 pg (ref 26.0–34.0)
MCHC: 33.7 g/dL (ref 30.0–36.0)
MCV: 89.8 fL (ref 80.0–100.0)
Platelets: 179 10*3/uL (ref 150–400)
RBC: 4.3 MIL/uL (ref 3.87–5.11)
RDW: 13.8 % (ref 11.5–15.5)
WBC: 6.3 10*3/uL (ref 4.0–10.5)
nRBC: 0 % (ref 0.0–0.2)

## 2023-02-08 LAB — ECHOCARDIOGRAM COMPLETE
AR max vel: 1.78 cm2
AV Area VTI: 1.73 cm2
AV Area mean vel: 1.74 cm2
AV Mean grad: 5 mmHg
AV Peak grad: 8.5 mmHg
Ao pk vel: 1.46 m/s
Area-P 1/2: 4.06 cm2
Height: 66 in
S' Lateral: 2.6 cm
Weight: 2694.9 oz

## 2023-02-08 LAB — RAPID URINE DRUG SCREEN, HOSP PERFORMED
Amphetamines: NOT DETECTED
Barbiturates: NOT DETECTED
Benzodiazepines: NOT DETECTED
Cocaine: NOT DETECTED
Opiates: NOT DETECTED
Tetrahydrocannabinol: NOT DETECTED

## 2023-02-08 LAB — HEMOGLOBIN A1C
Hgb A1c MFr Bld: 6.4 % — ABNORMAL HIGH (ref 4.8–5.6)
Mean Plasma Glucose: 137 mg/dL

## 2023-02-08 LAB — TROPONIN I (HIGH SENSITIVITY)
Troponin I (High Sensitivity): 14 ng/L (ref <18)
Troponin I (High Sensitivity): 8 ng/L (ref <18)

## 2023-02-08 LAB — PROTIME-INR
INR: 1.1 (ref 0.8–1.2)
Prothrombin Time: 13.6 seconds (ref 11.4–15.2)

## 2023-02-08 LAB — MAGNESIUM
Magnesium: 2.3 mg/dL (ref 1.7–2.4)
Magnesium: 2.2 mg/dL (ref 1.7–2.4)

## 2023-02-08 LAB — CBG MONITORING, ED
Glucose-Capillary: 133 mg/dL — ABNORMAL HIGH (ref 70–99)
Glucose-Capillary: 136 mg/dL — ABNORMAL HIGH (ref 70–99)

## 2023-02-08 LAB — CK: Total CK: 125 U/L (ref 38–234)

## 2023-02-08 LAB — LACTIC ACID, PLASMA: Lactic Acid, Venous: 1.3 mmol/L (ref 0.5–1.9)

## 2023-02-08 LAB — PHOSPHORUS
Phosphorus: 3.1 mg/dL (ref 2.5–4.6)
Phosphorus: 3.4 mg/dL (ref 2.5–4.6)

## 2023-02-08 LAB — TSH: TSH: 1.507 u[IU]/mL (ref 0.350–4.500)

## 2023-02-08 LAB — GLUCOSE, CAPILLARY
Glucose-Capillary: 107 mg/dL — ABNORMAL HIGH (ref 70–99)
Glucose-Capillary: 99 mg/dL (ref 70–99)

## 2023-02-08 LAB — HIV ANTIBODY (ROUTINE TESTING W REFLEX): HIV Screen 4th Generation wRfx: NONREACTIVE

## 2023-02-08 LAB — PREALBUMIN: Prealbumin: 26 mg/dL (ref 18–38)

## 2023-02-08 LAB — MRSA NEXT GEN BY PCR, NASAL: MRSA by PCR Next Gen: NOT DETECTED

## 2023-02-08 LAB — D-DIMER, QUANTITATIVE: D-Dimer, Quant: 0.28 ug/mL-FEU (ref 0.00–0.50)

## 2023-02-08 MED ORDER — ACETAMINOPHEN 325 MG PO TABS: 650.0000 mg | ORAL_TABLET | Freq: Four times a day (QID) | ORAL | Status: DC | PRN

## 2023-02-08 MED ORDER — FERROUS SULFATE 325 (65 FE) MG PO TABS
325.0000 mg | ORAL_TABLET | Freq: Every day | ORAL | Status: DC
  Administered 2023-02-08: 325 mg via ORAL
  Filled 2023-02-08: qty 1

## 2023-02-08 MED ORDER — CARVEDILOL 6.25 MG PO TABS
6.2500 mg | ORAL_TABLET | Freq: Two times a day (BID) | ORAL | 11 refills | Status: DC
  Filled 2023-02-08: qty 60, 30d supply, fill #0

## 2023-02-08 MED ORDER — ACETAMINOPHEN 650 MG RE SUPP: 650.0000 mg | Freq: Four times a day (QID) | RECTAL | Status: DC | PRN

## 2023-02-08 MED ORDER — ASPIRIN 81 MG PO TBEC: 81.0000 mg | DELAYED_RELEASE_TABLET | Freq: Every day | ORAL | Status: DC

## 2023-02-08 MED ORDER — ONDANSETRON HCL 4 MG/2ML IJ SOLN: 4.0000 mg | Freq: Four times a day (QID) | INTRAMUSCULAR | Status: DC | PRN

## 2023-02-08 MED ORDER — SODIUM CHLORIDE 0.9% FLUSH: 3.0000 mL | INTRAVENOUS | Status: DC | PRN

## 2023-02-08 MED ORDER — ONDANSETRON HCL 4 MG PO TABS: 4.0000 mg | ORAL_TABLET | Freq: Four times a day (QID) | ORAL | Status: DC | PRN

## 2023-02-08 MED ORDER — ISOSORBIDE MONONITRATE ER 30 MG PO TB24
30.0000 mg | ORAL_TABLET | Freq: Every day | ORAL | Status: DC
  Administered 2023-02-08: 30 mg via ORAL
  Filled 2023-02-08: qty 2
  Filled 2023-02-08: qty 1

## 2023-02-08 MED ORDER — SODIUM CHLORIDE 0.9 % IV SOLN: 250.0000 mL | INTRAVENOUS | Status: DC | PRN

## 2023-02-08 MED ORDER — SODIUM CHLORIDE 0.9% FLUSH
3.0000 mL | Freq: Two times a day (BID) | INTRAVENOUS | Status: DC
  Administered 2023-02-08 (×2): 3 mL via INTRAVENOUS

## 2023-02-08 NOTE — Assessment & Plan Note (Signed)
Somewhat atypical been ongoing for the past 7 hours with negative troponin and no EKG changes to suggest of acute ischemia.  Could be related to uncontrolled hypertension in the setting of patient stopping her home medications. Will improve blood pressure control patient will need to be reestablished care with cardiology Obtain echogram if abnormal will need inpatient cardiology consult make sure on daily aspirin continue to cycle cardiac enzymes and obtain serial EKG monitor on telemetry obtain lipid panel

## 2023-02-08 NOTE — Assessment & Plan Note (Signed)
Patient at this point denies that she is actually diabetic Will monitor/on sliding scale Patient no longer takes any medications

## 2023-02-08 NOTE — Assessment & Plan Note (Signed)
For right now restart Imdur 10 g p.o. twice daily will need to evaluate with medications patient is supposed to be taking
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
## 2023-02-08 NOTE — Assessment & Plan Note (Signed)
Will replace and check magnesium level ?

## 2023-02-08 NOTE — Progress Notes (Signed)
Echocardiogram 2D Echocardiogram has been performed.  Virginia [name] 02/08/2023, 9:22 AM
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
## 2023-02-09 ENCOUNTER — Telehealth: Payer: Self-pay

## 2023-02-09 NOTE — Transitions of Care (Post Inpatient/ED Visit) (Signed)
   02/09/2023  Name: BENAY SZUCH MRN: [id number] DOB: 01-08-1961  Today's TOC FU Call Status: Today's TOC FU Call Status:: Successful TOC FU Call Competed TOC FU Call Complete Date: 02/09/23  Transition Care Management Follow-up Telephone Call Date of Discharge: 02/08/23 Discharge Facility: [facility]) Type of Discharge: Inpatient Admission Primary Inpatient Discharge Diagnosis:: Noncardiac chest pain How have you been since you were released from the hospital?: Better Any questions or concerns?: No  Items Reviewed: Did you receive and understand the discharge instructions provided?: Yes Medications obtained and verified?: Yes (Medications Reviewed) Any new allergies since your discharge?: No Dietary orders reviewed?: NA Do you have support at home?: Yes People in Home: significant other  Home Care and Equipment/Supplies: [place] Ordered?: NA Any new equipment or medical supplies ordered?: NA  Functional Questionnaire: Do you need assistance with bathing/showering or dressing?: No Do you need assistance with meal preparation?: No Do you need assistance with eating?: No Do you have difficulty maintaining continence: No Do you need assistance with getting out of bed/getting out of a chair/moving?: No Do you have difficulty managing or taking your medications?: No  Follow up appointments reviewed: PCP Follow-up appointment confirmed?: Yes Date of PCP follow-up appointment?: 02/13/23 Follow-up Provider: Renee Rival, [facility] Follow-up appointment confirmed?: NA Do you need transportation to your follow-up appointment?: No Do you understand care options if your condition(s) worsen?: Yes-patient verbalized understanding    Norton Blizzard, [place] ([place])  [place] [phone]

## 2023-02-13 ENCOUNTER — Ambulatory Visit: Payer: Self-pay | Admitting: Nurse Practitioner

## 2023-02-20 ENCOUNTER — Ambulatory Visit: Payer: Self-pay | Admitting: Nurse Practitioner

## 2023-03-29 ENCOUNTER — Telehealth: Payer: Self-pay | Admitting: Family Medicine

## 2023-03-29 NOTE — Telephone Encounter (Signed)
Received vm from [facility] Devona Konig Farrin checking status of their request for records. IC,lmvm advised records faxed 5/16 and I refaxed today, receiving confirmation. Fax number [phone], callback [phone]

## 2024-04-04 DIAGNOSIS — G459 Transient cerebral ischemic attack, unspecified: Secondary | ICD-10-CM | POA: Insufficient documentation

## 2024-04-10 NOTE — Discharge Summary (Signed)
 ------------------------------------------------------------------------------- Attestation signed by Elston KATHEE Naegeli, MD at 04/10/24 1502 Patient seen by my colleague, Renda Jacquet ACNP. We have discussed the case together. I personally interpreted pertinent imaging studies and labwork, and reviewed her documentation. I agree with documentation.     Elston Naegeli, MD Neurologist [facility] 04/10/2024 3:02 PM  -------------------------------------------------------------------------------  [facility] Stroke Discharge Summary  PCP: Carola Mace, PA-C Discharge Details   Admit date:         04/02/2024 Discharge date:         04/10/2024  Hospital LOS:    6 days  Discharge Diagnosis   Active Hospital Problems   Diagnosis Date Noted POA  . *TIA involving left internal carotid artery 04/03/2024 Yes  . TIA (transient ischemic attack) 04/04/2024 Yes  . Weakness 04/02/2024 Yes  . Hyperlipidemia with target LDL less than 70 04/02/2024 Yes  . Type 2 diabetes mellitus without complication, without long-term current use of insulin  (*) 04/27/2023 Yes  . Hypertension associated with diabetes (*) 04/27/2023 Yes    Resolved Hospital Problems  No resolved problems to display.      Assessment: TIA   - Acute Treatment Provided: Did not receive TPA/Thrombectomy - Etiology: Intracranial Atherosclerotic Disease - Risk Factors: Hypertension, Hyperlipidemia, Diabetes, Coronary Artery Disease, Carotid Disease, and Obesity  - Secondary Prevention on Discharge: Dual Antiplatelet Therapy for 21 Days and Risk Factor Modification - Initial NIHSS: 6  - Discharge NIHSS: 5  - PHQ9: 0  - Discharge Modified Rankin Scale 3 (Moderate disability, requiring some help, but able to walk without assistance)   Stroke Workup This Admission:  - TTE 5/28: EF 55-60%, normal LV wall motion, no significant valvular  abnormality - CTA H/N 5/27: occlusion of right PCA distal P1, mod stenosis involving left proximal P2, poor flow and suspected mod to severe stenosis involving left A3, mild to mod calcified plaque involving left cervical ICA w/mild stenosis  - MRI brain 5/28: no acute intracranial findings   - LDL: 124  - A1c: 6.8   Long-Term Secondary Stroke Prevention Goals & Recommendations: -Goal blood pressure < 130/80 mmHg  -Goal LDL < 70 -Goal Hemoglobin J8r < 7  -Smoking Cessation  -Medication Compliance  -Regular Exercise (30 minutes per day, five days per week) -PT/OT Recommendations: SNF   Discharge Medications     Current Discharge Medication List     START taking these medications      Details  ezetimibe  10 MG tablet Commonly known as: ZETIA   Take one tablet (10 mg dose) by mouth at bedtime. Quantity: 30 tablet   ticagrelor  90 mg tablet Commonly known as: BRILINTA   Take one tablet (90 mg dose) by mouth 2 (two) times daily for 21 days. Quantity: 42 tablet       CONTINUE these medications which have NOT CHANGED      Details  amLODIPine  besylate 5 mg tablet Commonly known as: NORVASC   Take one tablet (5 mg dose) by mouth daily. Quantity: 30 tablet   aspirin  EC tablet Commonly known as: ECOTRIN LOW DOSE  Take one tablet (81 mg dose) by mouth daily.   isosorbide  mononitrate 30 mg 24 hr tablet Commonly known as: IMDUR   Take one tablet (30 mg dose) by mouth daily. Quantity: 30 tablet   losartan  potassium 50 mg tablet Commonly known as: COZAAR   Take one tablet (50 mg dose) by mouth daily. Quantity: 30 tablet   nitroGLYCERIN  0.4 mg SL tablet  Commonly known as: NITROSTAT   Place one tablet (0.4 mg dose) under the tongue every 5 (five) minutes as needed for Chest pain. Quantity: 100 tablet   pantoprazole  sodium 20 mg tablet Commonly known as: PROTONIX   TAKE 1 TABLET(20 MG) BY MOUTH DAILY Quantity: 30 tablet   rosuvastatin  calcium  40 mg tablet Commonly known as:  CRESTOR   Take one tablet (40 mg dose) by mouth daily. Quantity: 90 tablet      * You might also be taking other medications not listed above. If you have questions about any of your other medications, talk to the person who prescribed them or your Primary Care Provider.          STOP taking these medications    diclofenac sodium 1 % gel Commonly known as: VOLTAREN        Physical Exam  BP 144/75 (BP Location: Right Upper Arm, Patient Position: Lying)   Pulse 69   Temp 98.3 F (36.8 C) (Oral)   Resp 18   Ht 1.676 m (5' 6)   Wt 90.4 kg (199 lb 4.7 oz)   SpO2 100%   BMI 32.17 kg/m   Discharge NIH Stroke Scale 1a. Level of Consciousness 0-Alert 1b. Level of Consciousness Questions 0-Answers both questions correctly 1c. Level of Consciousness Commands 0-Performs both tasks correctly 2. Best Gaze 0-Normal 3. Visual 0-No visual loss 4. Facial Palsy 0-Normal 5a. Motor Arm Left 0-No drift 5b. Motor Arm Right 2-Some effort against gravity but hits bed 6a. Motor Leg Left 0-No drift 6b. Motor Leg Right 2-Some effort against gravity but hits bed 7. Ataxia 0-Absent 8. Sensory 1-Mild to moderate sensory loss 9. Best Language 0-No aphasia 10. Dysarthria 0-Normal 11. Extinction and Inattention 0-No abnormality  NIHSS Total: 5    Discharge Examination  Mental Status: Orientation: Oriented to person, place, time, and situation  Attention/Concentration: Intact Fund of Knowledge: Intact Language: Intact fluency, Intact comprehension, Intact repetition, Intact naming, and Intact reading Speech: Without Dysarthria    Cranial Nerves:  Pupils: Equal, OD Pupil 3 mm, OS Pupil 3 mm, and Reactive  Visual Fields: (R) Full to confrontation; (L) Full to confrontation CN III, IV, VI (Extra-Ocular Movements): EOMI CN V: Normal sensation in V1, V2, V3 bilaterally CN VII: Normal, symmetric facial muscle strength bilaterally CN VIII: Auditory acuity intact to bedside testing CN IX/X:  Normal palate elevation CN XI: Normal strength of bilateral trapezius and SCM muscles  CN XII: Full strength B/L with tongue-in-cheek testing  Motor:  Strength: RUE/RLE proximal weakness; 4+/5 strength left hemibody Bulk:: Age-Appropriate atrophy  Tone: Normal in the upper and lower extremities Involuntary Movements (asterixis, tremor, etc): Absent  Pronator Drift: Absent  Sensory: Light Touch: Impaired on the right hemi-body - specifically RLE  Extinction to double simultaneous stimulation    Reflexes:  Plantar Response: Not tested  Coordination: Finger to Nose: No dysmetria Heel to Shin: No dysmetria  Gait: Ambulating with walker  Labs   Recent Results (from the past 24 hours)  CBC   Collection Time: 04/10/24  2:14 AM  Result Value Ref Range   WBC 6.2 4.0 - 10.5 thou/mcL   RBC 4.21 3.93 - 5.22 million/mcL   HGB 12.3 11.2 - 15.7 gm/dL   HCT 61.6 65.8 - 55.0 %   MCV 91.0 79.4 - 94.8 fL   MCH 29.2 25.6 - 32.2 pg   MCHC 32.1 (L) 32.2 - 35.5 gm/dL   Plt Ct 821 849 - 599 thou/mcL   RDW SD 46.3 35.1 -  46.3 fL   MPV 10.6 9.4 - 12.4 fL   NRBC% 0.0 0.0 - 0.2 /100WBC   Absolute NRBC Count 0.00 0.00 - 0.01 thou/mcL  Comprehensive Metabolic Panel   Collection Time: 04/10/24  2:14 AM  Result Value Ref Range   Na 141 136 - 146 mmol/L   Potassium 4.2 3.7 - 5.4 mmol/L   Cl 108 97 - 108 mmol/L   CO2 22 20 - 32 mmol/L   AGAP 11 7 - 16 mmol/L   Glucose 127 (H) 65 - 99 mg/dL   BUN 17 8 - 27 mg/dL   Creatinine 8.94 (H) 9.42 - 1.00 mg/dL   Ca 9.3 8.6 - 89.7 mg/dL   ALK PHOS 888 25 - 834 U/L   T Bili 0.3 0.0 - 1.2 mg/dL   Total Protein 6.9 6.0 - 8.5 gm/dL   Alb 3.8 3.6 - 4.8 gm/dL   GLOBULIN 3.1 1.5 - 4.5 gm/dL   ALBUMIN/GLOBULIN RATIO 1.2 1.1 - 2.5   BUN/CREAT RATIO 16.2 11.0 - 26.0   ALT 18 0 - 40 U/L   AST 19 0 - 40 U/L   eGFR 59 (L) >=60 mL/min/1.32m2    Last Resulted Components     Date/Time Component Value Flag Units Reference Range Lab Status   04/03/24 1055  CHOL 195 -- mg/dL 899 - 800 mg/dL Final result   94/71/74 1055 HDL 51 -- mg/dL >=60 mg/dL Final result   94/71/74 1055 LDL 124* High mg/dL 0 - 99 mg/dL Final result   94/71/74 1055 TRIG 98 -- mg/dL 0 - 850 mg/dL Final result   94/71/74 1055 HGBA1C 6.8* High % 4.8 - 5.6 % Final result   04/10/24 0214 GLUCOSE 127* High mg/dL 65 - 99 mg/dL Final result       Discharge Instructions  Discharge Teaching Handouts: TIA  Activity after leaving the hospital: Progressive activity as tolerated  Diet: Cardiac Prudent and Consistent Carb  Call EMS for any signs or symptoms of a stroke; Education provided   Follow-up appointments: Follow-up with Carola Mace, PA-C in 7-10 days. Follow up in stroke bridge clinic    Hospital Course  Ms. Kizzi Overbey is a 63 year old female with a past medical history of hypertension, hyperlipidemia, DM type II, weakness, and CAD who presented to [facility] ED with acute onset blurred vision, bifrontal headache and right side (face/arm/leg) weakness.  Transferred to [facility] for stroke evaluation.  A CT head was without acute abnormality, CTA head and neck with chronic appearing right P1 atherosclerotic occlusion.  CTP without evidence of stroke.  No TNK for duration of symptoms, IR deferred with no LVO on imaging.     During hospitalization, Ms. Lesnick completed an MRI which was negative for acute stroke.  Workup ongoing for TIA.  Several vascular risk factors noted, started on DAPT with plavix-noted to be a non-responder and transition to Brilinta  BID/ASA.  Right side weakness of arm and leg improving slightly per day, more proximal weakness/heaviness reported.  Headache now resolved/improved.  Reports that Zetia  has caused a worsened headache and after education regarding hyperlipidemia therapy, Ms. Otterson agreed to continue Crestor  and would like a repeat Lipid Panel as an outpatient before determining if an additional medication would work best.  PT/OT following with current  recommendation for SNF.  Ms. Rossa remains appropriate for discharge to [facility] facility.  Family to provide transport this afternoon.     Time spent in discharge process: 36 minutes  Dr. Ray has seen  and examined the patient and formulated the plan personally.  The note was written for Dr. Ray by Renda Jacquet, NP on 04/10/2024 1:34 PM   Electronically signed:  Renda JAYSON Jacquet, ACNP 04/10/2024 / 1:34 PM *Some images could not be shown.

## 2024-05-02 ENCOUNTER — Telehealth: Payer: Self-pay

## 2024-05-02 NOTE — Transitions of Care (Post Inpatient/ED Visit) (Signed)
   05/02/2024  Name: Virginia [name] MRN: [id number] DOB: 21-Aug-1961  Today's TOC FU Call Status: Today's TOC FU Call Status:: Unsuccessful Call (1st Attempt) Unsuccessful Call (1st Attempt) Date: 05/02/24  Attempted to reach the patient regarding the most recent Inpatient/ED visit.  Follow Up Plan: Additional outreach attempts will be made to reach the patient to complete the Transitions of Care (Post Inpatient/ED visit) call.   Signature Julian Lemmings, LPN [facility] Nurse Health Advisor Direct Dial [phone]

## 2024-05-03 NOTE — Transitions of Care (Post Inpatient/ED Visit) (Unsigned)
   05/03/2024  Name: LATERA MCLIN MRN: [id number] DOB: [birth year]  Today's TOC FU Call Status: Today's TOC FU Call Status:: Unsuccessful Call (2nd Attempt) Unsuccessful Call (1st Attempt) Date: 05/02/24 Unsuccessful Call (2nd Attempt) Date: 05/03/24  Attempted to reach the patient regarding the most recent Inpatient/ED visit.  Follow Up Plan: Additional outreach attempts will be made to reach the patient to complete the Transitions of Care (Post Inpatient/ED visit) call.   Signature Julian Lemmings, LPN [facility] Nurse Health Advisor Direct Dial [phone]

## 2024-05-06 NOTE — Transitions of Care (Post Inpatient/ED Visit) (Signed)
   05/06/2024  Name: Virginia [name] MRN: [id number] DOB: 1961-09-25  Today's TOC FU Call Status: Today's TOC FU Call Status:: Unsuccessful Call (3rd Attempt) Unsuccessful Call (1st Attempt) Date: 05/02/24 Unsuccessful Call (2nd Attempt) Date: 05/03/24 Unsuccessful Call (3rd Attempt) Date: 05/06/24  Attempted to reach the patient regarding the most recent Inpatient/ED visit.  Follow Up Plan: No further outreach attempts will be made at this time. We have been unable to contact the patient.  Signature Julian Lemmings, LPN [facility] Nurse Health Advisor Direct Dial [phone]

## 2024-05-26 ENCOUNTER — Encounter (HOSPITAL_COMMUNITY): Payer: Self-pay

## 2024-05-26 ENCOUNTER — Other Ambulatory Visit: Payer: Self-pay

## 2024-05-26 ENCOUNTER — Emergency Department (HOSPITAL_COMMUNITY)

## 2024-05-26 ENCOUNTER — Inpatient Hospital Stay (HOSPITAL_COMMUNITY)
Admission: EM | Admit: 2024-05-26 | Discharge: 2024-05-28 | DRG: 322 | Disposition: A | Discharge status: Home / Self Care | Attending: Cardiology | Admitting: Cardiology

## 2024-05-26 DIAGNOSIS — Z8673 Personal history of transient ischemic attack (TIA), and cerebral infarction without residual deficits: Secondary | ICD-10-CM

## 2024-05-26 DIAGNOSIS — I2510 Atherosclerotic heart disease of native coronary artery without angina pectoris: Secondary | ICD-10-CM

## 2024-05-26 DIAGNOSIS — E7879 Other disorders of bile acid and cholesterol metabolism: Secondary | ICD-10-CM

## 2024-05-26 DIAGNOSIS — Z825 Family history of asthma and other chronic lower respiratory diseases: Secondary | ICD-10-CM | POA: Diagnosis not present

## 2024-05-26 DIAGNOSIS — I214 Non-ST elevation (NSTEMI) myocardial infarction: Principal | ICD-10-CM

## 2024-05-26 DIAGNOSIS — I200 Unstable angina: Secondary | ICD-10-CM | POA: Diagnosis not present

## 2024-05-26 DIAGNOSIS — Z6829 Body mass index (BMI) 29.0-29.9, adult: Secondary | ICD-10-CM | POA: Diagnosis not present

## 2024-05-26 DIAGNOSIS — E1122 Type 2 diabetes mellitus with diabetic chronic kidney disease: Secondary | ICD-10-CM | POA: Diagnosis present

## 2024-05-26 DIAGNOSIS — I10 Essential (primary) hypertension: Secondary | ICD-10-CM | POA: Diagnosis not present

## 2024-05-26 DIAGNOSIS — I129 Hypertensive chronic kidney disease with stage 1 through stage 4 chronic kidney disease, or unspecified chronic kidney disease: Secondary | ICD-10-CM | POA: Diagnosis present

## 2024-05-26 DIAGNOSIS — R079 Chest pain, unspecified: Secondary | ICD-10-CM | POA: Diagnosis not present

## 2024-05-26 DIAGNOSIS — E7841 Elevated Lipoprotein(a): Secondary | ICD-10-CM | POA: Diagnosis not present

## 2024-05-26 DIAGNOSIS — Z8349 Family history of other endocrine, nutritional and metabolic diseases: Secondary | ICD-10-CM | POA: Diagnosis not present

## 2024-05-26 DIAGNOSIS — Z7984 Long term (current) use of oral hypoglycemic drugs: Secondary | ICD-10-CM

## 2024-05-26 DIAGNOSIS — E7800 Pure hypercholesterolemia, unspecified: Secondary | ICD-10-CM | POA: Diagnosis not present

## 2024-05-26 DIAGNOSIS — Z79899 Other long term (current) drug therapy: Secondary | ICD-10-CM

## 2024-05-26 DIAGNOSIS — I25110 Atherosclerotic heart disease of native coronary artery with unstable angina pectoris: Secondary | ICD-10-CM | POA: Diagnosis present

## 2024-05-26 DIAGNOSIS — Z955 Presence of coronary angioplasty implant and graft: Secondary | ICD-10-CM

## 2024-05-26 DIAGNOSIS — E66811 Obesity, class 1: Secondary | ICD-10-CM | POA: Diagnosis present

## 2024-05-26 DIAGNOSIS — Z833 Family history of diabetes mellitus: Secondary | ICD-10-CM

## 2024-05-26 DIAGNOSIS — Z823 Family history of stroke: Secondary | ICD-10-CM | POA: Diagnosis not present

## 2024-05-26 DIAGNOSIS — I252 Old myocardial infarction: Secondary | ICD-10-CM | POA: Diagnosis not present

## 2024-05-26 DIAGNOSIS — E7801 Familial hypercholesterolemia: Secondary | ICD-10-CM | POA: Diagnosis present

## 2024-05-26 DIAGNOSIS — E119 Type 2 diabetes mellitus without complications: Secondary | ICD-10-CM

## 2024-05-26 DIAGNOSIS — N1831 Chronic kidney disease, stage 3a: Secondary | ICD-10-CM | POA: Diagnosis present

## 2024-05-26 DIAGNOSIS — E785 Hyperlipidemia, unspecified: Secondary | ICD-10-CM | POA: Diagnosis present

## 2024-05-26 DIAGNOSIS — Z7982 Long term (current) use of aspirin: Secondary | ICD-10-CM | POA: Diagnosis not present

## 2024-05-26 DIAGNOSIS — Z8249 Family history of ischemic heart disease and other diseases of the circulatory system: Secondary | ICD-10-CM | POA: Diagnosis not present

## 2024-05-26 LAB — BASIC METABOLIC PANEL WITH GFR
Anion gap: 11 (ref 5–15)
BUN: 7 mg/dL — ABNORMAL LOW (ref 8–23)
CO2: 22 mmol/L (ref 22–32)
Calcium: 9 mg/dL (ref 8.9–10.3)
Chloride: 104 mmol/L (ref 98–111)
Creatinine, Ser: 0.9 mg/dL (ref 0.44–1.00)
GFR, Estimated: >60 mL/min (ref >60)
Glucose, Bld: 106 mg/dL — ABNORMAL HIGH (ref 70–99)
Potassium: 4.4 mmol/L (ref 3.5–5.1)
Sodium: 137 mmol/L (ref 135–145)

## 2024-05-26 LAB — CBC
HCT: 39.4 % (ref 36.0–46.0)
Hemoglobin: 13.1 g/dL (ref 12.0–15.0)
MCH: 30.4 pg (ref 26.0–34.0)
MCHC: 33.2 g/dL (ref 30.0–36.0)
MCV: 91.4 fL (ref 80.0–100.0)
Platelets: 181 K/uL (ref 150–400)
RBC: 4.31 MIL/uL (ref 3.87–5.11)
RDW: 14.1 % (ref 11.5–15.5)
WBC: 6.9 K/uL (ref 4.0–10.5)
nRBC: 0 % (ref 0.0–0.2)

## 2024-05-26 LAB — HIV ANTIBODY (ROUTINE TESTING W REFLEX): HIV Screen 4th Generation wRfx: NONREACTIVE

## 2024-05-26 LAB — HEPATIC FUNCTION PANEL
ALT: 12 U/L (ref 0–44)
AST: 16 U/L (ref 15–41)
Albumin: 4 g/dL (ref 3.5–5.0)
Alkaline Phosphatase: 90 U/L (ref 38–126)
Bilirubin, Direct: <0.1 mg/dL (ref 0.0–0.2)
Total Bilirubin: 0.7 mg/dL (ref 0.0–1.2)
Total Protein: 7.5 g/dL (ref 6.5–8.1)

## 2024-05-26 LAB — MRSA NEXT GEN BY PCR, NASAL: MRSA by PCR Next Gen: NOT DETECTED

## 2024-05-26 LAB — TROPONIN I (HIGH SENSITIVITY)
Troponin I (High Sensitivity): 27 ng/L — ABNORMAL HIGH (ref <18)
Troponin I (High Sensitivity): 98 ng/L — ABNORMAL HIGH (ref <18)

## 2024-05-26 LAB — PROTIME-INR
INR: 1.1 (ref 0.8–1.2)
Prothrombin Time: 15.1 s (ref 11.4–15.2)

## 2024-05-26 LAB — HEMOGLOBIN A1C
Hgb A1c MFr Bld: 6.7 % — ABNORMAL HIGH (ref 4.8–5.6)
Hgb A1c MFr Bld: 6.6 % — ABNORMAL HIGH (ref 4.8–5.6)
Mean Plasma Glucose: 145.59 mg/dL
Mean Plasma Glucose: 142.72 mg/dL

## 2024-05-26 LAB — APTT
aPTT: >200 s (ref 24–36)
aPTT: 158 s — ABNORMAL HIGH (ref 24–36)

## 2024-05-26 LAB — LIPASE, BLOOD: Lipase: 33 U/L (ref 11–51)

## 2024-05-26 MED ORDER — ORAL CARE MOUTH RINSE
15.0000 mL | OROMUCOSAL | Status: DC | PRN
Start: 2024-05-26 — End: 2024-05-28

## 2024-05-26 MED ORDER — ASPIRIN 81 MG PO TBEC
81.0000 mg | DELAYED_RELEASE_TABLET | Freq: Every day | ORAL | Status: DC
  Administered 2024-05-28: 81 mg via ORAL
  Filled 2024-05-26: qty 1

## 2024-05-26 MED ORDER — HEPARIN SODIUM (PORCINE) 5000 UNIT/ML IJ SOLN: 60.0000 [IU]/kg | Freq: Once | INTRAMUSCULAR | Status: DC

## 2024-05-26 MED ORDER — ASPIRIN 81 MG PO CHEW
81.0000 mg | CHEWABLE_TABLET | ORAL | Status: AC
  Administered 2024-05-27: 81 mg via ORAL
  Filled 2024-05-26: qty 1

## 2024-05-26 MED ORDER — EZETIMIBE 10 MG PO TABS
10.0000 mg | ORAL_TABLET | Freq: Every day | ORAL | Status: DC
  Filled 2024-05-26: qty 1

## 2024-05-26 MED ORDER — HEPARIN BOLUS VIA INFUSION
4000.0000 [IU] | Freq: Once | INTRAVENOUS | Status: AC
  Administered 2024-05-26: 4000 [IU] via INTRAVENOUS
  Filled 2024-05-26: qty 4000

## 2024-05-26 MED ORDER — METOPROLOL SUCCINATE ER 25 MG PO TB24
12.5000 mg | ORAL_TABLET | Freq: Every day | ORAL | Status: DC
  Administered 2024-05-26 – 2024-05-28 (×3): 12.5 mg via ORAL
  Filled 2024-05-26 (×3): qty 1

## 2024-05-26 MED ORDER — ACETAMINOPHEN 325 MG PO TABS: 650.0000 mg | ORAL_TABLET | ORAL | Status: DC | PRN

## 2024-05-26 MED ORDER — PANTOPRAZOLE SODIUM 40 MG PO TBEC
40.0000 mg | DELAYED_RELEASE_TABLET | Freq: Every day | ORAL | Status: DC
  Administered 2024-05-26 – 2024-05-28 (×3): 40 mg via ORAL
  Filled 2024-05-26 (×3): qty 1

## 2024-05-26 MED ORDER — AMLODIPINE BESYLATE 5 MG PO TABS
5.0000 mg | ORAL_TABLET | Freq: Every day | ORAL | Status: DC
  Administered 2024-05-26 – 2024-05-28 (×3): 5 mg via ORAL
  Filled 2024-05-26 (×3): qty 1

## 2024-05-26 MED ORDER — ROSUVASTATIN CALCIUM 20 MG PO TABS
40.0000 mg | ORAL_TABLET | Freq: Every day | ORAL | Status: DC
  Administered 2024-05-26 – 2024-05-28 (×3): 40 mg via ORAL
  Filled 2024-05-26 (×3): qty 2

## 2024-05-26 MED ORDER — EZETIMIBE 10 MG PO TABS
10.0000 mg | ORAL_TABLET | Freq: Every day | ORAL | Status: DC
  Administered 2024-05-26 – 2024-05-27 (×2): 10 mg via ORAL
  Filled 2024-05-26 (×4): qty 1

## 2024-05-26 MED ORDER — SODIUM CHLORIDE 0.9 % IV SOLN: INTRAVENOUS | Status: DC

## 2024-05-26 MED ORDER — FERROUS SULFATE 325 (65 FE) MG PO TABS
325.0000 mg | ORAL_TABLET | Freq: Every day | ORAL | Status: DC
  Administered 2024-05-28: 325 mg via ORAL
  Filled 2024-05-26: qty 1

## 2024-05-26 MED ORDER — NITROGLYCERIN IN D5W 200-5 MCG/ML-% IV SOLN
0.0000 ug/min | INTRAVENOUS | Status: DC
  Administered 2024-05-27: 5 ug/min via INTRAVENOUS
  Filled 2024-05-26: qty 250

## 2024-05-26 MED ORDER — LOSARTAN POTASSIUM 50 MG PO TABS
50.0000 mg | ORAL_TABLET | Freq: Every day | ORAL | Status: DC
  Administered 2024-05-26 – 2024-05-28 (×3): 50 mg via ORAL
  Filled 2024-05-26 (×3): qty 1

## 2024-05-26 MED ORDER — HEPARIN (PORCINE) 25000 UT/250ML-% IV SOLN
950.0000 [IU]/h | INTRAVENOUS | Status: DC
  Administered 2024-05-26: 950 [IU]/h via INTRAVENOUS
  Filled 2024-05-26: qty 250

## 2024-05-26 MED ORDER — SODIUM CHLORIDE 0.9 % WEIGHT BASED INFUSION
1.5000 mL/kg/h | INTRAVENOUS | Status: DC
  Administered 2024-05-27: 1 mL/kg/h via INTRAVENOUS

## 2024-05-26 MED ORDER — SODIUM CHLORIDE 0.9 % WEIGHT BASED INFUSION
3.0000 mL/kg/h | INTRAVENOUS | Status: DC
  Administered 2024-05-27: 3 mL/kg/h via INTRAVENOUS

## 2024-05-26 NOTE — ED Notes (Signed)
 CCMD called, pt on monitor

## 2024-05-26 NOTE — ED Provider Notes (Signed)
 [place] EMERGENCY DEPARTMENT AT [facility] Provider Note   CSN: [id number] Arrival date & time: 05/26/24  1213     Patient presents with: Chest Pain   Virginia [name] is a 63 y.o. female.   HPI Patient reports having had a heart attack 2 years ago.  She reports this morning she awakened feeling really sweaty and weak.  She reports after that symptoms she started developing chest pressure and had a burning and pressure quality.  She reports it felt similar to her heart attack before.  Patient reports that symptoms have been going on for a number of hours since this morning.  At home she tried 2 nitroglycerin  and did not get much relief.  When she called EMS she got 4 aspirin  and another nitroglycerin  which improved the pain down to about a 3 or 4 from a 9 at its worst.     Prior to Admission medications   Medication Sig Start Date End Date Taking? Authorizing Provider  acetaminophen  (TYLENOL ) 500 MG tablet Take 1 tablet (500 mg total) by mouth every 6 (six) hours as needed. Patient taking differently: Take 500 mg by mouth every 6 (six) hours as needed for mild pain. 04/27/21   Tilford Bertram HERO, FNP  aspirin  EC 81 MG tablet Take 1 tablet (81 mg total) by mouth daily. 09/27/19   Stroud, Natalie M, FNP  carvedilol  (COREG ) 6.25 MG tablet Take 1 tablet (6.25 mg total) by mouth 2 (two) times daily. 02/08/23 02/08/24  Jonel Lonni SQUIBB, MD  ferrous sulfate  (FEROSUL) 325 (65 FE) MG tablet Take 1 tablet (325 mg total) by mouth daily with breakfast. 04/09/21   Myrna Camelia HERO, NP  omeprazole  (PRILOSEC) 20 MG capsule Take 1 capsule (20 mg total) by mouth daily. 04/09/21   Myrna Camelia HERO, NP  isosorbide  mononitrate (IMDUR ) 30 MG 24 hr tablet TAKE 1 TABLET (30 MG TOTAL) BY MOUTH DAILY. 11/26/20 06/09/21  Jerrie Anger, PA  lisinopril  (ZESTRIL ) 2.5 MG tablet Take 1 tablet (2.5 mg total) by mouth daily. 10/13/20 11/26/20  Stroud, Natalie M, FNP  metoprolol  tartrate (LOPRESSOR ) 25 MG tablet  Take 0.5 tablets (12.5 mg total) by mouth 2 (two) times daily. 10/13/20 11/26/20  Stroud, Natalie M, FNP    Allergies: Patient has no known allergies.    Review of Systems  Updated Vital Signs BP (!) 146/75   Pulse 73   Temp 98.2 F (36.8 C) (Oral)   Resp 18   LMP 12/29/2015   SpO2 100%   Physical Exam Constitutional:      Comments: Alert nontoxic no acute distress.  HENT:     Mouth/Throat:     Pharynx: Oropharynx is clear.  Eyes:     Extraocular Movements: Extraocular movements intact.  Cardiovascular:     Rate and Rhythm: Normal rate and regular rhythm.  Pulmonary:     Effort: Pulmonary effort is normal.     Breath sounds: Normal breath sounds.  Abdominal:     General: There is no distension.     Palpations: Abdomen is soft.     Tenderness: There is no abdominal tenderness. There is no guarding.  Musculoskeletal:        General: No swelling or tenderness. Normal range of motion.     Right lower leg: No edema.     Left lower leg: No edema.  Skin:    General: Skin is warm and dry.  Neurological:     General: No focal deficit present.  Mental Status: She is oriented to person, place, and time.     Motor: No weakness.     Coordination: Coordination normal.  Psychiatric:        Mood and Affect: Mood normal.   Stop his goals to now 66 to 60 minutes.  (all labs ordered are listed, but only abnormal results are displayed) Labs Reviewed  BASIC METABOLIC PANEL WITH GFR - Abnormal; Notable for the following components:      Result Value   Glucose, Bld 106 (*)    BUN 7 (*)    All other components within normal limits  TROPONIN I (HIGH SENSITIVITY) - Abnormal; Notable for the following components:   Troponin I (High Sensitivity) 27 (*)    All other components within normal limits  CBC  APTT  HEMOGLOBIN A1C  HEPATIC FUNCTION PANEL  LIPASE, BLOOD  LIPID PANEL  PROTIME-INR  I-STAT CG4 LACTIC ACID, ED  TROPONIN I (HIGH SENSITIVITY)    EKG: EKG  Interpretation Date/Time:  Sunday May 26 2024 12:19:22 EDT Ventricular Rate:  88 PR Interval:  151 QRS Duration:  81 QT Interval:  374 QTC Calculation: 453 R Axis:   60  Text Interpretation: Sinus rhythm Abnormal R-wave progression, early transition Borderline ST depression, diffuse leads agree, no STEMI Confirmed by Armenta Canning [phone]) on 05/26/2024 2:50:27 PM  Radiology: No results found.   Procedures  CRITICAL CARE Performed by: Canning Armenta   Total critical care time: 30 minutes  Critical care time was exclusive of separately billable procedures and treating other patients.  Critical care was necessary to treat or prevent imminent or life-threatening deterioration.  Critical care was time spent personally by me on the following activities: development of treatment plan with patient and/or surrogate as well as nursing, discussions with consultants, evaluation of patient's response to treatment, examination of patient, obtaining history from patient or surrogate, ordering and performing treatments and interventions, ordering and review of laboratory studies, ordering and review of radiographic studies, pulse oximetry and re-evaluation of patient's condition.  Medications Ordered in the ED  0.9 %  sodium chloride  infusion (has no administration in time range)  heparin  injection 60 Units/kg (has no administration in time range)                                    Medical Decision Making Amount and/or Complexity of Data Reviewed Labs: ordered.  Patient presents with chest pain this morning and going on through this afternoon.  Patient has known history of coronary artery disease with prior NSTEMI.  High risk for ischemic disease.  First EKG reviewed from EMS does not show acute elevation pattern present.  Patient is vital signs are stable.  She had a recent TIA admission at [facility] and was discharged on 6\29 with aspirin  and Brilinta  therapy.  Troponin 27 CBC normal basic  metabolic panel normal  Repeat EKG reviewed by myself shows more dynamic anterior T wave changes with subtle ST elevation.  15: 10 consult reviewed with Dr. Ladona for unstable angina and dynamic EKG changes.  Will see the patient in the emergency department and decide if she is candidate for Cath Lab.  Patient updated on plan.  Heparin  will be initiated.  Patient remains alert and stable in appearance with stable vital signs.     Final diagnoses:  Unstable angina [facility])    ED Discharge Orders     None  Armenta Canning, MD 05/26/24 [phone]

## 2024-05-26 NOTE — Progress Notes (Signed)
 ANTICOAGULATION CONSULT NOTE  Pharmacy Consult for Heparin  Indication: chest pain/ACS  No Known Allergies  Patient Measurements:   Heparin  Dosing Weight: 79 kg  Vital Signs: Temp: 98.2 F (36.8 C) (07/20 1225) Temp Source: Oral (07/20 1225) BP: 146/75 (07/20 1405) Pulse Rate: 73 (07/20 1225)  Labs: Recent Labs    05/26/24 1227  HGB 13.1  HCT 39.4  PLT 181  CREATININE 0.90  TROPONINIHS 27*    CrCl cannot be calculated (Unknown ideal weight.).   Medical History: Past Medical History:  Diagnosis Date   Anemia    CAD (coronary artery disease)    a. 06/2015 BMS x2 to LCx. b. NSTEMI 11/2020 s/p DES to prox LAD; residual disease treated medically.   CKD (chronic kidney disease), stage II    Diabetes mellitus (HCC)    GERD (gastroesophageal reflux disease)    ?   GI bleeding    2016 - bleeding polyp clipped   Hyperlipidemia with target LDL less than 70    Hypertension    STEMI (ST elevation myocardial infarction) (HCC) 06/16/2015   BMS x 2 CFX   Thyroid  nodule    benign    Medications:  (Not in a hospital admission)  Scheduled:  Infusions:   sodium chloride      PRN:   Assessment: 27 yof with a history of CAD w/ NSTEMI. Patient is presenting with chest pain. Heparin  per pharmacy consult placed for chest pain/ACS.  Patient is not on anticoagulation prior to arrival.  Hgb 13.1; plt 181 hsTrop 27 > 98  Goal of Therapy:  Heparin  level 0.3-0.7 units/ml Monitor platelets by anticoagulation protocol: Yes   Plan:  Give IV heparin  4000 units bolus x 1 Start heparin  infusion at 950 units/hr Check anti-Xa level at 2300 and daily while on heparin  Continue to monitor H&H and platelets  Dorn Buttner, PharmD, BCPS 05/26/2024 3:16 PM ED Clinical Pharmacist -  [phone]

## 2024-05-26 NOTE — ED Provider Triage Note (Signed)
 Emergency Medicine Provider Triage Evaluation Note  Virginia [name] , a 63 y.o. female  was evaluated in triage.  Pt complains of chest pain that started earlier today.  Associate with shortness of breath.  History of cardiac disease.  Review of Systems  Positive:  Negative:   Physical Exam  BP (!) 143/83 (BP Location: Right Arm)   Pulse 73   Temp 98.2 F (36.8 C) (Oral)   Resp 18   LMP 12/29/2015   SpO2 100%  Gen:   Awake, no distress   Resp:  Normal effort  MSK:   Moves extremities without difficulty  Other:    Medical Decision Making  Medically screening exam initiated at 12:36 PM.  Appropriate orders placed.  Virginia [name] was informed that the remainder of the evaluation will be completed by another provider, this initial triage assessment does not replace that evaluation, and the importance of remaining in the ED until their evaluation is complete.  Cardiac workup, EKG appears unchanged.  Vital stable.   Virginia Lynwood DEL, PA-C 05/26/24 1239

## 2024-05-26 NOTE — Progress Notes (Signed)
 Per Dr. Ladona, patient is admitted for unstable angina, plan for cardiac cath tomorrow.  Admission orders, precath order placed.  Please maintain n.p.o. after midnight.  See full consult notes for details.

## 2024-05-26 NOTE — ED Triage Notes (Signed)
 Pt from home via [facility] with reports of CP while working on the computer. Pt reports it felt like something was sitting on her chest. Pt was given 3 total nitroglycerin  with improvement of CP. Pt was also given 324 ASA.

## 2024-05-26 NOTE — H&P (Addendum)
 CARDIOLOGY ADMIT NOTE     Patient ID: Virginia [name] MRN: [id number] DOB/AGE: 64-14-1962 64 y.o.  Admit date: 05/26/2024.   Primary Physician:  Paseda, Folashade R, FNP  Patient ID: Virginia [name], female    DOB: 23-Aug-1961, 63 y.o.   MRN: [id number]  Chief Complaint  Patient presents with   Chest Pain   HPI:    Virginia [name]  is a 63 y.o. African-American female patient with prediabetes mellitus, hypertension, f CAD with history of angioplasty to CX with BMS in 2016 and DES to proximal LAD in January 2022 amilial hypercholesterolemia who had refused PCSK9 inhibitor presently on high intensity Crestor  40 mg daily, recent admission to [facility] on 04/02/2024 with TIA involving right PCA distal P1 stenosis, stroke felt to be due to small vessel disease, discharged to rehab and has returned home and has essentially recuperated from most of the right upper and lower extremity weakness.  Had been doing well until about a week ago started noticing marked dyspnea on climbing up to her office at home which was new to her.  She is fairly active and states that she used to exercise regularly and go for a walk every day and also did all activities of daily living without any limitations.  Since stroke 2 months ago she has felt slightly weaker but otherwise had been doing well.  She had to rest after climbing 6 steps and after going to the top of the stairs, she had to rest for most an hour before she felt well.  This morning she broke out in sweat, walked to her office sat down and relaxed started having chest pain described as precordial heaviness and burning type sensation for which she took nitroglycerin .  There was no relief of chest discomfort, pain was intense 9-10 out of 10 in intensity and felt similar to when she had prior NSTEMI hence called the EMS.  With nitroglycerin  sublingual, pain level has come down to 2-3, she now feels comfortable but states that she is afraid to walk even  to the bathroom without having to precipitate chest pain.  Presently feels well and is comfortable.  She does not smoke cigarettes, does not use any tobacco products.  Past Medical History:  Diagnosis Date   Anemia    CAD (coronary artery disease)    a. 06/2015 BMS x2 to LCx. b. NSTEMI 11/2020 s/p DES to prox LAD; residual disease treated medically.   CKD (chronic kidney disease), stage II    Diabetes mellitus (HCC)    GERD (gastroesophageal reflux disease)    ?   GI bleeding    2016 - bleeding polyp clipped   Hyperlipidemia with target LDL less than 70    Hypertension    STEMI (ST elevation myocardial infarction) (HCC) 06/16/2015   BMS x 2 CFX   Thyroid  nodule    benign    Social History   Tobacco Use   Smoking status: Never   Smokeless tobacco: Never  Substance Use Topics   Alcohol use: No    Alcohol/week: 0.0 standard drinks of alcohol   Family History  Problem Relation Age of Onset   Stroke Mother    Heart disease Mother    Diabetes Mother    Cancer Father    Hypertension Sister    Hyperlipidemia Sister    Heart attack Brother    Hypertension Brother    Heart attack Maternal Uncle    Asthma Paternal Uncle    Diabetes  Paternal Uncle    Diabetes Paternal Aunt    Thyroid  disease Neg Hx     ROS  Review of Systems  Cardiovascular:  Positive for chest pain and dyspnea on exertion. Negative for leg swelling.   Objective      05/26/2024    4:22 PM 05/26/2024    4:21 PM 05/26/2024    4:15 PM  Vitals with BMI  Systolic 149 154 845  Diastolic 87 71 71  Pulse  64 66      Physical Exam Constitutional:      Appearance: She is obese.  Neck:     Vascular: No carotid bruit or JVD.  Cardiovascular:     Rate and Rhythm: Normal rate and regular rhythm.     Pulses: Intact distal pulses.     Heart sounds: Normal heart sounds. No murmur heard.    No gallop.  Pulmonary:     Effort: Pulmonary effort is normal.     Breath sounds: Normal breath sounds.  Abdominal:      General: Bowel sounds are normal.     Palpations: Abdomen is soft.  Musculoskeletal:     Right lower leg: No edema.     Left lower leg: No edema.    Laboratory examination:   Recent Labs    05/26/24 1227  NA 137  K 4.4  CL 104  CO2 22  GLUCOSE 106*  BUN 7*  CREATININE 0.90  CALCIUM  9.0  GFRNONAA >60   estimated creatinine clearance is 71.5 mL/min (by C-G formula based on SCr of 0.9 mg/dL).     Latest Ref Rng & Units 05/26/2024   12:27 PM 02/08/2023    6:00 AM 02/07/2023    8:50 PM  CMP  Glucose 70 - 99 mg/dL 893  886  858   BUN 8 - 23 mg/dL 7  8  10    Creatinine 0.44 - 1.00 mg/dL 9.09  9.02  8.94   Sodium 135 - 145 mmol/L 137  138  136   Potassium 3.5 - 5.1 mmol/L 4.4  3.5  3.5   Chloride 98 - 111 mmol/L 104  104  102   CO2 22 - 32 mmol/L 22  25  21    Calcium  8.9 - 10.3 mg/dL 9.0  9.0  8.9   Total Protein 6.5 - 8.1 g/dL  6.6  6.8   Total Bilirubin 0.3 - 1.2 mg/dL  0.7  0.6   Alkaline Phos 38 - 126 U/L  80  83   AST 15 - 41 U/L  16  27   ALT 0 - 44 U/L  12  13       Latest Ref Rng & Units 05/26/2024   12:27 PM 02/08/2023    6:00 AM 02/07/2023    8:50 PM  CBC  WBC 4.0 - 10.5 K/uL 6.9  6.3  4.9   Hemoglobin 12.0 - 15.0 g/dL 86.8  86.9  86.6   Hematocrit 36.0 - 46.0 % 39.4  38.6  38.7   Platelets 150 - 400 K/uL 181  179  196    Lipid Panel     Component Value Date/Time   CHOL 169 02/15/2021 1113   TRIG 53 02/15/2021 1113   HDL 65 02/15/2021 1113   CHOLHDL 2.6 02/15/2021 1113   CHOLHDL 4.0 11/25/2020 0316   VLDL 21 11/25/2020 0316   LDLCALC 93 02/15/2021 1113   HEMOGLOBIN A1C Lab Results  Component Value Date   HGBA1C 6.4 (H) 02/07/2023  MPG 137 02/07/2023   TSH No results for input(s): TSH in the last 8760 hours. BNP (last 3 results) No results for input(s): BNP in the last 8760 hours. Cardiac Panel (last 3 results) Recent Labs    05/26/24 1227 05/26/24 1427  TROPONINIHS 27* 98*     Care everywhere labs 28-Apr-2024:  CHOLESTEROL TOTAL 100 -  199 mg/dL 804  Trig 0 - 850 mg/dL 98  LDL 0 - 99 mg/dL 875 High   VLDL 5 - 40 mg/dl 20  CHOL/HDL 0 - 5 4  HDL >=39 mg/dL 51    Medications and allergies  No Known Allergies   Prior to Admission medications   Medication Sig Start Date End Date Taking? Authorizing Provider  acetaminophen  (TYLENOL ) 500 MG tablet Take 1 tablet (500 mg total) by mouth every 6 (six) hours as needed. Patient taking differently: Take 500 mg by mouth every 6 (six) hours as needed for mild pain (pain score 1-3). 04/27/21  Yes Tilford Bertram HERO, FNP  amLODipine  (NORVASC ) 5 MG tablet Take 5 mg by mouth daily.   Yes [provider]  aspirin  EC 81 MG tablet Take 1 tablet (81 mg total) by mouth daily. 09/27/19  Yes Stroud, Natalie M, FNP  ezetimibe  (ZETIA ) 10 MG tablet Take 10 mg by mouth at bedtime.   Yes [provider]  isosorbide  mononitrate (IMDUR ) 30 MG 24 hr tablet Take 30 mg by mouth daily.   Yes [provider]  losartan  (COZAAR ) 50 MG tablet Take 50 mg by mouth daily.   Yes [provider]  nitroGLYCERIN  (NITROSTAT ) 0.4 MG SL tablet Place 0.4 mg under the tongue every 5 (five) minutes as needed for chest pain.   Yes [provider]  omeprazole  (PRILOSEC) 20 MG capsule Take 1 capsule (20 mg total) by mouth daily. 04/09/21  Yes Myrna Camelia HERO, NP  rosuvastatin  (CRESTOR ) 40 MG tablet Take 40 mg by mouth daily.   Yes [provider]  traMADol  (ULTRAM ) 50 MG tablet Take 50 mg by mouth every 6 (six) hours as needed. 04/13/24  Yes [provider]  lisinopril  (ZESTRIL ) 2.5 MG tablet Take 1 tablet (2.5 mg total) by mouth daily. 10/13/20 11/26/20  Stroud, Natalie M, FNP  metoprolol  tartrate (LOPRESSOR ) 25 MG tablet Take 0.5 tablets (12.5 mg total) by mouth 2 (two) times daily. 10/13/20 11/26/20  Stroud, Natalie M, FNP     heparin  950 Units/hr (05/26/24 1617)   nitroGLYCERIN       Radiology:  DG Chest Port 1 View Result Date: 05/26/2024 CLINICAL DATA:   Chest pain. EXAM: PORTABLE CHEST 1 VIEW COMPARISON:  02/07/2023. FINDINGS: Cardiac silhouette is normal in size and configuration. Left coronary artery stents are new since the prior exam. No mediastinal or hilar masses. No evidence of adenopathy. Clear lungs.  No pleural effusion or pneumothorax. Skeletal structures are grossly intact. IMPRESSION: No active disease. Electronically Signed   By: Alm Parkins M.D.   On: 05/26/2024 15:10    Cardiac Studies:   Normal echocardiogram 2024/04/28 on Care Everywhere: Normal LV systolic function without wall motion normality at 55 to 60%. No significant valvular abnormality.  CTA head and neck 04/02/2024: Occlusion of right PCA at distal P1 and involving P2 PCA. Some but limited distal flow.  Moderate stenosis involving left proximal P2 PCA.  Poor flow and suspected moderate to severe stenosis involving left A3/84 ACA.   Mild to moderate calcified plaque involving the left cervical ICA causing mild stenosis   EKG  05/26/2024: Presenting ECG was normal sinus rhythm at the [facility].  ED visit EKG revealing T wave abnormality in anterior leads suggestive of subendocardial ischemia.  Serial EKGs reveal improvement in T wave abnormality.  Assessment   1.  NSTEMI 2.  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  Has history of proximal LAD stenting with 2.75 x 15 mm resolute stent on 11/25/2020 and patent circumflex stents placed for STEMI in 2016. 3.  Familial hypercholesterolemia, lipids still uncontrolled and patient previously had refused PCSK9 inhibitors.  Presently on Crestor  40 mg daily.  Will add Zetia  10 mg daily. 4.  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  Places her at slightly increased risk of stroke and also risk of intracranial bleeding, patient is aware.  Recommendations:   Patient chest pain symptoms have essentially completely resolved or nearly resolved.  EKG abnormalities have also improved.   Patient is now more comfortable, we will admit the patient for cardiac catheterization tomorrow unless she has recurrence of chest pain or marked EKG abnormalities or significant arrhythmias, she will be taken to the cardiac Cath Lab on emergent basis. Will start her on IV heparin  and IV nitroglycerin .  Will continue with low-dose beta-blocker therapy and ACE inhibitor's as well.  Schedule for cardiac catheterization, and possible angioplasty. We discussed regarding risks, benefits, alternatives to this including stress testing, CTA and continued medical therapy. Patient wants to proceed. Understands <1-2% risk of death, stroke, MI, urgent CABG, bleeding, infection, renal failure but not limited to these.   She has had recent stroke, placing her at slight increased risk of intracranial bleeding or recurrence of stroke especially in view of uncontrolled hypercholesterolemia.  Patient is aware of this.  Will try to convince her for starting PCSK9 inhibitors or inclisiran.  Will start Zetia  for now.  Patient is full code, discussed need for CPR in the event of cardiac arrest, patient prefers to be full code.    Virginia Bergamo, MD, [facility] 05/26/2024, 4:51 PM [facility] [address] [place], [place] [address] Phone: [phone]. Fax:  [phone]

## 2024-05-27 ENCOUNTER — Encounter (HOSPITAL_COMMUNITY): Payer: Self-pay | Admitting: Cardiology

## 2024-05-27 ENCOUNTER — Telehealth (HOSPITAL_COMMUNITY): Payer: Self-pay | Admitting: Pharmacy Technician

## 2024-05-27 ENCOUNTER — Other Ambulatory Visit (HOSPITAL_COMMUNITY)

## 2024-05-27 ENCOUNTER — Encounter (HOSPITAL_COMMUNITY)
Admission: EM | Disposition: A | Discharge status: Home / Self Care | Payer: Self-pay | Source: Home / Self Care | Attending: Cardiology

## 2024-05-27 ENCOUNTER — Ambulatory Visit (HOSPITAL_COMMUNITY): Admit: 2024-05-27 | Admitting: Cardiology

## 2024-05-27 ENCOUNTER — Other Ambulatory Visit (HOSPITAL_COMMUNITY): Payer: Self-pay

## 2024-05-27 DIAGNOSIS — I10 Essential (primary) hypertension: Secondary | ICD-10-CM

## 2024-05-27 DIAGNOSIS — E7800 Pure hypercholesterolemia, unspecified: Secondary | ICD-10-CM

## 2024-05-27 DIAGNOSIS — I25110 Atherosclerotic heart disease of native coronary artery with unstable angina pectoris: Secondary | ICD-10-CM

## 2024-05-27 HISTORY — PX: CORONARY STENT INTERVENTION: CATH118234

## 2024-05-27 HISTORY — PX: LEFT HEART CATH AND CORONARY ANGIOGRAPHY: CATH118249

## 2024-05-27 LAB — POCT ACTIVATED CLOTTING TIME
Activated Clotting Time: 285 s
Activated Clotting Time: 314 s
Activated Clotting Time: 297 s
Activated Clotting Time: 227 s
Activated Clotting Time: 158 s
Activated Clotting Time: 187 s

## 2024-05-27 LAB — BASIC METABOLIC PANEL WITH GFR
Anion gap: 10 (ref 5–15)
BUN: 10 mg/dL (ref 8–23)
CO2: 21 mmol/L — ABNORMAL LOW (ref 22–32)
Calcium: 9.1 mg/dL (ref 8.9–10.3)
Chloride: 105 mmol/L (ref 98–111)
Creatinine, Ser: 1.04 mg/dL — ABNORMAL HIGH (ref 0.44–1.00)
GFR, Estimated: >60 mL/min (ref >60)
Glucose, Bld: 122 mg/dL — ABNORMAL HIGH (ref 70–99)
Potassium: 4.1 mmol/L (ref 3.5–5.1)
Sodium: 136 mmol/L (ref 135–145)

## 2024-05-27 LAB — CBC
HCT: 40.5 % (ref 36.0–46.0)
Hemoglobin: 13.4 g/dL (ref 12.0–15.0)
MCH: 29.8 pg (ref 26.0–34.0)
MCHC: 33.1 g/dL (ref 30.0–36.0)
MCV: 90 fL (ref 80.0–100.0)
Platelets: 175 K/uL (ref 150–400)
RBC: 4.5 MIL/uL (ref 3.87–5.11)
RDW: 13.9 % (ref 11.5–15.5)
WBC: 6.3 K/uL (ref 4.0–10.5)
nRBC: 0 % (ref 0.0–0.2)

## 2024-05-27 LAB — LIPID PANEL
Cholesterol: 123 mg/dL (ref 0–200)
HDL: 46 mg/dL (ref >40)
LDL Cholesterol: 56 mg/dL (ref 0–99)
Total CHOL/HDL Ratio: 2.7 ratio
Triglycerides: 105 mg/dL (ref <150)
VLDL: 21 mg/dL (ref 0–40)

## 2024-05-27 LAB — HEPARIN LEVEL (UNFRACTIONATED)
Heparin Unfractionated: 0.61 [IU]/mL (ref 0.30–0.70)
Heparin Unfractionated: 0.63 [IU]/mL (ref 0.30–0.70)

## 2024-05-27 LAB — GLUCOSE, CAPILLARY: Glucose-Capillary: 104 mg/dL — ABNORMAL HIGH (ref 70–99)

## 2024-05-27 SURGERY — LEFT HEART CATH AND CORONARY ANGIOGRAPHY
Anesthesia: LOCAL

## 2024-05-27 MED ORDER — TICAGRELOR 90 MG PO TABS
90.0000 mg | ORAL_TABLET | Freq: Two times a day (BID) | ORAL | Status: DC
  Administered 2024-05-27 – 2024-05-28 (×2): 90 mg via ORAL
  Filled 2024-05-27 (×2): qty 1

## 2024-05-27 MED ORDER — SODIUM CHLORIDE 0.9 % IV SOLN: 250.0000 mL | INTRAVENOUS | Status: DC | PRN

## 2024-05-27 MED ORDER — HEPARIN SODIUM (PORCINE) 1000 UNIT/ML IJ SOLN
INTRAMUSCULAR | Status: AC
  Filled 2024-05-27: qty 10

## 2024-05-27 MED ORDER — LIDOCAINE HCL (PF) 1 % IJ SOLN
INTRAMUSCULAR | Status: DC | PRN
  Administered 2024-05-27: 12 mL

## 2024-05-27 MED ORDER — HYDRALAZINE HCL 20 MG/ML IJ SOLN: 10.0000 mg | INTRAMUSCULAR | Status: AC | PRN

## 2024-05-27 MED ORDER — NITROGLYCERIN 1 MG/10 ML FOR IR/CATH LAB
INTRA_ARTERIAL | Status: DC | PRN
  Administered 2024-05-27 (×3): 200 ug via INTRACORONARY

## 2024-05-27 MED ORDER — NITROGLYCERIN 1 MG/10 ML FOR IR/CATH LAB
INTRA_ARTERIAL | Status: AC
  Filled 2024-05-27: qty 10

## 2024-05-27 MED ORDER — ENOXAPARIN SODIUM 40 MG/0.4ML IJ SOSY
40.0000 mg | PREFILLED_SYRINGE | INTRAMUSCULAR | Status: DC
  Administered 2024-05-28: 40 mg via SUBCUTANEOUS
  Filled 2024-05-27: qty 0.4

## 2024-05-27 MED ORDER — MORPHINE SULFATE (PF) 2 MG/ML IV SOLN: 2.0000 mg | Freq: Once | INTRAVENOUS | Status: AC

## 2024-05-27 MED ORDER — HEPARIN SODIUM (PORCINE) 1000 UNIT/ML IJ SOLN
INTRAMUSCULAR | Status: DC | PRN
  Administered 2024-05-27: 8000 [IU] via INTRAVENOUS

## 2024-05-27 MED ORDER — MORPHINE SULFATE (PF) 2 MG/ML IV SOLN
INTRAVENOUS | Status: AC
  Administered 2024-05-27: 2 mg via INTRAVENOUS
  Filled 2024-05-27: qty 1

## 2024-05-27 MED ORDER — TICAGRELOR 90 MG PO TABS
ORAL_TABLET | ORAL | Status: DC | PRN
  Administered 2024-05-27: 180 mg via ORAL

## 2024-05-27 MED ORDER — MIDAZOLAM HCL 2 MG/2ML IJ SOLN
INTRAMUSCULAR | Status: AC
  Filled 2024-05-27: qty 2

## 2024-05-27 MED ORDER — SODIUM CHLORIDE 0.9 % IV SOLN: INTRAVENOUS | Status: AC

## 2024-05-27 MED ORDER — FENTANYL CITRATE (PF) 100 MCG/2ML IJ SOLN
INTRAMUSCULAR | Status: AC
  Filled 2024-05-27: qty 2

## 2024-05-27 MED ORDER — MIDAZOLAM HCL 2 MG/2ML IJ SOLN
INTRAMUSCULAR | Status: DC | PRN
  Administered 2024-05-27 (×2): 1 mg via INTRAVENOUS

## 2024-05-27 MED ORDER — VERAPAMIL HCL 2.5 MG/ML IV SOLN
INTRAVENOUS | Status: AC
  Filled 2024-05-27: qty 2

## 2024-05-27 MED ORDER — IOHEXOL 350 MG/ML SOLN
INTRAVENOUS | Status: DC | PRN
  Administered 2024-05-27: 160 mL

## 2024-05-27 MED ORDER — HEPARIN (PORCINE) IN NACL 1000-0.9 UT/500ML-% IV SOLN
INTRAVENOUS | Status: DC | PRN
Start: 2024-05-27 — End: 2024-05-27
  Administered 2024-05-27 (×2): 500 mL

## 2024-05-27 MED ORDER — FENTANYL CITRATE (PF) 100 MCG/2ML IJ SOLN
INTRAMUSCULAR | Status: DC | PRN
  Administered 2024-05-27 (×2): 25 ug via INTRAVENOUS

## 2024-05-27 MED ORDER — SODIUM CHLORIDE 0.9% FLUSH: 3.0000 mL | INTRAVENOUS | Status: DC | PRN

## 2024-05-27 MED ORDER — LIDOCAINE HCL (PF) 1 % IJ SOLN
INTRAMUSCULAR | Status: AC
Start: 2024-05-27 — End: 2024-05-27
  Filled 2024-05-27: qty 30

## 2024-05-27 MED ORDER — TICAGRELOR 90 MG PO TABS
ORAL_TABLET | ORAL | Status: AC
  Filled 2024-05-27: qty 2

## 2024-05-27 MED ORDER — SODIUM CHLORIDE 0.9% FLUSH
3.0000 mL | Freq: Two times a day (BID) | INTRAVENOUS | Status: DC
  Administered 2024-05-27 – 2024-05-28 (×2): 3 mL via INTRAVENOUS

## 2024-05-27 SURGICAL SUPPLY — 23 items
BALLOON EMERGE MR 2.0X15 (BALLOONS) IMPLANT
BALLOON SAPPHIRE NC24 3.0X15 (BALLOONS) IMPLANT
BALLOON ~~LOC~~ EMERGE MR 2.75X20 (BALLOONS) IMPLANT
CATH 5FR JL3.5 JR4 ANG PIG MP (CATHETERS) IMPLANT
CATH INFINITI 5FR JL4 (CATHETERS) IMPLANT
CATH LAUNCHER 6FR EBU 3.75 (CATHETERS) IMPLANT
CATH VISTA GUIDE 6FR XBLD 3.5 (CATHETERS) IMPLANT
CATH VISTA GUIDE 6FR XBRCA MPK (CATHETERS) IMPLANT
GLIDESHEATH SLEND SS 6F .021 (SHEATH) IMPLANT
GUIDEWIRE INQWIRE 1.5J.035X260 (WIRE) IMPLANT
KIT ENCORE 26 ADVANTAGE (KITS) IMPLANT
KIT MICROPUNCTURE NIT STIFF (SHEATH) IMPLANT
PACK CARDIAC CATHETERIZATION (CUSTOM PROCEDURE TRAY) ×1 IMPLANT
SET ATX-X65L (MISCELLANEOUS) IMPLANT
SHEATH PINNACLE 5F 10CM (SHEATH) IMPLANT
SHEATH PINNACLE 6F 10CM (SHEATH) IMPLANT
SHEATH PROBE COVER 6X72 (BAG) IMPLANT
STENT SYNERGY XD 2.50X24 (Permanent Stent) IMPLANT
STENT SYNERGY XD 2.50X38 (Permanent Stent) IMPLANT
STENT SYNERGY XD 3.0X12 (Permanent Stent) IMPLANT
TUBING CIL FLEX 10 FLL-RA (TUBING) IMPLANT
WIRE ASAHI PROWATER 180CM (WIRE) IMPLANT
WIRE EMERALD 3MM-J .035X150CM (WIRE) IMPLANT

## 2024-05-27 NOTE — H&P (View-Only) (Signed)
 Patient Name: Virginia [name] Date of Encounter: 05/27/2024 [facility] Cardiologist: Candyce Reek, MD  05/26/2024 .admit Length of stay: 1  Interval Summary  .    Virginia [name] is a 63 y.o. African-American [name] patient with prediabetes mellitus, hypertension, f CAD with history of angioplasty to CX with BMS in 2016 and DES to proximal LAD in January 2022 amilial hypercholesterolemia who had refused PCSK9 inhibitor presently on high intensity Crestor  40 mg daily, recent admission to [facility] on 04/02/2024 with TIA involving right PCA distal P1 stenosis, stroke felt to be due to small vessel disease, discharged to rehab and has returned home and has essentially recuperated from most of the right upper and lower extremity weakness.   Admitted to the hospital with NSTEMI with worsening dyspnea that started a week ago and chest pain yesterday.  Patient had remained asymptomatic overnight.  Around 430 this morning did have an episode of chest pain.  Presently on IV heparin  and nitroglycerin .  At the present time still has mild chest discomfort but in general states that she is comfortable.  No dyspnea, no PND or orthopnea.  Physical Exam    Vitals:   05/26/24 2317 05/27/24 0345 05/27/24 0430 05/27/24 0732  BP: 118/71 127/68  (!) 142/78  Pulse: 67 72 64 83  Resp: 15 14 15 18   Temp: 98 F (36.7 C) 98.1 F (36.7 C)  98.1 F (36.7 C)  TempSrc: Oral Oral  Oral  SpO2: 97% 98% 96% 96%  Weight:   85 kg   Height:       Physical Exam Constitutional:      Appearance: She is obese.  Neck:     Vascular: No carotid bruit or JVD.  Cardiovascular:     Rate and Rhythm: Normal rate and regular rhythm.     Pulses: Intact distal pulses.     Heart sounds: Normal heart sounds. No murmur heard.    No gallop.  Pulmonary:     Effort: Pulmonary effort is normal.     Breath sounds: Normal breath sounds.  Abdominal:     General: Bowel sounds are normal.      Palpations: Abdomen is soft.  Musculoskeletal:     Right lower leg: No edema.     Left lower leg: No edema.        05/27/2024    4:30 AM 05/26/2024    3:22 PM 02/08/2023   12:30 AM  Last 3 Weights  Weight (lbs) 187 lb 4.8 oz 199 lb 4.7 oz 168 lb 6.9 oz  Weight (kg) 84.959 kg 90.4 kg 76.4 kg      Labs   Lab Results  Component Value Date   NA 136 05/27/2024   K 4.1 05/27/2024   CO2 21 (L) 05/27/2024   GLUCOSE 122 (H) 05/27/2024   BUN 10 05/27/2024   CREATININE 1.04 (H) 05/27/2024   CALCIUM  9.1 05/27/2024   GFR 73.08 07/29/2015   EGFR 69 08/03/2021   GFRNONAA >60 05/27/2024       Latest Ref Rng & Units 05/27/2024    2:41 AM 05/26/2024   12:27 PM 02/08/2023    6:00 AM  BMP  Glucose 70 - 99 mg/dL 877  893  886   BUN 8 - 23 mg/dL 10  7  8    Creatinine 0.44 - 1.00 mg/dL 8.95  9.09  9.02   Sodium 135 - 145 mmol/L 136  137  138   Potassium 3.5 - 5.1  mmol/L 4.1  4.4  3.5   Chloride 98 - 111 mmol/L 105  104  104   CO2 22 - 32 mmol/L 21  22  25    Calcium  8.9 - 10.3 mg/dL 9.1  9.0  9.0        Latest Ref Rng & Units 05/27/2024    2:41 AM 05/26/2024   12:27 PM 02/08/2023    6:00 AM  CBC  WBC 4.0 - 10.5 K/uL 6.3  6.9  6.3   Hemoglobin 12.0 - 15.0 g/dL 86.5  86.8  86.9   Hematocrit 36.0 - 46.0 % 40.5  39.4  38.6   Platelets 150 - 400 K/uL 175  181  179     Lab Results  Component Value Date   CHOL 123 05/27/2024   HDL 46 05/27/2024   LDLCALC 56 05/27/2024   TRIG 105 05/27/2024   CHOLHDL 2.7 05/27/2024    Lab Results  Component Value Date   TSH 1.507 02/07/2023    Lab Results  Component Value Date   HGBA1C 6.6 (H) 05/26/2024    Cardiac Panel (last 3 results) Recent Labs    05/26/24 1227 05/26/24 1427  TROPONINIHS 27* 98*    Intake/Output Summary (Last 24 hours) at 05/27/2024 0917 Last data filed at 05/27/2024 0533 Gross per 24 hour  Intake 156.28 ml  Output 400 ml  Net -243.72 ml    Net IO Since Admission: -243.72 mL [05/27/24 0917]  Tele/EKG/Cardiac  studies    EKG:  EKG 05/26/2024: Presenting ECG was normal sinus rhythm at the [facility].  ED visit EKG revealing T wave abnormality in anterior leads suggestive of subendocardial ischemia.  Serial EKGs reveal improvement in T wave abnormality.   EKG 05/27/2024: T wave abnormality more pronounced.  No change in tall R wave in V1 through V3 noted previously as a baseline.  Normal echocardiogram 04/03/2024 on Care Everywhere: Normal LV systolic function without wall motion normality at 55 to 60%. No significant valvular abnormality.   CTA head and neck 04/02/2024: Occlusion of right PCA at distal P1 and involving P2 PCA. Some but limited distal flow.  Moderate stenosis involving left proximal P2 PCA.  Poor flow and suspected moderate to severe stenosis involving left A3/84 ACA.   Mild to moderate calcified plaque involving the left cervical ICA causing mild stenosis     Radiology   DG Chest Port 1 View Result Date: 05/26/2024 CLINICAL DATA:  Chest pain. EXAM: PORTABLE CHEST 1 VIEW COMPARISON:  02/07/2023. FINDINGS: Cardiac silhouette is normal in size and configuration. Left coronary artery stents are new since the prior exam. No mediastinal or hilar masses. No evidence of adenopathy. Clear lungs.  No pleural effusion or pneumothorax. Skeletal structures are grossly intact. IMPRESSION: No active disease. Electronically Signed   By: Alm Parkins M.D.   On: 05/26/2024 15:10     Current Meds:     Current Facility-Administered Medications:    [EXPIRED] 0.9% sodium chloride  infusion, 3 mL/kg/hr, Intravenous, Continuous, Last Rate: 271.2 mL/hr at 05/27/24 0407, 3 mL/kg/hr at 05/27/24 0407 **FOLLOWED BY** 0.9% sodium chloride  infusion, 1 mL/kg/hr, Intravenous, Continuous, Zhao, Xika, NP, Last Rate: 90.4 mL/hr at 05/27/24 0509, 1 mL/kg/hr at 05/27/24 9490   acetaminophen  (TYLENOL ) tablet 650 mg, 650 mg, Oral, Q4H PRN, Zhao, Xika, NP   amLODipine  (NORVASC ) tablet 5 mg, 5 mg, Oral, Daily, Zhao, Xika,  NP, 5 mg at 05/26/24 1818   aspirin  EC tablet 81 mg, 81 mg, Oral, Daily, Zhao, Xika, NP  ezetimibe  (ZETIA ) tablet 10 mg, 10 mg, Oral, Daily, Terius Jacuinde, MD, 10 mg at 05/26/24 1818   ferrous sulfate  tablet 325 mg, 325 mg, Oral, Q breakfast, Zhao, Xika, NP   heparin  ADULT infusion 100 units/mL (25000 units/250mL), 950 Units/hr, Intravenous, Continuous, Ladona Heinz, MD, Last Rate: 9.5 mL/hr at 05/26/24 1617, 950 Units/hr at 05/26/24 1617   losartan  (COZAAR ) tablet 50 mg, 50 mg, Oral, Daily, Zhao, Xika, NP, 50 mg at 05/26/24 1818   metoprolol  succinate (TOPROL -XL) 24 hr tablet 12.5 mg, 12.5 mg, Oral, Daily, Zhao, Xika, NP, 12.5 mg at 05/26/24 1621   nitroGLYCERIN  50 mg in dextrose  5 % 250 mL (0.2 mg/mL) infusion, 0-30 mcg/min, Intravenous, Titrated, Zhao, Xika, NP, Last Rate: 9 mL/hr at 05/27/24 0510, 30 mcg/min at 05/27/24 0510   Oral care mouth rinse, 15 mL, Mouth Rinse, PRN, Ladona Heinz, MD   pantoprazole  (PROTONIX ) EC tablet 40 mg, 40 mg, Oral, Daily, Zhao, Xika, NP, 40 mg at 05/26/24 1818   rosuvastatin  (CRESTOR ) tablet 40 mg, 40 mg, Oral, Daily, Zhao, Xika, NP, 40 mg at 05/26/24 1818  Assessment & Plan .     1.  NSTEMI 2.  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  Familial hypercholesterolemia 4.  Primary hypertension  Recommendations: Patient had recurrence of chest pain this morning.  Will change her cardiac catheterization from a later time to next case, with recent history of stroke, not a candidate for Effient, if PCI done, will need Brilinta .  With regard to hypercholesterolemia, I have added Zetia  to her present medical regimen.  LDL is improved from greater than 200 mg to 124 by recent lipid profile testing on 03/26/2024 and LDL today is 56.  Labs reviewed, mild increased serum creatinine noted.  Will increase her IV hydration for now.  Echocardiogram pending.  With regard to hypertension, will address this postprocedure, presently on amlodipine  5 mg  daily, losartan  50 mg daily and metoprolol  succinate 12.5 mg daily.  For questions or updates, please contact [facility] Please consult [email or web address] for contact info under        Signed,   Heinz Ladona, MD, [facility] 05/27/2024, 9:17 AM [facility] [address] [place], [place] [address] Phone: [phone]. Fax:  [phone]

## 2024-05-27 NOTE — Progress Notes (Signed)
 PHARMACY - ANTICOAGULATION CONSULT NOTE  Pharmacy Consult for IV heparin  Indication: NSTEMI  No Known Allergies  Patient Measurements: Height: 5' 6 (167.6 cm) Weight: 85 kg (187 lb 4.8 oz) IBW/kg (Calculated) : 59.3 HEPARIN  DW (KG): 79  Vital Signs: Temp: 98.1 F (36.7 C) (07/21 0732) Temp Source: Oral (07/21 0732) BP: 142/78 (07/21 0732) Pulse Rate: 83 (07/21 0732)  Labs: Recent Labs    05/26/24 1227 05/26/24 1427 05/26/24 1826 05/26/24 2049 05/27/24 0007 05/27/24 0241  HGB 13.1  --   --   --   --  13.4  HCT 39.4  --   --   --   --  40.5  PLT 181  --   --   --   --  175  APTT  --   --  >200* 158*  --   --   LABPROT  --   --  15.1  --   --   --   INR  --   --  1.1  --   --   --   HEPARINUNFRC  --   --   --   --  0.63 0.61  CREATININE 0.90  --   --   --   --  1.04*  TROPONINIHS 27* 98*  --   --   --   --     Estimated Creatinine Clearance: 60 mL/min (A) (by C-G formula based on SCr of 1.04 mg/dL (H)).   Medical History: Past Medical History:  Diagnosis Date   Anemia    CAD (coronary artery disease)    a. 06/2015 BMS x2 to LCx. b. NSTEMI 11/2020 s/p DES to prox LAD; residual disease treated medically.   CKD (chronic kidney disease), stage II    Diabetes mellitus (HCC)    GERD (gastroesophageal reflux disease)    ?   GI bleeding    2016 - bleeding polyp clipped   Hyperlipidemia with target LDL less than 70    Hypertension    STEMI (ST elevation myocardial infarction) (HCC) 06/16/2015   BMS x 2 CFX   Thyroid  nodule    benign    Medications:  Scheduled:   amLODipine   5 mg Oral Daily   aspirin  EC  81 mg Oral Daily   ezetimibe   10 mg Oral Daily   ezetimibe   10 mg Oral Daily   ferrous sulfate   325 mg Oral Q breakfast   losartan   50 mg Oral Daily   metoprolol  succinate  12.5 mg Oral Daily   pantoprazole   40 mg Oral Daily   rosuvastatin   40 mg Oral Daily    Assessment: 63 yo F presenting with NSTEMI. Pharmacy consulted for anticoagulation. PTA meds  reviewed and continued as appropriate. Hgb and platelets stable. No signs/symptoms of bleeding or interruptions to the infusion per RN. HL therapeutic at 0.61.   Goal of Therapy:  Heparin  level 0.3-0.7 units/ml Monitor platelets by anticoagulation protocol: Yes   Plan:  Continue heparin  at 950 units/ hour IV. Check anti-Xa level daily while on heparin  Continue to monitor H&H and platelets  Virginia [name] 05/27/2024,7:39 AM

## 2024-05-27 NOTE — Interval H&P Note (Signed)
 History and Physical Interval Note:  05/27/2024 9:46 AM  Virginia [name]  has presented today for surgery, with the diagnosis of usa .  The various methods of treatment have been discussed with the patient and family. After consideration of risks, benefits and other options for treatment, the patient has consented to  Procedure(s): LEFT HEART CATH AND CORONARY ANGIOGRAPHY (N/A) as a surgical intervention.  The patient's history has been reviewed, patient examined, no change in status, stable for surgery.  I have reviewed the patient's chart and labs.  Questions were answered to the patient's satisfaction.   Cath Lab Visit (complete for each Cath Lab visit)  Clinical Evaluation Leading to the Procedure:   ACS: Yes.    Non-ACS:    Anginal Classification: CCS IV  Anti-ischemic medical therapy: Maximal Therapy (2 or more classes of medications)  Non-Invasive Test Results: No non-invasive testing performed  Prior CABG: No previous CABG        [name] 05/27/2024 9:46 AM

## 2024-05-27 NOTE — Plan of Care (Signed)

## 2024-05-27 NOTE — Progress Notes (Signed)
 Patient c/o 10/10 chest pain, nitroglycerin  gtt at 30mcg/min. EKG obtained and in chart, pre-cath aspirin  given. Patient on the schedule for 12pm left heart cath. Patient states the pain is coming in waves, no shortness of breath. CARDS paged.

## 2024-05-27 NOTE — Progress Notes (Signed)
 PHARMACY - ANTICOAGULATION CONSULT NOTE  Pharmacy Consult for heparin  Indication: NSTEMI  Labs: Recent Labs    05/26/24 1227 05/26/24 1427 05/26/24 1826 05/26/24 2049 05/27/24 0007  HGB 13.1  --   --   --   --   HCT 39.4  --   --   --   --   PLT 181  --   --   --   --   APTT  --   --  >200* 158*  --   LABPROT  --   --  15.1  --   --   INR  --   --  1.1  --   --   HEPARINUNFRC  --   --   --   --  0.63  CREATININE 0.90  --   --   --   --   TROPONINIHS 27* 98*  --   --   --    Assessment/Plan:  63yo female therapeutic on heparin  with initial dosing for NSTEMI. Will continue infusion at current rate of 950 units/hr and confirm stable with am labs.  Marvetta Dauphin, PharmD, BCPS 05/27/2024 12:42 AM

## 2024-05-27 NOTE — Progress Notes (Signed)
 Site area: Right Femoral Site Prior to Removal:  Level 0 Pressure Applied For: 30 minutes Manual:   Yes Patient Status During Pull:  Stable, see notes Post Pull Site:  Level 0 Post Pull Instructions Given:  Yes Post Pull Pulses Present: +1 Right DP; Right PT Doppler Dressing Applied:  Gauze and Tegaderm Bedrest begins @ 1615 Comments: Pt appeared to vagal halfway through sheath pull. Cath Labs RN Chiquita and Leita at bedside. Pt placed in Trendelenburg and given 100 cc fluid bolus. HR remained stable w/ BP return to normal. Groin site assessed by both RN's as stable. Pt reports feeling back to normal. No further intervention. No LOC.

## 2024-05-27 NOTE — Progress Notes (Signed)
 RN placed three of patients rings inside plastic bag secured in patients black sunflower purse located on bedside table.

## 2024-05-27 NOTE — Telephone Encounter (Signed)
 Pharmacy Patient Advocate Encounter  Insurance verification completed.    The patient is insured through [employer].     Ran test claim for Brilianta 90mg  and the current 30 day co-pay is $4.00.  Brand name is preferred.   This test claim was processed through [facility]- copay amounts may vary at other pharmacies due to pharmacy/plan contracts, or as the patient moves through the different stages of their insurance plan.

## 2024-05-27 NOTE — Progress Notes (Signed)
 Echo attempted multiple times, patient in cath lab all day. Will reattempt as schedule permits.  [facility] Tomaz Janis RDCS

## 2024-05-27 NOTE — TOC CM/SW Note (Signed)
 Transition of Care [facility]) - Inpatient Brief Assessment   Patient Details  Name: Virginia [name] MRN: [id number] Date of Birth: October 19, 1961  [facility] of [facility]) CM/SW Contact:    Lauraine FORBES Saa, LCSW Phone Number: 05/27/2024, 9:37 AM   Clinical Narrative:  9:37 AM Per chart review, resides at home. Patient has a PCP and insurance. Patient does not have SNF/HH/DME history. Patient's preferred pharmacy's are [facility], [facility], [facility] [address], and [place] [address] [address]. No TOC needs were identified at this time. TOC will continue to follow and be available to assist.  Transition of Care Asessment: Insurance and Status: Insurance coverage has been reviewed Patient has primary care physician: Yes Home environment has been reviewed: Private Residence Prior level of function:: N/A Prior/Current Home Services: No current home services Social Drivers of Health Review: SDOH reviewed no interventions necessary Readmission risk has been reviewed: Yes Transition of care needs: no transition of care needs at this time

## 2024-05-27 NOTE — Progress Notes (Signed)
 Patient complaining of intermittent chest pain 9/10 nitroglycerin  gtt increased to 30 mcg/min. Patient states chest pain is slowly resolving, also gave patient her pre-cath chewable aspirin .

## 2024-05-27 NOTE — Plan of Care (Signed)
  Problem: Activity: Goal: Ability to tolerate increased activity will improve Outcome: Progressing   Problem: Cardiac: Goal: Ability to achieve and maintain adequate cardiovascular perfusion will improve Outcome: Progressing   Problem: Education: Goal: Knowledge of General Education information will improve Description: Including pain rating scale, medication(s)/side effects and non-pharmacologic comfort measures Outcome: Progressing   Problem: Coping: Goal: Level of anxiety will decrease Outcome: Progressing   Problem: Safety: Goal: Ability to remain free from injury will improve Outcome: Progressing   Problem: Activity: Goal: Ability to return to baseline activity level will improve Outcome: Progressing   Problem: Cardiovascular: Goal: Vascular access site(s) Level 0-1 will be maintained Outcome: Progressing

## 2024-05-27 NOTE — Progress Notes (Signed)
 RN went into room patient lying in bed with eyes closed. Patient states pain is 7/10 pain pressure and burning in chest. Nitroglycerin  gtt max at 30 mcg.  Artist, PA notified.

## 2024-05-27 NOTE — Progress Notes (Signed)
 Patient Name: Virginia [name] Date of Encounter: 05/27/2024 [facility] Cardiologist: Candyce Reek, MD  05/26/2024 .admit Length of stay: 1  Interval Summary  .    Virginia [name] is a 63 y.o. African-American female patient with prediabetes mellitus, hypertension, f CAD with history of angioplasty to CX with BMS in 2016 and DES to proximal LAD in January 2022 amilial hypercholesterolemia who had refused PCSK9 inhibitor presently on high intensity Crestor  40 mg daily, recent admission to [facility] on 04/02/2024 with TIA involving right PCA distal P1 stenosis, stroke felt to be due to small vessel disease, discharged to rehab and has returned home and has essentially recuperated from most of the right upper and lower extremity weakness.   Admitted to the hospital with NSTEMI with worsening dyspnea that started a week ago and chest pain yesterday.  Patient had remained asymptomatic overnight.  Around 430 this morning did have an episode of chest pain.  Presently on IV heparin  and nitroglycerin .  At the present time still has mild chest discomfort but in general states that Virginia [name] is comfortable.  No dyspnea, no PND or orthopnea.  Physical Exam    Vitals:   05/26/24 2317 05/27/24 0345 05/27/24 0430 05/27/24 0732  BP: 118/71 127/68  (!) 142/78  Pulse: 67 72 64 83  Resp: 15 14 15 18   Temp: 98 F (36.7 C) 98.1 F (36.7 C)  98.1 F (36.7 C)  TempSrc: Oral Oral  Oral  SpO2: 97% 98% 96% 96%  Weight:   85 kg   Height:       Physical Exam Constitutional:      Appearance: Virginia [name] is obese.  Neck:     Vascular: No carotid bruit or JVD.  Cardiovascular:     Rate and Rhythm: Normal rate and regular rhythm.     Pulses: Intact distal pulses.     Heart sounds: Normal heart sounds. No murmur heard.    No gallop.  Pulmonary:     Effort: Pulmonary effort is normal.     Breath sounds: Normal breath sounds.  Abdominal:     General: Bowel sounds are normal.      Palpations: Abdomen is soft.  Musculoskeletal:     Right lower leg: No edema.     Left lower leg: No edema.        05/27/2024    4:30 AM 05/26/2024    3:22 PM 02/08/2023   12:30 AM  Last 3 Weights  Weight (lbs) 187 lb 4.8 oz 199 lb 4.7 oz 168 lb 6.9 oz  Weight (kg) 84.959 kg 90.4 kg 76.4 kg      Labs   Lab Results  Component Value Date   NA 136 05/27/2024   K 4.1 05/27/2024   CO2 21 (L) 05/27/2024   GLUCOSE 122 (H) 05/27/2024   BUN 10 05/27/2024   CREATININE 1.04 (H) 05/27/2024   CALCIUM  9.1 05/27/2024   GFR 73.08 07/29/2015   EGFR 69 08/03/2021   GFRNONAA >60 05/27/2024       Latest Ref Rng & Units 05/27/2024    2:41 AM 05/26/2024   12:27 PM 02/08/2023    6:00 AM  BMP  Glucose 70 - 99 mg/dL 877  893  886   BUN 8 - 23 mg/dL 10  7  8    Creatinine 0.44 - 1.00 mg/dL 8.95  9.09  9.02   Sodium 135 - 145 mmol/L 136  137  138   Potassium 3.5 - 5.1  mmol/L 4.1  4.4  3.5   Chloride 98 - 111 mmol/L 105  104  104   CO2 22 - 32 mmol/L 21  22  25    Calcium  8.9 - 10.3 mg/dL 9.1  9.0  9.0        Latest Ref Rng & Units 05/27/2024    2:41 AM 05/26/2024   12:27 PM 02/08/2023    6:00 AM  CBC  WBC 4.0 - 10.5 K/uL 6.3  6.9  6.3   Hemoglobin 12.0 - 15.0 g/dL 86.5  86.8  86.9   Hematocrit 36.0 - 46.0 % 40.5  39.4  38.6   Platelets 150 - 400 K/uL 175  181  179     Lab Results  Component Value Date   CHOL 123 05/27/2024   HDL 46 05/27/2024   LDLCALC 56 05/27/2024   TRIG 105 05/27/2024   CHOLHDL 2.7 05/27/2024    Lab Results  Component Value Date   TSH 1.507 02/07/2023    Lab Results  Component Value Date   HGBA1C 6.6 (H) 05/26/2024    Cardiac Panel (last 3 results) Recent Labs    05/26/24 1227 05/26/24 1427  TROPONINIHS 27* 98*    Intake/Output Summary (Last 24 hours) at 05/27/2024 0917 Last data filed at 05/27/2024 0533 Gross per 24 hour  Intake 156.28 ml  Output 400 ml  Net -243.72 ml    Net IO Since Admission: -243.72 mL [05/27/24 0917]  Tele/EKG/Cardiac  studies    EKG:  EKG 05/26/2024: Presenting ECG was normal sinus rhythm at the [facility].  ED visit EKG revealing T wave abnormality in anterior leads suggestive of subendocardial ischemia.  Serial EKGs reveal improvement in T wave abnormality.   EKG 05/27/2024: T wave abnormality more pronounced.  No change in tall R wave in V1 through V3 noted previously as a baseline.  Normal echocardiogram 04/03/2024 on Care Everywhere: Normal LV systolic function without wall motion normality at 55 to 60%. No significant valvular abnormality.   CTA head and neck 04/02/2024: Occlusion of right PCA at distal P1 and involving P2 PCA. Some but limited distal flow.  Moderate stenosis involving left proximal P2 PCA.  Poor flow and suspected moderate to severe stenosis involving left A3/84 ACA.   Mild to moderate calcified plaque involving the left cervical ICA causing mild stenosis     Radiology   DG Chest Port 1 View Result Date: 05/26/2024 CLINICAL DATA:  Chest pain. EXAM: PORTABLE CHEST 1 VIEW COMPARISON:  02/07/2023. FINDINGS: Cardiac silhouette is normal in size and configuration. Left coronary artery stents are new since the prior exam. No mediastinal or hilar masses. No evidence of adenopathy. Clear lungs.  No pleural effusion or pneumothorax. Skeletal structures are grossly intact. IMPRESSION: No active disease. Electronically Signed   By: Alm Parkins M.D.   On: 05/26/2024 15:10     Current Meds:     Current Facility-Administered Medications:    [EXPIRED] 0.9% sodium chloride  infusion, 3 mL/kg/hr, Intravenous, Continuous, Last Rate: 271.2 mL/hr at 05/27/24 0407, 3 mL/kg/hr at 05/27/24 0407 **FOLLOWED BY** 0.9% sodium chloride  infusion, 1 mL/kg/hr, Intravenous, Continuous, Zhao, Xika, NP, Last Rate: 90.4 mL/hr at 05/27/24 0509, 1 mL/kg/hr at 05/27/24 9490   acetaminophen  (TYLENOL ) tablet 650 mg, 650 mg, Oral, Q4H PRN, Zhao, Xika, NP   amLODipine  (NORVASC ) tablet 5 mg, 5 mg, Oral, Daily, Zhao, Xika,  NP, 5 mg at 05/26/24 1818   aspirin  EC tablet 81 mg, 81 mg, Oral, Daily, Zhao, Xika, NP  ezetimibe  (ZETIA ) tablet 10 mg, 10 mg, Oral, Daily, Terius Jacuinde, MD, 10 mg at 05/26/24 1818   ferrous sulfate  tablet 325 mg, 325 mg, Oral, Q breakfast, Zhao, Xika, NP   heparin  ADULT infusion 100 units/mL (25000 units/250mL), 950 Units/hr, Intravenous, Continuous, Ladona Heinz, MD, Last Rate: 9.5 mL/hr at 05/26/24 1617, 950 Units/hr at 05/26/24 1617   losartan  (COZAAR ) tablet 50 mg, 50 mg, Oral, Daily, Zhao, Xika, NP, 50 mg at 05/26/24 1818   metoprolol  succinate (TOPROL -XL) 24 hr tablet 12.5 mg, 12.5 mg, Oral, Daily, Zhao, Xika, NP, 12.5 mg at 05/26/24 1621   nitroGLYCERIN  50 mg in dextrose  5 % 250 mL (0.2 mg/mL) infusion, 0-30 mcg/min, Intravenous, Titrated, Zhao, Xika, NP, Last Rate: 9 mL/hr at 05/27/24 0510, 30 mcg/min at 05/27/24 0510   Oral care mouth rinse, 15 mL, Mouth Rinse, PRN, Ladona Heinz, MD   pantoprazole  (PROTONIX ) EC tablet 40 mg, 40 mg, Oral, Daily, Zhao, Xika, NP, 40 mg at 05/26/24 1818   rosuvastatin  (CRESTOR ) tablet 40 mg, 40 mg, Oral, Daily, Zhao, Xika, NP, 40 mg at 05/26/24 1818  Assessment & Plan .     1.  NSTEMI 2.  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  Familial hypercholesterolemia 4.  Primary hypertension  Recommendations: Patient had recurrence of chest pain this morning.  Will change her cardiac catheterization from a later time to next case, with recent history of stroke, not a candidate for Effient, if PCI done, will need Brilinta .  With regard to hypercholesterolemia, I have added Zetia  to her present medical regimen.  LDL is improved from greater than 200 mg to 124 by recent lipid profile testing on 03/26/2024 and LDL today is 56.  Labs reviewed, mild increased serum creatinine noted.  Will increase her IV hydration for now.  Echocardiogram pending.  With regard to hypertension, will address this postprocedure, presently on amlodipine  5 mg  daily, losartan  50 mg daily and metoprolol  succinate 12.5 mg daily.  For questions or updates, please contact [facility] Please consult [email or web address] for contact info under        Signed,   Heinz Ladona, MD, [facility] 05/27/2024, 9:17 AM [facility] [address] [place], [place] [address] Phone: [phone]. Fax:  [phone]

## 2024-05-27 NOTE — Plan of Care (Signed)
  Problem: Activity: Goal: Ability to tolerate increased activity will improve Outcome: Progressing   Problem: Cardiac: Goal: Ability to achieve and maintain adequate cardiovascular perfusion will improve Outcome: Progressing   Problem: Health Behavior/Discharge Planning: Goal: Ability to safely manage health-related needs after discharge will improve Outcome: Progressing   Problem: Health Behavior/Discharge Planning: Goal: Ability to manage health-related needs will improve Outcome: Progressing   Problem: Clinical Measurements: Goal: Ability to maintain clinical measurements within normal limits will improve Outcome: Progressing Goal: Diagnostic test results will improve Outcome: Progressing   Problem: Activity: Goal: Risk for activity intolerance will decrease Outcome: Progressing   Problem: Coping: Goal: Level of anxiety will decrease Outcome: Progressing

## 2024-05-28 ENCOUNTER — Inpatient Hospital Stay (HOSPITAL_COMMUNITY)

## 2024-05-28 ENCOUNTER — Telehealth (HOSPITAL_COMMUNITY): Payer: Self-pay | Admitting: Pharmacy Technician

## 2024-05-28 ENCOUNTER — Other Ambulatory Visit (HOSPITAL_COMMUNITY): Payer: Self-pay

## 2024-05-28 ENCOUNTER — Telehealth: Payer: Self-pay | Admitting: Cardiology

## 2024-05-28 DIAGNOSIS — R079 Chest pain, unspecified: Secondary | ICD-10-CM | POA: Diagnosis not present

## 2024-05-28 DIAGNOSIS — E7841 Elevated Lipoprotein(a): Secondary | ICD-10-CM

## 2024-05-28 DIAGNOSIS — E1122 Type 2 diabetes mellitus with diabetic chronic kidney disease: Secondary | ICD-10-CM

## 2024-05-28 DIAGNOSIS — N1831 Chronic kidney disease, stage 3a: Secondary | ICD-10-CM

## 2024-05-28 LAB — CBC
HCT: 41.8 % (ref 36.0–46.0)
Hemoglobin: 13.6 g/dL (ref 12.0–15.0)
MCH: 29.3 pg (ref 26.0–34.0)
MCHC: 32.5 g/dL (ref 30.0–36.0)
MCV: 90.1 fL (ref 80.0–100.0)
Platelets: 212 K/uL (ref 150–400)
RBC: 4.64 MIL/uL (ref 3.87–5.11)
RDW: 14.1 % (ref 11.5–15.5)
WBC: 7.3 K/uL (ref 4.0–10.5)
nRBC: 0 % (ref 0.0–0.2)

## 2024-05-28 LAB — BASIC METABOLIC PANEL WITH GFR
Anion gap: 9 (ref 5–15)
BUN: 11 mg/dL (ref 8–23)
CO2: 22 mmol/L (ref 22–32)
Calcium: 9.5 mg/dL (ref 8.9–10.3)
Chloride: 106 mmol/L (ref 98–111)
Creatinine, Ser: 1.09 mg/dL — ABNORMAL HIGH (ref 0.44–1.00)
GFR, Estimated: 57 mL/min — ABNORMAL LOW (ref >60)
Glucose, Bld: 111 mg/dL — ABNORMAL HIGH (ref 70–99)
Potassium: 4.2 mmol/L (ref 3.5–5.1)
Sodium: 137 mmol/L (ref 135–145)

## 2024-05-28 LAB — ECHOCARDIOGRAM COMPLETE
Area-P 1/2: 4.24 cm2
Calc EF: 57.5 %
Height: 66 in
S' Lateral: 2.6 cm
Single Plane A2C EF: 57.8 %
Single Plane A4C EF: 56.5 %
Weight: 2963.2 [oz_av]

## 2024-05-28 LAB — LIPOPROTEIN A (LPA): Lipoprotein (a): 298.8 nmol/L — ABNORMAL HIGH (ref <75.0)

## 2024-05-28 MED ORDER — NITROGLYCERIN 0.4 MG SL SUBL
0.4000 mg | SUBLINGUAL_TABLET | SUBLINGUAL | 3 refills | Status: AC | PRN
  Filled 2024-05-28: qty 25, 8d supply, fill #0

## 2024-05-28 MED ORDER — TICAGRELOR 90 MG PO TABS
90.0000 mg | ORAL_TABLET | Freq: Two times a day (BID) | ORAL | 2 refills | Status: DC
  Filled 2024-05-28: qty 60, 30d supply, fill #0

## 2024-05-28 MED ORDER — METOPROLOL SUCCINATE ER 25 MG PO TB24
12.5000 mg | ORAL_TABLET | Freq: Every day | ORAL | 0 refills | Status: DC
  Filled 2024-05-28: qty 15, 30d supply, fill #0

## 2024-05-28 MED ORDER — EMPAGLIFLOZIN 10 MG PO TABS
10.0000 mg | ORAL_TABLET | Freq: Every day | ORAL | 1 refills | Status: DC
  Filled 2024-05-28: qty 30, 30d supply, fill #0

## 2024-05-28 NOTE — Telephone Encounter (Signed)
 Pharmacy Patient Advocate Encounter   Received notification from Inpatient Request that prior authorization for Farxiga 10MG  tablets is required/requested.   Insurance verification completed.   The patient is insured through [facility] .   Per test claim: PA required; PA submitted to above mentioned insurance via CoverMyMeds Key/confirmation #/EOC [id number] Status is pending

## 2024-05-28 NOTE — Progress Notes (Signed)
 CARDIAC REHAB PHASE I   1400-1430 Patient given stent card. Discussed importance of taking Brilinta  as prescribed. Reviewed exercise guidelines, end points of exercise when to 911. Discussed use of sublingual nitroglycerin . Reviewed heart healthy diabetic diet information with the patient. Virginia [name] is interested in participating in outpatient phase 2 cardiac rehab. Talked with patient about restrictions.  [name] RN

## 2024-05-28 NOTE — Plan of Care (Signed)

## 2024-05-28 NOTE — Telephone Encounter (Signed)
 Pharmacy Patient Advocate Encounter  Received notification from [facility] that Prior Authorization for Farxiga 10MG  tablet  has been APPROVED from 05/28/2024 to 05/28/2025. Ran test claim, Copay is $4.00. This test claim was processed through [facility]- copay amounts may vary at other pharmacies due to pharmacy/plan contracts, or as the patient moves through the different stages of their insurance plan.   PA #/Case ID/Reference #: [id number]

## 2024-05-28 NOTE — Telephone Encounter (Signed)
   Transition of Care Follow-up Phone Call Request    Patient Name: Virginia [name] Date of Birth: 1961/10/04 Date of Encounter: 05/28/2024  Primary Care Provider:  Paseda, Folashade R, FNP Primary Cardiologist:  Gordy Bergamo, MD  [name] has been scheduled for a transition of care follow up appointment with a [facility] provider:  Lum Louis 7/31  Please reach out to [name] within 48 hours of discharge to confirm appointment and review transition of care protocol questionnaire. Anticipated discharge date: 7/22  Manuelita Rummer, NP  05/28/2024, 8:32 AM

## 2024-05-28 NOTE — Progress Notes (Signed)
 Discharge instructions (including medications) discussed with and copy provided to patient, verbalized understanding. PIV x2 removed. Patient awaiting TOC medications and a call back from her ride home.

## 2024-05-28 NOTE — Telephone Encounter (Signed)
 Patient Product/process development scientist completed.    The patient is insured through [employer].     Ran test claim for Farxiga 10 mg and Requires Prior Authorization  Ran test claim for Jardaince 10 mg and Requires Prior Authorization  This test claim was processed through [employer]- copay amounts may vary at other pharmacies due to [employer], or as the patient moves through the different stages of their insurance plan.     Reyes Sharps, CPHT Pharmacy Technician III Certified Patient Advocate [facility] Pharmacy Patient Advocate Team Direct Number: [phone]  Fax: [phone]

## 2024-05-28 NOTE — TOC Transition Note (Signed)
 Transition of Care [facility]) - Discharge Note   Patient Details  Name: Virginia [name] MRN: [id number] Date of Birth: 1961-01-30  [facility]) CM/SW Contact:  Roxie KANDICE Stain, RN Phone Number: 05/28/2024, 1:45 PM   Clinical Narrative:    [name] is stable to discharge home. Follow up apt on [facility]. No TOC needs at this time.   Final next level of care: Home/Self Care Barriers to Discharge: Barriers Resolved   Patient Goals and CMS Choice Patient states their goals for this hospitalization and ongoing recovery are:: return home          Discharge Placement             home          Discharge Plan and Services Additional resources added to the After Visit Summary for                                       Social Drivers of Health (SDOH) Interventions SDOH Screenings   Food Insecurity: No Food Insecurity (05/26/2024)  Housing: Low Risk  (05/26/2024)  Transportation Needs: No Transportation Needs (05/26/2024)  Utilities: Not At Risk (05/26/2024)  Depression (PHQ2-9): Low Risk  (08/03/2021)  Financial Resource Strain: Low Risk  (01/30/2024)   Received from [facility]  Social Connections: Unknown (03/03/2023)   Received from [facility]  Stress: No Stress Concern Present (04/04/2024)   Received from [facility]  Tobacco Use: Low Risk  (05/26/2024)     Readmission Risk Interventions    05/28/2024    1:44 PM  Readmission Risk Prevention Plan  Post Dischage Appt Complete  Medication Screening Complete  Transportation Screening Complete

## 2024-05-28 NOTE — Discharge Summary (Signed)
 Discharge Summary   Patient ID: Virginia [name] MRN: [id number]; DOB: 1961/03/18  Admit date: 05/26/2024 Discharge date: 05/28/2024  PCP:  Paseda, Folashade R, FNP   [facility] Providers Cardiologist:  Gordy Bergamo, MD     Discharge Diagnoses  Principal Problem:   Unstable angina [facility]) Active Problems:   Hyperlipidemia with target LDL less than 70   Diabetes mellitus type 2, controlled (HCC)   Essential hypertension   Diagnostic Studies/Procedures   Cath: 05/27/2024    Mid LAD-1 lesion is 99% stenosed.   Mid LAD-2 lesion is 80% stenosed.   Prox RCA lesion is 90% stenosed.   Prox RCA to Mid RCA lesion is 80% stenosed.   RPDA lesion is 50% stenosed.   Prox Cx to Mid Cx lesion is 80% stenosed.   Previously placed Prox LAD to Mid LAD stent of unknown type is  widely patent.   Previously placed Mid Cx to Dist Cx stent of unknown type is  widely patent.   A drug-eluting stent was successfully placed using a STENT SYNERGY XD 2.50X38.   A drug-eluting stent was successfully placed using a STENT SYNERGY XD 3.0X12.   A drug-eluting stent was successfully placed using a STENT SYNERGY XD 2.50X24.   Post intervention, there is a 0% residual stenosis.   Post intervention, there is a 0% residual stenosis.   Post intervention, there is a 0% residual stenosis.   Post intervention, there is a 0% residual stenosis.   Post intervention, there is a 0% residual stenosis.   Post intervention, there is a 0% residual stenosis.   Post intervention, there is a 0% residual stenosis.   LV end diastolic pressure is normal.   Recommend uninterrupted dual antiplatelet therapy with Aspirin  81mg  daily and Ticagrelor  90mg  twice daily for a minimum of 12 months (ACS-Class I recommendation).   Severe 3 vessel obstructive CAD. Prior stents in the proximal LAD and mid LCx were patent. New disease in the mid to distal LAD, mid LCx and mid RCA Normal LVEDP Successful PCI of the mid to distal LAD with  DES Successful PCI of the mid LCx with DES Successful PCI of the mid RCA with DES     Plan: DAPT for one year.   Diagnostic Dominance: Right  Intervention    Echo: 05/28/2024  IMPRESSIONS     1. Left ventricular ejection fraction, by estimation, is 55 to 60%. The  left ventricle has normal function. The left ventricle has no regional  wall motion abnormalities. There is mild concentric left ventricular  hypertrophy. Left ventricular diastolic  parameters were normal.   2. Right ventricular systolic function is normal. The right ventricular  size is normal.   3. The mitral valve is normal in structure. Trivial mitral valve  regurgitation. No evidence of mitral stenosis.   4. The aortic valve is tricuspid. Aortic valve regurgitation is not  visualized. No aortic stenosis is present.   5. The inferior vena cava is normal in size with greater than 50%  respiratory variability, suggesting right atrial pressure of 3 mmHg.   Comparison(s): No significant change from prior study.   FINDINGS   Left Ventricle: Left ventricular ejection fraction, by estimation, is 55  to 60%. The left ventricle has normal function. The left ventricle has no  regional wall motion abnormalities. The left ventricular internal cavity  size was normal in size. There is   mild concentric left ventricular hypertrophy. Left ventricular diastolic  parameters were normal.   Right  Ventricle: The right ventricular size is normal. No increase in  right ventricular wall thickness. Right ventricular systolic function is  normal.   Left Atrium: Left atrial size was normal in size.   Right Atrium: Right atrial size was normal in size.   Pericardium: There is no evidence of pericardial effusion.   Mitral Valve: The mitral valve is normal in structure. Trivial mitral  valve regurgitation. No evidence of mitral valve stenosis.   Tricuspid Valve: The tricuspid valve is normal in structure. Tricuspid  valve  regurgitation is not demonstrated. No evidence of tricuspid  stenosis.   Aortic Valve: The aortic valve is tricuspid. Aortic valve regurgitation is  not visualized. No aortic stenosis is present.   Pulmonic Valve: The pulmonic valve was normal in structure. Pulmonic valve  regurgitation is not visualized. No evidence of pulmonic stenosis.   Aorta: The aortic root is normal in size and structure.   Venous: The inferior vena cava is normal in size with greater than 50%  respiratory variability, suggesting right atrial pressure of 3 mmHg.   IAS/Shunts: The atrial septum is grossly normal.  _____________   History of Present Illness   Virginia [name] is a 63 y.o. female with DM, hypertension, f CAD with history of angioplasty to CX with BMS in 2016 and DES to proximal LAD in January 2022 familial hypercholesterolemia who had refused PCSK9 inhibitor presently on high intensity Crestor  40 mg daily, recent admission to [facility] on 04/02/2024 with TIA involving right PCA distal P1 stenosis, stroke felt to be due to small vessel disease, discharged to rehab and has returned home and has essentially recuperated from most of the right upper and lower extremity weakness.   Had been doing well until about a week prior and started noticing marked dyspnea on climbing up to her office at home which was new to her.  She was fairly active and stated that she used to exercise regularly and go for a walk every day and also did all activities of daily living without any limitations.  Since stroke 2 months ago she has felt slightly weaker but otherwise had been doing well.  She had to rest after climbing 6 steps and after going to the top of the stairs, she had to rest for most an hour before she felt well.   The morning of admission she broke out in sweat, walked to her office sat down and relaxed started having chest pain described as precordial heaviness and burning type sensation for which she took  nitroglycerin .  There was no relief of chest discomfort, pain was intense 9-10 out of 10 in intensity and felt similar to when she had prior NSTEMI hence called the EMS.  With nitroglycerin  sublingual, pain level has come down to 2-3, she felt comfortable in the ED but stated that she was afraid to walk even to the bathroom without having to precipitate chest pain. Admitted for further management with plans for cardiac cath.    Hospital Course    NSTEMI -- hsTn 27>>98, underwent cardiac catheterization noted above with severe three-vessel CAD.  Underwent successful PCI/DES x1 m/dLAD, DES x1 to mid circumflex and DES x1 to mid RCA.  Recommendations for DAPT with aspirin /Brilinta  for at least 1 year.  No recurrent chest pain.  Seen by cardiac rehab. Echo 7/22 showed LVEF of 55-60%, mld concentric LVH, normal RV -- Continue aspirin , Brilinta , Crestor  40 mg daily, metoprolol  XL 12.5 mg daily, losartan  50 mg daily, Norvasc  5  mg daily  HLD -- LDL 56, HDL 46, LP(a) 298.8 -- Continue Crestor  40 mg daily, Zetia  10mg  daily of note has refused PCSK9 in the past. Now agreeable, referral to lipid clinic sent   HTN -- Initially poorly controlled on admission.  Now improved --Continue Toprol  XL 12.5 mg daily, losartan  50 mg daily, Norvasc  5 mg daily  Diabetes -- Hgb A1c 6.6 -- add jardiance  10mg  daily   Hx of CVA -- on ASA, statin   General: Well developed, well nourished, female appearing in no acute distress. Head: Normocephalic, atraumatic.  Neck: Supple without bruits, JVD. Lungs:  Resp regular and unlabored, CTA. Heart: RRR, S1, S2, no S3, S4, or murmur; no rub. Abdomen: Soft, non-tender, non-distended with normoactive bowel sounds. No hepatomegaly. No rebound/guarding. No obvious abdominal masses. Extremities: No clubbing, cyanosis, edema. Distal pedal pulses are 2+ bilaterally. Right femoral cath site stable without bruising or hematoma Neuro: Alert and oriented X 3. Moves all extremities  spontaneously. Psych: Normal affect.   Did the patient have an acute coronary syndrome (MI, NSTEMI, STEMI, etc) this admission?:  Yes                               AHA/ACC ACS Clinical Performance & Quality Measures: Aspirin  prescribed? - Yes ADP Receptor Inhibitor (Plavix/Clopidogrel, Brilinta /Ticagrelor  or Effient/Prasugrel) prescribed (includes medically managed patients)? - Yes Beta Blocker prescribed? - Yes High Intensity Statin (Lipitor  40-80mg  or Crestor  20-40mg ) prescribed? - Yes EF assessed during THIS hospitalization? - Yes For EF <40%, was ACEI/ARB prescribed? - Not Applicable (EF >/= 40%) For EF <40%, Aldosterone Antagonist (Spironolactone or Eplerenone) prescribed? - Not Applicable (EF >/= 40%) Cardiac Rehab Phase II ordered (including medically managed patients)? - Yes       The patient will be scheduled for a TOC follow up appointment in 10-14 days.  A message has been sent to the [facility] and Scheduling Pool at the office where the patient should be seen for follow up.  _____________  Discharge Vitals Blood pressure 136/69, pulse 75, temperature 98.7 F (37.1 C), temperature source Oral, resp. rate (!) 22, height 5' 6 (1.676 m), weight 84 kg, last menstrual period 12/29/2015, SpO2 98%.  Filed Weights   05/26/24 1522 05/27/24 0430 05/28/24 0346  Weight: 90.4 kg 85 kg 84 kg    Labs & Radiologic Studies  CBC Recent Labs    05/27/24 0241 05/28/24 0218  WBC 6.3 7.3  HGB 13.4 13.6  HCT 40.5 41.8  MCV 90.0 90.1  PLT 175 212   Basic Metabolic Panel Recent Labs    92/78/74 0241 05/28/24 0218  NA 136 137  K 4.1 4.2  CL 105 106  CO2 21* 22  GLUCOSE 122* 111*  BUN 10 11  CREATININE 1.04* 1.09*  CALCIUM  9.1 9.5   Liver Function Tests Recent Labs    05/26/24 1826  AST 16  ALT 12  ALKPHOS 90  BILITOT 0.7  PROT 7.5  ALBUMIN 4.0   Recent Labs    05/26/24 1826  LIPASE 33   High Sensitivity Troponin:   Recent Labs  Lab 05/26/24 1227  05/26/24 1427  TROPONINIHS 27* 98*    No results for input(s): TRNPT in the last 720 hours.  BNP Invalid input(s): POCBNP No results for input(s): PROBNP in the last 72 hours.  No results for input(s): BNP in the last 72 hours.  D-Dimer No results for input(s): DDIMER in the last  72 hours. Hemoglobin A1C Recent Labs    05/26/24 1826  HGBA1C 6.6*   Fasting Lipid Panel Recent Labs    05/27/24 0241  CHOL 123  HDL 46  LDLCALC 56  TRIG 105  CHOLHDL 2.7   Lipoprotein (a)  Date/Time Value Ref Range Status  05/27/2024 02:41 AM 298.8 (H) <75.0 nmol/L Final    Comment:    (NOTE) **Results verified by repeat testing** This test was developed and its performance characteristics determined by [facility]. It has not been cleared or approved by the Food and Drug Administration. Note:  Values greater than or equal to 75.0 nmol/L may       indicate an independent risk factor for CHD,       but must be evaluated with caution when applied       to non-Caucasian populations due to the       influence of genetic factors on Lp(a) across       ethnicities. Performed At: [facility] [address] [place], [place] [id number] Jennette Shorter MD Ey:[id number]     Thyroid  Function Tests No results for input(s): TSH, T4TOTAL, T3FREE, THYROIDAB in the last 72 hours.  Invalid input(s): FREET3 _____________  ECHOCARDIOGRAM COMPLETE Result Date: 05/28/2024    ECHOCARDIOGRAM REPORT   Patient Name:   Virginia [name] Date of Exam: 05/28/2024 Medical Rec #:  [id number]       Height:       66.0 in Accession #:    [id number]      Weight:       185.2 lb Date of Birth:  1961-09-20        BSA:          1.936 m Patient Age:    63 years        BP:           136/69 mmHg Patient Gender: F               HR:           68 bpm. Exam Location:  Inpatient Procedure: 2D Echo, Cardiac Doppler and Color Doppler (Both Spectral and Color            Flow Doppler were utilized during procedure).  Indications:    Chest Pain  History:        Patient has prior history of Echocardiogram examinations.                 Signs/Symptoms:Chest Pain; Risk Factors:Hypertension.  Sonographer:    Vella Key Referring Phys: XIKA ZHAO IMPRESSIONS  1. Left ventricular ejection fraction, by estimation, is 55 to 60%. The left ventricle has normal function. The left ventricle has no regional wall motion abnormalities. There is mild concentric left ventricular hypertrophy. Left ventricular diastolic parameters were normal.  2. Right ventricular systolic function is normal. The right ventricular size is normal.  3. The mitral valve is normal in structure. Trivial mitral valve regurgitation. No evidence of mitral stenosis.  4. The aortic valve is tricuspid. Aortic valve regurgitation is not visualized. No aortic stenosis is present.  5. The inferior vena cava is normal in size with greater than 50% respiratory variability, suggesting right atrial pressure of 3 mmHg. Comparison(s): No significant change from prior study. FINDINGS  Left Ventricle: Left ventricular ejection fraction, by estimation, is 55 to 60%. The left ventricle has normal function. The left ventricle has no regional wall motion abnormalities. The left ventricular internal cavity size was normal in size. There  is  mild concentric left ventricular hypertrophy. Left ventricular diastolic parameters were normal. Right Ventricle: The right ventricular size is normal. No increase in right ventricular wall thickness. Right ventricular systolic function is normal. Left Atrium: Left atrial size was normal in size. Right Atrium: Right atrial size was normal in size. Pericardium: There is no evidence of pericardial effusion. Mitral Valve: The mitral valve is normal in structure. Trivial mitral valve regurgitation. No evidence of mitral valve stenosis. Tricuspid Valve: The tricuspid valve is normal in structure. Tricuspid valve regurgitation is not demonstrated. No evidence of  tricuspid stenosis. Aortic Valve: The aortic valve is tricuspid. Aortic valve regurgitation is not visualized. No aortic stenosis is present. Pulmonic Valve: The pulmonic valve was normal in structure. Pulmonic valve regurgitation is not visualized. No evidence of pulmonic stenosis. Aorta: The aortic root is normal in size and structure. Venous: The inferior vena cava is normal in size with greater than 50% respiratory variability, suggesting right atrial pressure of 3 mmHg. IAS/Shunts: The atrial septum is grossly normal.  LEFT VENTRICLE PLAX 2D LVIDd:         3.70 cm     Diastology LVIDs:         2.60 cm     LV e' medial:    8.16 cm/s LV PW:         1.10 cm     LV E/e' medial:  10.1 LV IVS:        1.10 cm     LV e' lateral:   10.00 cm/s LVOT diam:     1.40 cm     LV E/e' lateral: 8.2 LV SV:         34 LV SV Index:   18 LVOT Area:     1.54 cm  LV Volumes (MOD) LV vol d, MOD A2C: 60.7 ml LV vol d, MOD A4C: 73.4 ml LV vol s, MOD A2C: 25.6 ml LV vol s, MOD A4C: 31.9 ml LV SV MOD A2C:     35.1 ml LV SV MOD A4C:     73.4 ml LV SV MOD BP:      38.6 ml RIGHT VENTRICLE RV Basal diam:  2.60 cm RV S prime:     13.40 cm/s TAPSE (M-mode): 1.9 cm LEFT ATRIUM             Index        RIGHT ATRIUM           Index LA diam:        3.00 cm 1.55 cm/m   RA Area:     12.70 cm LA Vol (A2C):   48.3 ml 24.95 ml/m  RA Volume:   27.70 ml  14.31 ml/m LA Vol (A4C):   49.1 ml 25.37 ml/m LA Biplane Vol: 50.8 ml 26.25 ml/m  AORTIC VALVE LVOT Vmax:   113.00 cm/s LVOT Vmean:  79.300 cm/s LVOT VTI:    0.224 m  AORTA Ao Root diam: 2.90 cm MITRAL VALVE MV Area (PHT): 4.24 cm    SHUNTS MV Decel Time: 179 msec    Systemic VTI:  0.22 m MV E velocity: 82.30 cm/s  Systemic Diam: 1.40 cm MV A velocity: 88.60 cm/s MV E/A ratio:  0.93 Stanly Leavens MD Electronically signed by Stanly Leavens MD Signature Date/Time: 05/28/2024/12:57:53 PM    Final    CARDIAC CATHETERIZATION Result Date: 05/27/2024   Mid LAD-1 lesion is 99% stenosed.    Mid LAD-2 lesion is 80% stenosed.   Prox RCA  lesion is 90% stenosed.   Prox RCA to Mid RCA lesion is 80% stenosed.   RPDA lesion is 50% stenosed.   Prox Cx to Mid Cx lesion is 80% stenosed.   Previously placed Prox LAD to Mid LAD stent of unknown type is  widely patent.   Previously placed Mid Cx to Dist Cx stent of unknown type is  widely patent.   A drug-eluting stent was successfully placed using a STENT SYNERGY XD 2.50X38.   A drug-eluting stent was successfully placed using a STENT SYNERGY XD 3.0X12.   A drug-eluting stent was successfully placed using a STENT SYNERGY XD 2.50X24.   Post intervention, there is a 0% residual stenosis.   Post intervention, there is a 0% residual stenosis.   Post intervention, there is a 0% residual stenosis.   Post intervention, there is a 0% residual stenosis.   Post intervention, there is a 0% residual stenosis.   Post intervention, there is a 0% residual stenosis.   Post intervention, there is a 0% residual stenosis.   LV end diastolic pressure is normal.   Recommend uninterrupted dual antiplatelet therapy with Aspirin  81mg  daily and Ticagrelor  90mg  twice daily for a minimum of 12 months (ACS-Class I recommendation). Severe 3 vessel obstructive CAD. Prior stents in the proximal LAD and mid LCx were patent. New disease in the mid to distal LAD, mid LCx and mid RCA Normal LVEDP Successful PCI of the mid to distal LAD with DES Successful PCI of the mid LCx with DES Successful PCI of the mid RCA with DES Plan: DAPT for one year.   DG Chest Port 1 View Result Date: 05/26/2024 CLINICAL DATA:  Chest pain. EXAM: PORTABLE CHEST 1 VIEW COMPARISON:  02/07/2023. FINDINGS: Cardiac silhouette is normal in size and configuration. Left coronary artery stents are new since the prior exam. No mediastinal or hilar masses. No evidence of adenopathy. Clear lungs.  No pleural effusion or pneumothorax. Skeletal structures are grossly intact. IMPRESSION: No active disease. Electronically Signed    By: Alm Parkins M.D.   On: 05/26/2024 15:10    Disposition Pt is being discharged home today in good condition.  Follow-up Plans & Appointments  Discharge Instructions     AMB Referral to Cardiac Rehabilitation - Phase II   Complete by: As directed    Diagnosis:  Coronary Stents NSTEMI     After initial evaluation and assessments completed: Virtual Based Care may be provided alone or in conjunction with Phase 2 Cardiac Rehab based on patient barriers.: Yes   Intensive Cardiac Rehabilitation (ICR) [facility] location only OR Traditional Cardiac Rehabilitation (TCR) *If criteria for ICR are not met will enroll in TCR ([facility] only): Yes   AMB Referral to [facility]   Complete by: As directed    Post-ACS PCSK9i   Reason For Referral: Lipids   Call MD for:  difficulty breathing, headache or visual disturbances   Complete by: As directed    Call MD for:  persistant dizziness or light-headedness   Complete by: As directed    Call MD for:  redness, tenderness, or signs of infection (pain, swelling, redness, odor or green/yellow discharge around incision site)   Complete by: As directed    Diet - low sodium heart healthy   Complete by: As directed    Discharge instructions   Complete by: As directed    Groin Site Care Refer to this sheet in the next few weeks. These instructions provide you with information on caring for  yourself after your procedure. Your caregiver may also give you more specific instructions. Your treatment has been planned according to current medical practices, but problems sometimes occur. Call your caregiver if you have any problems or questions after your procedure. HOME CARE INSTRUCTIONS You may shower 24 hours after the procedure. Remove the bandage (dressing) and gently wash the site with plain soap and water. Gently pat the site dry.  Do not apply powder or lotion to the site.  Do not sit in a bathtub, swimming pool, or whirlpool for 5 to 7 days.  No bending,  squatting, or lifting anything over 10 pounds (4.5 kg) as directed by your caregiver.  Inspect the site at least twice daily.  Do not drive home if you are discharged the same day of the procedure. Have someone else drive you.  You may drive 24 hours after the procedure unless otherwise instructed by your caregiver.  What to expect: Any bruising will usually fade within 1 to 2 weeks.  Blood that collects in the tissue (hematoma) may be painful to the touch. It should usually decrease in size and tenderness within 1 to 2 weeks.  SEEK IMMEDIATE MEDICAL CARE IF: You have unusual pain at the groin site or down the affected leg.  You have redness, warmth, swelling, or pain at the groin site.  You have drainage (other than a small amount of blood on the dressing).  You have chills.  You have a fever or persistent symptoms for more than 72 hours.  You have a fever and your symptoms suddenly get worse.  Your leg becomes pale, cool, tingly, or numb.  You have heavy bleeding from the site. Hold pressure on the site. SABRA  PLEASE DO NOT MISS ANY DOSES OF YOUR BRILINTA !!!!! Also keep a log of you blood pressures and bring back to your follow up appt. Please call the office with any questions.   Patients taking blood thinners should generally stay away from medicines like ibuprofen , Advil , Motrin , naproxen , and Aleve  due to risk of stomach bleeding. You may take Tylenol  as directed or talk to your primary doctor about alternatives.   PLEASE ENSURE THAT YOU DO NOT RUN OUT OF YOUR BRILINTA . This medication is very important to remain on for at least one year. IF you have issues obtaining this medication due to cost please CALL the office 3-5 business days prior to running out in order to prevent missing doses of this medication.   Increase activity slowly   Complete by: As directed        Discharge Medications Allergies as of 05/28/2024   No Known Allergies      Medication List     STOP taking  these medications    isosorbide  mononitrate 30 MG 24 hr tablet Commonly known as: IMDUR        TAKE these medications    acetaminophen  500 MG tablet Commonly known as: TYLENOL  Take 1 tablet (500 mg total) by mouth every 6 (six) hours as needed. What changed: reasons to take this   amLODipine  5 MG tablet Commonly known as: NORVASC  Take 5 mg by mouth daily.   aspirin  EC 81 MG tablet Take 1 tablet (81 mg total) by mouth daily.   empagliflozin  10 MG Tabs tablet Commonly known as: Jardiance  Take 1 tablet (10 mg total) by mouth daily before breakfast.   ezetimibe  10 MG tablet Commonly known as: ZETIA  Take 10 mg by mouth at bedtime.   losartan  50 MG tablet Commonly known  as: COZAAR  Take 50 mg by mouth daily.   metoprolol  succinate 25 MG 24 hr tablet Commonly known as: TOPROL -XL Take 0.5 tablets (12.5 mg total) by mouth daily. Start taking on: May 29, 2024   nitroGLYCERIN  0.4 MG SL tablet Commonly known as: NITROSTAT  Place 0.4 mg under the tongue every 5 (five) minutes as needed for chest pain.   omeprazole  20 MG capsule Commonly known as: PRILOSEC Take 1 capsule (20 mg total) by mouth daily.   rosuvastatin  40 MG tablet Commonly known as: CRESTOR  Take 40 mg by mouth daily.   ticagrelor  90 MG Tabs tablet Commonly known as: BRILINTA  Take 1 tablet (90 mg total) by mouth 2 (two) times daily.   traMADol  50 MG tablet Commonly known as: ULTRAM  Take 50 mg by mouth every 6 (six) hours as needed.         Outstanding Labs/Studies  BMET at follow up Referral to lipid clinic   Duration of Discharge Encounter: APP Time: 25 minutes   Signed, Manuelita Rummer, NP 05/28/2024, 1:39 PM

## 2024-05-29 ENCOUNTER — Telehealth: Payer: Self-pay

## 2024-05-29 DIAGNOSIS — I214 Non-ST elevation (NSTEMI) myocardial infarction: Secondary | ICD-10-CM

## 2024-05-29 DIAGNOSIS — I200 Unstable angina: Secondary | ICD-10-CM

## 2024-05-29 MED ORDER — EZETIMIBE 10 MG PO TABS: 10.0000 mg | ORAL_TABLET | Freq: Every day | ORAL | 1 refills | Status: DC

## 2024-05-29 MED ORDER — METOPROLOL SUCCINATE ER 25 MG PO TB24
25.0000 mg | ORAL_TABLET | Freq: Every day | ORAL | Status: DC
Start: 2024-05-29 — End: 2024-06-27

## 2024-05-29 MED FILL — Verapamil HCl IV Soln 2.5 MG/ML: INTRAVENOUS | Qty: 2 | Status: AC

## 2024-05-29 NOTE — Telephone Encounter (Signed)
 Attempted pt on both numbers.  Home number, voicemail full unable to leave a message.  Cell number no answer and no voicemail.

## 2024-05-29 NOTE — Transitions of Care (Post Inpatient/ED Visit) (Signed)
 05/29/2024  Name: Virginia [name] MRN: [id number] DOB: 1960-12-25  Today's TOC FU Call Status: Today's TOC FU Call Status:: Successful TOC FU Call Completed TOC FU Call Complete Date: 05/29/24 Patient's Name and Date of Birth confirmed.  Transition Care Management Follow-up Telephone Call Date of Discharge: 05/28/24 Discharge Facility: [facility]) Type of Discharge: Inpatient Admission Primary Inpatient Discharge Diagnosis:: Unstable angina How have you been since you were released from the hospital?: Better (Patient denies having any chest pain, pressure/ discomfort, shortness of breath. Patient states she is still somewhat fatigued.) Any questions or concerns?: Yes Patient Questions/Concerns:: patient states she broke out in a rash on her right wrist, forearm, hand and on the right lower leg around ankle area last night.  Patient states it was itching however now it is not and the rash is now gone since waking up this morning. Patient states she needs a refill on her zetia . She states she has enough to last her until 06/02/24. Patient Questions/Concerns Addressed: Other: (patients concerns discussed and addressed.)  Items Reviewed: Did you receive and understand the discharge instructions provided?: Yes Medications obtained,verified, and reconciled?: Yes (Medications Reviewed) Any new allergies since your discharge?: No Dietary orders reviewed?: Yes Type of Diet Ordered:: low sodium heart healthy Do you have support at home?: Yes People in Home [RPT]: spouse Name of Support/Comfort Primary Source: Adina Shove - spouse  Medications Reviewed Today: Medications Reviewed Today     Reviewed by Remmington Teters E, RN (Registered Nurse) on 05/29/24 at 1158  Med List Status: <None>   Medication Order Taking? Sig Documenting Provider Last Dose Status Informant  acetaminophen  (TYLENOL ) 500 MG tablet [id number] Yes Take 1 tablet (500 mg total) by mouth every 6 (six) hours as needed. Tilford Bertram HERO, FNP  Active Self, Pharmacy Records  amLODipine  (NORVASC ) 5 MG tablet [id number] Yes Take 5 mg by mouth daily. [provider]  Active Self, Pharmacy Records  aspirin  EC 81 MG tablet [id number] Yes Take 1 tablet (81 mg total) by mouth daily. Stroud, Natalie M, FNP  Active Self, Pharmacy Records  empagliflozin  (JARDIANCE ) 10 MG TABS tablet [id number] Yes Take 1 tablet (10 mg total) by mouth daily before breakfast. Irving Manuelita NOVAK, NP  Active   ezetimibe  (ZETIA ) 10 MG tablet [id number] Yes Take 10 mg by mouth at bedtime. [provider]  Active Self, Pharmacy Records    Discontinued 11/26/20 1412 (Stop Taking at Discharge) losartan  (COZAAR ) 50 MG tablet [id number] Yes Take 50 mg by mouth daily. [provider]  Active Self, Pharmacy Records  metoprolol  succinate (TOPROL -XL) 25 MG 24 hr tablet [id number] Yes Take 0.5 tablets (12.5 mg total) by mouth daily. Gongora Manuelita NOVAK, NP  Active     Discontinued 11/26/20 1412 (Stop Taking at Discharge) nitroGLYCERIN  (NITROSTAT ) 0.4 MG SL tablet [id number] Yes Place 1 tablet (0.4 mg total) under the tongue every 5 (five) minutes as needed for chest pain. Ladona Heinz, MD  Active   omeprazole  (PRILOSEC) 20 MG capsule [id number] Yes Take 1 capsule (20 mg total) by mouth daily. Myrna Camelia HERO, NP  Active Self, Pharmacy Records  rosuvastatin  (CRESTOR ) 40 MG tablet [id number] Yes Take 40 mg by mouth daily. [provider]  Active Self, Pharmacy Records  ticagrelor  (BRILINTA ) 90 MG TABS tablet [id number] Yes Take 1 tablet (90 mg total) by mouth 2 (two) times daily. Narramore Manuelita NOVAK, NP  Active   traMADol  (ULTRAM ) 50 MG tablet [id number]  Take 50 mg by mouth every 6 (  six) hours as needed.  Patient not taking: Reported on 05/29/2024   [provider]  Active Self, Pharmacy Records           Med Note [facility], ALFREIDA LITTIE Repress May 26, 2024  4:10 PM) Patient stated she is out of this medication and needs a refill.             Home Care and Equipment/Supplies: Were Home Health Services Ordered?: No Any new equipment or medical supplies ordered?: No  Functional Questionnaire: Do you need assistance with bathing/showering or dressing?: No Do you need assistance with meal preparation?: No Do you need assistance with eating?: No Do you have difficulty maintaining continence: No Do you need assistance with getting out of bed/getting out of a chair/moving?: No Do you have difficulty managing or taking your medications?: No  Follow up appointments reviewed: PCP Follow-up appointment confirmed?: Yes Date of PCP follow-up appointment?: 06/07/24 (patients hospital follow up/ new patient appointment scheduled by care guide.) Follow-up Provider: Folashade Paseda, FNP Specialist Hospital Follow-up appointment confirmed?: Yes Date of Specialist follow-up appointment?: 06/04/24 Follow-Up Specialty Provider:: Geraldine Eleanor BIRCH, RPH-CPP on 06/04/24  and with Rana Lum LITTIE, NP 06/06/24 Do you need transportation to your follow-up appointment?: Yes Transportation Need Intervention Addressed By:: AMB Referral For SDOH Needs (referred to careguide for transportation assistance/ resources.) Do you understand care options if your condition(s) worsen?: Yes-patient verbalized understanding  SDOH Interventions Today    Flowsheet Row Most Recent Value  SDOH Interventions   Food Insecurity Interventions Intervention Not Indicated  Housing Interventions Intervention Not Indicated  Transportation Interventions Intervention Not Indicated  Utilities Interventions Intervention Not Indicated    Goals Addressed             This Visit's Progress    VBCI Transitions of Care (TOC) Care Plan       Problems:  Recent Hospitalization for treatment of Unstable angina/ NSTEMI Knowledge Deficit Related to angina/ NSTEMI, medication management and SDOH barrier: transportation  Goal:  Over the next 30 days, the patient will not  experience hospital readmission  Interventions:  Transitions of Care: Community Resource Referral Made to address Transportation Doctor Visits  - discussed the importance of doctor visits Arranged PCP follow-up within 12-14 days (Care Guide Scheduled) Post discharge activity limitations prescribed by provider reviewed Reviewed Signs and symptoms of infection Referred to care guide regarding transportation assistance need. Confirmed patient has a plan b option to get to upcoming cardiovascular visit on 06/04/24 and primary care hospital follow up visit on 06/07/24 while working on more permanent transportation arrangements.  Elicited assistance from care guide to arrange patient hospital follow up visit with new primary care provider  Message sent to cardiovascular provider, Lum Rana, FNP informing her patient needs refill on prescribed Zetia  and that patient took 1 full 25 mg metoprolol  tablet today instead of the prescribed 1/2 tablet.   Reviewed hospital discharge instructions and groin care instructions status post cath.  Reviewed upcoming provider visits Advised to keep follow up visits with provider.  Advised to call 911 for severe symptoms regarding SOB, chest pain, stroke- like symptoms Advised to call primary care provider for mild/ moderate symptoms/ concerns Stroke like symptoms reviewed.  Offered 30 DAY TOC program to patient. Advised patient that she can also contact her insurance carrier [employer] medicaid regarding transportation assistance/ resources.   Patient Self Care Activities:  Attend all scheduled provider appointments Call pharmacy for medication refills 3-7 days in advance of running out  of medications Call provider office for new concerns or questions  Notify RN Care Manager of TOC call rescheduling needs Take medications as prescribed   Call 911 for severe symptoms such as shortness of breath, chest pain, stroke like symptoms.  Plan:  Telephone follow up  appointment with care management team member scheduled for:  06/05/24 at 2 pm        Discussed and offered 30 day TOC program.  Patient verbally agreed to 30 day TOC follow up.   The patient has been provided with contact information for the care management team and has been advised to call with any health -related questions or concerns.  The patient verbalized understanding with current plan of care.  The patient is directed to their insurance card regarding availability of benefits coverage.    Arvin Seip RN, BSN, CCM [employer], Population Health Case Manager Phone: [phone]

## 2024-05-29 NOTE — Patient Instructions (Signed)
 Visit Information  Thank you for taking time to visit with me today. Please don't hesitate to contact me if I can be of assistance to you before our next scheduled telephone appointment.  Our next appointment is by telephone on 06/05/24 at 2 pm  Following is a copy of your care plan:   Goals Addressed             This Visit's Progress    VBCI Transitions of Care (TOC) Care Plan       Problems:  Recent Hospitalization for treatment of Unstable angina/ NSTEMI Knowledge Deficit Related to angina/ NSTEMI, medication management and SDOH barrier: transportation  Goal:  Over the next 30 days, the patient will not experience hospital readmission  Interventions:  Transitions of Care: Community Resource Referral Made to address Transportation Doctor Visits  - discussed the importance of doctor visits Arranged PCP follow-up within 12-14 days (Care Guide Scheduled) Post discharge activity limitations prescribed by provider reviewed Reviewed Signs and symptoms of infection Referred to care guide regarding transportation assistance need. Confirmed patient has a plan b option to get to upcoming cardiovascular visit on 06/04/24 and primary care hospital follow up visit on 06/07/24 while working on more permanent transportation arrangements.  Elicited assistance from care guide to arrange patient hospital follow up visit with new primary care provider  Message sent to cardiovascular provider, Lum Louis, FNP informing her patient needs refill on prescribed Zetia  and that patient took 1 full 25 mg metoprolol  tablet today instead of the prescribed 1/2 tablet.   Reviewed hospital discharge instructions and groin care instructions status post cath.  Reviewed upcoming provider visits Advised to keep follow up visits with provider.  Advised to call 911 for severe symptoms regarding SOB, chest pain, stroke- like symptoms Advised to call primary care provider for mild/ moderate symptoms/  concerns Stroke like symptoms reviewed.  Offered 30 DAY TOC program to patient. Advised patient that she can also contact her insurance carrier [employer] med[employer] regarding transportation assistance/ resources.   Patient Self Care Activities:  Attend all scheduled provider appointments Call pharmacy for medication refills 3-7 days in advance of running out of medications Call provider office for new concerns or questions  Notify RN Care Manager of TOC call rescheduling needs Take medications as prescribed   Call 911 for severe symptoms such as shortness of breath, chest pain, stroke like symptoms.  Plan:  Telephone follow up appointment with care management team member scheduled for:  06/05/24 at 2 pm        The patient verbalized understanding of instructions, educational materials, and care plan provided today and agreed to receive a mailed copy of patient instructions, educational materials, and care plan.   The patient has been provided with contact information for the care management team and has been advised to call with any health related questions or concerns.   Please call the care guide team at [phone] if you need to cancel or reschedule your appointment.   Please call the Suicide and Crisis Lifeline: 988 call 1-800-273-TALK (toll free, 24 hour hotline) if you are experiencing a Mental Health or Behavioral Health Crisis or need someone to talk to.  Arvin Seip RN, BSN, CCM [employer], Population Health Case Manager Phone: [phone]

## 2024-05-30 NOTE — Telephone Encounter (Signed)
 Patient contacted regarding discharge from [facility] on May 28, 2024.  Patient understands to follow up with provider Virginia [name] on July 31 at 2:20 pm at [facility] location.  Patient understands discharge instructions? Yes Patient understands medications and regiment? Yes Patient understands to bring all medications to this visit? Yes  Ask patient:  Are you enrolled in My Chart No.  If no ask patient if they would like to enroll. MyChart not active but patient wishes to enroll. Link to enroll sent to stated preferred email address.

## 2024-05-31 ENCOUNTER — Telehealth: Payer: Self-pay

## 2024-05-31 NOTE — Progress Notes (Signed)
   Telephone encounter was:  Unsuccessful.  05/31/2024 Name: SHINEKA AUBLE MRN: [id number] DOB: 1961-07-12  Unsuccessful outbound call made today to assist with:  Transportation Needs   Outreach Attempt:  1st Attempt No answer and unable to leave a message    Jon Colt [facility]  [facility] Guide, Phone: [phone] Fax: [phone] Website: [email or web address]

## 2024-05-31 NOTE — Transitions of Care (Post Inpatient/ED Visit) (Signed)
 05/31/2024  Patient ID: Virginia [name], female   DOB: [birth year], 63 y.o.   MRN: [id number]  Patient call and notified that her primary care provider will refill her Zetia  prescription per provider note/response.  Patient advised to call her pharmacy to verify Zetia  is ready for pick up.    Goals Addressed             This Visit's Progress    VBCI Transitions of Care (TOC) Care Plan       Problems:  Recent Hospitalization for treatment of Unstable angina/ NSTEMI Knowledge Deficit Related to angina/ NSTEMI, medication management and SDOH barrier: transportation  Goal:  Over the next 30 days, the patient will not experience hospital readmission  Interventions:  Notified patient that primary care provider will refill her Zetia  prescription- per provider note/ response.  Patient advised to call her pharmacy to verify Zetia  prescription is ready for pick up.   Patient Self Care Activities:  Call your pharmacy to verify Zetia  prescription is ready for pick up.   Plan:  Telephone follow up appointment with care management team member scheduled for:  06/05/24 at 2 pm         Arvin Seip RN, BSN, CCM [place]  [facility], Population Health Case Manager Phone: [phone]

## 2024-06-04 ENCOUNTER — Ambulatory Visit: Admitting: Pharmacist

## 2024-06-04 ENCOUNTER — Telehealth: Payer: Self-pay

## 2024-06-04 NOTE — Progress Notes (Deleted)
 Patient ID: Virginia [name]                 DOB: 1961-01-04                    MRN: [id number]      HPI: Virginia [name] is a 63 y.o. female patient referred to lipid clinic by  ***. PMH is significant for    Reviewed options for lowering LDL cholesterol, including ezetimibe , PCSK-9 inhibitors, bempedoic acid and inclisiran.  Discussed mechanisms of action, dosing, side effects and potential decreases in LDL cholesterol.  Also reviewed cost information and potential options for patient assistance.  Current Medications:  Intolerances:  Risk Factors:  LDL-C goal:  ApoB goal:   Diet:   Exercise:   Family History:   Social History:   Labs: Lipid Panel     Component Value Date/Time   CHOL 123 05/27/2024 0241   CHOL 169 02/15/2021 1113   TRIG 105 05/27/2024 0241   HDL 46 05/27/2024 0241   HDL 65 02/15/2021 1113   CHOLHDL 2.7 05/27/2024 0241   VLDL 21 05/27/2024 0241   LDLCALC 56 05/27/2024 0241   LDLCALC 93 02/15/2021 1113   LABVLDL 11 02/15/2021 1113    Past Medical History:  Diagnosis Date   Anemia    CAD (coronary artery disease)    a. 06/2015 BMS x2 to LCx. b. NSTEMI 11/2020 s/p DES to prox LAD; residual disease treated medically.   CKD (chronic kidney disease), stage II    Diabetes mellitus (HCC)    GERD (gastroesophageal reflux disease)    ?   GI bleeding    2016 - bleeding polyp clipped   Hyperlipidemia with target LDL less than 70    Hypertension    STEMI (ST elevation myocardial infarction) (HCC) 06/16/2015   BMS x 2 CFX   Thyroid  nodule    benign    Current Outpatient Medications on File Prior to Visit  Medication Sig Dispense Refill   acetaminophen  (TYLENOL ) 500 MG tablet Take 1 tablet (500 mg total) by mouth every 6 (six) hours as needed. 30 tablet 0   amLODipine  (NORVASC ) 5 MG tablet Take 5 mg by mouth daily.     aspirin  EC 81 MG tablet Take 1 tablet (81 mg total) by mouth daily. 30 tablet 6   empagliflozin  (JARDIANCE ) 10 MG TABS tablet Take 1  tablet (10 mg total) by mouth daily before breakfast. 90 tablet 1   ezetimibe  (ZETIA ) 10 MG tablet Take 1 tablet (10 mg total) by mouth at bedtime. 30 tablet 1   losartan  (COZAAR ) 50 MG tablet Take 50 mg by mouth daily.     metoprolol  succinate (TOPROL -XL) 25 MG 24 hr tablet Take 1 tablet (25 mg total) by mouth daily.     nitroGLYCERIN  (NITROSTAT ) 0.4 MG SL tablet Place 1 tablet (0.4 mg total) under the tongue every 5 (five) minutes as needed for chest pain. 25 tablet 3   omeprazole  (PRILOSEC) 20 MG capsule Take 1 capsule (20 mg total) by mouth daily. 90 capsule 1   rosuvastatin  (CRESTOR ) 40 MG tablet Take 40 mg by mouth daily.     ticagrelor  (BRILINTA ) 90 MG TABS tablet Take 1 tablet (90 mg total) by mouth 2 (two) times daily. 180 tablet 2   [DISCONTINUED] lisinopril  (ZESTRIL ) 2.5 MG tablet Take 1 tablet (2.5 mg total) by mouth daily. 90 tablet 1   [DISCONTINUED] metoprolol  tartrate (LOPRESSOR ) 25 MG tablet Take 0.5 tablets (12.5 mg total)  by mouth 2 (two) times daily. 180 tablet 3   No current facility-administered medications on file prior to visit.    No Known Allergies  Assessment/Plan:  1. Hyperlipidemia -  No problem-specific Assessment & Plan notes found for this encounter.    Thank you,  Claud Gowan D Nysir Fergusson, Pharm.Virginia [name], CPP [facility] [address], [place], [place] [address]  Phone: [phone]; Fax: [phone]

## 2024-06-04 NOTE — Progress Notes (Signed)
   Telephone encounter was:  Unsuccessful.  06/04/2024 Name: Virginia [name] MRN: [id number] DOB: 1961/08/14  Unsuccessful outbound call made today to assist with:  Transportation Needs   Outreach Attempt:  2nd Attempt  No answer and unable to leave a message    Jon Colt [facility]  [facility] Guide, Phone: [phone] Fax: [phone] Website: [email or web address]

## 2024-06-05 ENCOUNTER — Other Ambulatory Visit: Payer: Self-pay

## 2024-06-05 ENCOUNTER — Telehealth: Payer: Self-pay

## 2024-06-05 NOTE — Transitions of Care (Post Inpatient/ED Visit) (Signed)
 Transition of Care week 2  Visit Note  06/05/2024  Name: Virginia [name] MRN: [id number]          DOB: Aug 08, 1961  Situation: Patient enrolled in [facility] 30-day program. Visit completed with patient by telephone.   Background:   Initial Transition Care Management Follow-up Telephone Call    Past Medical History:  Diagnosis Date   Anemia    CAD (coronary artery disease)    a. 06/2015 BMS x2 to LCx. b. NSTEMI 11/2020 s/p DES to prox LAD; residual disease treated medically.   CKD (chronic kidney disease), stage II    Diabetes mellitus (HCC)    GERD (gastroesophageal reflux disease)    ?   GI bleeding    2016 - bleeding polyp clipped   Hyperlipidemia with target LDL less than 70    Hypertension    STEMI (ST elevation myocardial infarction) (HCC) 06/16/2015   BMS x 2 CFX   Thyroid  nodule    benign    Assessment: Patient Reported Symptoms: Cognitive Cognitive Status: No symptoms reported, Normal speech and language skills, Insightful and able to interpret abstract concepts, Alert and oriented to person, place, and time      Neurological Neurological Review of Symptoms: No symptoms reported    HEENT HEENT Symptoms Reported: No symptoms reported      Cardiovascular Cardiovascular Symptoms Reported: Fatigue Other Cardiovascular Symptoms: patient reports her fatigue is getting a little better. Denies any chest pain, SOB, lightheadedness or dizziness.  Patient reports obtaining her Zetia  prescription. Does patient have uncontrolled Hypertension?: No Cardiovascular Management Strategies: Routine screening, Medication therapy, Diet modification  Respiratory Respiratory Symptoms Reported: No symptoms reported    Endocrine Endocrine Symptoms Reported: No symptoms reported    Gastrointestinal Gastrointestinal Symptoms Reported: No symptoms reported      Genitourinary Genitourinary Symptoms Reported: No symptoms reported    Integumentary Integumentary Symptoms Reported: No symptoms  reported    Musculoskeletal Musculoskelatal Symptoms Reviewed: No symptoms reported        Psychosocial Psychosocial Symptoms Reported: No symptoms reported         There were no vitals filed for this visit.  Medications Reviewed Today     Reviewed by Naftoli Penny E, RN (Registered Nurse) on 06/05/24 at 1450  Med List Status: <None>   Medication Order Taking? Sig Documenting Provider Last Dose Status Informant  acetaminophen  (TYLENOL ) 500 MG tablet [id number] Yes Take 1 tablet (500 mg total) by mouth every 6 (six) hours as needed. Tilford Bertram HERO, FNP  Active Self, Pharmacy Records  amLODipine  (NORVASC ) 5 MG tablet [id number] Yes Take 5 mg by mouth daily. [provider]  Active Self, Pharmacy Records  aspirin  EC 81 MG tablet [id number] Yes Take 1 tablet (81 mg total) by mouth daily. Stroud, Natalie M, FNP  Active Self, Pharmacy Records  empagliflozin  (JARDIANCE ) 10 MG TABS tablet [id number] Yes Take 1 tablet (10 mg total) by mouth daily before breakfast. Bixler Manuelita NOVAK, NP  Active   ezetimibe  (ZETIA ) 10 MG tablet [id number] Yes Take 1 tablet (10 mg total) by mouth at bedtime. Ladona Heinz, MD  Active     Discontinued 11/26/20 1412 (Stop Taking at Discharge) losartan  (COZAAR ) 50 MG tablet [id number] Yes Take 50 mg by mouth daily. [provider]  Active Self, Pharmacy Records  metoprolol  succinate (TOPROL -XL) 25 MG 24 hr tablet [id number] Yes Take 1 tablet (25 mg total) by mouth daily. Ladona Heinz, MD  Active     Discontinued 11/26/20 1412 (Stop Taking at  Discharge) nitroGLYCERIN  (NITROSTAT ) 0.4 MG SL tablet [id number] Yes Place 1 tablet (0.4 mg total) under the tongue every 5 (five) minutes as needed for chest pain. Ladona Heinz, MD  Active   omeprazole  [facility]) 20 MG capsule [id number] Yes Take 1 capsule (20 mg total) by mouth daily. Myrna Camelia HERO, NP  Active Self, Pharmacy Records  rosuvastatin  (CRESTOR ) 40 MG tablet [id number] Yes Take 40 mg by mouth daily. [provider]  Active Self, Pharmacy Records  ticagrelor  (BRILINTA ) 90 MG TABS tablet [id number] Yes Take 1 tablet (90 mg total) by mouth 2 (two) times daily. Milone Manuelita NOVAK, NP  Active             Goals Addressed             This Visit's Progress    VBCI Transitions of Care (TOC) Care Plan       Problems:  Recent Hospitalization for treatment of Unstable angina/ NSTEMI Knowledge Deficit Related to angina/ NSTEMI, medication management and SDOH barrier: transportation  Goal:  Over the next 30 days, the patient will not experience hospital readmission  Interventions:  Transitions of Care: Provided patient contact phone number for [employer] guide and advised her to return Angela's call for transportation assistance.  Assessed for chest pain, shortness of breath, dizziness/lightheadedness Reviewed Signs and symptoms of infection related to cath site ( groin) Confirmed patient picked up Zetia  prescription  Reviewed upcoming provider visits Advised to keep follow up visits with provider.  Advised to call 911 for severe symptoms regarding SOB, chest pain, stroke- like symptoms Advised to call primary care provider for mild/ moderate symptoms/ concerns Medications reviewed and compliance discussed. Advised to take medications as prescribed.    Patient Self Care Activities:  Attend all scheduled provider appointments Call pharmacy for medication refills 3-7 days in advance of running out of medications Call provider office for new concerns or questions  Notify RN Care Manager of TOC call rescheduling needs Take medications as prescribed   Call 911 for severe symptoms such as shortness of breath, chest pain, stroke like symptoms.  Plan:  Telephone follow up appointment with care management team member scheduled for:  06/14/24 at 10 am        Recommendation:   Continue Current Plan of Care Keep scheduled primary care hospital follow up visit Keep  specialist visits Contact [place] community resource guide to discuss transportation needs.   Follow Up Plan:   Telephone follow-up in 1 week  Arvin Seip RN, BSN, CCM [place]  [facility], Population Health Case Manager Phone: [phone]

## 2024-06-05 NOTE — Patient Instructions (Signed)
 Visit Information  Thank you for taking time to visit with me today. Please don't hesitate to contact me if I can be of assistance to you before our next scheduled telephone appointment.  Our next appointment is by telephone on 06/14/24 at 10 am  Following is a copy of your care plan:   Goals Addressed             This Visit's Progress    VBCI Transitions of Care (TOC) Care Plan       Problems:  Recent Hospitalization for treatment of Unstable angina/ NSTEMI Knowledge Deficit Related to angina/ NSTEMI, medication management and SDOH barrier: transportation  Goal:  Over the next 30 days, the patient will not experience hospital readmission  Interventions:  Transitions of Care: Provided patient contact phone number for [employer] guide and advised her to return Angela's call for transportation assistance.  Assessed for chest pain, shortness of breath, dizziness/lightheadedness Reviewed Signs and symptoms of infection related to cath site ( groin) Confirmed patient picked up Zetia  prescription  Reviewed upcoming provider visits Advised to keep follow up visits with provider.  Advised to call 911 for severe symptoms regarding SOB, chest pain, stroke- like symptoms Advised to call primary care provider for mild/ moderate symptoms/ concerns Medications reviewed and compliance discussed. Advised to take medications as prescribed.    Patient Self Care Activities:  Attend all scheduled provider appointments Call pharmacy for medication refills 3-7 days in advance of running out of medications Call provider office for new concerns or questions  Notify RN Care Manager of TOC call rescheduling needs Take medications as prescribed   Call 911 for severe symptoms such as shortness of breath, chest pain, stroke like symptoms.  Plan:  Telephone follow up appointment with care management team member scheduled for:  06/14/24 at 10 am        Patient verbalizes  understanding of instructions and care plan provided today and agrees to view in MyChart. Active MyChart status and patient understanding of how to access instructions and care plan via MyChart confirmed with patient.     The patient has been provided with contact information for the care management team and has been advised to call with any health related questions or concerns.   Please call the care guide team at [phone] if you need to cancel or reschedule your appointment.   Please call the Suicide and Crisis Lifeline: 988 call 1-800-273-TALK (toll free, 24 hour hotline) if you are experiencing a Mental Health or Behavioral Health Crisis or need someone to talk to.  Arvin Seip RN, BSN, CCM [employer], Population Health Case Manager Phone: [phone]

## 2024-06-05 NOTE — Progress Notes (Signed)
   Telephone encounter was:  Unsuccessful.  06/05/2024 Name: Virginia [name] MRN: [id number] DOB: [birth year]  Unsuccessful outbound call made today to assist with:  Transportation Needs   Outreach Attempt:  3rd Attempt.  Referral closed unable to contact patient.  No answer and unable to answer the phone    [facility] Guide, Phone: [phone] Fax: [phone] Website: [email or web address]

## 2024-06-06 ENCOUNTER — Ambulatory Visit: Attending: Cardiology | Admitting: Pharmacist

## 2024-06-06 ENCOUNTER — Other Ambulatory Visit (HOSPITAL_COMMUNITY): Payer: Self-pay

## 2024-06-06 ENCOUNTER — Telehealth: Payer: Self-pay | Admitting: Pharmacy Technician

## 2024-06-06 ENCOUNTER — Ambulatory Visit: Attending: Emergency Medicine | Admitting: Emergency Medicine

## 2024-06-06 ENCOUNTER — Encounter: Payer: Self-pay | Admitting: Emergency Medicine

## 2024-06-06 VITALS — BP 124/64 | HR 88 | Ht 66.0 in | Wt 188.2 lb

## 2024-06-06 DIAGNOSIS — E785 Hyperlipidemia, unspecified: Secondary | ICD-10-CM | POA: Diagnosis present

## 2024-06-06 DIAGNOSIS — I10 Essential (primary) hypertension: Secondary | ICD-10-CM | POA: Insufficient documentation

## 2024-06-06 DIAGNOSIS — I214 Non-ST elevation (NSTEMI) myocardial infarction: Secondary | ICD-10-CM | POA: Diagnosis not present

## 2024-06-06 DIAGNOSIS — E118 Type 2 diabetes mellitus with unspecified complications: Secondary | ICD-10-CM | POA: Diagnosis present

## 2024-06-06 DIAGNOSIS — I2510 Atherosclerotic heart disease of native coronary artery without angina pectoris: Secondary | ICD-10-CM | POA: Insufficient documentation

## 2024-06-06 DIAGNOSIS — E7841 Elevated Lipoprotein(a): Secondary | ICD-10-CM

## 2024-06-06 DIAGNOSIS — K219 Gastro-esophageal reflux disease without esophagitis: Secondary | ICD-10-CM

## 2024-06-06 MED ORDER — PANTOPRAZOLE SODIUM 40 MG PO TBEC: 40.0000 mg | DELAYED_RELEASE_TABLET | Freq: Every day | ORAL | 3 refills | Status: AC

## 2024-06-06 NOTE — Assessment & Plan Note (Signed)
 Assessment: Patient currently on omeprazole  20 mg daily with complaints of reflux at night when she lies down States she was on something different while at rehab and had no reflux but is unsure what that medication was We discussed both changing to a different PPI or using famotidine in the evening No issues during the day  Plan: Stop omeprazole  and start pantoprazole  40 mg daily If this is not effective we will instead add famotidine 20 mg daily in the evening to see if this helps

## 2024-06-06 NOTE — Progress Notes (Signed)
 Cardiology Office Note:    Date:  06/06/2024  ID:  [name], DOB 1961-05-30, MRN [id number] PCP: Juanice Thomes SAUNDERS, FNP   HeartCare Providers Cardiologist:  Gordy Bergamo, MD Cardiology APP:  Rana Lum CROME, NP       Patient Profile:       Chief Complaint: Hospital follow-up History of Present Illness:  Virginia [name] is a 63 y.o. female with visit-pertinent history of type 2 diabetes, hypertension, CAD with history of angioplasty of the CX with BMS in 2016 and DES to proximal LAD in January 2022, familial hypercholesterolemia, recent mission to [facility] on 04/02/2024 with TIA involving right PCA distal PI stenosis, stroke felt to be due to small vessel disease, discharged to rehab and returned home and has essentially recuperated from most of the right upper and lower extremity weakness  She was recently admitted to the hospital on 7/22 for chest pain.  She had noted dyspnea on exertion a week prior to admission.  High sensitive troponin 27, 98.  Underwent cardiac catheterization noted to have severe three-vessel CAD.  Prior stents in the proximal LAD and mid LCx were patent.  Underwent successful PCI/DES x 1 of mid to distal LAD, DES x 1 to mid circumflex and DES x 1 to mid RCA.  Recommendations with aspirin /Brilinta  for at least 1 year.  Echocardiogram showed LVEF 55 to 60%, mild concentric LVH, normal RV   Discussed the use of AI scribe software for clinical note transcription with the patient, who gave verbal consent to proceed.  Today patient is doing well overall.  She is without acute cardiovascular concerns or complaints at this time.  She tells me that her chest pains have entirely resolved.  She still has some dyspnea on exertion however this has been slowly improving.  She notes this could be related to recent sedentary lifestyle and deconditioning.  She is without any orthopnea, PND, leg swelling, palpitations, syncope.  She is interested in cardiac  rehab as she used to walk frequently in the park and would like to be more active.  She is working on weight loss and reports she struggled with weight gain since menopause.  No lightheadedness, dizziness, hematochezia, melena.  Review of systems:  Please see the history of present illness. All other systems are reviewed and otherwise negative.      Studies Reviewed:    EKG Interpretation Date/Time:  Thursday June 06 2024 14:41:46 EDT Ventricular Rate:  79 PR Interval:  170 QRS Duration:  76 QT Interval:  364 QTC Calculation: 417 R Axis:   53  Text Interpretation: Normal sinus rhythm Nonspecific T wave abnormality When compared with ECG of 28-May-2024 06:43, Nonspecific T wave abnormality no longer evident in Inferior leads Nonspecific T wave abnormality has replaced inverted T waves in Anterolateral leads Confirmed by Rana Lum [phone]) on 06/06/2024 9:18:25 PM    Echocardiogram 05/28/2024  1. Left ventricular ejection fraction, by estimation, is 55 to 60%. The  left ventricle has normal function. The left ventricle has no regional  wall motion abnormalities. There is mild concentric left ventricular  hypertrophy. Left ventricular diastolic  parameters were normal.   2. Right ventricular systolic function is normal. The right ventricular  size is normal.   3. The mitral valve is normal in structure. Trivial mitral valve  regurgitation. No evidence of mitral stenosis.   4. The aortic valve is tricuspid. Aortic valve regurgitation is not  visualized. No aortic stenosis is present.  5. The inferior vena cava is normal in size with greater than 50%  res her piratory variability, suggesting right atrial pressure of 3 mmHg.   Cardiac catheterization 05/27/2024   Mid LAD-1 lesion is 99% stenosed.   Mid LAD-2 lesion is 80% stenosed.   Prox RCA lesion is 90% stenosed.   Prox RCA to Mid RCA lesion is 80% stenosed.   RPDA lesion is 50% stenosed.   Prox Cx to Mid Cx lesion is 80%  stenosed.   Previously placed Prox LAD to Mid LAD stent of unknown type is  widely patent.   Previously placed Mid Cx to Dist Cx stent of unknown type is  widely patent.   A drug-eluting stent was successfully placed using a STENT SYNERGY XD 2.50X38.   A drug-eluting stent was successfully placed using a STENT SYNERGY XD 3.0X12.   A drug-eluting stent was successfully placed using a STENT SYNERGY XD 2.50X24.   Post intervention, there is a 0% residual stenosis.   Post intervention, there is a 0% residual stenosis.   Post intervention, there is a 0% residual stenosis.   Post intervention, there is a 0% residual stenosis.   Post intervention, there is a 0% residual stenosis.   Post intervention, there is a 0% residual stenosis.   Post intervention, there is a 0% residual stenosis.   LV end diastolic pressure is normal.   Recommend uninterrupted dual antiplatelet therapy with Aspirin  81mg  daily and Ticagrelor  90mg  twice daily for a minimum of 12 months (ACS-Class I recommendation).   Severe 3 vessel obstructive CAD. Prior stents in the proximal LAD and mid LCx were patent. New disease in the mid to distal LAD, mid LCx and mid RCA Normal LVEDP Successful PCI of the mid to distal LAD with DES Successful PCI of the mid LCx with DES Successful PCI of the mid RCA with DES Diagnostic Dominance: Right  Intervention   Risk Assessment/Calculations:              Physical Exam:   VS:  BP 124/64 (BP Location: Left Arm, Patient Position: Sitting, Cuff Size: Normal)   Pulse 88   Ht 5' 6 (1.676 m)   Wt 188 lb 3.2 oz (85.4 kg)   LMP 12/29/2015   SpO2 98%   BMI 30.38 kg/m    Wt Readings from Last 3 Encounters:  06/06/24 188 lb 3.2 oz (85.4 kg)  05/28/24 185 lb 3.2 oz (84 kg)  02/08/23 168 lb 6.9 oz (76.4 kg)    GEN: Well nourished, well developed in no acute distress NECK: No JVD; No carotid bruits CARDIAC: RRR, no murmurs, rubs, gallops RESPIRATORY:  Clear to auscultation without  rales, wheezing or rhonchi  ABDOMEN: Soft, non-tender, non-distended EXTREMITIES:  No edema; No acute deformity      Assessment and Plan:  NSTEMI Coronary artery disease S/p BMS to LCx in 2016 and DES to proximal LAD in 2022 Admitted 05/2024 with NSTEMI. LHC 05/2024 showed severe three-vessel CAD.   Underwent successful PCI/DES x 1 to mid-distal LAD, DES x 1 to mid LCx, and DES x 1 to mid RCA Echocardiogram 05/2024 with LVEF 55 to 60%, no RWMA EKG today is without acute ischemic changes - Today patient reports chest pains have resolved.  Dyspnea has slowly been improving.  No further anginal symptoms.  No indication for further ischemic evaluation at this time - Cath site healing appropriately without hematoma, pain, or swelling - Continue DAPT with aspirin  81 mg and Brilinta  90 mg  twice daily for at least 1 year - Continue rosuvastatin  40 mg daily, ezetimibe  10 mg daily, losartan  50 mg daily, amlodipine  5 mg daily, metoprolol  XL 25 mg daily, nitroglycerin  as needed, and Repatha - Cleared for cardiac rehab - BMET and CBC today  Hyperlipidemia, LDL goal <55 Lipoprotein (A) 298.8 LDL 56 on 05/2024 - Recently approved for Repatha.  Continue rosuvastatin  40 mg daily and ezetimibe  10 mg daily.  Can discontinue Zetia  once she begins Repatha - Repeat lipid panel/LFTs at follow-up visit  Hypertension Blood pressure today is 124/64 and well-controlled - Continue current antihypertensive regimen  Type 2 diabetes A1c 6.6 on 05/2024 - Patient to follow-up with PCP next week    Cardiac Rehabilitation Eligibility Assessment  The patient is ready to start cardiac rehabilitation from a cardiac standpoint.     Dispo:  Return in about 3 months (around 09/06/2024).  Signed, Lum LITTIE Louis, NP

## 2024-06-06 NOTE — Progress Notes (Signed)
 Patient ID: Virginia [name]                 DOB: [birth year]                    MRN: [id number]      HPI: Virginia [name] is a 63 y.o. female patient referred to lipid clinic by Dr. Ladona. PMH is significant for DM, hypertension, CAD with history of angioplasty to CX with BMS in 2016 and DES to proximal LAD in January 2022 familial hypercholesterolemia. Recent admission to [facility] on 04/02/2024 with TIA involving right PCA distal P1 stenosis, stroke felt to be due to small vessel disease. She was admitted to [facility] 05/26/24 with NSTEMI. Underwent cardiac catheterization noted above with severe three-vessel CAD.  Underwent successful PCI/DES x1 m/dLAD, DES x1 to mid circumflex and DES x1 to mid RCA.    Patient presents today to lipid clinic.  She has been taking her rosuvastatin  and ezetimibe .  Since her non-STEMI she has stopped drinking any sweetened beverages.  She states that she is trying to decrease her fried food intake.  She has been getting back into exercise using her desk cycle for both legs and arms and walking some stairs.  Looking forward to cardiac rehab.  Previously patient had refused PCKS9i, now reconsidering.  We reviewed side effects, cost, injection technique and efficacy.  Reviewed her LP(a) level.   Current Medications: rosuvastatin  40mg  daily, ezetimibe  10mg  daily  Intolerances:  Risk Factors: progressive ASCVD, TIA, DM, elevated LP(a) LDL-C goal: <55 ApoB goal: <70  Diet:  Breakfast: oatmeal, honeydo and grapes Lunch:  Drinks sweet tea, lemonade- stopped drinking after hospital  Exercise: getting back into exercise, wants to do cardiac rehab- desk cycle  Family History:  Family History  Problem Relation Age of Onset   Stroke Mother    Heart disease Mother    Diabetes Mother    Cancer Father    Hypertension Sister    Hyperlipidemia Sister    Heart attack Brother    Hypertension Brother    Heart attack Maternal Uncle    Asthma Paternal Uncle     Diabetes Paternal Uncle    Diabetes Paternal Aunt    Thyroid  disease Neg Hx     Social History: no tobacco, to ETOH  Labs: Lipid Panel  Component Ref Range & Units 2 mo ago  CHOLESTEROL TOTAL 100 - 199 mg/dL 804  Trig 0 - 850 mg/dL 98  LDL 0 - 99 mg/dL 875 High   VLDL 5 - 40 mg/dl 20  CHOL/HDL 0 - 5 4  HDL >=39 mg/dL 51  Resulting Agency [facility]   Specimen Collected: 04/03/24 10:55   Performed by: [facility] Last Resulted: 04/03/24 11:53    Past Medical History:  Diagnosis Date   Anemia    CAD (coronary artery disease)    a. 06/2015 BMS x2 to LCx. b. NSTEMI 11/2020 s/p DES to prox LAD; residual disease treated medically.   CKD (chronic kidney disease), stage II    Diabetes mellitus (HCC)    GERD (gastroesophageal reflux disease)    ?   GI bleeding    2016 - bleeding polyp clipped   Hyperlipidemia with target LDL less than 70    Hypertension    STEMI (ST elevation myocardial infarction) (HCC) 06/16/2015   BMS x 2 CFX   Thyroid  nodule    benign    Current Outpatient Medications on File Prior to  Visit  Medication Sig Dispense Refill   amLODipine  (NORVASC ) 5 MG tablet Take 5 mg by mouth daily.     aspirin  EC 81 MG tablet Take 1 tablet (81 mg total) by mouth daily. 30 tablet 6   empagliflozin  (JARDIANCE ) 10 MG TABS tablet Take 1 tablet (10 mg total) by mouth daily before breakfast. 90 tablet 1   ezetimibe  (ZETIA ) 10 MG tablet Take 1 tablet (10 mg total) by mouth at bedtime. 30 tablet 1   losartan  (COZAAR ) 50 MG tablet Take 50 mg by mouth daily.     metoprolol  succinate (TOPROL -XL) 25 MG 24 hr tablet Take 1 tablet (25 mg total) by mouth daily. (Patient taking differently: Take 12.5 mg by mouth daily.)     nitroGLYCERIN  (NITROSTAT ) 0.4 MG SL tablet Place 1 tablet (0.4 mg total) under the tongue every 5 (five) minutes as needed for chest pain. 25 tablet 3   rosuvastatin  (CRESTOR ) 40 MG tablet Take 40 mg by mouth daily.     ticagrelor   (BRILINTA ) 90 MG TABS tablet Take 1 tablet (90 mg total) by mouth 2 (two) times daily. 180 tablet 2   acetaminophen  (TYLENOL ) 500 MG tablet Take 1 tablet (500 mg total) by mouth every 6 (six) hours as needed. 30 tablet 0   [DISCONTINUED] lisinopril  (ZESTRIL ) 2.5 MG tablet Take 1 tablet (2.5 mg total) by mouth daily. 90 tablet 1   [DISCONTINUED] metoprolol  tartrate (LOPRESSOR ) 25 MG tablet Take 0.5 tablets (12.5 mg total) by mouth 2 (two) times daily. 180 tablet 3   No current facility-administered medications on file prior to visit.    No Known Allergies  Assessment/Plan:  1. Hyperlipidemia -  Hyperlipidemia Assessment: LDL-C is above goal of less than 55 on rosuvastatin  40 mg daily and ezetimibe  Patient willing to try PCSK9  We reviewed side effects, cost, injection technique and efficacy.  Reviewed her LP(a) level.  Patient making positive changes to her diet including stopping sweetened beverages-has lost 5 pounds since She is has begun to exercise again and is looking forward to cardiac rehab  Plan: Submit prior authorization for Repatha Continue rosuvastatin  40 mg daily and ezetimibe  10 mg daily.  Can stop ezetimibe  when she starts Repatha Labs in 3 months  Gastroesophageal reflux disease without esophagitis Assessment: Patient currently on omeprazole  20 mg daily with complaints of reflux at night when she lies down States she was on something different while at rehab and had no reflux but is unsure what that medication was We discussed both changing to a different PPI or using famotidine in the evening No issues during the day  Plan: Stop omeprazole  and start pantoprazole  40 mg daily If this is not effective we will instead add famotidine 20 mg daily in the evening to see if this helps    Thank you,  Yilin Weedon D Noami Bove, Pharm.JONETTA SARAN, CPP [facility] [address], [place], [place] [address]  Phone: [phone];  Fax: [phone]

## 2024-06-06 NOTE — Patient Instructions (Addendum)
 I will submit a prior authorization for Repatha. I will call you once I hear back. Please call me at [phone] with any questions.   Repatha is a cholesterol medication that improved your body's ability to get rid of bad cholesterol known as LDL. It can lower your LDL up to 60%! It is an injection that is given under the skin every 2 weeks. The medication often requires a prior authorization from your insurance company. We will take care of submitting all the necessary information to your insurance company to get it approved. The most common side effects of Repatha include runny nose, symptoms of the common cold, rarely flu or flu-like symptoms, back/muscle pain in about 3-4% of the patients, and redness, pain, or bruising at the injection site. Tell your healthcare provider if you have any side effect that bothers you or that does not go away.   Stop omeprazole  and start pantoprazole   Continue rosuvastatin  40mg  daily and ezetimibe 

## 2024-06-06 NOTE — Telephone Encounter (Signed)
 Pharmacy Patient Advocate Encounter   Received notification from Physician's Office that prior authorization for Repatha is required/requested.   Insurance verification completed.   The patient is insured through [employer] .   Per test claim: PA required; PA submitted to above mentioned insurance via CoverMyMeds Key/confirmation #/EOC  [id number] Status is pending

## 2024-06-06 NOTE — Patient Instructions (Signed)
 Medication Instructions:  NO CHANGES   Lab Work: BMET AND CBC TO BE DONE TODAY.   Testing/Procedures: NONE  Follow-Up: At [facility], you and your health needs are our priority.  As part of our continuing mission to provide you with exceptional heart care, our providers are all part of one team.  This team includes your primary Cardiologist (physician) and Advanced Practice Providers or APPs (Physician Assistants and Nurse Practitioners) who all work together to provide you with the care you need, when you need it.  Your next appointment:   3 MONTHS  Provider:   Gordy Bergamo, MD    We recommend signing up for the patient portal called MyChart.  Sign up information is provided on this After Visit Summary.  MyChart is used to connect with patients for Virtual Visits (Telemedicine).  Patients are able to view lab/test results, encounter notes, upcoming appointments, etc.  Non-urgent messages can be sent to your provider as well.   To learn more about what you can do with MyChart, go to [email or web address].

## 2024-06-06 NOTE — Assessment & Plan Note (Signed)
 Assessment: LDL-C is above goal of less than 55 on rosuvastatin  40 mg daily and ezetimibe  Patient willing to try PCSK9  We reviewed side effects, cost, injection technique and efficacy.  Reviewed her LP(a) level.  Patient making positive changes to her diet including stopping sweetened beverages-has lost 5 pounds since She is has begun to exercise again and is looking forward to cardiac rehab  Plan: Submit prior authorization for Repatha Continue rosuvastatin  40 mg daily and ezetimibe  10 mg daily.  Can stop ezetimibe  when she starts Repatha Labs in 3 months

## 2024-06-07 ENCOUNTER — Other Ambulatory Visit (HOSPITAL_COMMUNITY): Payer: Self-pay

## 2024-06-07 ENCOUNTER — Encounter: Payer: Self-pay | Admitting: Nurse Practitioner

## 2024-06-07 ENCOUNTER — Ambulatory Visit: Payer: Self-pay | Admitting: Nurse Practitioner

## 2024-06-07 VITALS — BP 137/68 | Wt 187.0 lb

## 2024-06-07 DIAGNOSIS — I214 Non-ST elevation (NSTEMI) myocardial infarction: Secondary | ICD-10-CM

## 2024-06-07 DIAGNOSIS — E119 Type 2 diabetes mellitus without complications: Secondary | ICD-10-CM

## 2024-06-07 DIAGNOSIS — E785 Hyperlipidemia, unspecified: Secondary | ICD-10-CM

## 2024-06-07 DIAGNOSIS — S025XXA Fracture of tooth (traumatic), initial encounter for closed fracture: Secondary | ICD-10-CM | POA: Diagnosis not present

## 2024-06-07 DIAGNOSIS — G459 Transient cerebral ischemic attack, unspecified: Secondary | ICD-10-CM

## 2024-06-07 DIAGNOSIS — Z1231 Encounter for screening mammogram for malignant neoplasm of breast: Secondary | ICD-10-CM

## 2024-06-07 DIAGNOSIS — I10 Essential (primary) hypertension: Secondary | ICD-10-CM

## 2024-06-07 DIAGNOSIS — I2510 Atherosclerotic heart disease of native coronary artery without angina pectoris: Secondary | ICD-10-CM

## 2024-06-07 LAB — CBC
Hematocrit: 38.7 % (ref 34.0–46.6)
Hemoglobin: 12.5 g/dL (ref 11.1–15.9)
MCH: 29.7 pg (ref 26.6–33.0)
MCHC: 32.3 g/dL (ref 31.5–35.7)
MCV: 92 fL (ref 79–97)
Platelets: 212 x10E3/uL (ref 150–450)
RBC: 4.21 x10E6/uL (ref 3.77–5.28)
RDW: 14.4 % (ref 11.7–15.4)
WBC: 6.8 x10E3/uL (ref 3.4–10.8)

## 2024-06-07 LAB — BASIC METABOLIC PANEL WITH GFR
BUN/Creatinine Ratio: 10 — AB (ref 12–28)
BUN: 12 mg/dL (ref 8–27)
CO2: 20 mmol/L (ref 20–29)
Calcium: 9.3 mg/dL (ref 8.7–10.3)
Chloride: 106 mmol/L (ref 96–106)
Creatinine, Ser: 1.16 mg/dL — AB (ref 0.57–1.00)
Glucose: 99 mg/dL (ref 70–99)
Potassium: 3.9 mmol/L (ref 3.5–5.2)
Sodium: 141 mmol/L (ref 134–144)
eGFR: 53 mL/min/1.73 — AB (ref >59)

## 2024-06-07 MED ORDER — LOSARTAN POTASSIUM 50 MG PO TABS
50.0000 mg | ORAL_TABLET | Freq: Every day | ORAL | 1 refills | Status: AC
  Filled 2024-06-07: qty 90, 90d supply, fill #0

## 2024-06-07 MED ORDER — ROSUVASTATIN CALCIUM 40 MG PO TABS
40.0000 mg | ORAL_TABLET | Freq: Every day | ORAL | 1 refills | Status: AC
  Filled 2024-06-07: qty 90, 90d supply, fill #0

## 2024-06-07 MED ORDER — AMLODIPINE BESYLATE 5 MG PO TABS
5.0000 mg | ORAL_TABLET | Freq: Every day | ORAL | 1 refills | Status: AC
  Filled 2024-06-07: qty 90, 90d supply, fill #0

## 2024-06-07 NOTE — Assessment & Plan Note (Addendum)
 Taking metoprolol  12.5 mg daily instead of 25 mg daily, losartan  50mg  daily, amlodipine  5mg  daily Encouraged to monitor blood pressure at home and  take metoprolol  25 mg daily if blood pressure readings is consistently greater than 130/80 DASH diet and commitment to daily physical activity for a minimum of 30 minutes discussed and encouraged, as a part of hypertension management.      06/07/2024    2:36 PM 06/07/2024    2:26 PM 06/06/2024    2:05 PM 05/28/2024   10:50 AM 05/28/2024    7:23 AM 05/28/2024    7:18 AM 05/28/2024    3:46 AM  BP/Weight  Systolic BP 137 137 124 136 94 88 136  Diastolic BP 68 65 64 69 81 74 63  Wt. (Lbs)  187 188.2    185.2  BMI  30.18 kg/m2 30.38 kg/m2    29.89 kg/m2

## 2024-06-07 NOTE — Assessment & Plan Note (Signed)
 Has a broken teeth would like referral to the dentist Referral placed

## 2024-06-07 NOTE — Assessment & Plan Note (Signed)
 LDL goal is less than 55 Continue Zetia  10 mg daily, Crestor  40 mg daily, will be starting Repatha injectionn Lab Results  Component Value Date   CHOL 123 05/27/2024   HDL 46 05/27/2024   LDLCALC 56 05/27/2024   TRIG 105 05/27/2024   CHOLHDL 2.7 05/27/2024

## 2024-06-07 NOTE — Patient Instructions (Addendum)
 Goal for fasting blood sugar ranges from 80 to 120 and 2 hours after any meal or at bedtime should be between 130 to 170.   Please consider getting Shingrix ,pneumococcal vaccine Tdap vaccine at local pharmacy.    Please call [phone]   to schedule your mammogram.  [facility]. [address] [place], [place] [address]. [place] .     It is important that you exercise regularly at least 30 minutes 5 times a week as tolerated  Think about what you will eat, plan ahead. Choose  clean, green, fresh or frozen over canned, processed or packaged foods which are more sugary, salty and fatty. 70 to 75% of food eaten should be vegetables and fruit. Three meals at set times with snacks allowed between meals, but they must be fruit or vegetables. Aim to eat over a 12 hour period , example 7 am to 7 pm, and STOP after  your last meal of the day. Drink water,generally about 64 ounces per day, no other drink is as healthy. Fruit juice is best enjoyed in a healthy way, by EATING the fruit.  Thanks for choosing [facility] we consider it a privelige to serve you.

## 2024-06-07 NOTE — Telephone Encounter (Signed)
 Pharmacy Patient Advocate Encounter  Received notification from [facility] that Prior Authorization for Repatha has been APPROVED from 06/06/24 to 06/06/25. Ran test claim, Copay is $4.00- one month. This test claim was processed through [facility]- copay amounts may vary at other pharmacies due to pharmacy/plan contracts, or as the patient moves through the different stages of their insurance plan.   PA #/Case ID/Reference #: [id number]

## 2024-06-07 NOTE — Assessment & Plan Note (Signed)
 Continue Crestor  40 mg daily, aspirin  81 mg daily, Brilinta  90 mg twice daily

## 2024-06-07 NOTE — Assessment & Plan Note (Addendum)
 Lab Results  Component Value Date   HGBA1C 6.6 (H) 05/26/2024  Continue Jardiance  10 mg daily Patient counseled on low-carb diet Checking urine microalbumin labs, referred for diabetic eye exam placed Diabetic foot exam at next visit

## 2024-06-07 NOTE — Assessment & Plan Note (Signed)
 Continue aspirin  81 mg daily, Brilinta  80 mg twice daily, metoprolol  12.5 mg daily, Zetia  10 mg daily, rosuvastatin  40 mg daily Maintain close follow-up with cardiology

## 2024-06-07 NOTE — Progress Notes (Signed)
 New Patient Office Visit  Subjective:  Patient ID: Virginia [name], female    DOB: 07/11/1961  Age: 63 y.o. MRN: [id number]  CC:  Chief Complaint  Patient presents with   Hospitalization Follow-up    HPI [name] is a 63 y.o. female  has a past medical history of Anemia, CAD (coronary artery disease), CKD (chronic kidney disease), stage II, Diabetes mellitus (HCC), GERD (gastroesophageal reflux disease), GI bleeding, Hyperlipidemia with target LDL less than 70, Hypertension, STEMI (ST elevation myocardial infarction) (HCC) (06/16/2015), and Thyroid  nodule.  Patient presents establish care for her chronic medical conditions.  [facility]   NSTEMI patient was on admission from 05/26/2024 to 05/28/2024, underwent successful PCI started on aspirin  81 mg daily, Brilinta  90 mg twice daily for at least 1 year.  Was seen by cardiology yesterday.  She currently denies chest pain  Hypertension.  Currently on metoprolol  12.5 mg daily, amlodipine  5 mg daily, losartan  50 mg daily.  She currently denies chest pain, stated that her shortness of breath is better  Hyperlipidemia on Crestor  40 mg daily, Zetia  10 mg daily, cardiology is working on starting Repatha injection  Type 2 diabetes.  Recently started on Jardiance  10 mg daily  History of TIA .  Established with neurology at [facility], she was on admission in May for possible TIA    Lab Results  Component Value Date   HGBA1C 6.6 (H) 05/26/2024    Past Medical History:  Diagnosis Date   Anemia    CAD (coronary artery disease)    a. 06/2015 BMS x2 to LCx. b. NSTEMI 11/2020 s/p DES to prox LAD; residual disease treated medically.   CKD (chronic kidney disease), stage II    Diabetes mellitus (HCC)    GERD (gastroesophageal reflux disease)    ?   GI bleeding    2016 - bleeding polyp clipped   Hyperlipidemia with target LDL less than 70    Hypertension    STEMI (ST elevation  myocardial infarction) (HCC) 06/16/2015   BMS x 2 CFX   Thyroid  nodule    benign    Past Surgical History:  Procedure Laterality Date   ABDOMINAL HYSTERECTOMY N/A 02/02/2016   Procedure: HYSTERECTOMY ABDOMINAL WITH BILATERAL SALPINGECTOMY;  Surgeon: Lynwood KANDICE Solomons, MD;  Location: [facility] OR[facility];  Service: Gynecology;  Laterality: N/A;   APPENDECTOMY     CARDIAC CATHETERIZATION N/A 06/17/2015   Procedure: Left Heart Cath and Coronary Angiography;  Surgeon: Candyce GORMAN Reek, MD;  Location: [facility] INVASIVE CV LAB;  Service: Cardiovascular;  Laterality: N/A;   CARDIAC CATHETERIZATION  06/17/2015   Procedure: Coronary Stent Intervention;  Surgeon: Candyce GORMAN Reek, MD;  Location: [facility] INVASIVE CV LAB;  Service: Cardiovascular;;   CARDIAC CATHETERIZATION N/A 09/18/2015   Procedure: Left Heart Cath and Coronary Angiography;  Surgeon: Debby DELENA Sor, MD;  Location: [facility] INVASIVE CV LAB;  Service: Cardiovascular;  Laterality: N/A;   CESAREAN SECTION     Myomectomy done during cesarean section   COLONOSCOPY N/A 09/20/2015   Procedure: COLONOSCOPY;  Surgeon: Belvie Just, MD;  Location: [facility] ENDOSCOPY;  Service: Endoscopy;  Laterality: N/A;   COLONOSCOPY     CORONARY STENT INTERVENTION N/A 11/25/2020   Procedure: CORONARY STENT INTERVENTION;  Surgeon: Wonda Sharper, MD;  Location: [facility] INVASIVE CV LAB;  Service: Cardiovascular;  Laterality: N/A;   CORONARY STENT INTERVENTION N/A 05/27/2024   Procedure: CORONARY STENT INTERVENTION;  Surgeon: Swaziland, Peter M, MD;  Location: [facility] INVASIVE CV LAB;  Service: Cardiovascular;  Laterality: N/A;   CORONARY STENT PLACEMENT  11/25/2020   ESOPHAGOGASTRODUODENOSCOPY N/A 09/20/2015   Procedure: ESOPHAGOGASTRODUODENOSCOPY (EGD);  Surgeon: Belvie Just, MD;  Location: [facility] ENDOSCOPY;  Service: Endoscopy;  Laterality: N/A;   LEFT HEART CATH AND CORONARY ANGIOGRAPHY N/A 11/25/2020   Procedure: LEFT HEART CATH AND CORONARY ANGIOGRAPHY;  Surgeon: Wonda Sharper, MD;  Location: [facility]  INVASIVE CV LAB;  Service: Cardiovascular;  Laterality: N/A;   LEFT HEART CATH AND CORONARY ANGIOGRAPHY N/A 05/27/2024   Procedure: LEFT HEART CATH AND CORONARY ANGIOGRAPHY;  Surgeon: Swaziland, Peter M, MD;  Location: [facility] INVASIVE CV LAB;  Service: Cardiovascular;  Laterality: N/A;   olympus  quick clip pro gastric body  10/09/15    Family History  Problem Relation Age of Onset   Stroke Mother    Heart disease Mother    Diabetes Mother    Cancer Father    Hypertension Sister    Hyperlipidemia Sister    Heart attack Brother    Hypertension Brother    Heart attack Maternal Uncle    Asthma Paternal Uncle    Diabetes Paternal Uncle    Diabetes Paternal Aunt    Thyroid  disease Neg Hx     Social History   Socioeconomic History   Marital status: Married    Spouse name: Not on file   Number of children: 2   Years of education: Not on file   Highest education level: Not on file  Occupational History   Not on file  Tobacco Use   Smoking status: Never   Smokeless tobacco: Never  Vaping Use   Vaping status: Never Used  Substance and Sexual Activity   Alcohol use: No    Alcohol/week: 0.0 standard drinks of alcohol   Drug use: No   Sexual activity: Not Currently    Birth control/protection: Post-menopausal  Other Topics Concern   Not on file  Social History Narrative   Lives with her husband    Social Drivers of Health   Financial Resource Strain: Low Risk  (01/30/2024)   Received from [facility]   Overall Financial Resource Strain (CARDIA)    Difficulty of Paying Living Expenses: Not hard at all  Food Insecurity: No Food Insecurity (05/29/2024)   Hunger Vital Sign    Worried About Running Out of Food in the Last Year: Never true    Ran Out of Food in the Last Year: Never true  Transportation Needs: No Transportation Needs (05/29/2024)   PRAPARE - Administrator, Civil Service (Medical): No    Lack of Transportation (Non-Medical): No  Physical Activity: Not on  file  Stress: No Stress Concern Present (04/04/2024)   Received from [facility] - Occupational Stress Questionnaire    Feeling of Stress : Not at all  Social Connections: Unknown (03/03/2023)   Received from [facility]   Social Network    Social Network: Not on file  Intimate Partner Violence: Not At Risk (05/29/2024)   Humiliation, Afraid, Rape, and Kick questionnaire    Fear of Current or Ex-Partner: No    Emotionally Abused: No    Physically Abused: No    Sexually Abused: No    ROS Review of Systems  Constitutional:  Negative for appetite change, chills, fatigue and fever.  HENT:  Negative for congestion, postnasal drip, rhinorrhea and sneezing.   Respiratory:  Negative for cough, shortness of breath and wheezing.  Cardiovascular:  Negative for chest pain, palpitations and leg swelling.  Gastrointestinal:  Negative for abdominal pain, constipation, nausea and vomiting.  Genitourinary:  Negative for difficulty urinating, dysuria, flank pain and frequency.  Musculoskeletal:  Negative for arthralgias, back pain, joint swelling and myalgias.  Skin:  Negative for color change, pallor, rash and wound.  Neurological:  Negative for dizziness, facial asymmetry, weakness, numbness and headaches.  Psychiatric/Behavioral:  Negative for behavioral problems, confusion, self-injury and suicidal ideas.     Objective:   Today's Vitals: BP 137/68   Wt 187 lb (84.8 kg)   LMP 12/29/2015   BMI 30.18 kg/m   Physical Exam Vitals and nursing note reviewed.  Constitutional:      General: She is not in acute distress.    Appearance: Normal appearance. She is obese. She is not ill-appearing, toxic-appearing or diaphoretic.  Eyes:     General: No scleral icterus.       Right eye: No discharge.        Left eye: No discharge.     Extraocular Movements: Extraocular movements intact.     Conjunctiva/sclera: Conjunctivae normal.  Cardiovascular:      Rate and Rhythm: Normal rate and regular rhythm.     Pulses: Normal pulses.     Heart sounds: Normal heart sounds. No murmur heard.    No friction rub. No gallop.  Pulmonary:     Effort: Pulmonary effort is normal. No respiratory distress.     Breath sounds: Normal breath sounds. No stridor. No wheezing, rhonchi or rales.  Chest:     Chest wall: No tenderness.  Abdominal:     General: There is no distension.     Palpations: Abdomen is soft.     Tenderness: There is no abdominal tenderness. There is no right CVA tenderness, left CVA tenderness or guarding.  Musculoskeletal:        General: No swelling, tenderness, deformity or signs of injury.     Right lower leg: No edema.     Left lower leg: No edema.  Skin:    General: Skin is warm and dry.     Capillary Refill: Capillary refill takes less than 2 seconds.     Coloration: Skin is not jaundiced or pale.     Findings: No bruising, erythema or lesion.  Neurological:     Mental Status: She is alert and oriented to person, place, and time.     Motor: No weakness.     Coordination: Coordination normal.     Gait: Gait normal.  Psychiatric:        Mood and Affect: Mood normal.        Behavior: Behavior normal.        Thought Content: Thought content normal.        Judgment: Judgment normal.     Assessment & Plan:   Problem List Items Addressed This Visit       Cardiovascular and Mediastinum   Essential hypertension   Taking metoprolol  12.5 mg daily instead of 25 mg daily, losartan  50mg  daily, amlodipine  5mg  daily Encouraged to monitor blood pressure at home and  take metoprolol  25 mg daily if blood pressure readings is consistently greater than 130/80 DASH diet and commitment to daily physical activity for a minimum of 30 minutes discussed and encouraged, as a part of hypertension management.      06/07/2024    2:36 PM 06/07/2024    2:26 PM 06/06/2024    2:05 PM 05/28/2024   10:50  AM 05/28/2024    7:23 AM 05/28/2024    7:18 AM  05/28/2024    3:46 AM  BP/Weight  Systolic BP 137 137 124 136 94 88 136  Diastolic BP 68 65 64 69 81 74 63  Wt. (Lbs)  187 188.2    185.2  BMI  30.18 kg/m2 30.38 kg/m2    29.89 kg/m2           Relevant Medications   amLODipine  (NORVASC ) 5 MG tablet   losartan  (COZAAR ) 50 MG tablet   rosuvastatin  (CRESTOR ) 40 MG tablet   CAD (coronary artery disease)   Continue aspirin  81 mg daily, Brilinta  80 mg twice daily, metoprolol  12.5 mg daily, Zetia  10 mg daily, rosuvastatin  40 mg daily Maintain close follow-up with cardiology      Relevant Medications   amLODipine  (NORVASC ) 5 MG tablet   losartan  (COZAAR ) 50 MG tablet   rosuvastatin  (CRESTOR ) 40 MG tablet   NSTEMI (non-ST elevated myocardial infarction) (HCC) - Primary   Continue aspirin  81 mg daily, Brilinta  90 mg twice daily metoprolol  12.5 mg daily, rosuvastatin  40 mg daily, Zetia  10 mg daily Maintain close follow-up with cardiology      Relevant Medications   amLODipine  (NORVASC ) 5 MG tablet   losartan  (COZAAR ) 50 MG tablet   rosuvastatin  (CRESTOR ) 40 MG tablet   TIA (transient ischemic attack)   Continue Crestor  40 mg daily, aspirin  81 mg daily, Brilinta  90 mg twice daily       Relevant Medications   amLODipine  (NORVASC ) 5 MG tablet   losartan  (COZAAR ) 50 MG tablet   rosuvastatin  (CRESTOR ) 40 MG tablet     Digestive   Broken teeth   Has a broken teeth would like referral to the dentist Referral placed      Relevant Orders   Ambulatory referral to Dentistry     Endocrine   Controlled type 2 diabetes mellitus without complication, without long-term current use of insulin  (HCC)   Lab Results  Component Value Date   HGBA1C 6.6 (H) 05/26/2024  Continue Jardiance  10 mg daily Patient counseled on low-carb diet Checking urine microalbumin labs, referred for diabetic eye exam placed Diabetic foot exam at next visit      Relevant Medications   losartan  (COZAAR ) 50 MG tablet   rosuvastatin  (CRESTOR ) 40 MG tablet    Other Relevant Orders   Microalbumin/Creatinine Ratio, Urine   Ambulatory referral to Ophthalmology     Other   Dyslipidemia   LDL goal is less than 55 Continue Zetia  10 mg daily, Crestor  40 mg daily, will be starting Repatha injectionn Lab Results  Component Value Date   CHOL 123 05/27/2024   HDL 46 05/27/2024   LDLCALC 56 05/27/2024   TRIG 105 05/27/2024   CHOLHDL 2.7 05/27/2024         Relevant Medications   rosuvastatin  (CRESTOR ) 40 MG tablet   Other Visit Diagnoses       Screening mammogram for breast cancer       Relevant Orders   MM 3D SCREENING MAMMOGRAM BILATERAL BREAST       Outpatient Encounter Medications as of 06/07/2024  Medication Sig   acetaminophen  (TYLENOL ) 500 MG tablet Take 1 tablet (500 mg total) by mouth every 6 (six) hours as needed.   aspirin  EC 81 MG tablet Take 1 tablet (81 mg total) by mouth daily.   empagliflozin  (JARDIANCE ) 10 MG TABS tablet Take 1 tablet (10 mg total) by mouth daily before breakfast.   ezetimibe  (ZETIA ) 10  MG tablet Take 1 tablet (10 mg total) by mouth at bedtime.   metoprolol  succinate (TOPROL -XL) 25 MG 24 hr tablet Take 1 tablet (25 mg total) by mouth daily.   nitroGLYCERIN  (NITROSTAT ) 0.4 MG SL tablet Place 1 tablet (0.4 mg total) under the tongue every 5 (five) minutes as needed for chest pain.   pantoprazole  (PROTONIX ) 40 MG tablet Take 1 tablet (40 mg total) by mouth daily.   ticagrelor  (BRILINTA ) 90 MG TABS tablet Take 1 tablet (90 mg total) by mouth 2 (two) times daily.   [DISCONTINUED] amLODipine  (NORVASC ) 5 MG tablet Take 5 mg by mouth daily.   [DISCONTINUED] losartan  (COZAAR ) 50 MG tablet Take 50 mg by mouth daily.   [DISCONTINUED] rosuvastatin  (CRESTOR ) 40 MG tablet Take 40 mg by mouth daily.   amLODipine  (NORVASC ) 5 MG tablet Take 1 tablet (5 mg total) by mouth daily.   losartan  (COZAAR ) 50 MG tablet Take 1 tablet (50 mg total) by mouth daily.   rosuvastatin  (CRESTOR ) 40 MG tablet Take 1 tablet (40 mg total) by  mouth daily.   [DISCONTINUED] lisinopril  (ZESTRIL ) 2.5 MG tablet Take 1 tablet (2.5 mg total) by mouth daily.   [DISCONTINUED] metoprolol  tartrate (LOPRESSOR ) 25 MG tablet Take 0.5 tablets (12.5 mg total) by mouth 2 (two) times daily.   No facility-administered encounter medications on file as of 06/07/2024.    Follow-up: Return in about 3 months (around 09/07/2024) for HTN, DM.   Maguadalupe Lata R Tarvaris Puglia, FNP

## 2024-06-07 NOTE — Assessment & Plan Note (Signed)
 Continue aspirin  81 mg daily, Brilinta  90 mg twice daily metoprolol  12.5 mg daily, rosuvastatin  40 mg daily, Zetia  10 mg daily Maintain close follow-up with cardiology

## 2024-06-08 ENCOUNTER — Other Ambulatory Visit: Payer: Self-pay

## 2024-06-08 ENCOUNTER — Ambulatory Visit: Payer: Self-pay | Admitting: Emergency Medicine

## 2024-06-09 LAB — MICROALBUMIN / CREATININE URINE RATIO
Creatinine, Urine: 94.7 mg/dL
Microalb/Creat Ratio: 44 mg/g{creat} — ABNORMAL HIGH (ref 0–29)
Microalbumin, Urine: 41.2 ug/mL

## 2024-06-10 ENCOUNTER — Ambulatory Visit: Payer: Self-pay | Admitting: Nurse Practitioner

## 2024-06-10 ENCOUNTER — Other Ambulatory Visit: Payer: Self-pay

## 2024-06-10 MED ORDER — REPATHA SURECLICK 140 MG/ML ~~LOC~~ SOAJ: 1.0000 mL | SUBCUTANEOUS | 3 refills | Status: AC

## 2024-06-10 NOTE — Telephone Encounter (Signed)
 Patient made aware of approval. Labs in 3 months

## 2024-06-10 NOTE — Addendum Note (Signed)
 Addended by: Carsyn Taubman D on: 06/10/2024 11:52 AM   Modules accepted: Orders

## 2024-06-14 ENCOUNTER — Other Ambulatory Visit: Payer: Self-pay

## 2024-06-14 NOTE — Transitions of Care (Post Inpatient/ED Visit) (Signed)
 Transition of Care week 3  Visit Note  06/14/2024  Name: Virginia [name] MRN: [id number]          DOB: [birth year]  Situation: Patient enrolled in [facility] 30-day program. Visit completed with patient by telephone.   Background: patient hospitalized from 05/26/24 to 05/28/24 for unstable angina.  Had PCI on 05/27/24.  Initial Transition Care Management Follow-up Telephone Call    Past Medical History:  Diagnosis Date   Anemia    CAD (coronary artery disease)    a. 06/2015 BMS x2 to LCx. b. NSTEMI 11/2020 s/p DES to prox LAD; residual disease treated medically.   CKD (chronic kidney disease), stage II    Diabetes mellitus (HCC)    GERD (gastroesophageal reflux disease)    ?   GI bleeding    2016 - bleeding polyp clipped   Hyperlipidemia with target LDL less than 70    Hypertension    STEMI (ST elevation myocardial infarction) (HCC) 06/16/2015   BMS x 2 CFX   Thyroid  nodule    benign    Assessment: Patient Reported Symptoms: Cognitive Cognitive Status: No symptoms reported, Normal speech and language skills, Insightful and able to interpret abstract concepts, Alert and oriented to person, place, and time      Neurological Neurological Review of Symptoms: No symptoms reported    HEENT HEENT Symptoms Reported: No symptoms reported HEENT Comment: patient states her doctor referred her to the eye doctor. She states the first available appointment was 11/2024.  Patient states office said they would put her on a waiting list.  Advised to call back periodically and ask about open appointment as well.  Patient states her doctor referred her to the dentist due to having a few broken teeth. patient states she has not heard from anyone yet to schedule. Advised patient to give it a few more days to hear from dental office. Advised if she does not hear from dental office to contact this RN case manager for assistance.    Cardiovascular Cardiovascular Symptoms Reported: No symptoms reported Other  Cardiovascular Symptoms: Patient reports having hospital follow up visit with cardiology on 06/06/24 and with primary care provider on 06/07/24.    Respiratory Respiratory Symptoms Reported: No symptoms reported    Endocrine Endocrine Symptoms Reported: No symptoms reported    Gastrointestinal Gastrointestinal Symptoms Reported: No symptoms reported      Genitourinary Genitourinary Symptoms Reported: No symptoms reported    Integumentary Integumentary Symptoms Reported: No symptoms reported    Musculoskeletal Musculoskelatal Symptoms Reviewed: No symptoms reported        Psychosocial Psychosocial Symptoms Reported: No symptoms reported         There were no vitals filed for this visit.  Medications Reviewed Today     Reviewed by Larayah Clute E, RN (Registered Nurse) on 06/14/24 at 1026  Med List Status: <None>   Medication Order Taking? Sig Documenting Provider Last Dose Status Informant  acetaminophen  (TYLENOL ) 500 MG tablet [id number] Yes Take 1 tablet (500 mg total) by mouth every 6 (six) hours as needed. Tilford Bertram HERO, FNP  Active Self, Pharmacy Records  amLODipine  (NORVASC ) 5 MG tablet [id number] Yes Take 1 tablet (5 mg total) by mouth daily. Paseda, Folashade R, FNP  Active   aspirin  EC 81 MG tablet [id number] Yes Take 1 tablet (81 mg total) by mouth daily. Stroud, Natalie M, FNP  Active Self, Pharmacy Records  empagliflozin  (JARDIANCE ) 10 MG TABS tablet [id number] Yes Take 1 tablet (10 mg total) by  mouth daily before breakfast. Meditz Manuelita NOVAK, NP  Active   Evolocumab  (REPATHA  SURECLICK) 140 MG/ML SOAJ [id number] Yes Inject 140 mg into the skin every 14 (fourteen) days. Ladona Heinz, MD  Active   ezetimibe  (ZETIA ) 10 MG tablet [id number] Yes Take 1 tablet (10 mg total) by mouth at bedtime. Ladona Heinz, MD  Active     Discontinued 11/26/20 1412 (Stop Taking at Discharge) losartan  (COZAAR ) 50 MG tablet [id number] Yes Take 1 tablet (50 mg total) by mouth daily. Paseda, Folashade R,  FNP  Active   metoprolol  succinate (TOPROL -XL) 25 MG 24 hr tablet [id number] Yes Take 1 tablet (25 mg total) by mouth daily. Ladona Heinz, MD  Active     Discontinued 11/26/20 1412 (Stop Taking at Discharge) nitroGLYCERIN  (NITROSTAT ) 0.4 MG SL tablet [id number] Yes Place 1 tablet (0.4 mg total) under the tongue every 5 (five) minutes as needed for chest pain. Ladona Heinz, MD  Active   pantoprazole  (PROTONIX ) 40 MG tablet [id number] Yes Take 1 tablet (40 mg total) by mouth daily. Maccia, Melissa D, RPH-CPP  Active   rosuvastatin  (CRESTOR ) 40 MG tablet [id number] Yes Take 1 tablet (40 mg total) by mouth daily. Paseda, Folashade R, FNP  Active   ticagrelor  (BRILINTA ) 90 MG TABS tablet [id number] Yes Take 1 tablet (90 mg total) by mouth 2 (two) times daily. Tull Manuelita NOVAK, NP  Active             Goals Addressed             This Visit's Progress    VBCI Transitions of Care (TOC) Care Plan       Problems:  Recent Hospitalization for treatment of Unstable angina/ NSTEMI Knowledge Deficit Related to angina/ NSTEMI, medication management and SDOH barrier: transportation  Goal:  Over the next 30 days, the patient will not experience hospital readmission  Interventions:  Transitions of Care: Provided patient contact phone number for [employer] guide and advised her to return Angela's call for transportation assistance. Patient states she lost the paper she wrote the number on.  Assessed for chest pain, shortness of breath, dizziness/lightheadedness Assessed for signs of infection Confirmed patient picked up Zetia  prescription  Reviewed upcoming provider visits Advised to keep follow up visits with provider.  Advised to call 911 for severe symptoms regarding SOB, chest pain, stroke- like symptoms Advised to call primary care provider for mild/ moderate symptoms/ concerns Reviewed hospital follow up visit notes for cardiology and primary care provider.   Medications  reviewed and compliance discussed. Encouraged patient to continue light exercise at least 30 minutes per day Advised patient to notify case manager if she does not receive a call regarding her dental referral. Discussed following a low salt/ heart healthy/ low carb diet.     Patient Self Care Activities:  Attend all scheduled provider appointments Call pharmacy for medication refills 3-7 days in advance of running out of medications Call provider office for new concerns or questions  Notify RN Care Manager of [facility] call rescheduling needs or for questions/ concerns Take medications as prescribed   Confirmed patient understands  Call 911 for severe symptoms such as shortness of breath, chest pain, stroke like symptoms. Continue to do light exercise at least 30 minutes daily Call RN case manager if you do not hear from the dental office regarding your referral  Plan:  Telephone follow up appointment with care management team member scheduled for:  06/21/24 at 11 am  Recommendation:   Continue Current Plan of Care  Follow Up Plan:   Telephone follow up appointment date/time:  06/21/24 AT 11 AM  Arvin Seip RN, BSN, CCM [place]  [facility], Population Health Case Manager Phone: [phone]

## 2024-06-14 NOTE — Patient Instructions (Signed)
 Visit Information  Thank you for taking time to visit with me today. Please don't hesitate to contact me if I can be of assistance to you before our next scheduled telephone appointment.  Our next appointment is by telephone on 06/21/24 at 11 am  Following is a copy of your care plan:   Goals Addressed             This Visit's Progress    VBCI Transitions of Care (TOC) Care Plan       Problems:  Recent Hospitalization for treatment of Unstable angina/ NSTEMI Knowledge Deficit Related to angina/ NSTEMI, medication management and SDOH barrier: transportation  Goal:  Over the next 30 days, the patient will not experience hospital readmission  Interventions:  Transitions of Care: Provided patient contact phone number for [employer] guide and advised her to return Angela's call for transportation assistance. Patient states she lost the paper she wrote the number on.  Assessed for chest pain, shortness of breath, dizziness/lightheadedness Assessed for signs of infection Confirmed patient picked up Zetia  prescription  Reviewed upcoming provider visits Advised to keep follow up visits with provider.  Advised to call 911 for severe symptoms regarding SOB, chest pain, stroke- like symptoms Advised to call primary care provider for mild/ moderate symptoms/ concerns Reviewed hospital follow up visit notes for cardiology and primary care provider.   Medications reviewed and compliance discussed. Encouraged patient to continue light exercise at least 30 minutes per day Advised patient to notify case manager if she does not receive a call regarding her dental referral. Discussed following a low salt/ heart healthy/ low carb diet.     Patient Self Care Activities:  Attend all scheduled provider appointments Call pharmacy for medication refills 3-7 days in advance of running out of medications Call provider office for new concerns or questions  Notify RN Care Manager  of [facility] call rescheduling needs or for questions/ concerns Take medications as prescribed   Confirmed patient understands  Call 911 for severe symptoms such as shortness of breath, chest pain, stroke like symptoms. Continue to do light exercise at least 30 minutes daily Call RN case manager if you do not hear from the dental office regarding your referral  Plan:  Telephone follow up appointment with care management team member scheduled for:  06/21/24 at 11 am        The patient verbalized understanding of instructions, educational materials, and care plan provided today and DECLINED offer to receive copy of patient instructions, educational materials, and care plan.   The patient has been provided with contact information for the care management team and has been advised to call with any health related questions or concerns.   Please call the care guide team at [phone] if you need to cancel or reschedule your appointment.   Please call the Suicide and Crisis Lifeline: 988 call 1-800-273-TALK (toll free, 24 hour hotline) if you are experiencing a Mental Health or Behavioral Health Crisis or need someone to talk to.  Arvin Seip RN, BSN, CCM [employer], Population Health Case Manager Phone: [phone]

## 2024-06-18 ENCOUNTER — Other Ambulatory Visit: Payer: Self-pay

## 2024-06-20 ENCOUNTER — Other Ambulatory Visit (HOSPITAL_COMMUNITY): Payer: Self-pay

## 2024-06-20 ENCOUNTER — Other Ambulatory Visit: Payer: Self-pay

## 2024-06-20 NOTE — Patient Instructions (Signed)
 Visit Information  Thank you for taking time to visit with me today. Please don't hesitate to contact me if I can be of assistance to you before our next scheduled telephone appointment.  Our next appointment is by telephone on 06/26/24 at 11 am  Following is a copy of your care plan:   Goals Addressed             This Visit's Progress    VBCI Transitions of Care (TOC) Care Plan       Problems:  Recent Hospitalization for treatment of Unstable angina/ NSTEMI Knowledge Deficit Related to angina/ NSTEMI, medication management and SDOH barrier: transportation  Goal:  Over the next 30 days, the patient will not experience hospital readmission  Interventions:  Transitions of Care: Assessed for chest pain, shortness of breath, dizziness/lightheadedness Assessed for signs of infection Reviewed upcoming provider visits Advised to keep follow up visits with provider.  Advised to call 911 for severe symptoms regarding SOB, chest pain, stroke- like symptoms Advised to call primary care provider for mild/ moderate symptoms/ concerns Medications reviewed and compliance discussed. [place] pharmacy called to confirm patient has 2 prescriptions there: losartan  and rosuvastatin .  Called  [facility] pharmacy and  spoke with Molly to request prescriptions at [place] pharmacy: losartan  and rosuvastatin  be transferred at patients request.  Encouraged patient to continue light exercise at least 30 minutes per day Advised to continue following a low salt/ heart healthy/ low carb diet.  Message sent to Lum Louis NP with cardiology office informing her of patients reported symptoms: Patient states symptoms are off and on groin pain at site of cardiac cath occasionally throbbing to the point of having to take tylenol .  Patient denies any swelling, redness, drainage, fever or pain to touch.  Advised patient to increase her water intake, consider eating prunes and drinking her coffee to help  with constipation symptoms. Advised patient no relief to talk with her doctor and/ or pharmacist about an OTC medication.     Patient Self Care Activities:  Attend all scheduled provider appointments Call pharmacy for medication refills 3-7 days in advance of running out of medications Call provider office for new concerns or questions  Notify RN Care Manager of [facility] call rescheduling needs or for questions/ concerns Take medications as prescribed   Call 911 for severe symptoms such as shortness of breath, chest pain, stroke like symptoms. Continue to do light exercise at least 30 minutes daily Call RN case manager if you do not hear from the dental office regarding your referral Please notify the RN case manager if your prescriptions for losartan  and rosuvastatin  do not get transferred to your [facility] pharmacy  increase your water intake, consider eating prunes and drinking her coffee to help with constipation symptoms. If no relief to talk with your doctor and/ or pharmacist about an OTC medication.  Plan:  Telephone follow up appointment with care management team member scheduled for:  06/26/24 at 11 am        The patient verbalized understanding of instructions, educational materials, and care plan provided today and DECLINED offer to receive copy of patient instructions, educational materials, and care plan.   The patient has been provided with contact information for the care management team and has been advised to call with any health related questions or concerns.   Please call the care guide team at [phone] if you need to cancel or reschedule your appointment.   Please call the Suicide and Crisis Lifeline:  988 call 1-800-273-TALK (toll free, 24 hour hotline) if you are experiencing a Mental Health or Behavioral Health Crisis or need someone to talk to.  Arvin Seip RN, BSN, CCM [employer], Population Health Case Manager Phone:  [phone]

## 2024-06-20 NOTE — Transitions of Care (Post Inpatient/ED Visit) (Signed)
 Transition of Care week 4  Visit Note  06/20/2024  Name: Virginia [name] MRN: [id number]          DOB: Apr 19, 1961  Situation: Patient enrolled in [facility] 30-day program. Visit completed with patient by telephone.   Background: patient hospitalized from 05/26/24 to 05/28/24 for unstable angina. Had PCI on 05/27/24.   Initial Transition Care Management Follow-up Telephone Call    Past Medical History:  Diagnosis Date   Anemia    CAD (coronary artery disease)    a. 06/2015 BMS x2 to LCx. b. NSTEMI 11/2020 s/p DES to prox LAD; residual disease treated medically.   CKD (chronic kidney disease), stage II    Diabetes mellitus (HCC)    GERD (gastroesophageal reflux disease)    ?   GI bleeding    2016 - bleeding polyp clipped   Hyperlipidemia with target LDL less than 70    Hypertension    STEMI (ST elevation myocardial infarction) (HCC) 06/16/2015   BMS x 2 CFX   Thyroid  nodule    benign    Assessment: Patient Reported Symptoms: Cognitive Cognitive Status: No symptoms reported, Alert and oriented to person, place, and time, Insightful and able to interpret abstract concepts, Normal speech and language skills      Neurological Neurological Review of Symptoms: No symptoms reported    HEENT HEENT Symptoms Reported: No symptoms reported      Cardiovascular Cardiovascular Symptoms Reported: Other: (Groin pain) Other Cardiovascular Symptoms: Patient states symptoms are off and on groin pain at site of cardiac cath occasionally throbbing to the point of having to take tylenol .  Patient reports she has noticed it several times at night and while she is walking.  Patient denies any swelling, redness, drainage, fever or pain to touch. Does patient have uncontrolled Hypertension?: No Cardiovascular Management Strategies: Routine screening  Respiratory Respiratory Symptoms Reported: No symptoms reported    Endocrine Endocrine Symptoms Reported: No symptoms reported    Gastrointestinal  Gastrointestinal Symptoms Reported: Constipation Additional Gastrointestinal Details: patient reports having mild constipation. Gastrointestinal Management Strategies: Diet modification    Genitourinary Genitourinary Symptoms Reported: No symptoms reported    Integumentary Integumentary Symptoms Reported: No symptoms reported    Musculoskeletal Musculoskelatal Symptoms Reviewed: No symptoms reported        Psychosocial Psychosocial Symptoms Reported: No symptoms reported         There were no vitals filed for this visit.  Medications Reviewed Today     Reviewed by Gilmer Kaminsky E, RN (Registered Nurse) on 06/20/24 at 1208  Med List Status: <None>   Medication Order Taking? Sig Documenting Provider Last Dose Status Informant  acetaminophen  (TYLENOL ) 500 MG tablet [id number] Yes Take 1 tablet (500 mg total) by mouth every 6 (six) hours as needed. Tilford Bertram HERO, FNP  Active Self, Pharmacy Records  amLODipine  (NORVASC ) 5 MG tablet [id number] Yes Take 1 tablet (5 mg total) by mouth daily. Paseda, Folashade R, FNP  Active   aspirin  EC 81 MG tablet [id number] Yes Take 1 tablet (81 mg total) by mouth daily. Stroud, Natalie M, FNP  Active Self, Pharmacy Records  empagliflozin  (JARDIANCE ) 10 MG TABS tablet [id number] Yes Take 1 tablet (10 mg total) by mouth daily before breakfast. Haskins Manuelita NOVAK, NP  Active   Evolocumab  (REPATHA  SURECLICK) 140 MG/ML SOAJ [id number] Yes Inject 140 mg into the skin every 14 (fourteen) days. Ladona Heinz, MD  Active   ezetimibe  (ZETIA ) 10 MG tablet [id number] Yes Take 1 tablet (10 mg total) by  mouth at bedtime. Ladona Heinz, MD  Active     Discontinued 11/26/20 1412 (Stop Taking at Discharge) losartan  (COZAAR ) 50 MG tablet [id number] Yes Take 1 tablet (50 mg total) by mouth daily. Paseda, Folashade R, FNP  Active   metoprolol  succinate (TOPROL -XL) 25 MG 24 hr tablet [id number] Yes Take 1 tablet (25 mg total) by mouth daily. Ladona Heinz, MD  Active     Discontinued  11/26/20 1412 (Stop Taking at Discharge) nitroGLYCERIN  (NITROSTAT ) 0.4 MG SL tablet [id number] Yes Place 1 tablet (0.4 mg total) under the tongue every 5 (five) minutes as needed for chest pain. Ladona Heinz, MD  Active   pantoprazole  (PROTONIX ) 40 MG tablet [id number] Yes Take 1 tablet (40 mg total) by mouth daily. Maccia, Melissa D, RPH-CPP  Active   rosuvastatin  (CRESTOR ) 40 MG tablet [id number] Yes Take 1 tablet (40 mg total) by mouth daily. Paseda, Folashade R, FNP  Active   ticagrelor  (BRILINTA ) 90 MG TABS tablet [id number] Yes Take 1 tablet (90 mg total) by mouth 2 (two) times daily. Mattes Manuelita NOVAK, NP  Active             Goals Addressed             This Visit's Progress    VBCI Transitions of Care (TOC) Care Plan       Problems:  Recent Hospitalization for treatment of Unstable angina/ NSTEMI Knowledge Deficit Related to angina/ NSTEMI, medication management and SDOH barrier: transportation  Goal:  Over the next 30 days, the patient will not experience hospital readmission  Interventions:  Transitions of Care: Assessed for chest pain, shortness of breath, dizziness/lightheadedness Assessed for signs of infection Reviewed upcoming provider visits Advised to keep follow up visits with provider.  Advised to call 911 for severe symptoms regarding SOB, chest pain, stroke- like symptoms Advised to call primary care provider for mild/ moderate symptoms/ concerns Medications reviewed and compliance discussed. [place] pharmacy called to confirm patient has 2 prescriptions there: losartan  and rosuvastatin .  [facility] pharmacy and  spoke with Molly to request prescriptions at [place] pharmacy: losartan  and rosuvastatin  be transferred at patients request.  Encouraged patient to continue light exercise at least 30 minutes per day Advised to continue following a low salt/ heart healthy/ low carb diet.  Message sent to Lum Louis NP with cardiology office informing  her of patients reported symptoms: Patient states symptoms are off and on groin pain at site of cardiac cath occasionally throbbing to the point of having to take tylenol .  Patient denies any swelling, redness, drainage, fever or pain to touch.  Advised patient to increase her water intake, consider eating prunes and drinking her coffee to help with constipation symptoms. Advised patient no relief to talk with her doctor and/ or pharmacist about an OTC medication.     Patient Self Care Activities:  Attend all scheduled provider appointments Call pharmacy for medication refills 3-7 days in advance of running out of medications Call provider office for new concerns or questions  Notify RN Care Manager of [facility] call rescheduling needs or for questions/ concerns Take medications as prescribed   Call 911 for severe symptoms such as shortness of breath, chest pain, stroke like symptoms. Continue to do light exercise at least 30 minutes daily Call RN case manager if you do not hear from the dental office regarding your referral Please notify the RN case manager if your prescriptions for losartan  and rosuvastatin  do not get transferred to your [facility]  pharmacy  increase your water intake, consider eating prunes and drinking her coffee to help with constipation symptoms. If no relief to talk with your doctor and/ or pharmacist about an OTC medication.  Plan:  Telephone follow up appointment with care management team member scheduled for:  06/26/24 at 11 am        Recommendation:   Continue Current Plan of Care  Follow Up Plan:   Telephone follow-up in 1 week 06/26/24 at 11 am Will notify Lum Louis, NP of patients reported symptom of Groin pain at cardiac cath site.   Arvin Seip RN, BSN, CCM [employer], Population Health Case Manager Phone: [phone]

## 2024-06-21 ENCOUNTER — Telehealth (HOSPITAL_COMMUNITY): Payer: Self-pay

## 2024-06-21 NOTE — Telephone Encounter (Signed)
 Pt insurance is active and benefits verified through [facility]. Co-pay $4, DED $0/$0 met, out of pocket $0/$0 met, co-insurance 0%. No pre-authorization required for 9 codes; G codes require pre-auth. 2024/07/05 @ 11:10am, spoke with Dolores ORN., REF# Dolores ORN. 2024-07-05  TCR/ICR? TCR Visit(date of service)limitation? No (able to use 72 sessions) Can multiple codes be used on the same date of service/visit?(IF ITS A LIMIT) Yes  Is this a lifetime maximum or an annual maximum? Annual Has the member used any of these services to date? No Is there a time limit (weeks/months) on start of program and/or program completion? 6 months from event (7/24)

## 2024-06-21 NOTE — Telephone Encounter (Signed)
 Called patient to see if she was interested in participating in the Cardiac Rehab Program. Patient will come in for orientation on 8/26 and will attend the 8:15 exercise class. Patient is TCR ONLY but has 72 sessions (no visit limits) to use.  Pensions consultant.

## 2024-06-26 ENCOUNTER — Other Ambulatory Visit: Payer: Self-pay

## 2024-06-26 NOTE — Transitions of Care (Post Inpatient/ED Visit) (Signed)
 Transition of Care week 4  Visit Note  06/26/2024  Name: Virginia [name] MRN: [id number]          DOB: [birth year]  Situation: Patient enrolled in [facility] 30-day program. Visit completed with patient by telephone.   Background: patient hospitalized from 05/26/24 to 05/28/24 for unstable angina. Had PCI on 05/27/24.      Past Medical History:  Diagnosis Date   Anemia    CAD (coronary artery disease)    a. 06/2015 BMS x2 to LCx. b. NSTEMI 11/2020 s/p DES to prox LAD; residual disease treated medically.   CKD (chronic kidney disease), stage II    Diabetes mellitus (HCC)    GERD (gastroesophageal reflux disease)    ?   GI bleeding    2016 - bleeding polyp clipped   Hyperlipidemia with target LDL less than 70    Hypertension    STEMI (ST elevation myocardial infarction) (HCC) 06/16/2015   BMS x 2 CFX   Thyroid  nodule    benign    Assessment: Patient Reported Symptoms: Cognitive Cognitive Status: No symptoms reported, Normal speech and language skills, Insightful and able to interpret abstract concepts, Alert and oriented to person, place, and time      Neurological Neurological Review of Symptoms: No symptoms reported    HEENT HEENT Symptoms Reported: No symptoms reported      Cardiovascular Cardiovascular Symptoms Reported: No symptoms reported Other Cardiovascular Symptoms: Patient states, I am doing great.  She denies any symptoms.  She states she is scheduled to start cardiac rehab on 07/02/24. Patient states the groin pain is happening less frequently and with less intensity.  Patient states she doesn't have to take an OTC to help with with pain anymore.  She states she didn't hear from provider or provider office regarding the reported symptoms of groin pain. Cardiovascular Management Strategies: Routine screening, Medication therapy  Respiratory Respiratory Symptoms Reported: No symptoms reported    Endocrine Endocrine Symptoms Reported: No symptoms reported     Gastrointestinal Gastrointestinal Symptoms Reported: No symptoms reported Additional Gastrointestinal Details: Patient reports the constipation has resolved.      Genitourinary Genitourinary Symptoms Reported: No symptoms reported    Integumentary Integumentary Symptoms Reported: No symptoms reported    Musculoskeletal Musculoskelatal Symptoms Reviewed: No symptoms reported        Psychosocial Psychosocial Symptoms Reported: No symptoms reported         There were no vitals filed for this visit.  Medications Reviewed Today     Reviewed by Leida Luton E, RN (Registered Nurse) on 06/26/24 at 1110  Med List Status: <None>   Medication Order Taking? Sig Documenting Provider Last Dose Status Informant  acetaminophen  (TYLENOL ) 500 MG tablet [id number] Yes Take 1 tablet (500 mg total) by mouth every 6 (six) hours as needed. Tilford Bertram HERO, FNP  Active Self, Pharmacy Records  amLODipine  (NORVASC ) 5 MG tablet [id number] Yes Take 1 tablet (5 mg total) by mouth daily. Paseda, Folashade R, FNP  Active   aspirin  EC 81 MG tablet [id number] Yes Take 1 tablet (81 mg total) by mouth daily. Stroud, Natalie M, FNP  Active Self, Pharmacy Records  empagliflozin  (JARDIANCE ) 10 MG TABS tablet [id number] Yes Take 1 tablet (10 mg total) by mouth daily before breakfast. Clowers Manuelita NOVAK, NP  Active   Evolocumab  (REPATHA  SURECLICK) 140 MG/ML EMMANUEL [id number] Yes Inject 140 mg into the skin every 14 (fourteen) days. Ladona Heinz, MD  Active   ezetimibe  (ZETIA ) 10 MG tablet [id number] Yes  Take 1 tablet (10 mg total) by mouth at bedtime. Ladona Heinz, MD  Active     Discontinued 11/26/20 1412 (Stop Taking at Discharge) losartan  (COZAAR ) 50 MG tablet [id number] Yes Take 1 tablet (50 mg total) by mouth daily. Paseda, Folashade R, FNP  Active   metoprolol  succinate (TOPROL -XL) 25 MG 24 hr tablet [id number] Yes Take 1 tablet (25 mg total) by mouth daily. Ladona Heinz, MD  Active     Discontinued 11/26/20 1412 (Stop Taking  at Discharge) nitroGLYCERIN  (NITROSTAT ) 0.4 MG SL tablet [id number] Yes Place 1 tablet (0.4 mg total) under the tongue every 5 (five) minutes as needed for chest pain. Ladona Heinz, MD  Active   pantoprazole  (PROTONIX ) 40 MG tablet [id number] Yes Take 1 tablet (40 mg total) by mouth daily. Maccia, Melissa D, RPH-CPP  Active   rosuvastatin  (CRESTOR ) 40 MG tablet [id number] Yes Take 1 tablet (40 mg total) by mouth daily. Paseda, Folashade R, FNP  Active   ticagrelor  (BRILINTA ) 90 MG TABS tablet [id number] Yes Take 1 tablet (90 mg total) by mouth 2 (two) times daily. Lefever Manuelita NOVAK, NP  Active             Goals      [facility] Transitions of Care [place]) Care Plan     Problems:  Recent Hospitalization for treatment of Unstable angina/ NSTEMI Knowledge Deficit Related to angina/ NSTEMI, medication management and SDOH barrier: transportation  Goal:  Over the next 30 days, the patient will not experience hospital readmission  Interventions:  Transitions of Care: Assessed for chest pain, shortness of breath, dizziness/lightheadedness Assessed for signs of infection Reviewed upcoming provider visits Advised to keep follow up visits with provider.  Advised to call 911 for severe symptoms regarding SOB, chest pain, stroke- like symptoms Advised to call primary care provider for mild/ moderate symptoms/ concerns Medications reviewed and compliance discussed. Confirmed patient received her losartan  and rosuvastatin  Encouraged patient to continue light exercise at least 30 minutes per day-  Patient to start cardiac rehab on 07/02/24.  Advised to continue following a low salt/ heart healthy/ low carb diet.  Message sent to Lum Louis NP with cardiology office informing her of patients reported symptoms: Patient states symptoms are off and on groin pain at site of cardiac cath occasionally throbbing to the point of having to take tylenol .  Patient denies any swelling, redness, drainage, fever or pain to  touch.  Advised patient to increase her water intake, consider eating prunes and drinking her coffee to help with constipation symptoms. Advised patient no relief to talk with her doctor and/ or pharmacist about an OTC medication.  Assessed ongoing groin pain from cath site- Advised to call provider if pain persist/ worsens.  Discussed and offered ongoing case management follow up with longitudinal nurse ( CCM) - Patient declined.     Patient Self Care Activities:  Attend all scheduled provider appointments Call pharmacy for medication refills 3-7 days in advance of running out of medications Call provider office for new concerns or questions  Notify RN Care Manager of [facility] call rescheduling needs or for questions/ concerns Take medications as prescribed   Call 911 for severe symptoms such as shortness of breath, chest pain, stroke like symptoms. Continue to do light exercise at least 30 minutes daily increase your water intake, consider eating prunes and drinking her coffee to help with constipation symptoms. If no relief to talk with your doctor and/ or pharmacist about an OTC medication.  Call provider if groin  pain persists or worsens.  Plan:  Telephone follow up appointment with care management team member scheduled for:  07/03/24 at 11 am         Recommendation:   Continue Current Plan of Care Cardiac rehab scheduled to start on 07/02/24. Discussed and offered ongoing case management follow up with CCM program - patient declined.   Follow Up Plan:   Telephone follow-up in 1 week 07/03/24 at 11 am  Arvin Seip RN, BSN, CCM [facility], Population Health Case Manager Phone: [phone]

## 2024-06-26 NOTE — Patient Instructions (Signed)
 Visit Information  Thank you for taking time to visit with me today. Please don't hesitate to contact me if I can be of assistance to you before our next scheduled telephone appointment.  Our next appointment is by telephone on 07/03/24 at 11 am  Following is a copy of your care plan:   Goals Addressed             This Visit's Progress    [facility]I Transitions of Care (TOC) Care Plan       Problems:  Recent Hospitalization for treatment of Unstable angina/ NSTEMI Knowledge Deficit Related to angina/ NSTEMI, medication management and SDOH barrier: transportation  Goal:  Over the next 30 days, the patient will not experience hospital readmission  Interventions:  Transitions of Care: Assessed for chest pain, shortness of breath, dizziness/lightheadedness Assessed for signs of infection Reviewed upcoming provider visits Advised to keep follow up visits with provider.  Advised to call 911 for severe symptoms regarding SOB, chest pain, stroke- like symptoms Advised to call primary care provider for mild/ moderate symptoms/ concerns Medications reviewed and compliance discussed. Confirmed patient received her losartan  and rosuvastatin  Encouraged patient to continue light exercise at least 30 minutes per day-  Patient to start cardiac rehab on 07/02/24.  Advised to continue following a low salt/ heart healthy/ low carb diet.  Message sent to Lum Louis NP with cardiology office informing her of patients reported symptoms: Patient states symptoms are off and on groin pain at site of cardiac cath occasionally throbbing to the point of having to take tylenol .  Patient denies any swelling, redness, drainage, fever or pain to touch.  Advised patient to increase her water intake, consider eating prunes and drinking her coffee to help with constipation symptoms. Advised patient no relief to talk with her doctor and/ or pharmacist about an OTC medication.  Discussed and offered ongoing case  management follow up with longitudinal nurse ( CCM) - Patient declined.     Patient Self Care Activities:  Attend all scheduled provider appointments Call pharmacy for medication refills 3-7 days in advance of running out of medications Call provider office for new concerns or questions  Notify RN Care Manager of [facility] call rescheduling needs or for questions/ concerns Take medications as prescribed   Call 911 for severe symptoms such as shortness of breath, chest pain, stroke like symptoms. Continue to do light exercise at least 30 minutes daily increase your water intake, consider eating prunes and drinking her coffee to help with constipation symptoms. If no relief to talk with your doctor and/ or pharmacist about an OTC medication.  Plan:  Telephone follow up appointment with care management team member scheduled for:  07/03/24 at 11 am        Patient verbalizes understanding of instructions and care plan provided today and agrees to view in MyChart. Active MyChart status and patient understanding of how to access instructions and care plan via MyChart confirmed with patient.     The patient has been provided with contact information for the care management team and has been advised to call with any health related questions or concerns.   Please call the care guide team at [phone] if you need to cancel or reschedule your appointment.   Please call the Suicide and Crisis Lifeline: 988 call the [place]  National Suicide Prevention Lifeline: 716-330-9810 or TTY: (306)727-3884 TTY 540 815 0590) to talk to a trained counselor call 1-800-273-TALK (toll free, 24 hour hotline) if you are experiencing a Mental Health or  Behavioral Health Crisis or need someone to talk to.  Arvin Seip RN, BSN, CCM [employer], Population Health Case Manager Phone: [phone]

## 2024-06-27 ENCOUNTER — Telehealth: Payer: Self-pay | Admitting: Cardiology

## 2024-06-27 MED ORDER — EMPAGLIFLOZIN 10 MG PO TABS: 10.0000 mg | ORAL_TABLET | Freq: Every day | ORAL | 3 refills | Status: DC

## 2024-06-27 MED ORDER — TICAGRELOR 90 MG PO TABS: 90.0000 mg | ORAL_TABLET | Freq: Two times a day (BID) | ORAL | 3 refills | Status: AC

## 2024-06-27 MED ORDER — METOPROLOL SUCCINATE ER 25 MG PO TB24: 25.0000 mg | ORAL_TABLET | Freq: Every day | ORAL | 3 refills | Status: DC

## 2024-06-27 NOTE — Telephone Encounter (Signed)
*  STAT* If patient is at the pharmacy, call can be transferred to refill team.   1. Which medications need to be refilled? (please list name of each medication and dose if known)  empagliflozin  (JARDIANCE ) 10 MG TABS tablet   metoprolol  succinate (TOPROL -XL) 25 MG 24 hr tablet    ticagrelor  (BRILINTA ) 90 MG TABS tablet    2. Which pharmacy/location (including street and city if local pharmacy) is medication to be sent to? [facility] [phone] - [place], [place] - [address]   3. Do they need a 30 day or 90 day supply? 90

## 2024-06-27 NOTE — Telephone Encounter (Signed)
 RX sent in

## 2024-06-28 ENCOUNTER — Other Ambulatory Visit (HOSPITAL_COMMUNITY): Payer: Self-pay

## 2024-07-01 ENCOUNTER — Telehealth (HOSPITAL_COMMUNITY): Payer: Self-pay

## 2024-07-01 ENCOUNTER — Telehealth: Payer: Self-pay | Admitting: Cardiology

## 2024-07-01 ENCOUNTER — Other Ambulatory Visit (HOSPITAL_COMMUNITY): Payer: Self-pay

## 2024-07-01 MED ORDER — METOPROLOL SUCCINATE ER 25 MG PO TB24: 25.0000 mg | ORAL_TABLET | Freq: Every day | ORAL | 3 refills | Status: DC

## 2024-07-01 NOTE — Telephone Encounter (Signed)
 Called to confirm appt for tomorrow morning. Pt voices she will be here.   Alm Parkins MS, ACSM-CEP, CCRP

## 2024-07-01 NOTE — Telephone Encounter (Signed)
 Pt c/o medication issue:  1. Name of Medication: empagliflozin  (JARDIANCE ) 10 MG TABS tablet  metoprolol  succinate (TOPROL -XL) 25 MG 24 hr tablet  2. How are you currently taking this medication (dosage and times per day)?   3. Are you having a reaction (difficulty breathing--STAT)? No  4. What is your medication issue? Pharmacy called stating that they need prior auth for pt's Jardiance  and prescription sent for Metoprolol . Please advise

## 2024-07-01 NOTE — Telephone Encounter (Signed)
 Patient has Jardiance  PA approval on file (see chart media). Approval information provided to pharmacy.

## 2024-07-01 NOTE — Telephone Encounter (Signed)
Refill sent to the pharmacy electronically.  

## 2024-07-02 ENCOUNTER — Encounter (HOSPITAL_COMMUNITY)
Admission: RE | Admit: 2024-07-02 | Discharge: 2024-07-02 | Disposition: A | Discharge status: Home / Self Care | Source: Ambulatory Visit | Attending: Cardiology | Admitting: Cardiology

## 2024-07-02 ENCOUNTER — Telehealth: Payer: Self-pay | Admitting: Pharmacy Technician

## 2024-07-02 ENCOUNTER — Other Ambulatory Visit (HOSPITAL_COMMUNITY): Payer: Self-pay

## 2024-07-02 VITALS — BP 158/72 | HR 77 | Ht 66.25 in | Wt 188.9 lb

## 2024-07-02 DIAGNOSIS — Z955 Presence of coronary angioplasty implant and graft: Secondary | ICD-10-CM | POA: Insufficient documentation

## 2024-07-02 MED ORDER — METOPROLOL SUCCINATE ER 25 MG PO TB24: 25.0000 mg | ORAL_TABLET | Freq: Every day | ORAL | 3 refills | Status: DC

## 2024-07-02 NOTE — Addendum Note (Signed)
 Addended by: BLUFORD RAMP D on: 07/02/2024 11:01 AM   Modules accepted: Orders

## 2024-07-02 NOTE — Telephone Encounter (Signed)
 Pharmacy called in stating they did not receive. can someone send this over please

## 2024-07-02 NOTE — Telephone Encounter (Signed)
 Pt's medication was resent to p's pharmacy as requested. Confirmation received.

## 2024-07-02 NOTE — Progress Notes (Signed)
 Cardiac Individual Treatment Plan  Patient Details  Name: Virginia [name] MRN: [id number] Date of Birth: 24-Dec-1960 Referring Provider:   Flowsheet Row CARDIAC REHAB PHASE II ORIENTATION from 07/02/2024 in [facility]  Referring Provider Ladona Heinz, MD    Initial Encounter Date:  Flowsheet Row CARDIAC REHAB PHASE II ORIENTATION from 07/02/2024 in [facility]  Date 07/02/24    Visit Diagnosis: 05/30/24 DES LAD x2, LCx  Patient's Home Medications on Admission:  Current Outpatient Medications:    acetaminophen  (TYLENOL ) 500 MG tablet, Take 1 tablet (500 mg total) by mouth every 6 (six) hours as needed., Disp: 30 tablet, Rfl: 0   amLODipine  (NORVASC ) 5 MG tablet, Take 1 tablet (5 mg total) by mouth daily., Disp: 90 tablet, Rfl: 1   aspirin  EC 81 MG tablet, Take 1 tablet (81 mg total) by mouth daily., Disp: 30 tablet, Rfl: 6   empagliflozin  (JARDIANCE ) 10 MG TABS tablet, Take 1 tablet (10 mg total) by mouth daily before breakfast., Disp: 90 tablet, Rfl: 3   Evolocumab  (REPATHA  SURECLICK) 140 MG/ML SOAJ, Inject 140 mg into the skin every 14 (fourteen) days., Disp: 6 mL, Rfl: 3   losartan  (COZAAR ) 50 MG tablet, Take 1 tablet (50 mg total) by mouth daily., Disp: 90 tablet, Rfl: 1   nitroGLYCERIN  (NITROSTAT ) 0.4 MG SL tablet, Place 1 tablet (0.4 mg total) under the tongue every 5 (five) minutes as needed for chest pain., Disp: 25 tablet, Rfl: 3   pantoprazole  (PROTONIX ) 40 MG tablet, Take 1 tablet (40 mg total) by mouth daily., Disp: 90 tablet, Rfl: 3   rosuvastatin  (CRESTOR ) 40 MG tablet, Take 1 tablet (40 mg total) by mouth daily., Disp: 90 tablet, Rfl: 1   ticagrelor  (BRILINTA ) 90 MG TABS tablet, Take 1 tablet (90 mg total) by mouth 2 (two) times daily., Disp: 180 tablet, Rfl: 3   ezetimibe  (ZETIA ) 10 MG tablet, Take 1 tablet (10 mg total) by mouth at bedtime. (Patient not taking:  Reported on 07/02/2024), Disp: 30 tablet, Rfl: 1   metoprolol  succinate (TOPROL -XL) 25 MG 24 hr tablet, Take 1 tablet (25 mg total) by mouth daily., Disp: 90 tablet, Rfl: 3  Past Medical History: Past Medical History:  Diagnosis Date   Anemia    CAD (coronary artery disease)    a. 06/2015 BMS x2 to LCx. b. NSTEMI 11/2020 s/p DES to prox LAD; residual disease treated medically.   CKD (chronic kidney disease), stage II    Diabetes mellitus (HCC)    GERD (gastroesophageal reflux disease)    ?   GI bleeding    2016 - bleeding polyp clipped   Hyperlipidemia with target LDL less than 70    Hypertension    STEMI (ST elevation myocardial infarction) (HCC) 06/16/2015   BMS x 2 CFX   Thyroid  nodule    benign    Tobacco Use: Social History   Tobacco Use  Smoking Status Never  Smokeless Tobacco Never    Labs: Review Flowsheet  More data exists      Latest Ref Rng & Units 04/27/2021 08/03/2021 02/07/2023 05/26/2024 05/27/2024  Labs for ITP Cardiac and Pulmonary Rehab  Cholestrol 0 - 200 mg/dL - - - - 876   LDL (calc) 0 - 99 mg/dL - - - - 56   HDL-C >59 mg/dL - - - - 46   Trlycerides <150 mg/dL - - - - 894  Hemoglobin A1c 4.8 - 5.6 % 6.0  6.0  6.0  6.0  6.3  6.3  6.3  6.3  6.4  6.6  6.7  -    Details       Multiple values from one day are sorted in reverse-chronological order         Capillary Blood Glucose: Lab Results  Component Value Date   GLUCAP 104 (H) 05/27/2024   GLUCAP 99 02/08/2023   GLUCAP 107 (H) 02/08/2023   GLUCAP 133 (H) 02/08/2023   GLUCAP 136 (H) 02/08/2023     Exercise Target Goals: Exercise Program Goal: Individual exercise prescription set using results from initial 6 min walk test and THRR while considering  patient's activity barriers and safety.   Exercise Prescription Goal: Initial exercise prescription builds to 30-45 minutes a day of aerobic activity, 2-3 days per week.  Home exercise guidelines will be given to patient during program as part of  exercise prescription that the participant will acknowledge.  Activity Barriers & Risk Stratification:  Activity Barriers & Cardiac Risk Stratification - 07/02/24 1007       Activity Barriers & Cardiac Risk Stratification   Activity Barriers None    Cardiac Risk Stratification Moderate          6 Minute Walk:  6 Minute Walk     Row Name 07/02/24 1052         6 Minute Walk   Phase Initial     Distance 1464 feet     Walk Time 6 minutes     # of Rest Breaks 0     MPH 2.77     METS 3.62     RPE 13     Perceived Dyspnea  1     VO2 Peak 12.66     Symptoms Yes (comment)     Comments Mild shortness of breath. Left calf cramp/ pain 8/10 on pain scale. Resolved with rest/ stretching.     Resting HR 77 bpm     Resting BP 158/72     Resting Oxygen Saturation  100 %     Exercise Oxygen Saturation  during 6 min walk 100 %     Max Ex. HR 105 bpm     Max Ex. BP 160/76     2 Minute Post BP 138/72        Oxygen Initial Assessment:   Oxygen Re-Evaluation:   Oxygen Discharge (Final Oxygen Re-Evaluation):   Initial Exercise Prescription:  Initial Exercise Prescription - 07/02/24 1200       Date of Initial Exercise RX and Referring Provider   Date 07/02/24    Referring Provider Ladona Heinz, MD    Expected Discharge Date 10/04/24      Treadmill   MPH 2    Grade 0    Minutes 15    METs 2.53      Recumbant Elliptical   Level 2    Minutes 15    METs 2.5      Prescription Details   Frequency (times per week) 3    Duration Progress to 30 minutes of continuous aerobic without signs/symptoms of physical distress      Intensity   THRR 40-80% of Max Heartrate 62-125    Ratings of Perceived Exertion 11-13    Perceived Dyspnea 0-4      Progression   Progression Continue to progress workloads to maintain intensity without signs/symptoms of physical distress.      Paramedic  Prescription Yes    Weight 2 lbs    Reps 10-15          Perform  Capillary Blood Glucose checks as needed.  Exercise Prescription Changes:   Exercise Comments:   Exercise Goals and Review:   Exercise Goals     Row Name 07/02/24 1007             Exercise Goals   Increase Physical Activity Yes       Intervention Provide advice, education, support and counseling about physical activity/exercise needs.;Develop an individualized exercise prescription for aerobic and resistive training based on initial evaluation findings, risk stratification, comorbidities and participant's personal goals.       Expected Outcomes Short Term: Attend rehab on a regular basis to increase amount of physical activity.;Long Term: Exercising regularly at least 3-5 days a week.;Long Term: Add in home exercise to make exercise part of routine and to increase amount of physical activity.       Increase Strength and Stamina Yes       Intervention Provide advice, education, support and counseling about physical activity/exercise needs.;Develop an individualized exercise prescription for aerobic and resistive training based on initial evaluation findings, risk stratification, comorbidities and participant's personal goals.       Expected Outcomes Short Term: Increase workloads from initial exercise prescription for resistance, speed, and METs.;Short Term: Perform resistance training exercises routinely during rehab and add in resistance training at home;Long Term: Improve cardiorespiratory fitness, muscular endurance and strength as measured by increased METs and functional capacity ( )       Able to understand and use rate of perceived exertion (RPE) scale Yes       Intervention Provide education and explanation on how to use RPE scale       Expected Outcomes Short Term: Able to use RPE daily in rehab to express subjective intensity level;Long Term:  Able to use RPE to guide intensity level when exercising independently       Knowledge and understanding of Target Heart Rate Range  (THRR) Yes       Intervention Provide education and explanation of THRR including how the numbers were predicted and where they are located for reference       Expected Outcomes Short Term: Able to state/look up THRR;Short Term: Able to use daily as guideline for intensity in rehab;Long Term: Able to use THRR to govern intensity when exercising independently       Able to check pulse independently Yes       Intervention Provide education and demonstration on how to check pulse in carotid and radial arteries.;Review the importance of being able to check your own pulse for safety during independent exercise       Expected Outcomes Short Term: Able to explain why pulse checking is important during independent exercise;Long Term: Able to check pulse independently and accurately       Understanding of Exercise Prescription Yes       Intervention Provide education, explanation, and written materials on patient's individual exercise prescription       Expected Outcomes Short Term: Able to explain program exercise prescription;Long Term: Able to explain home exercise prescription to exercise independently          Exercise Goals Re-Evaluation :   Discharge Exercise Prescription (Final Exercise Prescription Changes):   Nutrition:  Target Goals: Understanding of nutrition guidelines, daily intake of sodium 1500mg , cholesterol 200mg , calories 30% from fat and 7% or less from saturated fats, daily to have  5 or more servings of fruits and vegetables.  Biometrics:  Pre Biometrics - 07/02/24 0902       Pre Biometrics   Waist Circumference 36.75 inches    Hip Circumference 44 inches    Waist to Hip Ratio 0.84 %    Triceps Skinfold 41 mm    % Body Fat 42.3 %    Grip Strength 18 kg    Flexibility 14 in    Single Leg Stand 26.06 seconds           Nutrition Therapy Plan and Nutrition Goals:   Nutrition Assessments:  MEDIFICTS Score Key: >=70 Need to make dietary changes  40-70 Heart  Healthy Diet <= 40 Therapeutic Level Cholesterol Diet    Picture Your Plate Scores: <59 Unhealthy dietary pattern with much room for improvement. 41-50 Dietary pattern unlikely to meet recommendations for good health and room for improvement. 51-60 More healthful dietary pattern, with some room for improvement.  >60 Healthy dietary pattern, although there may be some specific behaviors that could be improved.    Nutrition Goals Re-Evaluation:   Nutrition Goals Re-Evaluation:   Nutrition Goals Discharge (Final Nutrition Goals Re-Evaluation):   Psychosocial: Target Goals: Acknowledge presence or absence of significant depression and/or stress, maximize coping skills, provide positive support system. Participant is able to verbalize types and ability to use techniques and skills needed for reducing stress and depression.  Initial Review & Psychosocial Screening:  Initial Psych Review & Screening - 07/02/24 1027       Initial Review   Current issues with None Identified      Family Dynamics   Good Support System? Yes    Comments Support from husband.      Barriers   Psychosocial barriers to participate in program There are no identifiable barriers or psychosocial needs.      Screening Interventions   Interventions Encouraged to exercise;Provide feedback about the scores to participant    Expected Outcomes Short Term goal: Identification and review with participant of any Quality of Life or Depression concerns found by scoring the questionnaire.;Long Term goal: The participant improves quality of Life and PHQ9 Scores as seen by post scores and/or verbalization of changes          Quality of Life Scores:  Quality of Life - 07/02/24 1346       Quality of Life   Select Quality of Life      Quality of Life Scores   Health/Function Pre 19.43 %    Socioeconomic Pre 23.25 %    Psych/Spiritual Pre 22.29 %    Family Pre 30 %    GLOBAL Pre 22.39 %         Scores of 19  and below usually indicate a poorer quality of life in these areas.  A difference of  2-3 points is a clinically meaningful difference.  A difference of 2-3 points in the total score of the Quality of Life Index has been associated with significant improvement in overall quality of life, self-image, physical symptoms, and general health in studies assessing change in quality of life.  PHQ-9: Review Flowsheet  More data exists      07/02/2024 06/07/2024 08/03/2021 10/06/2020 04/03/2020  Depression screen PHQ 2/9  Decreased Interest 0 0 0 0 0  Down, Depressed, Hopeless 0 0 0 0 0  PHQ - 2 Score 0 0 0 0 0  Altered sleeping 1 0 - - -  Tired, decreased energy 1 3 - - -  Change in appetite 0 0 - - -  Feeling bad or failure about yourself  0 0 - - -  Trouble concentrating 0 0 - - -  Moving slowly or fidgety/restless 0 3 - - -  Suicidal thoughts 0 0 - - -  PHQ-9 Score 2 6 - - -  Difficult doing work/chores Somewhat difficult Not difficult at all - - -   Interpretation of Total Score  Total Score Depression Severity:  1-4 = Minimal depression, 5-9 = Mild depression, 10-14 = Moderate depression, 15-19 = Moderately severe depression, 20-27 = Severe depression   Psychosocial Evaluation and Intervention:   Psychosocial Re-Evaluation:   Psychosocial Discharge (Final Psychosocial Re-Evaluation):   Vocational Rehabilitation: Provide vocational rehab assistance to qualifying candidates.   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          Education: Education Goals: Education classes will be provided on a weekly basis, covering required topics. Participant will state understanding/return demonstration of topics presented.     Core  Videos: Exercise    Move It!  Clinical staff conducted group or individual video education with verbal and written material and guidebook.  Patient learns the recommended Pritikin exercise program. Exercise with the goal of living a long, healthy life. Some of the health benefits of exercise include controlled diabetes, healthier blood pressure levels, improved cholesterol levels, improved heart and lung capacity, improved sleep, and better body composition. Everyone should speak with their doctor before starting or changing an exercise routine.  Biomechanical Limitations Clinical staff conducted group or individual video education with verbal and written material and guidebook.  Patient learns how biomechanical limitations can impact exercise and how we can mitigate and possibly overcome limitations to have an impactful and balanced exercise routine.  Body Composition Clinical staff conducted group or individual video education with verbal and written material and guidebook.  Patient learns that body composition (ratio of muscle mass to fat mass) is a key component to assessing overall fitness, rather than body weight alone. Increased fat mass, especially visceral belly fat, can put us  at increased risk for metabolic syndrome, type 2 diabetes, heart disease, and even death. It is recommended to combine diet and exercise (cardiovascular and resistance training) to improve your body composition. Seek guidance from your physician and exercise physiologist before implementing an exercise routine.  Exercise Action Plan Clinical staff conducted group or individual video education with verbal and written material and guidebook.  Patient learns the recommended strategies to achieve and enjoy long-term exercise adherence, including variety, self-motivation, self-efficacy, and positive decision making. Benefits of exercise include fitness, good health, weight management, more energy, better sleep, less  stress, and overall well-being.  Medical   Heart Disease Risk Reduction Clinical staff conducted group or individual video education with verbal and written material and guidebook.  Patient learns our heart is our most vital organ as it circulates oxygen, nutrients, white blood cells, and hormones throughout the entire body, and carries waste away. Data supports a plant-based eating plan like the Pritikin Program for its effectiveness in slowing progression of and reversing heart disease. The video provides a number of recommendations to address heart disease.   Metabolic Syndrome and Belly Fat  Clinical staff conducted group or individual  video education with verbal and written material and guidebook.  Patient learns what metabolic syndrome is, how it leads to heart disease, and how one can reverse it and keep it from coming back. You have metabolic syndrome if you have 3 of the following 5 criteria: abdominal obesity, high blood pressure, high triglycerides, low HDL cholesterol, and high blood sugar.  Hypertension and Heart Disease Clinical staff conducted group or individual video education with verbal and written material and guidebook.  Patient learns that high blood pressure, or hypertension, is very common in the [place] . Hypertension is largely due to excessive salt intake, but other important risk factors include being overweight, physical inactivity, drinking too much alcohol, smoking, and not eating enough potassium from fruits and vegetables. High blood pressure is a leading risk factor for heart attack, stroke, congestive heart failure, dementia, kidney failure, and premature death. Long-term effects of excessive salt intake include stiffening of the arteries and thickening of heart muscle and organ damage. Recommendations include ways to reduce hypertension and the risk of heart disease.  Diseases of Our Time - Focusing on Diabetes Clinical staff conducted group or individual  video education with verbal and written material and guidebook.  Patient learns why the best way to stop diseases of our time is prevention, through food and other lifestyle changes. Medicine (such as prescription pills and surgeries) is often only a Band-Aid on the problem, not a long-term solution. Most common diseases of our time include obesity, type 2 diabetes, hypertension, heart disease, and cancer. The Pritikin Program is recommended and has been proven to help reduce, reverse, and/or prevent the damaging effects of metabolic syndrome.  Nutrition   Overview of the Pritikin Eating Plan  Clinical staff conducted group or individual video education with verbal and written material and guidebook.  Patient learns about the Pritikin Eating Plan for disease risk reduction. The Pritikin Eating Plan emphasizes a wide variety of unrefined, minimally-processed carbohydrates, like fruits, vegetables, whole grains, and legumes. Go, Caution, and Stop food choices are explained. Plant-based and lean animal proteins are emphasized. Rationale provided for low sodium intake for blood pressure control, low added sugars for blood sugar stabilization, and low added fats and oils for coronary artery disease risk reduction and weight management.  Calorie Density  Clinical staff conducted group or individual video education with verbal and written material and guidebook.  Patient learns about calorie density and how it impacts the Pritikin Eating Plan. Knowing the characteristics of the food you choose will help you decide whether those foods will lead to weight gain or weight loss, and whether you want to consume more or less of them. Weight loss is usually a side effect of the Pritikin Eating Plan because of its focus on low calorie-dense foods.  Label Reading  Clinical staff conducted group or individual video education with verbal and written material and guidebook.  Patient learns about the Pritikin recommended  label reading guidelines and corresponding recommendations regarding calorie density, added sugars, sodium content, and whole grains.  Dining Out - Part 1  Clinical staff conducted group or individual video education with verbal and written material and guidebook.  Patient learns that restaurant meals can be sabotaging because they can be so high in calories, fat, sodium, and/or sugar. Patient learns recommended strategies on how to positively address this and avoid unhealthy pitfalls.  Facts on Fats  Clinical staff conducted group or individual video education with verbal and written material and guidebook.  Patient learns that lifestyle modifications  can be just as effective, if not more so, as many medications for lowering your risk of heart disease. A Pritikin lifestyle can help to reduce your risk of inflammation and atherosclerosis (cholesterol build-up, or plaque, in the artery walls). Lifestyle interventions such as dietary choices and physical activity address the cause of atherosclerosis. A review of the types of fats and their impact on blood cholesterol levels, along with dietary recommendations to reduce fat intake is also included.  Nutrition Action Plan  Clinical staff conducted group or individual video education with verbal and written material and guidebook.  Patient learns how to incorporate Pritikin recommendations into their lifestyle. Recommendations include planning and keeping personal health goals in mind as an important part of their success.  Healthy Mind-Set    Healthy Minds, Bodies, Hearts  Clinical staff conducted group or individual video education with verbal and written material and guidebook.  Patient learns how to identify when they are stressed. Video will discuss the impact of that stress, as well as the many benefits of stress management. Patient will also be introduced to stress management techniques. The way we think, act, and feel has an impact on our  hearts.  How Our Thoughts Can Heal Our Hearts  Clinical staff conducted group or individual video education with verbal and written material and guidebook.  Patient learns that negative thoughts can cause depression and anxiety. This can result in negative lifestyle behavior and serious health problems. Cognitive behavioral therapy is an effective method to help control our thoughts in order to change and improve our emotional outlook.  Additional Videos:  Exercise    Improving Performance  Clinical staff conducted group or individual video education with verbal and written material and guidebook.  Patient learns to use a non-linear approach by alternating intensity levels and lengths of time spent exercising to help burn more calories and lose more body fat. Cardiovascular exercise helps improve heart health, metabolism, hormonal balance, blood sugar control, and recovery from fatigue. Resistance training improves strength, endurance, balance, coordination, reaction time, metabolism, and muscle mass. Flexibility exercise improves circulation, posture, and balance. Seek guidance from your physician and exercise physiologist before implementing an exercise routine and learn your capabilities and proper form for all exercise.  Introduction to Yoga  Clinical staff conducted group or individual video education with verbal and written material and guidebook.  Patient learns about yoga, a discipline of the coming together of mind, breath, and body. The benefits of yoga include improved flexibility, improved range of motion, better posture and core strength, increased lung function, weight loss, and positive self-image. Yoga's heart health benefits include lowered blood pressure, healthier heart rate, decreased cholesterol and triglyceride levels, improved immune function, and reduced stress. Seek guidance from your physician and exercise physiologist before implementing an exercise routine and learn your  capabilities and proper form for all exercise.  Medical   Aging: Enhancing Your Quality of Life  Clinical staff conducted group or individual video education with verbal and written material and guidebook.  Patient learns key strategies and recommendations to stay in good physical health and enhance quality of life, such as prevention strategies, having an advocate, securing a Health Care Proxy and Power of Attorney, and keeping a list of medications and system for tracking them. It also discusses how to avoid risk for bone loss.  Biology of Weight Control  Clinical staff conducted group or individual video education with verbal and written material and guidebook.  Patient learns that weight gain occurs because we  consume more calories than we burn (eating more, moving less). Even if your body weight is normal, you may have higher ratios of fat compared to muscle mass. Too much body fat puts you at increased risk for cardiovascular disease, heart attack, stroke, type 2 diabetes, and obesity-related cancers. In addition to exercise, following the Pritikin Eating Plan can help reduce your risk.  Decoding Lab Results  Clinical staff conducted group or individual video education with verbal and written material and guidebook.  Patient learns that lab test reflects one measurement whose values change over time and are influenced by many factors, including medication, stress, sleep, exercise, food, hydration, pre-existing medical conditions, and more. It is recommended to use the knowledge from this video to become more involved with your lab results and evaluate your numbers to speak with your doctor.   Diseases of Our Time - Overview  Clinical staff conducted group or individual video education with verbal and written material and guidebook.  Patient learns that according to the CDC, 50% to 70% of chronic diseases (such as obesity, type 2 diabetes, elevated lipids, hypertension, and heart disease) are  avoidable through lifestyle improvements including healthier food choices, listening to satiety cues, and increased physical activity.  Sleep Disorders Clinical staff conducted group or individual video education with verbal and written material and guidebook.  Patient learns how good quality and duration of sleep are important to overall health and well-being. Patient also learns about sleep disorders and how they impact health along with recommendations to address them, including discussing with a physician.  Nutrition  Dining Out - Part 2 Clinical staff conducted group or individual video education with verbal and written material and guidebook.  Patient learns how to plan ahead and communicate in order to maximize their dining experience in a healthy and nutritious manner. Included are recommended food choices based on the type of restaurant the patient is visiting.   Fueling a Banker conducted group or individual video education with verbal and written material and guidebook.  There is a strong connection between our food choices and our health. Diseases like obesity and type 2 diabetes are very prevalent and are in large-part due to lifestyle choices. The Pritikin Eating Plan provides plenty of food and hunger-curbing satisfaction. It is easy to follow, affordable, and helps reduce health risks.  Menu Workshop  Clinical staff conducted group or individual video education with verbal and written material and guidebook.  Patient learns that restaurant meals can sabotage health goals because they are often packed with calories, fat, sodium, and sugar. Recommendations include strategies to plan ahead and to communicate with the manager, chef, or server to help order a healthier meal.  Planning Your Eating Strategy  Clinical staff conducted group or individual video education with verbal and written material and guidebook.  Patient learns about the Pritikin Eating Plan and  its benefit of reducing the risk of disease. The Pritikin Eating Plan does not focus on calories. Instead, it emphasizes high-quality, nutrient-rich foods. By knowing the characteristics of the foods, we choose, we can determine their calorie density and make informed decisions.  Targeting Your Nutrition Priorities  Clinical staff conducted group or individual video education with verbal and written material and guidebook.  Patient learns that lifestyle habits have a tremendous impact on disease risk and progression. This video provides eating and physical activity recommendations based on your personal health goals, such as reducing LDL cholesterol, losing weight, preventing or controlling type 2 diabetes, and  reducing high blood pressure.  Vitamins and Minerals  Clinical staff conducted group or individual video education with verbal and written material and guidebook.  Patient learns different ways to obtain key vitamins and minerals, including through a recommended healthy diet. It is important to discuss all supplements you take with your doctor.   Healthy Mind-Set    Smoking Cessation  Clinical staff conducted group or individual video education with verbal and written material and guidebook.  Patient learns that cigarette smoking and tobacco addiction pose a serious health risk which affects millions of people. Stopping smoking will significantly reduce the risk of heart disease, lung disease, and many forms of cancer. Recommended strategies for quitting are covered, including working with your doctor to develop a successful plan.  Culinary   Becoming a Set designer conducted group or individual video education with verbal and written material and guidebook.  Patient learns that cooking at home can be healthy, cost-effective, quick, and puts them in control. Keys to cooking healthy recipes will include looking at your recipe, assessing your equipment needs, planning ahead,  making it simple, choosing cost-effective seasonal ingredients, and limiting the use of added fats, salts, and sugars.  Cooking - Breakfast and Snacks  Clinical staff conducted group or individual video education with verbal and written material and guidebook.  Patient learns how important breakfast is to satiety and nutrition through the entire day. Recommendations include key foods to eat during breakfast to help stabilize blood sugar levels and to prevent overeating at meals later in the day. Planning ahead is also a key component.  Cooking - Educational psychologist conducted group or individual video education with verbal and written material and guidebook.  Patient learns eating strategies to improve overall health, including an approach to cook more at home. Recommendations include thinking of animal protein as a side on your plate rather than center stage and focusing instead on lower calorie dense options like vegetables, fruits, whole grains, and plant-based proteins, such as beans. Making sauces in large quantities to freeze for later and leaving the skin on your vegetables are also recommended to maximize your experience.  Cooking - Healthy Salads and Dressing Clinical staff conducted group or individual video education with verbal and written material and guidebook.  Patient learns that vegetables, fruits, whole grains, and legumes are the foundations of the Pritikin Eating Plan. Recommendations include how to incorporate each of these in flavorful and healthy salads, and how to create homemade salad dressings. Proper handling of ingredients is also covered. Cooking - Soups and [employer] - Soups and Desserts Clinical staff conducted group or individual video education with verbal and written material and guidebook.  Patient learns that Pritikin soups and desserts make for easy, nutritious, and delicious snacks and meal components that are low in sodium, fat, sugar, and  calorie density, while high in vitamins, minerals, and filling fiber. Recommendations include simple and healthy ideas for soups and desserts.   Overview     The Pritikin Solution Program Overview Clinical staff conducted group or individual video education with verbal and written material and guidebook.  Patient learns that the results of the Pritikin Program have been documented in more than 100 articles published in peer-reviewed journals, and the benefits include reducing risk factors for (and, in some cases, even reversing) high cholesterol, high blood pressure, type 2 diabetes, obesity, and more! An overview of the three key pillars of the Pritikin Program will be covered: eating  well, doing regular exercise, and having a healthy mind-set.  WORKSHOPS  Exercise: Exercise Basics: Building Your Action Plan Clinical staff led group instruction and group discussion with PowerPoint presentation and patient guidebook. To enhance the learning environment the use of posters, models and videos may be added. At the conclusion of this workshop, patients will comprehend the difference between physical activity and exercise, as well as the benefits of incorporating both, into their routine. Patients will understand the FITT (Frequency, Intensity, Time, and Type) principle and how to use it to build an exercise action plan. In addition, safety concerns and other considerations for exercise and cardiac rehab will be addressed by the presenter. The purpose of this lesson is to promote a comprehensive and effective weekly exercise routine in order to improve patients' overall level of fitness.   Managing Heart Disease: Your Path to a Healthier Heart Clinical staff led group instruction and group discussion with PowerPoint presentation and patient guidebook. To enhance the learning environment the use of posters, models and videos may be added.At the conclusion of this workshop, patients will understand the  anatomy and physiology of the heart. Additionally, they will understand how Pritikin's three pillars impact the risk factors, the progression, and the management of heart disease.  The purpose of this lesson is to provide a high-level overview of the heart, heart disease, and how the Pritikin lifestyle positively impacts risk factors.  Exercise Biomechanics Clinical staff led group instruction and group discussion with PowerPoint presentation and patient guidebook. To enhance the learning environment the use of posters, models and videos may be added. Patients will learn how the structural parts of their bodies function and how these functions impact their daily activities, movement, and exercise. Patients will learn how to promote a neutral spine, learn how to manage pain, and identify ways to improve their physical movement in order to promote healthy living. The purpose of this lesson is to expose patients to common physical limitations that impact physical activity. Participants will learn practical ways to adapt and manage aches and pains, and to minimize their effect on regular exercise. Patients will learn how to maintain good posture while sitting, walking, and lifting.  Balance Training and Fall Prevention  Clinical staff led group instruction and group discussion with PowerPoint presentation and patient guidebook. To enhance the learning environment the use of posters, models and videos may be added. At the conclusion of this workshop, patients will understand the importance of their sensorimotor skills (vision, proprioception, and the vestibular system) in maintaining their ability to balance as they age. Patients will apply a variety of balancing exercises that are appropriate for their current level of function. Patients will understand the common causes for poor balance, possible solutions to these problems, and ways to modify their physical environment in order to minimize their  fall risk. The purpose of this lesson is to teach patients about the importance of maintaining balance as they age and ways to minimize their risk of falling.  WORKSHOPS   Nutrition:  Fueling a Ship broker led group instruction and group discussion with PowerPoint presentation and patient guidebook. To enhance the learning environment the use of posters, models and videos may be added. Patients will review the foundational principles of the Pritikin Eating Plan and understand what constitutes a serving size in each of the food groups. Patients will also learn Pritikin-friendly foods that are better choices when away from home and review make-ahead meal and snack options. Calorie density will be reviewed  and applied to three nutrition priorities: weight maintenance, weight loss, and weight gain. The purpose of this lesson is to reinforce (in a group setting) the key concepts around what patients are recommended to eat and how to apply these guidelines when away from home by planning and selecting Pritikin-friendly options. Patients will understand how calorie density may be adjusted for different weight management goals.  Mindful Eating  Clinical staff led group instruction and group discussion with PowerPoint presentation and patient guidebook. To enhance the learning environment the use of posters, models and videos may be added. Patients will briefly review the concepts of the Pritikin Eating Plan and the importance of low-calorie dense foods. The concept of mindful eating will be introduced as well as the importance of paying attention to internal hunger signals. Triggers for non-hunger eating and techniques for dealing with triggers will be explored. The purpose of this lesson is to provide patients with the opportunity to review the basic principles of the Pritikin Eating Plan, discuss the value of eating mindfully and how to measure internal cues of hunger and fullness using the  Hunger Scale. Patients will also discuss reasons for non-hunger eating and learn strategies to use for controlling emotional eating.  Targeting Your Nutrition Priorities Clinical staff led group instruction and group discussion with PowerPoint presentation and patient guidebook. To enhance the learning environment the use of posters, models and videos may be added. Patients will learn how to determine their genetic susceptibility to disease by reviewing their family history. Patients will gain insight into the importance of diet as part of an overall healthy lifestyle in mitigating the impact of genetics and other environmental insults. The purpose of this lesson is to provide patients with the opportunity to assess their personal nutrition priorities by looking at their family history, their own health history and current risk factors. Patients will also be able to discuss ways of prioritizing and modifying the Pritikin Eating Plan for their highest risk areas  Menu  Clinical staff led group instruction and group discussion with PowerPoint presentation and patient guidebook. To enhance the learning environment the use of posters, models and videos may be added. Using menus brought in from [employer], or printed from [employer], patients will apply the Pritikin dining out guidelines that were presented in the [employer] video. Patients will also be able to practice these guidelines in a variety of provided scenarios. The purpose of this lesson is to provide patients with the opportunity to practice hands-on learning of the Pritikin Dining Out guidelines with actual menus and practice scenarios.  Label Reading Clinical staff led group instruction and group discussion with PowerPoint presentation and patient guidebook. To enhance the learning environment the use of posters, models and videos may be added. Patients will review and discuss the Pritikin label reading guidelines  presented in Pritikin's Label Reading Educational series video. Using fool labels brought in from local grocery stores and markets, patients will apply the label reading guidelines and determine if the packaged food meet the Pritikin guidelines. The purpose of this lesson is to provide patients with the opportunity to review, discuss, and practice hands-on learning of the Pritikin Label Reading guidelines with actual packaged food labels. Cooking School  Pritikin's [employer] are designed to teach patients ways to prepare quick, simple, and affordable recipes at home. The importance of nutrition's role in chronic disease risk reduction is reflected in its emphasis in the overall Pritikin program. By learning how to prepare essential  core Pritikin Eating Plan recipes, patients will increase control over what they eat; be able to customize the flavor of foods without the use of added salt, sugar, or fat; and improve the quality of the food they consume. By learning a set of core recipes which are easily assembled, quickly prepared, and affordable, patients are more likely to prepare more healthy foods at home. These workshops focus on convenient breakfasts, simple entres, side dishes, and desserts which can be prepared with minimal effort and are consistent with nutrition recommendations for cardiovascular risk reduction. Cooking [employer] are taught by a Armed forces logistics/support/administrative officer (RD) who has been trained by the [employer]. The chef or RD has a clear understanding of the importance of minimizing - if not completely eliminating - added fat, sugar, and sodium in recipes. Throughout the series of Cooking School Workshop sessions, patients will learn about healthy ingredients and efficient methods of cooking to build confidence in their capability to prepare    Cooking School weekly topics:  Adding Flavor- Sodium-Free  Fast and Healthy Breakfasts  Powerhouse Plant-Based  Proteins  Satisfying Salads and Dressings  Simple Sides and Sauces  International Cuisine-Spotlight on the [employer] Zones  Delicious Desserts  Savory Soups  [employer] - Meals in a Astronomer Appetizers and Snacks  Comforting Weekend Breakfasts  One-Pot Wonders   Fast Evening Meals  Landscape architect Your Pritikin Plate  WORKSHOPS   Healthy Mindset (Psychosocial):  Focused Goals, Sustainable Changes Clinical staff led group instruction and group discussion with PowerPoint presentation and patient guidebook. To enhance the learning environment the use of posters, models and videos may be added. Patients will be able to apply effective goal setting strategies to establish at least one personal goal, and then take consistent, meaningful action toward that goal. They will learn to identify common barriers to achieving personal goals and develop strategies to overcome them. Patients will also gain an understanding of how our mind-set can impact our ability to achieve goals and the importance of cultivating a positive and growth-oriented mind-set. The purpose of this lesson is to provide patients with a deeper understanding of how to set and achieve personal goals, as well as the tools and strategies needed to overcome common obstacles which may arise along the way.  From Head to Heart: The Power of a Healthy Outlook  Clinical staff led group instruction and group discussion with PowerPoint presentation and patient guidebook. To enhance the learning environment the use of posters, models and videos may be added. Patients will be able to recognize and describe the impact of emotions and mood on physical health. They will discover the importance of self-care and explore self-care practices which may work for them. Patients will also learn how to utilize the 4 C's to cultivate a healthier outlook and better manage stress and challenges. The purpose of this lesson is to demonstrate  to patients how a healthy outlook is an essential part of maintaining good health, especially as they continue their cardiac rehab journey.  Healthy Sleep for a Healthy Heart Clinical staff led group instruction and group discussion with PowerPoint presentation and patient guidebook. To enhance the learning environment the use of posters, models and videos may be added. At the conclusion of this workshop, patients will be able to demonstrate knowledge of the importance of sleep to overall health, well-being, and quality of life. They will understand the symptoms of, and treatments for, common sleep disorders. Patients will also be  able to identify daytime and nighttime behaviors which impact sleep, and they will be able to apply these tools to help manage sleep-related challenges. The purpose of this lesson is to provide patients with a general overview of sleep and outline the importance of quality sleep. Patients will learn about a few of the most common sleep disorders. Patients will also be introduced to the concept of "sleep hygiene," and discover ways to self-manage certain sleeping problems through simple daily behavior changes. Finally, the workshop will motivate patients by clarifying the links between quality sleep and their goals of heart-healthy living.   Recognizing and Reducing Stress Clinical staff led group instruction and group discussion with PowerPoint presentation and patient guidebook. To enhance the learning environment the use of posters, models and videos may be added. At the conclusion of this workshop, patients will be able to understand the types of stress reactions, differentiate between acute and chronic stress, and recognize the impact that chronic stress has on their health. They will also be able to apply different coping mechanisms, such as reframing negative self-talk. Patients will have the opportunity to practice a variety of stress management techniques, such as deep  abdominal breathing, progressive muscle relaxation, and/or guided imagery.  The purpose of this lesson is to educate patients on the role of stress in their lives and to provide healthy techniques for coping with it.  Learning Barriers/Preferences:  Learning Barriers/Preferences - 07/02/24 1028       Learning Barriers/Preferences   Learning Barriers None    Learning Preferences Audio;Computer/Internet;Group Instruction;Individual Instruction;Pictoral;Skilled Demonstration;Verbal Instruction;Video;Written Material          Education Topics:  Knowledge Questionnaire Score:  Knowledge Questionnaire Score - 07/02/24 1347       Knowledge Questionnaire Score   Pre Score 22/24          Core Components/Risk Factors/Patient Goals at Admission:  Personal Goals and Risk Factors at Admission - 07/02/24 1007       Core Components/Risk Factors/Patient Goals on Admission    Weight Management Yes;Obesity;Weight Loss    Intervention Weight Management/Obesity: Establish reasonable short term and long term weight goals.;Obesity: Provide education and appropriate resources to help participant work on and attain dietary goals.    Admit Weight 188 lb 15 oz (85.7 kg)    Goal Weight: Short Term 172 lb 12.8 oz (78.4 kg)    Goal Weight: Long Term 160 lb (72.6 kg)    Expected Outcomes Short Term: Continue to assess and modify interventions until short term weight is achieved;Long Term: Adherence to nutrition and physical activity/exercise program aimed toward attainment of established weight goal;Weight Loss: Understanding of general recommendations for a balanced deficit meal plan, which promotes 1-2 lb weight loss per week and includes a negative energy balance of [phone] kcal/d    Diabetes Yes    Intervention Provide education about signs/symptoms and action to take for hypo/hyperglycemia.;Provide education about proper nutrition, including hydration, and aerobic/resistive exercise prescription along  with prescribed medications to achieve blood glucose in normal ranges: Fasting glucose 65-99 mg/dL    Expected Outcomes Short Term: Participant verbalizes understanding of the signs/symptoms and immediate care of hyper/hypoglycemia, proper foot care and importance of medication, aerobic/resistive exercise and nutrition plan for blood glucose control.;Long Term: Attainment of HbA1C < 7%.    Hypertension Yes    Intervention Provide education on lifestyle modifcations including regular physical activity/exercise, weight management, moderate sodium restriction and increased consumption of fresh fruit, vegetables, and low fat dairy, alcohol moderation, and smoking  cessation.;Monitor prescription use compliance.    Expected Outcomes Short Term: Continued assessment and intervention until BP is < 140/52mm HG in hypertensive participants. < 130/28mm HG in hypertensive participants with diabetes, heart failure or chronic kidney disease.;Long Term: Maintenance of blood pressure at goal levels.    Lipids Yes    Intervention Provide education and support for participant on nutrition & aerobic/resistive exercise along with prescribed medications to achieve LDL 70mg , HDL >40mg .    Expected Outcomes Short Term: Participant states understanding of desired cholesterol values and is compliant with medications prescribed. Participant is following exercise prescription and nutrition guidelines.;Long Term: Cholesterol controlled with medications as prescribed, with individualized exercise RX and with personalized nutrition plan. Value goals: LDL < 70mg , HDL > 40 mg.          Core Components/Risk Factors/Patient Goals Review:    Core Components/Risk Factors/Patient Goals at Discharge (Final Review):    ITP Comments:  ITP Comments     Row Name 07/02/24 0902           ITP Comments Medical Director- Dr. Wilbert Bihari, MD. Introduction to the Pritikin Education/ Intensive Cardiac Rehab Program. Review of initial  orientation folder.          Comments: Chalice attended orientation for the cardiac rehabilitation program on  07/02/2024  to perform initial intake and exercise walk test. She was introduced to the [employer] education and orientation packet was reviewed. Completed 6-minute walk test, measurements, initial ITP, and exercise prescription. Blood pressure mildly elevated at rest. Telemetry-normal sinus rhythm, asymptomatic.   Service time was from 902 to 1112. Arnoldo CHRISTELLA Gal, MS, ACSM CEP 07/02/2024 [phone]

## 2024-07-02 NOTE — Telephone Encounter (Signed)
 We keep getting prior authorization requests from [facility] for jardiance  but jardiance  was already approved on 06/28/24. I called [facility] and Jardiance  does reject. I ran test claim and it rejected with us  as well. I called [phone] [employer] to ask about the approval that we received. They said the approval code had not been put in. Now it is in. I called [facility] back and asked them to fill this. Now 4.00

## 2024-07-02 NOTE — Progress Notes (Signed)
 Cardiac Rehab Medication Review   Does the patient  feel that his/her medications are working for him/her?  yes  Has the patient been experiencing any side effects to the medications prescribed?  no  Does the patient measure his/her own blood pressure or blood glucose at home?  no   Does the patient have any problems obtaining medications due to transportation or finances?   no  Understanding of regimen: excellent Understanding of indications: excellent Potential of compliance: excellent    Comments: Medication list reviewed and updated with patient   Arnoldo Gal 07/02/2024 10:20 AM

## 2024-07-03 ENCOUNTER — Other Ambulatory Visit: Payer: Self-pay

## 2024-07-03 NOTE — Patient Instructions (Signed)
 Visit Information  Thank you for taking time to visit with me today. Your 30 day transition of care goals have been met.  Please contact your provider is you have any further issues or concerns.   Following is a copy of your care plan:   Goals Addressed             This Visit's Progress    VBCI Transitions of Care (TOC) Care Plan       Problems:  Recent Hospitalization for treatment of Unstable angina/ NSTEMI Knowledge Deficit Related to angina/ NSTEMI, medication management and SDOH barrier: transportation  Goal:  Over the next 30 days, the patient will not experience hospital readmission  Interventions:  Transitions of Care: Assessed for chest pain, shortness of breath, dizziness/lightheadedness Assessed for signs of infection Reviewed upcoming provider visits Advised to keep follow up visits with provider.  Advised to call 911 for severe symptoms regarding SOB, chest pain, stroke- like symptoms Advised to call primary care provider for mild/ moderate symptoms/ concerns Medications reviewed and compliance discussed. Confirmed patient received her losartan  and rosuvastatin - patient reports receiving her losartan  and rosuvastatin  Encouraged patient to continue light exercise at least 30 minutes per day-  Patient reports next cardiac rehab visit is 07/10/24 Advised to continue following a low salt/ heart healthy/ low carb diet.  Advised patient to increase her water intake, consider eating prunes and drinking her coffee to help with constipation symptoms. Advised patient no relief to talk with her doctor and/ or pharmacist about an OTC medication.  Discussed and offered ongoing case management follow up with longitudinal nurse ( CCM) - Patient declined.     Patient Self Care Activities:  Attend all scheduled provider appointments Call pharmacy for medication refills 3-7 days in advance of running out of medications Call provider office for new concerns or questions  Notify RN  Care Manager of [facility] call rescheduling needs or for questions/ concerns Take medications as prescribed   Call 911 for severe symptoms such as shortness of breath, chest pain, stroke like symptoms. Continue to do light exercise at least 30 minutes daily increase your water intake, consider eating prunes and drinking her coffee to help with constipation symptoms. If no relief to talk with your doctor and/ or pharmacist about an OTC medication.  Call provider if groin pain persists or worsens.  Plan:  No further follow up required: 30 DAY TOC goals met.         Patient verbalizes understanding of instructions and care plan provided today and agrees to view in MyChart. Active MyChart status and patient understanding of how to access instructions and care plan via MyChart confirmed with patient.     The patient has been provided with contact information for the care management team and has been advised to call with any health related questions or concerns.  No further follow up required: patients Transition of care goals have been met.   Please call the care guide team at [phone] if you need to cancel or reschedule your appointment.   Please call the Suicide and Crisis Lifeline: 988 call the [place]  National Suicide Prevention Lifeline: 609-053-7816 or TTY: 671 556 0945 TTY 352-342-3506) to talk to a trained counselor call 1-800-273-TALK (toll free, 24 hour hotline) go to [facility] [address] [place] [phone]) if you are experiencing a Mental Health or Behavioral Health Crisis or need someone to talk to.  Arvin Seip RN, BSN, CCM [employer], [employer]  Health Case Manager Phone: [phone]

## 2024-07-03 NOTE — Transitions of Care (Post Inpatient/ED Visit) (Signed)
 Transition of Care week #5  Visit Note  07/03/2024  Name: Virginia [name] MRN: [id number]          DOB: 05-13-1961  Situation: Patient enrolled in [facility] 30-day program. Visit completed with patient by telephone.   Background: patient hospitalized from 05/26/24 to 05/28/24 for unstable angina. Had PCI on 05/27/24.      Past Medical History:  Diagnosis Date   Anemia    CAD (coronary artery disease)    a. 06/2015 BMS x2 to LCx. b. NSTEMI 11/2020 s/p DES to prox LAD; residual disease treated medically.   CKD (chronic kidney disease), stage II    Diabetes mellitus (HCC)    GERD (gastroesophageal reflux disease)    ?   GI bleeding    2016 - bleeding polyp clipped   Hyperlipidemia with target LDL less than 70    Hypertension    STEMI (ST elevation myocardial infarction) (HCC) 06/16/2015   BMS x 2 CFX   Thyroid  nodule    benign    Assessment: Patient Reported Symptoms: Cognitive Cognitive Status: No symptoms reported, Alert and oriented to person, place, and time, Insightful and able to interpret abstract concepts, Normal speech and language skills      Neurological Neurological Review of Symptoms: No symptoms reported    HEENT HEENT Symptoms Reported: No symptoms reported      Cardiovascular Cardiovascular Symptoms Reported: No symptoms reported    Respiratory Respiratory Symptoms Reported: No symptoms reported    Endocrine Endocrine Symptoms Reported: No symptoms reported    Gastrointestinal Gastrointestinal Symptoms Reported: No symptoms reported      Genitourinary Genitourinary Symptoms Reported: No symptoms reported    Integumentary Integumentary Symptoms Reported: Other Other Integumentary Symptoms: patient reports groin has healed well and denies any further groin pain.    Musculoskeletal Musculoskelatal Symptoms Reviewed: No symptoms reported        Psychosocial Psychosocial Symptoms Reported: No symptoms reported         There were no vitals filed for this  visit.  Medications Reviewed Today     Reviewed by Enriqueta Augusta E, RN (Registered Nurse) on 07/03/24 at 1119  Med List Status: <None>   Medication Order Taking? Sig Documenting Provider Last Dose Status Informant  acetaminophen  (TYLENOL ) 500 MG tablet [id number] Yes Take 1 tablet (500 mg total) by mouth every 6 (six) hours as needed. Tilford Bertram HERO, FNP  Active Self, Pharmacy Records  amLODipine  (NORVASC ) 5 MG tablet [id number] Yes Take 1 tablet (5 mg total) by mouth daily. Paseda, Folashade R, FNP  Active   aspirin  EC 81 MG tablet [id number] Yes Take 1 tablet (81 mg total) by mouth daily. Stroud, Natalie M, FNP  Active Self, Pharmacy Records  empagliflozin  (JARDIANCE ) 10 MG TABS tablet [id number] Yes Take 1 tablet (10 mg total) by mouth daily before breakfast. Ladona Heinz, MD  Active   Evolocumab  (REPATHA  SURECLICK) 140 MG/ML SOAJ [id number] Yes Inject 140 mg into the skin every 14 (fourteen) days. Ladona Heinz, MD  Active   ezetimibe  (ZETIA ) 10 MG tablet [id number]  Take 1 tablet (10 mg total) by mouth at bedtime.  Patient not taking: Reported on 07/02/2024   Ladona Heinz, MD  Active     Discontinued 11/26/20 1412 (Stop Taking at Discharge) losartan  (COZAAR ) 50 MG tablet [id number] Yes Take 1 tablet (50 mg total) by mouth daily. Paseda, Folashade R, FNP  Active   metoprolol  succinate (TOPROL -XL) 25 MG 24 hr tablet [id number] Yes Take 1 tablet (25 mg total)  by mouth daily. Ladona Heinz, MD  Active     Discontinued 11/26/20 1412 (Stop Taking at Discharge) nitroGLYCERIN  (NITROSTAT ) 0.4 MG SL tablet [id number] Yes Place 1 tablet (0.4 mg total) under the tongue every 5 (five) minutes as needed for chest pain. Ladona Heinz, MD  Active   pantoprazole  (PROTONIX ) 40 MG tablet [id number] Yes Take 1 tablet (40 mg total) by mouth daily. Maccia, Melissa D, RPH-CPP  Active   rosuvastatin  (CRESTOR ) 40 MG tablet [id number] Yes Take 1 tablet (40 mg total) by mouth daily. Paseda, Folashade R, FNP  Active   ticagrelor  (BRILINTA )  90 MG TABS tablet [id number] Yes Take 1 tablet (90 mg total) by mouth 2 (two) times daily. Ladona Heinz, MD  Active             Goals Addressed             This Visit's Progress    VBCI Transitions of Care (TOC) Care Plan       Problems:  Recent Hospitalization for treatment of Unstable angina/ NSTEMI Knowledge Deficit Related to angina/ NSTEMI, medication management and SDOH barrier: transportation  Goal:  Over the next 30 days, the patient will not experience hospital readmission  Interventions:  Transitions of Care: Assessed for chest pain, shortness of breath, dizziness/lightheadedness Assessed for signs of infection Reviewed upcoming provider visits Advised to keep follow up visits with provider.  Advised to call 911 for severe symptoms regarding SOB, chest pain, stroke- like symptoms Advised to call primary care provider for mild/ moderate symptoms/ concerns Medications reviewed and compliance discussed. Confirmed patient received her losartan  and rosuvastatin - patient reports receiving her losartan  and rosuvastatin  Encouraged patient to continue light exercise at least 30 minutes per day-  Patient reports next cardiac rehab visit is 07/10/24 Advised to continue following a low salt/ heart healthy/ low carb diet.  Advised patient to increase her water intake, consider eating prunes and drinking her coffee to help with constipation symptoms. Advised patient no relief to talk with her doctor and/ or pharmacist about an OTC medication.  Discussed and offered ongoing case management follow up with longitudinal nurse ( CCM) - Patient declined.     Patient Self Care Activities:  Attend all scheduled provider appointments Call pharmacy for medication refills 3-7 days in advance of running out of medications Call provider office for new concerns or questions  Notify RN Care Manager of [facility] call rescheduling needs or for questions/ concerns Take medications as prescribed   Call 911  for severe symptoms such as shortness of breath, chest pain, stroke like symptoms. Continue to do light exercise at least 30 minutes daily increase your water intake, consider eating prunes and drinking her coffee to help with constipation symptoms. If no relief to talk with your doctor and/ or pharmacist about an OTC medication.  Call provider if groin pain persists or worsens.  Plan:  No further follow up required: 30 DAY TOC goals met.          Recommendation:   Ongoing follow up with primary care provider and continue plan of care with providers.   Follow Up Plan:   Closing From:  Transitions of Care Program Goals have been met.   Arvin Seip RN, BSN, CCM [employer], Population Health Case Manager Phone: [phone]

## 2024-07-04 ENCOUNTER — Telehealth: Payer: Self-pay

## 2024-07-04 NOTE — Progress Notes (Signed)
   Telephone encounter was:  Successful.  Complex Care Management Note Care Guide Note  07/04/2024 Name: Virginia [name] MRN: [id number] DOB: 1961/09/30  Virginia [name] is a 63 y.o. year old female who is a primary care patient of Paseda, Folashade R, FNP . The community resource team was consulted for assistance with Transportation Needs   SDOH screenings and interventions completed:  Yes  Social Drivers of Health From This Encounter   Transportation Needs: Unmet Transportation Needs (07/04/2024)   PRAPARE - Administrator, Civil Service (Medical): Yes    Lack of Transportation (Non-Medical): Yes    SDOH Interventions Today    Flowsheet Row Most Recent Value  SDOH Interventions   Transportation Interventions Community Resources Provided     Care guide performed the following interventions: Patient provided with information about care guide support team and interviewed to confirm resource needs.Returning a incoming call from Pt. I did give [employer] over the phone. Pt has medicaid and will call about transportation benefits as well. Pt reqiested I mail the resources as well   Follow Up Plan:  No further follow up planned at this time. The patient has been provided with needed resources.  Encounter Outcome:  Patient Visit Completed    Jon Colt [facility]  [facility] Guide, Phone: [phone] Fax: [phone] Website: [email or web address]

## 2024-07-10 ENCOUNTER — Encounter (HOSPITAL_COMMUNITY)
Admission: RE | Admit: 2024-07-10 | Discharge: 2024-07-10 | Disposition: A | Discharge status: Home / Self Care | Source: Ambulatory Visit | Attending: Cardiology | Admitting: Cardiology

## 2024-07-10 DIAGNOSIS — Z48812 Encounter for surgical aftercare following surgery on the circulatory system: Secondary | ICD-10-CM | POA: Diagnosis present

## 2024-07-10 DIAGNOSIS — Z955 Presence of coronary angioplasty implant and graft: Secondary | ICD-10-CM | POA: Insufficient documentation

## 2024-07-10 NOTE — Progress Notes (Signed)
 Daily Session Note  Patient Details  Name: Virginia [name] MRN: [id number] Date of Birth: [birth year] Referring Provider:   Flowsheet Row CARDIAC REHAB PHASE II ORIENTATION from 07/02/2024 in [facility]  Referring Provider Virginia Heinz, MD    Encounter Date: 07/10/2024  Check In:  Session Check In - 07/10/24 9076       Check-In   Supervising physician immediately available to respond to emergencies [facility] MD immediately available    Physician(s) Virginia Beauvais, NP    Location [facility]    Staff Present Virginia Gal, MS, ACSM-CEP, Exercise Physiologist;Virginia Fayette, MS, Exercise Physiologist;Virginia Rakers, RN, Virginia Music, RN, Virginia Parkins, MS, ACSM-CEP, CCRP, Exercise Physiologist    Virtual Visit No    Medication changes reported     No    Fall or balance concerns reported    No    Tobacco Cessation No Change    Warm-up and Cool-down Performed as group-led instruction   Cardiac Rehab Orientation   Resistance Training Performed No    VAD Patient? No    PAD/SET Patient? No      Pain Assessment   Currently in Pain? No/denies    Pain Score 0-No pain    Multiple Pain Sites No          Capillary Blood Glucose: No results found for this or any previous visit (from the past 24 hours).   Exercise Prescription Changes - 07/10/24 1400       Response to Exercise   Blood Pressure (Admit) 118/80    Blood Pressure (Exercise) 140/72    Blood Pressure (Exit) 108/68    Heart Rate (Admit) 83 bpm    Heart Rate (Exercise) 118 bpm    Heart Rate (Exit) 91 bpm    Rating of Perceived Exertion (Exercise) 13    Symptoms None    Comments Pt's first day in the CRP2 program    Duration Continue with 30 min of aerobic exercise without signs/symptoms of physical distress.    Intensity THRR unchanged      Progression   Progression Continue to progress workloads to maintain intensity without signs/symptoms  of physical distress.    Average METs 2.2      Resistance Training   Training Prescription No    Weight No wts on wednesdays      Interval Training   Interval Training No      Treadmill   MPH 1.8    Grade 0    Minutes 15    METs 2.38      Recumbant Elliptical   Level 1    RPM 43    Watts 47    Minutes 15    METs 2          Social History   Tobacco Use  Smoking Status Never  Smokeless Tobacco Never    Goals Met:  Exercise tolerated well No report of concerns or symptoms today  Goals Unmet:  Not Applicable  Comments: Virginia [name] started cardiac rehab today.  Pt tolerated light exercise without difficulty. VSS, telemetry-Sinus Rhythm, asymptomatic.  Medication list reconciled. Pt denies barriers to medicaiton compliance.  PSYCHOSOCIAL ASSESSMENT:  PHQ-2. Pt exhibits positive coping skills, hopeful outlook with supportive family. No psychosocial needs identified at this time, no psychosocial interventions necessary.    Pt enjoys sewing and ancestral research.   Pt oriented to exercise equipment and routine.    Understanding verbalized.Virginia Elpidio Quan RN BSN  Dr. Wilbert [name] is Medical Director for Cardiac Rehab at [facility].

## 2024-07-11 LAB — GLUCOSE, CAPILLARY
Glucose-Capillary: 130 mg/dL — ABNORMAL HIGH (ref 70–99)
Glucose-Capillary: 100 mg/dL — ABNORMAL HIGH (ref 70–99)

## 2024-07-11 NOTE — Progress Notes (Signed)
 Cardiac Individual Treatment Plan  Patient Details  Name: Virginia [name] MRN: [id number] Date of Birth: 1960/11/12 Referring Provider:   Flowsheet Row CARDIAC REHAB PHASE II ORIENTATION from 07/02/2024 in [facility]  Referring Provider Ladona Heinz, MD    Initial Encounter Date:  Flowsheet Row CARDIAC REHAB PHASE II ORIENTATION from 07/02/2024 in [facility]  Date 07/02/24    Visit Diagnosis: 05/30/24 DES LAD x2, LCx  Patient's Home Medications on Admission:  Current Outpatient Medications:    acetaminophen  (TYLENOL ) 500 MG tablet, Take 1 tablet (500 mg total) by mouth every 6 (six) hours as needed., Disp: 30 tablet, Rfl: 0   amLODipine  (NORVASC ) 5 MG tablet, Take 1 tablet (5 mg total) by mouth daily., Disp: 90 tablet, Rfl: 1   aspirin  EC 81 MG tablet, Take 1 tablet (81 mg total) by mouth daily., Disp: 30 tablet, Rfl: 6   empagliflozin  (JARDIANCE ) 10 MG TABS tablet, Take 1 tablet (10 mg total) by mouth daily before breakfast., Disp: 90 tablet, Rfl: 3   Evolocumab  (REPATHA  SURECLICK) 140 MG/ML SOAJ, Inject 140 mg into the skin every 14 (fourteen) days., Disp: 6 mL, Rfl: 3   ezetimibe  (ZETIA ) 10 MG tablet, Take 1 tablet (10 mg total) by mouth at bedtime. (Patient not taking: Reported on 07/02/2024), Disp: 30 tablet, Rfl: 1   losartan  (COZAAR ) 50 MG tablet, Take 1 tablet (50 mg total) by mouth daily., Disp: 90 tablet, Rfl: 1   metoprolol  succinate (TOPROL -XL) 25 MG 24 hr tablet, Take 1 tablet (25 mg total) by mouth daily., Disp: 90 tablet, Rfl: 3   nitroGLYCERIN  (NITROSTAT ) 0.4 MG SL tablet, Place 1 tablet (0.4 mg total) under the tongue every 5 (five) minutes as needed for chest pain., Disp: 25 tablet, Rfl: 3   pantoprazole  (PROTONIX ) 40 MG tablet, Take 1 tablet (40 mg total) by mouth daily., Disp: 90 tablet, Rfl: 3   rosuvastatin  (CRESTOR ) 40 MG tablet, Take 1 tablet (40 mg  total) by mouth daily., Disp: 90 tablet, Rfl: 1   ticagrelor  (BRILINTA ) 90 MG TABS tablet, Take 1 tablet (90 mg total) by mouth 2 (two) times daily., Disp: 180 tablet, Rfl: 3  Past Medical History: Past Medical History:  Diagnosis Date   Anemia    CAD (coronary artery disease)    a. 06/2015 BMS x2 to LCx. b. NSTEMI 11/2020 s/p DES to prox LAD; residual disease treated medically.   CKD (chronic kidney disease), stage II    Diabetes mellitus (HCC)    GERD (gastroesophageal reflux disease)    ?   GI bleeding    2016 - bleeding polyp clipped   Hyperlipidemia with target LDL less than 70    Hypertension    STEMI (ST elevation myocardial infarction) (HCC) 06/16/2015   BMS x 2 CFX   Thyroid  nodule    benign    Tobacco Use: Social History   Tobacco Use  Smoking Status Never  Smokeless Tobacco Never    Labs: Review Flowsheet  More data exists      Latest Ref Rng & Units 04/27/2021 08/03/2021 02/07/2023 05/26/2024 05/27/2024  Labs for ITP Cardiac and Pulmonary Rehab  Cholestrol 0 - 200 mg/dL - - - - 876   LDL (calc) 0 - 99 mg/dL - - - - 56   HDL-C >59 mg/dL - - - - 46   Trlycerides <150 mg/dL - - - - 894  Hemoglobin A1c 4.8 - 5.6 % 6.0  6.0  6.0  6.0  6.3  6.3  6.3  6.3  6.4  6.6  6.7  -    Details       Multiple values from one day are sorted in reverse-chronological order         Capillary Blood Glucose: Lab Results  Component Value Date   GLUCAP 91 07/12/2024   GLUCAP 86 07/12/2024   GLUCAP 100 (H) 07/10/2024   GLUCAP 130 (H) 07/10/2024   GLUCAP 104 (H) 05/27/2024     Exercise Target Goals: Exercise Program Goal: Individual exercise prescription set using results from initial 6 min walk test and THRR while considering  patient's activity barriers and safety.   Exercise Prescription Goal: Initial exercise prescription builds to 30-45 minutes a day of aerobic activity, 2-3 days per week.  Home exercise guidelines will be given to patient during program as part of  exercise prescription that the participant will acknowledge.  Activity Barriers & Risk Stratification:  Activity Barriers & Cardiac Risk Stratification - 07/02/24 1007       Activity Barriers & Cardiac Risk Stratification   Activity Barriers None    Cardiac Risk Stratification Moderate          6 Minute Walk:  6 Minute Walk     Row Name 07/02/24 1052         6 Minute Walk   Phase Initial     Distance 1464 feet     Walk Time 6 minutes     # of Rest Breaks 0     MPH 2.77     METS 3.62     RPE 13     Perceived Dyspnea  1     VO2 Peak 12.66     Symptoms Yes (comment)     Comments Mild shortness of breath. Left calf cramp/ pain 8/10 on pain scale. Resolved with rest/ stretching.     Resting HR 77 bpm     Resting BP 158/72     Resting Oxygen Saturation  100 %     Exercise Oxygen Saturation  during 6 min walk 100 %     Max Ex. HR 105 bpm     Max Ex. BP 160/76     2 Minute Post BP 138/72        Oxygen Initial Assessment:   Oxygen Re-Evaluation:   Oxygen Discharge (Final Oxygen Re-Evaluation):   Initial Exercise Prescription:  Initial Exercise Prescription - 07/02/24 1200       Date of Initial Exercise RX and Referring Provider   Date 07/02/24    Referring Provider Ladona Heinz, MD    Expected Discharge Date 10/04/24      Treadmill   MPH 2    Grade 0    Minutes 15    METs 2.53      Recumbant Elliptical   Level 2    Minutes 15    METs 2.5      Prescription Details   Frequency (times per week) 3    Duration Progress to 30 minutes of continuous aerobic without signs/symptoms of physical distress      Intensity   THRR 40-80% of Max Heartrate 62-125    Ratings of Perceived Exertion 11-13    Perceived Dyspnea 0-4      Progression   Progression Continue to progress workloads to maintain intensity without signs/symptoms of physical distress.      Paramedic Prescription  Yes    Weight 2 lbs    Reps 10-15          Perform  Capillary Blood Glucose checks as needed.  Exercise Prescription Changes:   Exercise Prescription Changes     Row Name 07/10/24 1400             Response to Exercise   Blood Pressure (Admit) 118/80       Blood Pressure (Exercise) 140/72       Blood Pressure (Exit) 108/68       Heart Rate (Admit) 83 bpm       Heart Rate (Exercise) 118 bpm       Heart Rate (Exit) 91 bpm       Rating of Perceived Exertion (Exercise) 13       Symptoms None       Comments Pt's first day in the CRP2 program       Duration Continue with 30 min of aerobic exercise without signs/symptoms of physical distress.       Intensity THRR unchanged         Progression   Progression Continue to progress workloads to maintain intensity without signs/symptoms of physical distress.       Average METs 2.2         Resistance Training   Training Prescription No       Weight No wts on wednesdays         Interval Training   Interval Training No         Treadmill   MPH 1.8       Grade 0       Minutes 15       METs 2.38         Recumbant Elliptical   Level 1       RPM 43       Watts 47       Minutes 15       METs 2          Exercise Comments:   Exercise Comments     Row Name 07/10/24 1143           Exercise Comments Pt's first day in the CRP2 program. Pt exercised without complaints and is off to a good start.          Exercise Goals and Review:   Exercise Goals     Row Name 07/02/24 1007             Exercise Goals   Increase Physical Activity Yes       Intervention Provide advice, education, support and counseling about physical activity/exercise needs.;Develop an individualized exercise prescription for aerobic and resistive training based on initial evaluation findings, risk stratification, comorbidities and participant's personal goals.       Expected Outcomes Short Term: Attend rehab on a regular basis to increase amount of physical activity.;Long Term: Exercising regularly at  least 3-5 days a week.;Long Term: Add in home exercise to make exercise part of routine and to increase amount of physical activity.       Increase Strength and Stamina Yes       Intervention Provide advice, education, support and counseling about physical activity/exercise needs.;Develop an individualized exercise prescription for aerobic and resistive training based on initial evaluation findings, risk stratification, comorbidities and participant's personal goals.       Expected Outcomes Short Term: Increase workloads from initial exercise prescription for resistance, speed, and METs.;Short Term: Perform resistance training exercises  routinely during rehab and add in resistance training at home;Long Term: Improve cardiorespiratory fitness, muscular endurance and strength as measured by increased METs and functional capacity ( )       Able to understand and use rate of perceived exertion (RPE) scale Yes       Intervention Provide education and explanation on how to use RPE scale       Expected Outcomes Short Term: Able to use RPE daily in rehab to express subjective intensity level;Long Term:  Able to use RPE to guide intensity level when exercising independently       Knowledge and understanding of Target Heart Rate Range (THRR) Yes       Intervention Provide education and explanation of THRR including how the numbers were predicted and where they are located for reference       Expected Outcomes Short Term: Able to state/look up THRR;Short Term: Able to use daily as guideline for intensity in rehab;Long Term: Able to use THRR to govern intensity when exercising independently       Able to check pulse independently Yes       Intervention Provide education and demonstration on how to check pulse in carotid and radial arteries.;Review the importance of being able to check your own pulse for safety during independent exercise       Expected Outcomes Short Term: Able to explain why pulse checking is  important during independent exercise;Long Term: Able to check pulse independently and accurately       Understanding of Exercise Prescription Yes       Intervention Provide education, explanation, and written materials on patient's individual exercise prescription       Expected Outcomes Short Term: Able to explain program exercise prescription;Long Term: Able to explain home exercise prescription to exercise independently          Exercise Goals Re-Evaluation :  Exercise Goals Re-Evaluation     Row Name 07/10/24 1142             Exercise Goal Re-Evaluation   Exercise Goals Review Increase Physical Activity;Increase Strength and Stamina;Able to understand and use rate of perceived exertion (RPE) scale;Knowledge and understanding of Target Heart Rate Range (THRR);Understanding of Exercise Prescription       Comments Pt's first day in the CRP2 program. Pt understands the exercise Rx, RPE scale and THRR.       Expected Outcomes Will continue to monitor patient and progress exercise workloads as tolerated.          Discharge Exercise Prescription (Final Exercise Prescription Changes):  Exercise Prescription Changes - 07/10/24 1400       Response to Exercise   Blood Pressure (Admit) 118/80    Blood Pressure (Exercise) 140/72    Blood Pressure (Exit) 108/68    Heart Rate (Admit) 83 bpm    Heart Rate (Exercise) 118 bpm    Heart Rate (Exit) 91 bpm    Rating of Perceived Exertion (Exercise) 13    Symptoms None    Comments Pt's first day in the CRP2 program    Duration Continue with 30 min of aerobic exercise without signs/symptoms of physical distress.    Intensity THRR unchanged      Progression   Progression Continue to progress workloads to maintain intensity without signs/symptoms of physical distress.    Average METs 2.2      Resistance Training   Training Prescription No    Weight No wts on wednesdays      Interval Training  Interval Training No      Treadmill   MPH  1.8    Grade 0    Minutes 15    METs 2.38      Recumbant Elliptical   Level 1    RPM 43    Watts 47    Minutes 15    METs 2          Nutrition:  Target Goals: Understanding of nutrition guidelines, daily intake of sodium 1500mg , cholesterol 200mg , calories 30% from fat and 7% or less from saturated fats, daily to have 5 or more servings of fruits and vegetables.  Biometrics:  Pre Biometrics - 07/02/24 0902       Pre Biometrics   Waist Circumference 36.75 inches    Hip Circumference 44 inches    Waist to Hip Ratio 0.84 %    Triceps Skinfold 41 mm    % Body Fat 42.3 %    Grip Strength 18 kg    Flexibility 14 in    Single Leg Stand 26.06 seconds           Nutrition Therapy Plan and Nutrition Goals:   Nutrition Assessments:  MEDIFICTS Score Key: >=70 Need to make dietary changes  40-70 Heart Healthy Diet <= 40 Therapeutic Level Cholesterol Diet    Picture Your Plate Scores: <59 Unhealthy dietary pattern with much room for improvement. 41-50 Dietary pattern unlikely to meet recommendations for good health and room for improvement. 51-60 More healthful dietary pattern, with some room for improvement.  >60 Healthy dietary pattern, although there may be some specific behaviors that could be improved.    Nutrition Goals Re-Evaluation:   Nutrition Goals Re-Evaluation:   Nutrition Goals Discharge (Final Nutrition Goals Re-Evaluation):   Psychosocial: Target Goals: Acknowledge presence or absence of significant depression and/or stress, maximize coping skills, provide positive support system. Participant is able to verbalize types and ability to use techniques and skills needed for reducing stress and depression.  Initial Review & Psychosocial Screening:  Initial Psych Review & Screening - 07/02/24 1027       Initial Review   Current issues with None Identified      Family Dynamics   Good Support System? Yes    Comments Support from husband.       Barriers   Psychosocial barriers to participate in program There are no identifiable barriers or psychosocial needs.      Screening Interventions   Interventions Encouraged to exercise;Provide feedback about the scores to participant    Expected Outcomes Short Term goal: Identification and review with participant of any Quality of Life or Depression concerns found by scoring the questionnaire.;Long Term goal: The participant improves quality of Life and PHQ9 Scores as seen by post scores and/or verbalization of changes          Quality of Life Scores:  Quality of Life - 07/02/24 1346       Quality of Life   Select Quality of Life      Quality of Life Scores   Health/Function Pre 19.43 %    Socioeconomic Pre 23.25 %    Psych/Spiritual Pre 22.29 %    Family Pre 30 %    GLOBAL Pre 22.39 %         Scores of 19 and below usually indicate a poorer quality of life in these areas.  A difference of  2-3 points is a clinically meaningful difference.  A difference of 2-3 points in the total score of the  Quality of Life Index has been associated with significant improvement in overall quality of life, self-image, physical symptoms, and general health in studies assessing change in quality of life.  PHQ-9: Review Flowsheet  More data exists      07/02/2024 06/07/2024 08/03/2021 10/06/2020 04/03/2020  Depression screen PHQ 2/9  Decreased Interest 0 0 0 0 0  Down, Depressed, Hopeless 0 0 0 0 0  PHQ - 2 Score 0 0 0 0 0  Altered sleeping 1 0 - - -  Tired, decreased energy 1 3 - - -  Change in appetite 0 0 - - -  Feeling bad or failure about yourself  0 0 - - -  Trouble concentrating 0 0 - - -  Moving slowly or fidgety/restless 0 3 - - -  Suicidal thoughts 0 0 - - -  PHQ-9 Score 2 6 - - -  Difficult doing work/chores Somewhat difficult Not difficult at all - - -   Interpretation of Total Score  Total Score Depression Severity:  1-4 = Minimal depression, 5-9 = Mild depression, 10-14 =  Moderate depression, 15-19 = Moderately severe depression, 20-27 = Severe depression   Psychosocial Evaluation and Intervention:   Psychosocial Re-Evaluation:  Psychosocial Re-Evaluation     Row Name 07/10/24 1651             Psychosocial Re-Evaluation   Current issues with None Identified       Interventions Encouraged to attend Cardiac Rehabilitation for the exercise       Continue Psychosocial Services  No Follow up required          Psychosocial Discharge (Final Psychosocial Re-Evaluation):  Psychosocial Re-Evaluation - 07/10/24 1651       Psychosocial Re-Evaluation   Current issues with None Identified    Interventions Encouraged to attend Cardiac Rehabilitation for the exercise    Continue Psychosocial Services  No Follow up required          Vocational Rehabilitation: Provide vocational rehab assistance to qualifying candidates.   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          Education: Education Goals: Education classes will be provided on a weekly basis, covering required topics. Participant will state understanding/return demonstration of topics presented.    Education     Row Name 07/10/24 0900     Education   Cardiac Education Topics Pritikin   Secondary school teacher School   Educator Nurse   Weekly Topic One-Pot Wonders   Instruction Review Code 1- Verbalizes Understanding   Class Start Time 0815   Class Stop Time [phone]   Class Time Calculation (min) 40 min    Row Name 07/12/24 0900     Education   Cardiac Education Topics Pritikin   Select Core Videos     Core Videos   Educator Exercise Physiologist   Select General Education   General Education Hypertension and  Heart Disease   Instruction Review Code 1- Verbalizes Understanding   Class Start Time 0805   Class Stop Time 0845   Class Time Calculation (min) 40 min    Row Name 07/15/24 0800     Education   Cardiac Education Topics Pritikin   Select  Workshops     Workshops   Biomedical scientist Psychosocial   Psychosocial Workshop Focused Goals, Sustainable Changes   Instruction Review Code 1- Verbalizes Understanding   Class Start Time 0815   Class Stop Time 0850   Class Time Calculation (min) 35 min      Core Videos: Exercise    Move It!  Clinical staff conducted group or individual video education with verbal and written material and guidebook.  Patient learns the recommended Pritikin exercise program. Exercise with the goal of living a long, healthy life. Some of the health benefits of exercise include controlled diabetes, healthier blood pressure levels, improved cholesterol levels, improved heart and lung capacity, improved sleep, and better body composition. Everyone should speak with their doctor before starting or changing an exercise routine.  Biomechanical Limitations Clinical staff conducted group or individual video education with verbal and written material and guidebook.  Patient learns how biomechanical limitations can impact exercise and how we can mitigate and possibly overcome limitations to have an impactful and balanced exercise routine.  Body Composition Clinical staff conducted group or individual video education with verbal and written material and guidebook.  Patient learns that body composition (ratio of muscle mass to fat mass) is a key component to assessing overall fitness, rather than body weight alone. Increased fat mass, especially visceral belly fat, can put us  at increased risk for metabolic syndrome, type 2 diabetes, heart disease, and even death. It is recommended to combine diet and exercise (cardiovascular and resistance training) to  improve your body composition. Seek guidance from your physician and exercise physiologist before implementing an exercise routine.  Exercise Action Plan Clinical staff conducted group or individual video education with verbal and written material and guidebook.  Patient learns the recommended strategies to achieve and enjoy long-term exercise adherence, including variety, self-motivation, self-efficacy, and positive decision making. Benefits of exercise include fitness, good health, weight management, more energy, better sleep, less stress, and overall well-being.  Medical   Heart Disease Risk Reduction Clinical staff conducted group or individual video education with verbal and written material and guidebook.  Patient learns our heart is our most vital organ as it circulates oxygen, nutrients, white blood cells, and hormones throughout the entire body, and carries waste away. Data supports a plant-based eating plan like the Pritikin Program for its effectiveness in slowing progression of and reversing heart disease. The video provides a number of recommendations to address heart disease.   Metabolic Syndrome and Belly Fat  Clinical staff conducted group or individual video education with verbal and written material and guidebook.  Patient learns what metabolic syndrome is, how it leads to heart disease, and how one can reverse it and keep it from coming back. You have metabolic syndrome if you have 3 of the following 5 criteria: abdominal obesity, high blood pressure, high triglycerides, low HDL cholesterol, and high blood sugar.  Hypertension and Heart Disease Clinical staff conducted group or individual video education with verbal and written material and guidebook.  Patient learns that high blood pressure, or hypertension, is very common in the [place] . Hypertension is largely due to excessive salt intake, but other important risk factors include being overweight, physical inactivity,  drinking too much alcohol, smoking, and not eating enough potassium from fruits and vegetables. High blood pressure is a leading risk factor for heart attack, stroke, congestive heart failure, dementia, kidney failure, and premature death. Long-term effects of excessive salt intake include stiffening of the arteries and  thickening of heart muscle and organ damage. Recommendations include ways to reduce hypertension and the risk of heart disease.  Diseases of Our Time - Focusing on Diabetes Clinical staff conducted group or individual video education with verbal and written material and guidebook.  Patient learns why the best way to stop diseases of our time is prevention, through food and other lifestyle changes. Medicine (such as prescription pills and surgeries) is often only a Band-Aid on the problem, not a long-term solution. Most common diseases of our time include obesity, type 2 diabetes, hypertension, heart disease, and cancer. The Pritikin Program is recommended and has been proven to help reduce, reverse, and/or prevent the damaging effects of metabolic syndrome.  Nutrition   Overview of the Pritikin Eating Plan  Clinical staff conducted group or individual video education with verbal and written material and guidebook.  Patient learns about the Pritikin Eating Plan for disease risk reduction. The Pritikin Eating Plan emphasizes a wide variety of unrefined, minimally-processed carbohydrates, like fruits, vegetables, whole grains, and legumes. Go, Caution, and Stop food choices are explained. Plant-based and lean animal proteins are emphasized. Rationale provided for low sodium intake for blood pressure control, low added sugars for blood sugar stabilization, and low added fats and oils for coronary artery disease risk reduction and weight management.  Calorie Density  Clinical staff conducted group or individual video education with verbal and written material and guidebook.  Patient learns  about calorie density and how it impacts the Pritikin Eating Plan. Knowing the characteristics of the food you choose will help you decide whether those foods will lead to weight gain or weight loss, and whether you want to consume more or less of them. Weight loss is usually a side effect of the Pritikin Eating Plan because of its focus on low calorie-dense foods.  Label Reading  Clinical staff conducted group or individual video education with verbal and written material and guidebook.  Patient learns about the Pritikin recommended label reading guidelines and corresponding recommendations regarding calorie density, added sugars, sodium content, and whole grains.  Dining Out - Part 1  Clinical staff conducted group or individual video education with verbal and written material and guidebook.  Patient learns that restaurant meals can be sabotaging because they can be so high in calories, fat, sodium, and/or sugar. Patient learns recommended strategies on how to positively address this and avoid unhealthy pitfalls.  Facts on Fats  Clinical staff conducted group or individual video education with verbal and written material and guidebook.  Patient learns that lifestyle modifications can be just as effective, if not more so, as many medications for lowering your risk of heart disease. A Pritikin lifestyle can help to reduce your risk of inflammation and atherosclerosis (cholesterol build-up, or plaque, in the artery walls). Lifestyle interventions such as dietary choices and physical activity address the cause of atherosclerosis. A review of the types of fats and their impact on blood cholesterol levels, along with dietary recommendations to reduce fat intake is also included.  Nutrition Action Plan  Clinical staff conducted group or individual video education with verbal and written material and guidebook.  Patient learns how to incorporate Pritikin recommendations into their lifestyle.  Recommendations include planning and keeping personal health goals in mind as an important part of their success.  Healthy Mind-Set    Healthy Minds, Bodies, Hearts  Clinical staff conducted group or individual video education with verbal and written material and guidebook.  Patient learns how to identify when they are  stressed. Video will discuss the impact of that stress, as well as the many benefits of stress management. Patient will also be introduced to stress management techniques. The way we think, act, and feel has an impact on our hearts.  How Our Thoughts Can Heal Our Hearts  Clinical staff conducted group or individual video education with verbal and written material and guidebook.  Patient learns that negative thoughts can cause depression and anxiety. This can result in negative lifestyle behavior and serious health problems. Cognitive behavioral therapy is an effective method to help control our thoughts in order to change and improve our emotional outlook.  Additional Videos:  Exercise    Improving Performance  Clinical staff conducted group or individual video education with verbal and written material and guidebook.  Patient learns to use a non-linear approach by alternating intensity levels and lengths of time spent exercising to help burn more calories and lose more body fat. Cardiovascular exercise helps improve heart health, metabolism, hormonal balance, blood sugar control, and recovery from fatigue. Resistance training improves strength, endurance, balance, coordination, reaction time, metabolism, and muscle mass. Flexibility exercise improves circulation, posture, and balance. Seek guidance from your physician and exercise physiologist before implementing an exercise routine and learn your capabilities and proper form for all exercise.  Introduction to Yoga  Clinical staff conducted group or individual video education with verbal and written material and guidebook.   Patient learns about yoga, a discipline of the coming together of mind, breath, and body. The benefits of yoga include improved flexibility, improved range of motion, better posture and core strength, increased lung function, weight loss, and positive self-image. Yoga's heart health benefits include lowered blood pressure, healthier heart rate, decreased cholesterol and triglyceride levels, improved immune function, and reduced stress. Seek guidance from your physician and exercise physiologist before implementing an exercise routine and learn your capabilities and proper form for all exercise.  Medical   Aging: Enhancing Your Quality of Life  Clinical staff conducted group or individual video education with verbal and written material and guidebook.  Patient learns key strategies and recommendations to stay in good physical health and enhance quality of life, such as prevention strategies, having an advocate, securing a Health Care Proxy and Power of Attorney, and keeping a list of medications and system for tracking them. It also discusses how to avoid risk for bone loss.  Biology of Weight Control  Clinical staff conducted group or individual video education with verbal and written material and guidebook.  Patient learns that weight gain occurs because we consume more calories than we burn (eating more, moving less). Even if your body weight is normal, you may have higher ratios of fat compared to muscle mass. Too much body fat puts you at increased risk for cardiovascular disease, heart attack, stroke, type 2 diabetes, and obesity-related cancers. In addition to exercise, following the Pritikin Eating Plan can help reduce your risk.  Decoding Lab Results  Clinical staff conducted group or individual video education with verbal and written material and guidebook.  Patient learns that lab test reflects one measurement whose values change over time and are influenced by many factors, including  medication, stress, sleep, exercise, food, hydration, pre-existing medical conditions, and more. It is recommended to use the knowledge from this video to become more involved with your lab results and evaluate your numbers to speak with your doctor.   Diseases of Our Time - Overview  Clinical staff conducted group or individual video education with verbal and  written material and guidebook.  Patient learns that according to the CDC, 50% to 70% of chronic diseases (such as obesity, type 2 diabetes, elevated lipids, hypertension, and heart disease) are avoidable through lifestyle improvements including healthier food choices, listening to satiety cues, and increased physical activity.  Sleep Disorders Clinical staff conducted group or individual video education with verbal and written material and guidebook.  Patient learns how good quality and duration of sleep are important to overall health and well-being. Patient also learns about sleep disorders and how they impact health along with recommendations to address them, including discussing with a physician.  Nutrition  Dining Out - Part 2 Clinical staff conducted group or individual video education with verbal and written material and guidebook.  Patient learns how to plan ahead and communicate in order to maximize their dining experience in a healthy and nutritious manner. Included are recommended food choices based on the type of restaurant the patient is visiting.   Fueling a Banker conducted group or individual video education with verbal and written material and guidebook.  There is a strong connection between our food choices and our health. Diseases like obesity and type 2 diabetes are very prevalent and are in large-part due to lifestyle choices. The Pritikin Eating Plan provides plenty of food and hunger-curbing satisfaction. It is easy to follow, affordable, and helps reduce health risks.  Menu Workshop  Clinical  staff conducted group or individual video education with verbal and written material and guidebook.  Patient learns that restaurant meals can sabotage health goals because they are often packed with calories, fat, sodium, and sugar. Recommendations include strategies to plan ahead and to communicate with the manager, chef, or server to help order a healthier meal.  Planning Your Eating Strategy  Clinical staff conducted group or individual video education with verbal and written material and guidebook.  Patient learns about the Pritikin Eating Plan and its benefit of reducing the risk of disease. The Pritikin Eating Plan does not focus on calories. Instead, it emphasizes high-quality, nutrient-rich foods. By knowing the characteristics of the foods, we choose, we can determine their calorie density and make informed decisions.  Targeting Your Nutrition Priorities  Clinical staff conducted group or individual video education with verbal and written material and guidebook.  Patient learns that lifestyle habits have a tremendous impact on disease risk and progression. This video provides eating and physical activity recommendations based on your personal health goals, such as reducing LDL cholesterol, losing weight, preventing or controlling type 2 diabetes, and reducing high blood pressure.  Vitamins and Minerals  Clinical staff conducted group or individual video education with verbal and written material and guidebook.  Patient learns different ways to obtain key vitamins and minerals, including through a recommended healthy diet. It is important to discuss all supplements you take with your doctor.   Healthy Mind-Set    Smoking Cessation  Clinical staff conducted group or individual video education with verbal and written material and guidebook.  Patient learns that cigarette smoking and tobacco addiction pose a serious health risk which affects millions of people. Stopping smoking will  significantly reduce the risk of heart disease, lung disease, and many forms of cancer. Recommended strategies for quitting are covered, including working with your doctor to develop a successful plan.  Culinary   Becoming a Set designer conducted group or individual video education with verbal and written material and guidebook.  Patient learns that cooking at home can  be healthy, cost-effective, quick, and puts them in control. Keys to cooking healthy recipes will include looking at your recipe, assessing your equipment needs, planning ahead, making it simple, choosing cost-effective seasonal ingredients, and limiting the use of added fats, salts, and sugars.  Cooking - Breakfast and Snacks  Clinical staff conducted group or individual video education with verbal and written material and guidebook.  Patient learns how important breakfast is to satiety and nutrition through the entire day. Recommendations include key foods to eat during breakfast to help stabilize blood sugar levels and to prevent overeating at meals later in the day. Planning ahead is also a key component.  Cooking - Educational psychologist conducted group or individual video education with verbal and written material and guidebook.  Patient learns eating strategies to improve overall health, including an approach to cook more at home. Recommendations include thinking of animal protein as a side on your plate rather than center stage and focusing instead on lower calorie dense options like vegetables, fruits, whole grains, and plant-based proteins, such as beans. Making sauces in large quantities to freeze for later and leaving the skin on your vegetables are also recommended to maximize your experience.  Cooking - Healthy Salads and Dressing Clinical staff conducted group or individual video education with verbal and written material and guidebook.  Patient learns that vegetables, fruits, whole grains,  and legumes are the foundations of the Pritikin Eating Plan. Recommendations include how to incorporate each of these in flavorful and healthy salads, and how to create homemade salad dressings. Proper handling of ingredients is also covered. Cooking - Soups and [employer] - Soups and Desserts Clinical staff conducted group or individual video education with verbal and written material and guidebook.  Patient learns that Pritikin soups and desserts make for easy, nutritious, and delicious snacks and meal components that are low in sodium, fat, sugar, and calorie density, while high in vitamins, minerals, and filling fiber. Recommendations include simple and healthy ideas for soups and desserts.   Overview     The Pritikin Solution Program Overview Clinical staff conducted group or individual video education with verbal and written material and guidebook.  Patient learns that the results of the Pritikin Program have been documented in more than 100 articles published in peer-reviewed journals, and the benefits include reducing risk factors for (and, in some cases, even reversing) high cholesterol, high blood pressure, type 2 diabetes, obesity, and more! An overview of the three key pillars of the Pritikin Program will be covered: eating well, doing regular exercise, and having a healthy mind-set.  WORKSHOPS  Exercise: Exercise Basics: Building Your Action Plan Clinical staff led group instruction and group discussion with PowerPoint presentation and patient guidebook. To enhance the learning environment the use of posters, models and videos may be added. At the conclusion of this workshop, patients will comprehend the difference between physical activity and exercise, as well as the benefits of incorporating both, into their routine. Patients will understand the FITT (Frequency, Intensity, Time, and Type) principle and how to use it to build an exercise action plan. In addition, safety  concerns and other considerations for exercise and cardiac rehab will be addressed by the presenter. The purpose of this lesson is to promote a comprehensive and effective weekly exercise routine in order to improve patients' overall level of fitness.   Managing Heart Disease: Your Path to a Healthier Heart Clinical staff led group instruction and group discussion with PowerPoint presentation and  patient guidebook. To enhance the learning environment the use of posters, models and videos may be added.At the conclusion of this workshop, patients will understand the anatomy and physiology of the heart. Additionally, they will understand how Pritikin's three pillars impact the risk factors, the progression, and the management of heart disease.  The purpose of this lesson is to provide a high-level overview of the heart, heart disease, and how the Pritikin lifestyle positively impacts risk factors.  Exercise Biomechanics Clinical staff led group instruction and group discussion with PowerPoint presentation and patient guidebook. To enhance the learning environment the use of posters, models and videos may be added. Patients will learn how the structural parts of their bodies function and how these functions impact their daily activities, movement, and exercise. Patients will learn how to promote a neutral spine, learn how to manage pain, and identify ways to improve their physical movement in order to promote healthy living. The purpose of this lesson is to expose patients to common physical limitations that impact physical activity. Participants will learn practical ways to adapt and manage aches and pains, and to minimize their effect on regular exercise. Patients will learn how to maintain good posture while sitting, walking, and lifting.  Balance Training and Fall Prevention  Clinical staff led group instruction and group discussion with PowerPoint presentation and patient guidebook. To  enhance the learning environment the use of posters, models and videos may be added. At the conclusion of this workshop, patients will understand the importance of their sensorimotor skills (vision, proprioception, and the vestibular system) in maintaining their ability to balance as they age. Patients will apply a variety of balancing exercises that are appropriate for their current level of function. Patients will understand the common causes for poor balance, possible solutions to these problems, and ways to modify their physical environment in order to minimize their fall risk. The purpose of this lesson is to teach patients about the importance of maintaining balance as they age and ways to minimize their risk of falling.  WORKSHOPS   Nutrition:  Fueling a Ship broker led group instruction and group discussion with PowerPoint presentation and patient guidebook. To enhance the learning environment the use of posters, models and videos may be added. Patients will review the foundational principles of the Pritikin Eating Plan and understand what constitutes a serving size in each of the food groups. Patients will also learn Pritikin-friendly foods that are better choices when away from home and review make-ahead meal and snack options. Calorie density will be reviewed and applied to three nutrition priorities: weight maintenance, weight loss, and weight gain. The purpose of this lesson is to reinforce (in a group setting) the key concepts around what patients are recommended to eat and how to apply these guidelines when away from home by planning and selecting Pritikin-friendly options. Patients will understand how calorie density may be adjusted for different weight management goals.  Mindful Eating  Clinical staff led group instruction and group discussion with PowerPoint presentation and patient guidebook. To enhance the learning environment the use of posters, models and videos may  be added. Patients will briefly review the concepts of the Pritikin Eating Plan and the importance of low-calorie dense foods. The concept of mindful eating will be introduced as well as the importance of paying attention to internal hunger signals. Triggers for non-hunger eating and techniques for dealing with triggers will be explored. The purpose of this lesson is to provide patients with the opportunity to  review the basic principles of the Pritikin Eating Plan, discuss the value of eating mindfully and how to measure internal cues of hunger and fullness using the Hunger Scale. Patients will also discuss reasons for non-hunger eating and learn strategies to use for controlling emotional eating.  Targeting Your Nutrition Priorities Clinical staff led group instruction and group discussion with PowerPoint presentation and patient guidebook. To enhance the learning environment the use of posters, models and videos may be added. Patients will learn how to determine their genetic susceptibility to disease by reviewing their family history. Patients will gain insight into the importance of diet as part of an overall healthy lifestyle in mitigating the impact of genetics and other environmental insults. The purpose of this lesson is to provide patients with the opportunity to assess their personal nutrition priorities by looking at their family history, their own health history and current risk factors. Patients will also be able to discuss ways of prioritizing and modifying the Pritikin Eating Plan for their highest risk areas  Menu  Clinical staff led group instruction and group discussion with PowerPoint presentation and patient guidebook. To enhance the learning environment the use of posters, models and videos may be added. Using menus brought in from [employer], or printed from [employer], patients will apply the Pritikin dining out guidelines that were presented in the [employer] video. Patients will also be able to practice these guidelines in a variety of provided scenarios. The purpose of this lesson is to provide patients with the opportunity to practice hands-on learning of the Pritikin Dining Out guidelines with actual menus and practice scenarios.  Label Reading Clinical staff led group instruction and group discussion with PowerPoint presentation and patient guidebook. To enhance the learning environment the use of posters, models and videos may be added. Patients will review and discuss the Pritikin label reading guidelines presented in Pritikin's Label Reading Educational series video. Using fool labels brought in from local grocery stores and markets, patients will apply the label reading guidelines and determine if the packaged food meet the Pritikin guidelines. The purpose of this lesson is to provide patients with the opportunity to review, discuss, and practice hands-on learning of the Pritikin Label Reading guidelines with actual packaged food labels. Cooking School  Pritikin's [employer] are designed to teach patients ways to prepare quick, simple, and affordable recipes at home. The importance of nutrition's role in chronic disease risk reduction is reflected in its emphasis in the overall Pritikin program. By learning how to prepare essential core Pritikin Eating Plan recipes, patients will increase control over what they eat; be able to customize the flavor of foods without the use of added salt, sugar, or fat; and improve the quality of the food they consume. By learning a set of core recipes which are easily assembled, quickly prepared, and affordable, patients are more likely to prepare more healthy foods at home. These workshops focus on convenient breakfasts, simple entres, side dishes, and desserts which can be prepared with minimal effort and are consistent with nutrition recommendations for cardiovascular risk reduction. Cooking  [employer] are taught by a Armed forces logistics/support/administrative officer (RD) who has been trained by the [employer]. The chef or RD has a clear understanding of the importance of minimizing - if not completely eliminating - added fat, sugar, and sodium in recipes. Throughout the series of Cooking School Workshop sessions, patients will learn about healthy ingredients and efficient methods of cooking to  build confidence in their capability to prepare    Cooking School weekly topics:  Adding Flavor- Sodium-Free  Fast and Healthy Breakfasts  Powerhouse Plant-Based Proteins  Satisfying Salads and Dressings  Simple Sides and Sauces  International Cuisine-Spotlight on the [employer] Zones  Delicious Desserts  Savory Soups  [employer] - Meals in a Snap  Tasty Appetizers and Snacks  Comforting Weekend Breakfasts  One-Pot Wonders   Fast Evening Meals  Landscape architect Your Pritikin Plate  WORKSHOPS   Healthy Mindset (Psychosocial):  Focused Goals, Sustainable Changes Clinical staff led group instruction and group discussion with PowerPoint presentation and patient guidebook. To enhance the learning environment the use of posters, models and videos may be added. Patients will be able to apply effective goal setting strategies to establish at least one personal goal, and then take consistent, meaningful action toward that goal. They will learn to identify common barriers to achieving personal goals and develop strategies to overcome them. Patients will also gain an understanding of how our mind-set can impact our ability to achieve goals and the importance of cultivating a positive and growth-oriented mind-set. The purpose of this lesson is to provide patients with a deeper understanding of how to set and achieve personal goals, as well as the tools and strategies needed to overcome common obstacles which may arise along the way.  From Head to Heart: The Power of a Healthy  Outlook  Clinical staff led group instruction and group discussion with PowerPoint presentation and patient guidebook. To enhance the learning environment the use of posters, models and videos may be added. Patients will be able to recognize and describe the impact of emotions and mood on physical health. They will discover the importance of self-care and explore self-care practices which may work for them. Patients will also learn how to utilize the 4 C's to cultivate a healthier outlook and better manage stress and challenges. The purpose of this lesson is to demonstrate to patients how a healthy outlook is an essential part of maintaining good health, especially as they continue their cardiac rehab journey.  Healthy Sleep for a Healthy Heart Clinical staff led group instruction and group discussion with PowerPoint presentation and patient guidebook. To enhance the learning environment the use of posters, models and videos may be added. At the conclusion of this workshop, patients will be able to demonstrate knowledge of the importance of sleep to overall health, well-being, and quality of life. They will understand the symptoms of, and treatments for, common sleep disorders. Patients will also be able to identify daytime and nighttime behaviors which impact sleep, and they will be able to apply these tools to help manage sleep-related challenges. The purpose of this lesson is to provide patients with a general overview of sleep and outline the importance of quality sleep. Patients will learn about a few of the most common sleep disorders. Patients will also be introduced to the concept of "sleep hygiene," and discover ways to self-manage certain sleeping problems through simple daily behavior changes. Finally, the workshop will motivate patients by clarifying the links between quality sleep and their goals of heart-healthy living.   Recognizing and Reducing Stress Clinical staff led group instruction and  group discussion with PowerPoint presentation and patient guidebook. To enhance the learning environment the use of posters, models and videos may be added. At the conclusion of this workshop, patients will be able to understand the types of stress reactions, differentiate between acute and chronic stress, and recognize the  impact that chronic stress has on their health. They will also be able to apply different coping mechanisms, such as reframing negative self-talk. Patients will have the opportunity to practice a variety of stress management techniques, such as deep abdominal breathing, progressive muscle relaxation, and/or guided imagery.  The purpose of this lesson is to educate patients on the role of stress in their lives and to provide healthy techniques for coping with it.  Learning Barriers/Preferences:  Learning Barriers/Preferences - 07/02/24 1028       Learning Barriers/Preferences   Learning Barriers None    Learning Preferences Audio;Computer/Internet;Group Instruction;Individual Instruction;Pictoral;Skilled Demonstration;Verbal Instruction;Video;Written Material          Education Topics:  Knowledge Questionnaire Score:  Knowledge Questionnaire Score - 07/02/24 1347       Knowledge Questionnaire Score   Pre Score 22/24          Core Components/Risk Factors/Patient Goals at Admission:  Personal Goals and Risk Factors at Admission - 07/02/24 1007       Core Components/Risk Factors/Patient Goals on Admission    Weight Management Yes;Obesity;Weight Loss    Intervention Weight Management/Obesity: Establish reasonable short term and long term weight goals.;Obesity: Provide education and appropriate resources to help participant work on and attain dietary goals.    Admit Weight 188 lb 15 oz (85.7 kg)    Goal Weight: Short Term 172 lb 12.8 oz (78.4 kg)    Goal Weight: Long Term 160 lb (72.6 kg)    Expected Outcomes Short Term: Continue to assess and modify interventions  until short term weight is achieved;Long Term: Adherence to nutrition and physical activity/exercise program aimed toward attainment of established weight goal;Weight Loss: Understanding of general recommendations for a balanced deficit meal plan, which promotes 1-2 lb weight loss per week and includes a negative energy balance of [phone] kcal/d    Diabetes Yes    Intervention Provide education about signs/symptoms and action to take for hypo/hyperglycemia.;Provide education about proper nutrition, including hydration, and aerobic/resistive exercise prescription along with prescribed medications to achieve blood glucose in normal ranges: Fasting glucose 65-99 mg/dL    Expected Outcomes Short Term: Participant verbalizes understanding of the signs/symptoms and immediate care of hyper/hypoglycemia, proper foot care and importance of medication, aerobic/resistive exercise and nutrition plan for blood glucose control.;Long Term: Attainment of HbA1C < 7%.    Hypertension Yes    Intervention Provide education on lifestyle modifcations including regular physical activity/exercise, weight management, moderate sodium restriction and increased consumption of fresh fruit, vegetables, and low fat dairy, alcohol moderation, and smoking cessation.;Monitor prescription use compliance.    Expected Outcomes Short Term: Continued assessment and intervention until BP is < 140/33mm HG in hypertensive participants. < 130/62mm HG in hypertensive participants with diabetes, heart failure or chronic kidney disease.;Long Term: Maintenance of blood pressure at goal levels.    Lipids Yes    Intervention Provide education and support for participant on nutrition & aerobic/resistive exercise along with prescribed medications to achieve LDL 70mg , HDL >40mg .    Expected Outcomes Short Term: Participant states understanding of desired cholesterol values and is compliant with medications prescribed. Participant is following exercise  prescription and nutrition guidelines.;Long Term: Cholesterol controlled with medications as prescribed, with individualized exercise RX and with personalized nutrition plan. Value goals: LDL < 70mg , HDL > 40 mg.          Core Components/Risk Factors/Patient Goals Review:   Goals and Risk Factor Review     Row Name 07/10/24 1651  Core Components/Risk Factors/Patient Goals Review   Personal Goals Review Weight Management/Obesity;Diabetes;Hypertension;Lipids       Review Hermione started cardiac rehab on 07/10/24. Truc did well with exercise. vital signs and CBg's were stable.       Expected Outcomes Kenniyah will continue to participate in cardiac rehab for exercise, nutrtion and lifestyle modifications          Core Components/Risk Factors/Patient Goals at Discharge (Final Review):   Goals and Risk Factor Review - 07/10/24 1651       Core Components/Risk Factors/Patient Goals Review   Personal Goals Review Weight Management/Obesity;Diabetes;Hypertension;Lipids    Review Gudrun started cardiac rehab on 07/10/24. Avagrace did well with exercise. vital signs and CBg's were stable.    Expected Outcomes Alecia will continue to participate in cardiac rehab for exercise, nutrtion and lifestyle modifications          ITP Comments:  ITP Comments     Row Name 07/02/24 0902 07/10/24 1650         ITP Comments Medical Director- Dr. Wilbert Bihari, MD. Introduction to the [facility]. Review of initial orientation folder. 30 Day ITP Review. Brittay started cardiac rehab on 07/10/24. Hanifa did well with exercise.         Comments: See ITP comments.[name] RN BSN

## 2024-07-12 ENCOUNTER — Encounter (HOSPITAL_COMMUNITY)
Admission: RE | Admit: 2024-07-12 | Discharge: 2024-07-12 | Disposition: A | Discharge status: Home / Self Care | Source: Ambulatory Visit | Attending: Cardiology | Admitting: Cardiology

## 2024-07-12 DIAGNOSIS — Z48812 Encounter for surgical aftercare following surgery on the circulatory system: Secondary | ICD-10-CM | POA: Diagnosis not present

## 2024-07-12 DIAGNOSIS — Z955 Presence of coronary angioplasty implant and graft: Secondary | ICD-10-CM

## 2024-07-15 ENCOUNTER — Encounter (HOSPITAL_COMMUNITY)
Admission: RE | Admit: 2024-07-15 | Discharge: 2024-07-15 | Disposition: A | Discharge status: Home / Self Care | Source: Ambulatory Visit | Attending: Cardiology | Admitting: Cardiology

## 2024-07-15 DIAGNOSIS — Z48812 Encounter for surgical aftercare following surgery on the circulatory system: Secondary | ICD-10-CM | POA: Diagnosis not present

## 2024-07-15 DIAGNOSIS — Z955 Presence of coronary angioplasty implant and graft: Secondary | ICD-10-CM

## 2024-07-15 LAB — GLUCOSE, CAPILLARY
Glucose-Capillary: 86 mg/dL (ref 70–99)
Glucose-Capillary: 91 mg/dL (ref 70–99)

## 2024-07-17 ENCOUNTER — Encounter (HOSPITAL_COMMUNITY)
Admission: RE | Admit: 2024-07-17 | Discharge: 2024-07-17 | Disposition: A | Discharge status: Home / Self Care | Source: Ambulatory Visit | Attending: Cardiology | Admitting: Cardiology

## 2024-07-17 DIAGNOSIS — Z955 Presence of coronary angioplasty implant and graft: Secondary | ICD-10-CM

## 2024-07-17 DIAGNOSIS — Z48812 Encounter for surgical aftercare following surgery on the circulatory system: Secondary | ICD-10-CM | POA: Diagnosis not present

## 2024-07-19 ENCOUNTER — Encounter (HOSPITAL_COMMUNITY): Admission: RE | Admit: 2024-07-19 | Source: Ambulatory Visit

## 2024-07-19 ENCOUNTER — Telehealth (HOSPITAL_COMMUNITY): Payer: Self-pay

## 2024-07-19 NOTE — Telephone Encounter (Signed)
 Patient left message at 8:27am calling out for 8:15 CR class, stated she is not feeling well.

## 2024-07-22 ENCOUNTER — Encounter (HOSPITAL_COMMUNITY)
Admission: RE | Admit: 2024-07-22 | Discharge: 2024-07-22 | Disposition: A | Discharge status: Home / Self Care | Source: Ambulatory Visit | Attending: Cardiology | Admitting: Cardiology

## 2024-07-24 ENCOUNTER — Encounter (HOSPITAL_COMMUNITY)
Admission: RE | Admit: 2024-07-24 | Discharge: 2024-07-24 | Disposition: A | Discharge status: Home / Self Care | Source: Ambulatory Visit | Attending: Cardiology | Admitting: Cardiology

## 2024-07-24 DIAGNOSIS — Z48812 Encounter for surgical aftercare following surgery on the circulatory system: Secondary | ICD-10-CM | POA: Diagnosis not present

## 2024-07-24 DIAGNOSIS — Z955 Presence of coronary angioplasty implant and graft: Secondary | ICD-10-CM

## 2024-07-26 ENCOUNTER — Encounter (HOSPITAL_COMMUNITY)
Admission: RE | Admit: 2024-07-26 | Discharge: 2024-07-26 | Disposition: A | Discharge status: Home / Self Care | Source: Ambulatory Visit | Attending: Cardiology | Admitting: Cardiology

## 2024-07-26 DIAGNOSIS — Z955 Presence of coronary angioplasty implant and graft: Secondary | ICD-10-CM

## 2024-07-26 DIAGNOSIS — Z48812 Encounter for surgical aftercare following surgery on the circulatory system: Secondary | ICD-10-CM | POA: Diagnosis not present

## 2024-07-29 ENCOUNTER — Encounter (HOSPITAL_COMMUNITY)
Admission: RE | Admit: 2024-07-29 | Discharge: 2024-07-29 | Disposition: A | Discharge status: Home / Self Care | Source: Ambulatory Visit | Attending: Cardiology

## 2024-07-29 DIAGNOSIS — Z955 Presence of coronary angioplasty implant and graft: Secondary | ICD-10-CM

## 2024-07-29 DIAGNOSIS — Z48812 Encounter for surgical aftercare following surgery on the circulatory system: Secondary | ICD-10-CM | POA: Diagnosis not present

## 2024-07-31 ENCOUNTER — Encounter (HOSPITAL_COMMUNITY)
Admission: RE | Admit: 2024-07-31 | Discharge: 2024-07-31 | Disposition: A | Discharge status: Home / Self Care | Source: Ambulatory Visit | Attending: Cardiology

## 2024-07-31 DIAGNOSIS — Z48812 Encounter for surgical aftercare following surgery on the circulatory system: Secondary | ICD-10-CM | POA: Diagnosis not present

## 2024-07-31 DIAGNOSIS — Z955 Presence of coronary angioplasty implant and graft: Secondary | ICD-10-CM

## 2024-08-02 ENCOUNTER — Encounter (HOSPITAL_COMMUNITY)
Admission: RE | Admit: 2024-08-02 | Discharge: 2024-08-02 | Disposition: A | Discharge status: Home / Self Care | Source: Ambulatory Visit | Attending: Cardiology | Admitting: Cardiology

## 2024-08-02 DIAGNOSIS — Z955 Presence of coronary angioplasty implant and graft: Secondary | ICD-10-CM

## 2024-08-02 DIAGNOSIS — Z48812 Encounter for surgical aftercare following surgery on the circulatory system: Secondary | ICD-10-CM | POA: Diagnosis not present

## 2024-08-05 ENCOUNTER — Encounter (HOSPITAL_COMMUNITY)

## 2024-08-07 ENCOUNTER — Encounter (HOSPITAL_COMMUNITY)

## 2024-08-09 ENCOUNTER — Encounter (HOSPITAL_COMMUNITY)

## 2024-08-12 ENCOUNTER — Encounter (HOSPITAL_COMMUNITY)
Admission: RE | Admit: 2024-08-12 | Discharge: 2024-08-12 | Disposition: A | Discharge status: Home / Self Care | Source: Ambulatory Visit | Attending: Cardiology | Admitting: Cardiology

## 2024-08-12 DIAGNOSIS — Z955 Presence of coronary angioplasty implant and graft: Secondary | ICD-10-CM | POA: Diagnosis present

## 2024-08-13 NOTE — Progress Notes (Signed)
 Cardiac Individual Treatment Plan  Patient Details  Name: Virginia [name] MRN: [id number] Date of Birth: 1961-01-29 Referring Provider:   Flowsheet Row CARDIAC REHAB PHASE II ORIENTATION from 07/02/2024 in [facility]  Referring Provider Ladona Heinz, MD    Initial Encounter Date:  Flowsheet Row CARDIAC REHAB PHASE II ORIENTATION from 07/02/2024 in [facility]  Date 07/02/24    Visit Diagnosis: 05/30/24 DES LAD x2, LCx  Patient's Home Medications on Admission:  Current Outpatient Medications:    acetaminophen  (TYLENOL ) 500 MG tablet, Take 1 tablet (500 mg total) by mouth every 6 (six) hours as needed., Disp: 30 tablet, Rfl: 0   amLODipine  (NORVASC ) 5 MG tablet, Take 1 tablet (5 mg total) by mouth daily., Disp: 90 tablet, Rfl: 1   aspirin  EC 81 MG tablet, Take 1 tablet (81 mg total) by mouth daily., Disp: 30 tablet, Rfl: 6   empagliflozin  (JARDIANCE ) 10 MG TABS tablet, Take 1 tablet (10 mg total) by mouth daily before breakfast., Disp: 90 tablet, Rfl: 3   Evolocumab  (REPATHA  SURECLICK) 140 MG/ML SOAJ, Inject 140 mg into the skin every 14 (fourteen) days., Disp: 6 mL, Rfl: 3   ezetimibe  (ZETIA ) 10 MG tablet, Take 1 tablet (10 mg total) by mouth at bedtime. (Patient not taking: Reported on 07/02/2024), Disp: 30 tablet, Rfl: 1   losartan  (COZAAR ) 50 MG tablet, Take 1 tablet (50 mg total) by mouth daily., Disp: 90 tablet, Rfl: 1   metoprolol  succinate (TOPROL -XL) 25 MG 24 hr tablet, Take 1 tablet (25 mg total) by mouth daily., Disp: 90 tablet, Rfl: 3   nitroGLYCERIN  (NITROSTAT ) 0.4 MG SL tablet, Place 1 tablet (0.4 mg total) under the tongue every 5 (five) minutes as needed for chest pain., Disp: 25 tablet, Rfl: 3   pantoprazole  (PROTONIX ) 40 MG tablet, Take 1 tablet (40 mg total) by mouth daily., Disp: 90 tablet, Rfl: 3   rosuvastatin  (CRESTOR ) 40 MG tablet, Take 1 tablet (40 mg  total) by mouth daily., Disp: 90 tablet, Rfl: 1   ticagrelor  (BRILINTA ) 90 MG TABS tablet, Take 1 tablet (90 mg total) by mouth 2 (two) times daily., Disp: 180 tablet, Rfl: 3  Past Medical History: Past Medical History:  Diagnosis Date   Anemia    CAD (coronary artery disease)    a. 06/2015 BMS x2 to LCx. b. NSTEMI 11/2020 s/p DES to prox LAD; residual disease treated medically.   CKD (chronic kidney disease), stage II    Diabetes mellitus (HCC)    GERD (gastroesophageal reflux disease)    ?   GI bleeding    2016 - bleeding polyp clipped   Hyperlipidemia with target LDL less than 70    Hypertension    STEMI (ST elevation myocardial infarction) (HCC) 06/16/2015   BMS x 2 CFX   Thyroid  nodule    benign    Tobacco Use: Social History   Tobacco Use  Smoking Status Never  Smokeless Tobacco Never    Labs: Review Flowsheet  More data exists      Latest Ref Rng & Units 04/27/2021 08/03/2021 02/07/2023 05/26/2024 05/27/2024  Labs for ITP Cardiac and Pulmonary Rehab  Cholestrol 0 - 200 mg/dL - - - - 876   LDL (calc) 0 - 99 mg/dL - - - - 56   HDL-C >59 mg/dL - - - - 46   Trlycerides <150 mg/dL - - - - 894  Hemoglobin A1c 4.8 - 5.6 % 6.0  6.0  6.0  6.0  6.3  6.3  6.3  6.3  6.4  6.6  6.7  -    Details       Multiple values from one day are sorted in reverse-chronological order         Capillary Blood Glucose: Lab Results  Component Value Date   GLUCAP 91 07/12/2024   GLUCAP 86 07/12/2024   GLUCAP 100 (H) 07/10/2024   GLUCAP 130 (H) 07/10/2024   GLUCAP 104 (H) 05/27/2024     Exercise Target Goals: Exercise Program Goal: Individual exercise prescription set using results from initial 6 min walk test and THRR while considering  patient's activity barriers and safety.   Exercise Prescription Goal: Initial exercise prescription builds to 30-45 minutes a day of aerobic activity, 2-3 days per week.  Home exercise guidelines will be given to patient during program as part of  exercise prescription that the participant will acknowledge.  Activity Barriers & Risk Stratification:  Activity Barriers & Cardiac Risk Stratification - 07/02/24 1007       Activity Barriers & Cardiac Risk Stratification   Activity Barriers None    Cardiac Risk Stratification Moderate          6 Minute Walk:  6 Minute Walk     Row Name 07/02/24 1052         6 Minute Walk   Phase Initial     Distance 1464 feet     Walk Time 6 minutes     # of Rest Breaks 0     MPH 2.77     METS 3.62     RPE 13     Perceived Dyspnea  1     VO2 Peak 12.66     Symptoms Yes (comment)     Comments Mild shortness of breath. Left calf cramp/ pain 8/10 on pain scale. Resolved with rest/ stretching.     Resting HR 77 bpm     Resting BP 158/72     Resting Oxygen Saturation  100 %     Exercise Oxygen Saturation  during 6 min walk 100 %     Max Ex. HR 105 bpm     Max Ex. BP 160/76     2 Minute Post BP 138/72        Oxygen Initial Assessment:   Oxygen Re-Evaluation:   Oxygen Discharge (Final Oxygen Re-Evaluation):   Initial Exercise Prescription:  Initial Exercise Prescription - 07/02/24 1200       Date of Initial Exercise RX and Referring Provider   Date 07/02/24    Referring Provider Ladona Heinz, MD    Expected Discharge Date 10/04/24      Treadmill   MPH 2    Grade 0    Minutes 15    METs 2.53      Recumbant Elliptical   Level 2    Minutes 15    METs 2.5      Prescription Details   Frequency (times per week) 3    Duration Progress to 30 minutes of continuous aerobic without signs/symptoms of physical distress      Intensity   THRR 40-80% of Max Heartrate 62-125    Ratings of Perceived Exertion 11-13    Perceived Dyspnea 0-4      Progression   Progression Continue to progress workloads to maintain intensity without signs/symptoms of physical distress.      Paramedic Prescription  Yes    Weight 2 lbs    Reps 10-15          Perform  Capillary Blood Glucose checks as needed.  Exercise Prescription Changes:   Exercise Prescription Changes     Row Name 07/10/24 1400 07/24/24 1200 08/12/24 1600         Response to Exercise   Blood Pressure (Admit) 118/80 112/60 104/70     Blood Pressure (Exercise) 140/72 140/72 --     Blood Pressure (Exit) 108/68 106/74 110/64     Heart Rate (Admit) 83 bpm 75 bpm 79 bpm     Heart Rate (Exercise) 118 bpm 104 bpm 111 bpm     Heart Rate (Exit) 91 bpm 80 bpm 78 bpm     Rating of Perceived Exertion (Exercise) 13 12 11      Symptoms None None None     Comments Pt's first day in the CRP2 program Reviewed METs Reviewed METs and goals     Duration Continue with 30 min of aerobic exercise without signs/symptoms of physical distress. Continue with 30 min of aerobic exercise without signs/symptoms of physical distress. Continue with 30 min of aerobic exercise without signs/symptoms of physical distress.     Intensity THRR unchanged THRR unchanged THRR unchanged       Progression   Progression Continue to progress workloads to maintain intensity without signs/symptoms of physical distress. Continue to progress workloads to maintain intensity without signs/symptoms of physical distress. Continue to progress workloads to maintain intensity without signs/symptoms of physical distress.     Average METs 2.2 2.3 2.8       Resistance Training   Training Prescription No No Yes     Weight No wts on wednesdays No wts on wednesdays 2 lbs     Reps -- -- 10-15     Time -- -- 5 Minutes       Interval Training   Interval Training No No No       Treadmill   MPH 1.8 2 2.2     Grade 0 0 0     Minutes 15 15 15      METs 2.38 2.53 2.69       Recumbant Elliptical   Level 1 1 2      RPM 43 41 47     Watts 47 44 60     Minutes 15 15 15      METs 2 1.9 2.9        Exercise Comments:   Exercise Comments     Row Name 07/10/24 1143 07/24/24 0750 08/07/24 0944 08/07/24 1432 08/12/24 1645   Exercise  Comments Pt's first day in the CRP2 program. Pt exercised without complaints and is off to a good start. Reviewed METs with patient. Peak METs have gone form 2.4 to as high as 2.9. Pt due for review of METs and goals but is not scheduled to return to the CRP2 progrram until 08/12/24. Will complete upon return. Pt was due for review of METs and goals today but did not attend. Will complete upon patients return. Reviewed METS and goals. Pt is progressing. Pt increased workload on Octane today and will increase workload on treadmill next session. METs have improved since last review for 2.3 to 2.8.      Exercise Goals and Review:   Exercise Goals     Row Name 07/02/24 1007             Exercise Goals   Increase Physical Activity Yes  Intervention Provide advice, education, support and counseling about physical activity/exercise needs.;Develop an individualized exercise prescription for aerobic and resistive training based on initial evaluation findings, risk stratification, comorbidities and participant's personal goals.       Expected Outcomes Short Term: Attend rehab on a regular basis to increase amount of physical activity.;Long Term: Exercising regularly at least 3-5 days a week.;Long Term: Add in home exercise to make exercise part of routine and to increase amount of physical activity.       Increase Strength and Stamina Yes       Intervention Provide advice, education, support and counseling about physical activity/exercise needs.;Develop an individualized exercise prescription for aerobic and resistive training based on initial evaluation findings, risk stratification, comorbidities and participant's personal goals.       Expected Outcomes Short Term: Increase workloads from initial exercise prescription for resistance, speed, and METs.;Short Term: Perform resistance training exercises routinely during rehab and add in resistance training at home;Long Term: Improve cardiorespiratory  fitness, muscular endurance and strength as measured by increased METs and functional capacity ( )       Able to understand and use rate of perceived exertion (RPE) scale Yes       Intervention Provide education and explanation on how to use RPE scale       Expected Outcomes Short Term: Able to use RPE daily in rehab to express subjective intensity level;Long Term:  Able to use RPE to guide intensity level when exercising independently       Knowledge and understanding of Target Heart Rate Range (THRR) Yes       Intervention Provide education and explanation of THRR including how the numbers were predicted and where they are located for reference       Expected Outcomes Short Term: Able to state/look up THRR;Short Term: Able to use daily as guideline for intensity in rehab;Long Term: Able to use THRR to govern intensity when exercising independently       Able to check pulse independently Yes       Intervention Provide education and demonstration on how to check pulse in carotid and radial arteries.;Review the importance of being able to check your own pulse for safety during independent exercise       Expected Outcomes Short Term: Able to explain why pulse checking is important during independent exercise;Long Term: Able to check pulse independently and accurately       Understanding of Exercise Prescription Yes       Intervention Provide education, explanation, and written materials on patient's individual exercise prescription       Expected Outcomes Short Term: Able to explain program exercise prescription;Long Term: Able to explain home exercise prescription to exercise independently          Exercise Goals Re-Evaluation :  Exercise Goals Re-Evaluation     Row Name 07/10/24 1142 08/12/24 1644           Exercise Goal Re-Evaluation   Exercise Goals Review Increase Physical Activity;Increase Strength and Stamina;Able to understand and use rate of perceived exertion (RPE) scale;Knowledge  and understanding of Target Heart Rate Range (THRR);Understanding of Exercise Prescription Increase Physical Activity;Increase Strength and Stamina;Able to understand and use rate of perceived exertion (RPE) scale;Knowledge and understanding of Target Heart Rate Range (THRR);Understanding of Exercise Prescription      Comments Pt's first day in the CRP2 program. Pt understands the exercise Rx, RPE scale and THRR. Reviewed METs and goals. Pt voices progress on her goals of exercising without  SOB adn we igth loss. Pt states the SOB is getting better and pt has lost 1.5 kg to date.      Expected Outcomes Will continue to monitor patient and progress exercise workloads as tolerated. Will continue to monitor patient and progress exercise workloads as tolerated.         Discharge Exercise Prescription (Final Exercise Prescription Changes):  Exercise Prescription Changes - 08/12/24 1600       Response to Exercise   Blood Pressure (Admit) 104/70    Blood Pressure (Exit) 110/64    Heart Rate (Admit) 79 bpm    Heart Rate (Exercise) 111 bpm    Heart Rate (Exit) 78 bpm    Rating of Perceived Exertion (Exercise) 11    Symptoms None    Comments Reviewed METs and goals    Duration Continue with 30 min of aerobic exercise without signs/symptoms of physical distress.    Intensity THRR unchanged      Progression   Progression Continue to progress workloads to maintain intensity without signs/symptoms of physical distress.    Average METs 2.8      Resistance Training   Training Prescription Yes    Weight 2 lbs    Reps 10-15    Time 5 Minutes      Interval Training   Interval Training No      Treadmill   MPH 2.2    Grade 0    Minutes 15    METs 2.69      Recumbant Elliptical   Level 2    RPM 47    Watts 60    Minutes 15    METs 2.9          Nutrition:  Target Goals: Understanding of nutrition guidelines, daily intake of sodium 1500mg , cholesterol 200mg , calories 30% from fat and 7%  or less from saturated fats, daily to have 5 or more servings of fruits and vegetables.  Biometrics:  Pre Biometrics - 07/02/24 0902       Pre Biometrics   Waist Circumference 36.75 inches    Hip Circumference 44 inches    Waist to Hip Ratio 0.84 %    Triceps Skinfold 41 mm    % Body Fat 42.3 %    Grip Strength 18 kg    Flexibility 14 in    Single Leg Stand 26.06 seconds           Nutrition Therapy Plan and Nutrition Goals:   Nutrition Assessments:  MEDIFICTS Score Key: >=70 Need to make dietary changes  40-70 Heart Healthy Diet <= 40 Therapeutic Level Cholesterol Diet   Flowsheet Row CARDIAC REHAB PHASE II EXERCISE from 07/22/2024 in [facility]  Picture Your Plate Total Score on Admission 60   Picture Your Plate Scores: <59 Unhealthy dietary pattern with much room for improvement. 41-50 Dietary pattern unlikely to meet recommendations for good health and room for improvement. 51-60 More healthful dietary pattern, with some room for improvement.  >60 Healthy dietary pattern, although there may be some specific behaviors that could be improved.    Nutrition Goals Re-Evaluation:   Nutrition Goals Re-Evaluation:   Nutrition Goals Discharge (Final Nutrition Goals Re-Evaluation):   Psychosocial: Target Goals: Acknowledge presence or absence of significant depression and/or stress, maximize coping skills, provide positive support system. Participant is able to verbalize types and ability to use techniques and skills needed for reducing stress and depression.  Initial Review & Psychosocial Screening:  Initial Psych Review & Screening - 07/02/24 1027       Initial Review   Current issues with None Identified      Family Dynamics   Good Support System? Yes    Comments Support from husband.      Barriers   Psychosocial barriers to participate in program There are no identifiable barriers or psychosocial  needs.      Screening Interventions   Interventions Encouraged to exercise;Provide feedback about the scores to participant    Expected Outcomes Short Term goal: Identification and review with participant of any Quality of Life or Depression concerns found by scoring the questionnaire.;Long Term goal: The participant improves quality of Life and PHQ9 Scores as seen by post scores and/or verbalization of changes          Quality of Life Scores:  Quality of Life - 07/02/24 1346       Quality of Life   Select Quality of Life      Quality of Life Scores   Health/Function Pre 19.43 %    Socioeconomic Pre 23.25 %    Psych/Spiritual Pre 22.29 %    Family Pre 30 %    GLOBAL Pre 22.39 %         Scores of 19 and below usually indicate a poorer quality of life in these areas.  A difference of  2-3 points is a clinically meaningful difference.  A difference of 2-3 points in the total score of the Quality of Life Index has been associated with significant improvement in overall quality of life, self-image, physical symptoms, and general health in studies assessing change in quality of life.  PHQ-9: Review Flowsheet  More data exists      07/02/2024 06/07/2024 08/03/2021 10/06/2020 04/03/2020  Depression screen PHQ 2/9  Decreased Interest 0 0 0 0 0  Down, Depressed, Hopeless 0 0 0 0 0  PHQ - 2 Score 0 0 0 0 0  Altered sleeping 1 0 - - -  Tired, decreased energy 1 3 - - -  Change in appetite 0 0 - - -  Feeling bad or failure about yourself  0 0 - - -  Trouble concentrating 0 0 - - -  Moving slowly or fidgety/restless 0 3 - - -  Suicidal thoughts 0 0 - - -  PHQ-9 Score 2 6 - - -  Difficult doing work/chores Somewhat difficult Not difficult at all - - -   Interpretation of Total Score  Total Score Depression Severity:  1-4 = Minimal depression, 5-9 = Mild depression, 10-14 = Moderate depression, 15-19 = Moderately severe depression, 20-27 = Severe depression   Psychosocial Evaluation  and Intervention:   Psychosocial Re-Evaluation:  Psychosocial Re-Evaluation     Row Name 07/10/24 1651 08/13/24 1534           Psychosocial Re-Evaluation   Current issues with None Identified None Identified      Interventions Encouraged to attend Cardiac Rehabilitation for the exercise Encouraged to attend Cardiac Rehabilitation for the exercise      Continue Psychosocial Services  No Follow up required No Follow up required         Psychosocial Discharge (Final Psychosocial Re-Evaluation):  Psychosocial Re-Evaluation - 08/13/24 1534       Psychosocial Re-Evaluation   Current issues with None Identified    Interventions Encouraged to attend Cardiac Rehabilitation for the exercise    Continue Psychosocial Services  No Follow up required  Vocational Rehabilitation: Provide vocational rehab assistance to qualifying candidates.   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          Education: Education Goals: Education classes will be provided on a weekly basis, covering required topics. Participant will state understanding/return demonstration of topics presented.    Education     Row Name 07/10/24 0900     Education   Cardiac Education Topics Pritikin   Secondary school teacher School   Educator Nurse   Weekly Topic One-Pot Wonders   Instruction Review Code 1- Verbalizes Understanding   Class Start Time 0815   Class Stop Time [phone]   Class Time Calculation (min) 40 min    Row Name 07/12/24 0900     Education   Cardiac Education Topics Pritikin   Select Core Videos     Core Videos   Educator Exercise Physiologist   Select General Education   General Education Hypertension and Heart  Disease   Instruction Review Code 1- Verbalizes Understanding   Class Start Time 0805   Class Stop Time 0845   Class Time Calculation (min) 40 min    Row Name 07/15/24 0800     Education   Cardiac Education Topics Pritikin   Select Workshops     Workshops   Educator Exercise Physiologist   Select Psychosocial   Psychosocial Workshop Focused Goals, Sustainable Changes   Instruction Review Code 1- Verbalizes Understanding   Class Start Time 0815   Class Stop Time 0850   Class Time Calculation (min) 35 min    Row Name 07/17/24 0800     Education   Cardiac Education Topics Pritikin   Orthoptist   Educator Nurse   Weekly Topic Comforting Weekend Breakfasts   Instruction Review Code 1- Verbalizes Understanding   Class Start Time 0815   Class Stop Time 0850   Class Time Calculation (min) 35 min    Row Name 07/24/24 0800     Education   Cardiac Education Topics Pritikin   Secondary school teacher School   Educator Nurse   Weekly Topic Fast Evening Meals   Instruction Review Code 1- Verbalizes Understanding   Class Start Time 0815   Class Stop Time 0847   Class Time Calculation (min) 32 min    Row Name 07/26/24 0900     Education   Cardiac Education Topics Pritikin   Select Core Videos     Core Videos   Educator Dietitian   Select Nutrition   Nutrition Vitamins and Minerals   Instruction Review Code 1- Verbalizes Understanding   Class Start Time 0815   Class Stop Time 0855   Class Time Calculation (min) 40 min    Row Name 07/29/24 0700     Education   Cardiac Education Topics Pritikin   Select Core Videos     Core Videos   Educator Exercise Physiologist   Select Exercise Education   Exercise Education Improving Performance   Instruction Review Code 1- Verbalizes Understanding   Class Start Time [phone]   Class Stop Time 0849   Class Time Calculation (min) 37 min    Row Name 07/31/24  0800     Education    Cardiac Education Topics Pritikin   Secondary school teacher School   Educator Nurse;Respiratory Therapist   Weekly Topic International Cuisine- Spotlight on the [facility] Zones   Instruction Review Code 1- Verbalizes Understanding   Class Start Time 0815   Class Stop Time [phone]   Class Time Calculation (min) 34 min    Row Name 08/02/24 0800     Education   Cardiac Education Topics Pritikin   Select Workshops     Core Videos   Educator Dietitian   Select Nutrition   Nutrition Fueling a Healthy Body   Instruction Review Code 1- Verbalizes Understanding   Class Start Time 0815   Class Stop Time 0847   Class Time Calculation (min) 32 min    Row Name 08/12/24 0800     Education   Cardiac Education Topics Pritikin   Select Core Videos     Core Videos   Educator Exercise Physiologist   Select General Education   General Education Heart Disease Risk Reduction   Instruction Review Code 1- Verbalizes Understanding   Class Start Time 0818   Class Stop Time 0853   Class Time Calculation (min) 35 min      Core Videos: Exercise    Move It!  Clinical staff conducted group or individual video education with verbal and written material and guidebook.  Patient learns the recommended Pritikin exercise program. Exercise with the goal of living a long, healthy life. Some of the health benefits of exercise include controlled diabetes, healthier blood pressure levels, improved cholesterol levels, improved heart and lung capacity, improved sleep, and better body composition. Everyone should speak with their doctor before starting or changing an exercise routine.  Biomechanical Limitations Clinical staff conducted group or individual video education with verbal and written material and guidebook.  Patient learns how biomechanical limitations can impact exercise and how we can mitigate and possibly overcome limitations to have an impactful and balanced exercise routine.  Body  Composition Clinical staff conducted group or individual video education with verbal and written material and guidebook.  Patient learns that body composition (ratio of muscle mass to fat mass) is a key component to assessing overall fitness, rather than body weight alone. Increased fat mass, especially visceral belly fat, can put us  at increased risk for metabolic syndrome, type 2 diabetes, heart disease, and even death. It is recommended to combine diet and exercise (cardiovascular and resistance training) to improve your body composition. Seek guidance from your physician and exercise physiologist before implementing an exercise routine.  Exercise Action Plan Clinical staff conducted group or individual video education with verbal and written material and guidebook.  Patient learns the recommended strategies to achieve and enjoy long-term exercise adherence, including variety, self-motivation, self-efficacy, and positive decision making. Benefits of exercise include fitness, good health, weight management, more energy, better sleep, less stress, and overall well-being.  Medical   Heart Disease Risk Reduction Clinical staff conducted group or individual video education with verbal and written material and guidebook.  Patient learns our heart is our most vital organ as it circulates oxygen, nutrients, white blood cells, and hormones throughout the entire body, and carries waste away. Data supports a plant-based eating plan like the Pritikin Program for its effectiveness in slowing progression of and reversing heart disease. The video provides a number of recommendations to address heart disease.   Metabolic Syndrome and Belly Fat  Clinical staff conducted group or individual  video education with verbal and written material and guidebook.  Patient learns what metabolic syndrome is, how it leads to heart disease, and how one can reverse it and keep it from coming back. You have metabolic syndrome if  you have 3 of the following 5 criteria: abdominal obesity, high blood pressure, high triglycerides, low HDL cholesterol, and high blood sugar.  Hypertension and Heart Disease Clinical staff conducted group or individual video education with verbal and written material and guidebook.  Patient learns that high blood pressure, or hypertension, is very common in the [place] . Hypertension is largely due to excessive salt intake, but other important risk factors include being overweight, physical inactivity, drinking too much alcohol, smoking, and not eating enough potassium from fruits and vegetables. High blood pressure is a leading risk factor for heart attack, stroke, congestive heart failure, dementia, kidney failure, and premature death. Long-term effects of excessive salt intake include stiffening of the arteries and thickening of heart muscle and organ damage. Recommendations include ways to reduce hypertension and the risk of heart disease.  Diseases of Our Time - Focusing on Diabetes Clinical staff conducted group or individual video education with verbal and written material and guidebook.  Patient learns why the best way to stop diseases of our time is prevention, through food and other lifestyle changes. Medicine (such as prescription pills and surgeries) is often only a Band-Aid on the problem, not a long-term solution. Most common diseases of our time include obesity, type 2 diabetes, hypertension, heart disease, and cancer. The Pritikin Program is recommended and has been proven to help reduce, reverse, and/or prevent the damaging effects of metabolic syndrome.  Nutrition   Overview of the Pritikin Eating Plan  Clinical staff conducted group or individual video education with verbal and written material and guidebook.  Patient learns about the Pritikin Eating Plan for disease risk reduction. The Pritikin Eating Plan emphasizes a wide variety of unrefined, minimally-processed  carbohydrates, like fruits, vegetables, whole grains, and legumes. Go, Caution, and Stop food choices are explained. Plant-based and lean animal proteins are emphasized. Rationale provided for low sodium intake for blood pressure control, low added sugars for blood sugar stabilization, and low added fats and oils for coronary artery disease risk reduction and weight management.  Calorie Density  Clinical staff conducted group or individual video education with verbal and written material and guidebook.  Patient learns about calorie density and how it impacts the Pritikin Eating Plan. Knowing the characteristics of the food you choose will help you decide whether those foods will lead to weight gain or weight loss, and whether you want to consume more or less of them. Weight loss is usually a side effect of the Pritikin Eating Plan because of its focus on low calorie-dense foods.  Label Reading  Clinical staff conducted group or individual video education with verbal and written material and guidebook.  Patient learns about the Pritikin recommended label reading guidelines and corresponding recommendations regarding calorie density, added sugars, sodium content, and whole grains.  Dining Out - Part 1  Clinical staff conducted group or individual video education with verbal and written material and guidebook.  Patient learns that restaurant meals can be sabotaging because they can be so high in calories, fat, sodium, and/or sugar. Patient learns recommended strategies on how to positively address this and avoid unhealthy pitfalls.  Facts on Fats  Clinical staff conducted group or individual video education with verbal and written material and guidebook.  Patient learns that lifestyle modifications  can be just as effective, if not more so, as many medications for lowering your risk of heart disease. A Pritikin lifestyle can help to reduce your risk of inflammation and atherosclerosis (cholesterol  build-up, or plaque, in the artery walls). Lifestyle interventions such as dietary choices and physical activity address the cause of atherosclerosis. A review of the types of fats and their impact on blood cholesterol levels, along with dietary recommendations to reduce fat intake is also included.  Nutrition Action Plan  Clinical staff conducted group or individual video education with verbal and written material and guidebook.  Patient learns how to incorporate Pritikin recommendations into their lifestyle. Recommendations include planning and keeping personal health goals in mind as an important part of their success.  Healthy Mind-Set    Healthy Minds, Bodies, Hearts  Clinical staff conducted group or individual video education with verbal and written material and guidebook.  Patient learns how to identify when they are stressed. Video will discuss the impact of that stress, as well as the many benefits of stress management. Patient will also be introduced to stress management techniques. The way we think, act, and feel has an impact on our hearts.  How Our Thoughts Can Heal Our Hearts  Clinical staff conducted group or individual video education with verbal and written material and guidebook.  Patient learns that negative thoughts can cause depression and anxiety. This can result in negative lifestyle behavior and serious health problems. Cognitive behavioral therapy is an effective method to help control our thoughts in order to change and improve our emotional outlook.  Additional Videos:  Exercise    Improving Performance  Clinical staff conducted group or individual video education with verbal and written material and guidebook.  Patient learns to use a non-linear approach by alternating intensity levels and lengths of time spent exercising to help burn more calories and lose more body fat. Cardiovascular exercise helps improve heart health, metabolism, hormonal balance, blood sugar  control, and recovery from fatigue. Resistance training improves strength, endurance, balance, coordination, reaction time, metabolism, and muscle mass. Flexibility exercise improves circulation, posture, and balance. Seek guidance from your physician and exercise physiologist before implementing an exercise routine and learn your capabilities and proper form for all exercise.  Introduction to Yoga  Clinical staff conducted group or individual video education with verbal and written material and guidebook.  Patient learns about yoga, a discipline of the coming together of mind, breath, and body. The benefits of yoga include improved flexibility, improved range of motion, better posture and core strength, increased lung function, weight loss, and positive self-image. Yoga's heart health benefits include lowered blood pressure, healthier heart rate, decreased cholesterol and triglyceride levels, improved immune function, and reduced stress. Seek guidance from your physician and exercise physiologist before implementing an exercise routine and learn your capabilities and proper form for all exercise.  Medical   Aging: Enhancing Your Quality of Life  Clinical staff conducted group or individual video education with verbal and written material and guidebook.  Patient learns key strategies and recommendations to stay in good physical health and enhance quality of life, such as prevention strategies, having an advocate, securing a Health Care Proxy and Power of Attorney, and keeping a list of medications and system for tracking them. It also discusses how to avoid risk for bone loss.  Biology of Weight Control  Clinical staff conducted group or individual video education with verbal and written material and guidebook.  Patient learns that weight gain occurs because we  consume more calories than we burn (eating more, moving less). Even if your body weight is normal, you may have higher ratios of fat compared to  muscle mass. Too much body fat puts you at increased risk for cardiovascular disease, heart attack, stroke, type 2 diabetes, and obesity-related cancers. In addition to exercise, following the Pritikin Eating Plan can help reduce your risk.  Decoding Lab Results  Clinical staff conducted group or individual video education with verbal and written material and guidebook.  Patient learns that lab test reflects one measurement whose values change over time and are influenced by many factors, including medication, stress, sleep, exercise, food, hydration, pre-existing medical conditions, and more. It is recommended to use the knowledge from this video to become more involved with your lab results and evaluate your numbers to speak with your doctor.   Diseases of Our Time - Overview  Clinical staff conducted group or individual video education with verbal and written material and guidebook.  Patient learns that according to the CDC, 50% to 70% of chronic diseases (such as obesity, type 2 diabetes, elevated lipids, hypertension, and heart disease) are avoidable through lifestyle improvements including healthier food choices, listening to satiety cues, and increased physical activity.  Sleep Disorders Clinical staff conducted group or individual video education with verbal and written material and guidebook.  Patient learns how good quality and duration of sleep are important to overall health and well-being. Patient also learns about sleep disorders and how they impact health along with recommendations to address them, including discussing with a physician.  Nutrition  Dining Out - Part 2 Clinical staff conducted group or individual video education with verbal and written material and guidebook.  Patient learns how to plan ahead and communicate in order to maximize their dining experience in a healthy and nutritious manner. Included are recommended food choices based on the type of restaurant the patient  is visiting.   Fueling a Banker conducted group or individual video education with verbal and written material and guidebook.  There is a strong connection between our food choices and our health. Diseases like obesity and type 2 diabetes are very prevalent and are in large-part due to lifestyle choices. The Pritikin Eating Plan provides plenty of food and hunger-curbing satisfaction. It is easy to follow, affordable, and helps reduce health risks.  Menu Workshop  Clinical staff conducted group or individual video education with verbal and written material and guidebook.  Patient learns that restaurant meals can sabotage health goals because they are often packed with calories, fat, sodium, and sugar. Recommendations include strategies to plan ahead and to communicate with the manager, chef, or server to help order a healthier meal.  Planning Your Eating Strategy  Clinical staff conducted group or individual video education with verbal and written material and guidebook.  Patient learns about the Pritikin Eating Plan and its benefit of reducing the risk of disease. The Pritikin Eating Plan does not focus on calories. Instead, it emphasizes high-quality, nutrient-rich foods. By knowing the characteristics of the foods, we choose, we can determine their calorie density and make informed decisions.  Targeting Your Nutrition Priorities  Clinical staff conducted group or individual video education with verbal and written material and guidebook.  Patient learns that lifestyle habits have a tremendous impact on disease risk and progression. This video provides eating and physical activity recommendations based on your personal health goals, such as reducing LDL cholesterol, losing weight, preventing or controlling type 2 diabetes, and  reducing high blood pressure.  Vitamins and Minerals  Clinical staff conducted group or individual video education with verbal and written material  and guidebook.  Patient learns different ways to obtain key vitamins and minerals, including through a recommended healthy diet. It is important to discuss all supplements you take with your doctor.   Healthy Mind-Set    Smoking Cessation  Clinical staff conducted group or individual video education with verbal and written material and guidebook.  Patient learns that cigarette smoking and tobacco addiction pose a serious health risk which affects millions of people. Stopping smoking will significantly reduce the risk of heart disease, lung disease, and many forms of cancer. Recommended strategies for quitting are covered, including working with your doctor to develop a successful plan.  Culinary   Becoming a Set designer conducted group or individual video education with verbal and written material and guidebook.  Patient learns that cooking at home can be healthy, cost-effective, quick, and puts them in control. Keys to cooking healthy recipes will include looking at your recipe, assessing your equipment needs, planning ahead, making it simple, choosing cost-effective seasonal ingredients, and limiting the use of added fats, salts, and sugars.  Cooking - Breakfast and Snacks  Clinical staff conducted group or individual video education with verbal and written material and guidebook.  Patient learns how important breakfast is to satiety and nutrition through the entire day. Recommendations include key foods to eat during breakfast to help stabilize blood sugar levels and to prevent overeating at meals later in the day. Planning ahead is also a key component.  Cooking - Educational psychologist conducted group or individual video education with verbal and written material and guidebook.  Patient learns eating strategies to improve overall health, including an approach to cook more at home. Recommendations include thinking of animal protein as a side on your plate rather  than center stage and focusing instead on lower calorie dense options like vegetables, fruits, whole grains, and plant-based proteins, such as beans. Making sauces in large quantities to freeze for later and leaving the skin on your vegetables are also recommended to maximize your experience.  Cooking - Healthy Salads and Dressing Clinical staff conducted group or individual video education with verbal and written material and guidebook.  Patient learns that vegetables, fruits, whole grains, and legumes are the foundations of the Pritikin Eating Plan. Recommendations include how to incorporate each of these in flavorful and healthy salads, and how to create homemade salad dressings. Proper handling of ingredients is also covered. Cooking - Soups and [employer] - Soups and Desserts Clinical staff conducted group or individual video education with verbal and written material and guidebook.  Patient learns that Pritikin soups and desserts make for easy, nutritious, and delicious snacks and meal components that are low in sodium, fat, sugar, and calorie density, while high in vitamins, minerals, and filling fiber. Recommendations include simple and healthy ideas for soups and desserts.   Overview     The Pritikin Solution Program Overview Clinical staff conducted group or individual video education with verbal and written material and guidebook.  Patient learns that the results of the Pritikin Program have been documented in more than 100 articles published in peer-reviewed journals, and the benefits include reducing risk factors for (and, in some cases, even reversing) high cholesterol, high blood pressure, type 2 diabetes, obesity, and more! An overview of the three key pillars of the Pritikin Program will be covered: eating  well, doing regular exercise, and having a healthy mind-set.  WORKSHOPS  Exercise: Exercise Basics: Building Your Action Plan Clinical staff led group instruction and  group discussion with PowerPoint presentation and patient guidebook. To enhance the learning environment the use of posters, models and videos may be added. At the conclusion of this workshop, patients will comprehend the difference between physical activity and exercise, as well as the benefits of incorporating both, into their routine. Patients will understand the FITT (Frequency, Intensity, Time, and Type) principle and how to use it to build an exercise action plan. In addition, safety concerns and other considerations for exercise and cardiac rehab will be addressed by the presenter. The purpose of this lesson is to promote a comprehensive and effective weekly exercise routine in order to improve patients' overall level of fitness.   Managing Heart Disease: Your Path to a Healthier Heart Clinical staff led group instruction and group discussion with PowerPoint presentation and patient guidebook. To enhance the learning environment the use of posters, models and videos may be added.At the conclusion of this workshop, patients will understand the anatomy and physiology of the heart. Additionally, they will understand how Pritikin's three pillars impact the risk factors, the progression, and the management of heart disease.  The purpose of this lesson is to provide a high-level overview of the heart, heart disease, and how the Pritikin lifestyle positively impacts risk factors.  Exercise Biomechanics Clinical staff led group instruction and group discussion with PowerPoint presentation and patient guidebook. To enhance the learning environment the use of posters, models and videos may be added. Patients will learn how the structural parts of their bodies function and how these functions impact their daily activities, movement, and exercise. Patients will learn how to promote a neutral spine, learn how to manage pain, and identify ways to improve their physical movement in order to  promote healthy living. The purpose of this lesson is to expose patients to common physical limitations that impact physical activity. Participants will learn practical ways to adapt and manage aches and pains, and to minimize their effect on regular exercise. Patients will learn how to maintain good posture while sitting, walking, and lifting.  Balance Training and Fall Prevention  Clinical staff led group instruction and group discussion with PowerPoint presentation and patient guidebook. To enhance the learning environment the use of posters, models and videos may be added. At the conclusion of this workshop, patients will understand the importance of their sensorimotor skills (vision, proprioception, and the vestibular system) in maintaining their ability to balance as they age. Patients will apply a variety of balancing exercises that are appropriate for their current level of function. Patients will understand the common causes for poor balance, possible solutions to these problems, and ways to modify their physical environment in order to minimize their fall risk. The purpose of this lesson is to teach patients about the importance of maintaining balance as they age and ways to minimize their risk of falling.  WORKSHOPS   Nutrition:  Fueling a Ship broker led group instruction and group discussion with PowerPoint presentation and patient guidebook. To enhance the learning environment the use of posters, models and videos may be added. Patients will review the foundational principles of the Pritikin Eating Plan and understand what constitutes a serving size in each of the food groups. Patients will also learn Pritikin-friendly foods that are better choices when away from home and review make-ahead meal and snack options. Calorie density will be reviewed  and applied to three nutrition priorities: weight maintenance, weight loss, and weight gain. The purpose of this lesson is to  reinforce (in a group setting) the key concepts around what patients are recommended to eat and how to apply these guidelines when away from home by planning and selecting Pritikin-friendly options. Patients will understand how calorie density may be adjusted for different weight management goals.  Mindful Eating  Clinical staff led group instruction and group discussion with PowerPoint presentation and patient guidebook. To enhance the learning environment the use of posters, models and videos may be added. Patients will briefly review the concepts of the Pritikin Eating Plan and the importance of low-calorie dense foods. The concept of mindful eating will be introduced as well as the importance of paying attention to internal hunger signals. Triggers for non-hunger eating and techniques for dealing with triggers will be explored. The purpose of this lesson is to provide patients with the opportunity to review the basic principles of the Pritikin Eating Plan, discuss the value of eating mindfully and how to measure internal cues of hunger and fullness using the Hunger Scale. Patients will also discuss reasons for non-hunger eating and learn strategies to use for controlling emotional eating.  Targeting Your Nutrition Priorities Clinical staff led group instruction and group discussion with PowerPoint presentation and patient guidebook. To enhance the learning environment the use of posters, models and videos may be added. Patients will learn how to determine their genetic susceptibility to disease by reviewing their family history. Patients will gain insight into the importance of diet as part of an overall healthy lifestyle in mitigating the impact of genetics and other environmental insults. The purpose of this lesson is to provide patients with the opportunity to assess their personal nutrition priorities by looking at their family history, their own health history and current risk factors. Patients will  also be able to discuss ways of prioritizing and modifying the Pritikin Eating Plan for their highest risk areas  Menu  Clinical staff led group instruction and group discussion with PowerPoint presentation and patient guidebook. To enhance the learning environment the use of posters, models and videos may be added. Using menus brought in from [employer], or printed from [employer], patients will apply the Pritikin dining out guidelines that were presented in the [employer] video. Patients will also be able to practice these guidelines in a variety of provided scenarios. The purpose of this lesson is to provide patients with the opportunity to practice hands-on learning of the Pritikin Dining Out guidelines with actual menus and practice scenarios.  Label Reading Clinical staff led group instruction and group discussion with PowerPoint presentation and patient guidebook. To enhance the learning environment the use of posters, models and videos may be added. Patients will review and discuss the Pritikin label reading guidelines presented in Pritikin's Label Reading Educational series video. Using fool labels brought in from local grocery stores and markets, patients will apply the label reading guidelines and determine if the packaged food meet the Pritikin guidelines. The purpose of this lesson is to provide patients with the opportunity to review, discuss, and practice hands-on learning of the Pritikin Label Reading guidelines with actual packaged food labels. Cooking School  Pritikin's [employer] are designed to teach patients ways to prepare quick, simple, and affordable recipes at home. The importance of nutrition's role in chronic disease risk reduction is reflected in its emphasis in the overall Pritikin program. By learning how to prepare essential  core Pritikin Eating Plan recipes, patients will increase control over what they eat; be able to customize the  flavor of foods without the use of added salt, sugar, or fat; and improve the quality of the food they consume. By learning a set of core recipes which are easily assembled, quickly prepared, and affordable, patients are more likely to prepare more healthy foods at home. These workshops focus on convenient breakfasts, simple entres, side dishes, and desserts which can be prepared with minimal effort and are consistent with nutrition recommendations for cardiovascular risk reduction. Cooking [employer] are taught by a Armed forces logistics/support/administrative officer (RD) who has been trained by the [employer]. The chef or RD has a clear understanding of the importance of minimizing - if not completely eliminating - added fat, sugar, and sodium in recipes. Throughout the series of Cooking School Workshop sessions, patients will learn about healthy ingredients and efficient methods of cooking to build confidence in their capability to prepare    Cooking School weekly topics:  Adding Flavor- Sodium-Free  Fast and Healthy Breakfasts  Powerhouse Plant-Based Proteins  Satisfying Salads and Dressings  Simple Sides and Sauces  International Cuisine-Spotlight on the [employer] Zones  Delicious Desserts  Savory Soups  [employer] - Meals in a Astronomer Appetizers and Snacks  Comforting Weekend Breakfasts  One-Pot Wonders   Fast Evening Meals  Landscape architect Your [facility] Plate  WORKSHOPS   Healthy Mindset (Psychosocial):  Focused Goals, Sustainable Changes Clinical staff led group instruction and group discussion with PowerPoint presentation and patient guidebook. To enhance the learning environment the use of posters, models and videos may be added. Patients will be able to apply effective goal setting strategies to establish at least one personal goal, and then take consistent, meaningful action toward that goal. They will learn to identify common barriers to achieving  personal goals and develop strategies to overcome them. Patients will also gain an understanding of how our mind-set can impact our ability to achieve goals and the importance of cultivating a positive and growth-oriented mind-set. The purpose of this lesson is to provide patients with a deeper understanding of how to set and achieve personal goals, as well as the tools and strategies needed to overcome common obstacles which may arise along the way.  From Head to Heart: The Power of a Healthy Outlook  Clinical staff led group instruction and group discussion with PowerPoint presentation and patient guidebook. To enhance the learning environment the use of posters, models and videos may be added. Patients will be able to recognize and describe the impact of emotions and mood on physical health. They will discover the importance of self-care and explore self-care practices which may work for them. Patients will also learn how to utilize the 4 C's to cultivate a healthier outlook and better manage stress and challenges. The purpose of this lesson is to demonstrate to patients how a healthy outlook is an essential part of maintaining good health, especially as they continue their cardiac rehab journey.  Healthy Sleep for a Healthy Heart Clinical staff led group instruction and group discussion with PowerPoint presentation and patient guidebook. To enhance the learning environment the use of posters, models and videos may be added. At the conclusion of this workshop, patients will be able to demonstrate knowledge of the importance of sleep to overall health, well-being, and quality of life. They will understand the symptoms of, and treatments for, common sleep disorders. Patients will also be  able to identify daytime and nighttime behaviors which impact sleep, and they will be able to apply these tools to help manage sleep-related challenges. The purpose of this lesson is to provide patients with a general  overview of sleep and outline the importance of quality sleep. Patients will learn about a few of the most common sleep disorders. Patients will also be introduced to the concept of "sleep hygiene," and discover ways to self-manage certain sleeping problems through simple daily behavior changes. Finally, the workshop will motivate patients by clarifying the links between quality sleep and their goals of heart-healthy living.   Recognizing and Reducing Stress Clinical staff led group instruction and group discussion with PowerPoint presentation and patient guidebook. To enhance the learning environment the use of posters, models and videos may be added. At the conclusion of this workshop, patients will be able to understand the types of stress reactions, differentiate between acute and chronic stress, and recognize the impact that chronic stress has on their health. They will also be able to apply different coping mechanisms, such as reframing negative self-talk. Patients will have the opportunity to practice a variety of stress management techniques, such as deep abdominal breathing, progressive muscle relaxation, and/or guided imagery.  The purpose of this lesson is to educate patients on the role of stress in their lives and to provide healthy techniques for coping with it.  Learning Barriers/Preferences:  Learning Barriers/Preferences - 07/02/24 1028       Learning Barriers/Preferences   Learning Barriers None    Learning Preferences Audio;Computer/Internet;Group Instruction;Individual Instruction;Pictoral;Skilled Demonstration;Verbal Instruction;Video;Written Material          Education Topics:  Knowledge Questionnaire Score:  Knowledge Questionnaire Score - 07/02/24 1347       Knowledge Questionnaire Score   Pre Score 22/24          Core Components/Risk Factors/Patient Goals at Admission:  Personal Goals and Risk Factors at Admission - 07/02/24 1007       Core Components/Risk  Factors/Patient Goals on Admission    Weight Management Yes;Obesity;Weight Loss    Intervention Weight Management/Obesity: Establish reasonable short term and long term weight goals.;Obesity: Provide education and appropriate resources to help participant work on and attain dietary goals.    Admit Weight 188 lb 15 oz (85.7 kg)    Goal Weight: Short Term 172 lb 12.8 oz (78.4 kg)    Goal Weight: Long Term 160 lb (72.6 kg)    Expected Outcomes Short Term: Continue to assess and modify interventions until short term weight is achieved;Long Term: Adherence to nutrition and physical activity/exercise program aimed toward attainment of established weight goal;Weight Loss: Understanding of general recommendations for a balanced deficit meal plan, which promotes 1-2 lb weight loss per week and includes a negative energy balance of [phone] kcal/d    Diabetes Yes    Intervention Provide education about signs/symptoms and action to take for hypo/hyperglycemia.;Provide education about proper nutrition, including hydration, and aerobic/resistive exercise prescription along with prescribed medications to achieve blood glucose in normal ranges: Fasting glucose 65-99 mg/dL    Expected Outcomes Short Term: Participant verbalizes understanding of the signs/symptoms and immediate care of hyper/hypoglycemia, proper foot care and importance of medication, aerobic/resistive exercise and nutrition plan for blood glucose control.;Long Term: Attainment of HbA1C < 7%.    Hypertension Yes    Intervention Provide education on lifestyle modifcations including regular physical activity/exercise, weight management, moderate sodium restriction and increased consumption of fresh fruit, vegetables, and low fat dairy, alcohol moderation, and smoking  cessation.;Monitor prescription use compliance.    Expected Outcomes Short Term: Continued assessment and intervention until BP is < 140/94mm HG in hypertensive participants. < 130/86mm HG in  hypertensive participants with diabetes, heart failure or chronic kidney disease.;Long Term: Maintenance of blood pressure at goal levels.    Lipids Yes    Intervention Provide education and support for participant on nutrition & aerobic/resistive exercise along with prescribed medications to achieve LDL 70mg , HDL >40mg .    Expected Outcomes Short Term: Participant states understanding of desired cholesterol values and is compliant with medications prescribed. Participant is following exercise prescription and nutrition guidelines.;Long Term: Cholesterol controlled with medications as prescribed, with individualized exercise RX and with personalized nutrition plan. Value goals: LDL < 70mg , HDL > 40 mg.          Core Components/Risk Factors/Patient Goals Review:   Goals and Risk Factor Review     Row Name 07/10/24 1651 08/13/24 1542           Core Components/Risk Factors/Patient Goals Review   Personal Goals Review Weight Management/Obesity;Diabetes;Hypertension;Lipids Weight Management/Obesity;Diabetes;Hypertension;Lipids      Review Shaunee started cardiac rehab on 07/10/24. Briahna did well with exercise. vital signs and CBg's were stable. Shamecka is doing well with exercise. vital signs and CBg's have been stable. Oaklee has increased her workloads      Expected Outcomes Malaak will continue to participate in cardiac rehab for exercise, nutrtion and lifestyle modifications Oriah will continue to participate in cardiac rehab for exercise, nutrtion and lifestyle modifications         Core Components/Risk Factors/Patient Goals at Discharge (Final Review):   Goals and Risk Factor Review - 08/13/24 1542       Core Components/Risk Factors/Patient Goals Review   Personal Goals Review Weight Management/Obesity;Diabetes;Hypertension;Lipids    Review Breeanna is doing well with exercise. vital signs and CBg's have been stable. Nimah has increased her workloads    Expected Outcomes  Blu will continue to participate in cardiac rehab for exercise, nutrtion and lifestyle modifications          ITP Comments:  ITP Comments     Row Name 07/02/24 0902 07/10/24 1650 08/13/24 1533       ITP Comments Medical Director- Dr. Wilbert Bihari, MD. Introduction to the [facility]/ Intensive Cardiac Rehab Program. Review of initial orientation folder. 30 Day ITP Review. Thressa started cardiac rehab on 07/10/24. Johni did well with exercise. 30 Day ITP Review. Eneida has good attendance and participation with exercise at cardiac rehab.        Comments: See ITP comments.[name] RN BSN

## 2024-08-14 ENCOUNTER — Encounter (HOSPITAL_COMMUNITY)
Admission: RE | Admit: 2024-08-14 | Discharge: 2024-08-14 | Disposition: A | Source: Ambulatory Visit | Attending: Cardiology

## 2024-08-14 DIAGNOSIS — Z955 Presence of coronary angioplasty implant and graft: Secondary | ICD-10-CM | POA: Diagnosis not present

## 2024-08-16 ENCOUNTER — Encounter (HOSPITAL_COMMUNITY)
Admission: RE | Admit: 2024-08-16 | Discharge: 2024-08-16 | Disposition: A | Discharge status: Home / Self Care | Source: Ambulatory Visit | Attending: Cardiology | Admitting: Cardiology

## 2024-08-16 DIAGNOSIS — Z955 Presence of coronary angioplasty implant and graft: Secondary | ICD-10-CM | POA: Diagnosis not present

## 2024-08-19 ENCOUNTER — Encounter (HOSPITAL_COMMUNITY): Admission: RE | Admit: 2024-08-19

## 2024-08-20 ENCOUNTER — Encounter (HOSPITAL_BASED_OUTPATIENT_CLINIC_OR_DEPARTMENT_OTHER): Payer: Self-pay

## 2024-08-20 ENCOUNTER — Emergency Department (HOSPITAL_BASED_OUTPATIENT_CLINIC_OR_DEPARTMENT_OTHER)
Admission: EM | Admit: 2024-08-20 | Discharge: 2024-08-20 | Disposition: A | Discharge status: Home / Self Care | Attending: Emergency Medicine | Admitting: Emergency Medicine

## 2024-08-20 ENCOUNTER — Other Ambulatory Visit: Payer: Self-pay

## 2024-08-20 ENCOUNTER — Emergency Department (HOSPITAL_BASED_OUTPATIENT_CLINIC_OR_DEPARTMENT_OTHER)

## 2024-08-20 ENCOUNTER — Telehealth (HOSPITAL_COMMUNITY): Payer: Self-pay

## 2024-08-20 DIAGNOSIS — W108XXA Fall (on) (from) other stairs and steps, initial encounter: Secondary | ICD-10-CM | POA: Diagnosis not present

## 2024-08-20 DIAGNOSIS — Z79899 Other long term (current) drug therapy: Secondary | ICD-10-CM | POA: Insufficient documentation

## 2024-08-20 DIAGNOSIS — Z7982 Long term (current) use of aspirin: Secondary | ICD-10-CM | POA: Insufficient documentation

## 2024-08-20 DIAGNOSIS — S060X0A Concussion without loss of consciousness, initial encounter: Secondary | ICD-10-CM | POA: Diagnosis present

## 2024-08-20 NOTE — ED Provider Notes (Signed)
 [place] [facility]ERG[facility] DEPARTMENT AT [facility] Provider Note   CSN: [id number] Arrival date & time: 08/20/24  1450     Patient presents with: Virginia [name] Virginia [name] is a 63 y.o. female.   HPI 63 year old female presents after a fall and head injury.  Patient was carrying a stack of books and missed a step and fell down about 16 steps yesterday morning.  No LOC.  Does not remember hitting her head but since yesterday evening has been having a headache.  Today she has been having on and off dull headache as well as dizziness and lightheadedness that comes and goes.  No vision changes, vomiting, or focal weakness.  She is on Brilinta , and when she talk to the cardiac rehab team they recommended she come to the ER for evaluation.  Patient denies any neck pain, chest or rib pain, or any other significant injury besides her right calf has been hurting.  She felt like there was a bruise there yesterday and it feels sore but she has been able to ambulate and does not feel any bony tenderness.  No preceding symptoms prior to the fall.  Prior to Admission medications   Medication Sig Start Date End Date Taking? Authorizing Provider  acetaminophen  (TYLENOL ) 500 MG tablet Take 1 tablet (500 mg total) by mouth every 6 (six) hours as needed. 04/27/21   Tilford Bertram HERO, FNP  amLODipine  (NORVASC ) 5 MG tablet Take 1 tablet (5 mg total) by mouth daily. 06/07/24   Paseda, Folashade R, FNP  aspirin  EC 81 MG tablet Take 1 tablet (81 mg total) by mouth daily. 09/27/19   Stroud, Natalie M, FNP  empagliflozin  (JARDIANCE ) 10 MG TABS tablet Take 1 tablet (10 mg total) by mouth daily before breakfast. 06/27/24   Ladona Heinz, MD  Evolocumab  (REPATHA  SURECLICK) 140 MG/ML SOAJ Inject 140 mg into the skin every 14 (fourteen) days. 06/10/24   Ladona Heinz, MD  ezetimibe  (ZETIA ) 10 MG tablet Take 1 tablet (10 mg total) by mouth at bedtime. Patient not taking: Reported on 07/02/2024 05/29/24   Ladona Heinz, MD  losartan   (COZAAR ) 50 MG tablet Take 1 tablet (50 mg total) by mouth daily. 06/07/24   Paseda, Folashade R, FNP  metoprolol  succinate (TOPROL -XL) 25 MG 24 hr tablet Take 1 tablet (25 mg total) by mouth daily. 07/02/24   Ladona Heinz, MD  nitroGLYCERIN  (NITROSTAT ) 0.4 MG SL tablet Place 1 tablet (0.4 mg total) under the tongue every 5 (five) minutes as needed for chest pain. 05/28/24   Ladona Heinz, MD  pantoprazole  (PROTONIX ) 40 MG tablet Take 1 tablet (40 mg total) by mouth daily. 06/06/24   Maccia, Melissa D, RPH-CPP  rosuvastatin  (CRESTOR ) 40 MG tablet Take 1 tablet (40 mg total) by mouth daily. 06/07/24   Paseda, Folashade R, FNP  ticagrelor  (BRILINTA ) 90 MG TABS tablet Take 1 tablet (90 mg total) by mouth 2 (two) times daily. 06/27/24   Ladona Heinz, MD  lisinopril  (ZESTRIL ) 2.5 MG tablet Take 1 tablet (2.5 mg total) by mouth daily. 10/13/20 11/26/20  Stroud, Natalie M, FNP  metoprolol  tartrate (LOPRESSOR ) 25 MG tablet Take 0.5 tablets (12.5 mg total) by mouth 2 (two) times daily. 10/13/20 11/26/20  Stroud, Natalie M, FNP    Allergies: Patient has no known allergies.    Review of Systems  Respiratory:  Negative for shortness of breath.   Cardiovascular:  Negative for chest pain.  Gastrointestinal:  Negative for vomiting.  Musculoskeletal:  Positive for myalgias.  Negative for neck pain.  Neurological:  Positive for dizziness, light-headedness and headaches. Negative for weakness.    Updated Vital Signs BP 127/67 (BP Location: Left Arm)   Pulse 81   Temp 97.7 F (36.5 C)   Resp 18   LMP 12/29/2015   SpO2 100%   Physical Exam Vitals and nursing note reviewed.  Constitutional:      General: She is not in acute distress.    Appearance: She is well-developed. She is not ill-appearing or diaphoretic.  HENT:     Head: Normocephalic.   Eyes:     Extraocular Movements: Extraocular movements intact.     Pupils: Pupils are equal, round, and reactive to light.  Cardiovascular:     Rate and Rhythm: Normal rate  and regular rhythm.     Pulses:          Posterior tibial pulses are 2+ on the right side.     Heart sounds: Normal heart sounds.  Pulmonary:     Effort: Pulmonary effort is normal.     Breath sounds: Normal breath sounds.  Abdominal:     General: There is no distension.     Palpations: Abdomen is soft.     Tenderness: There is no abdominal tenderness.  Musculoskeletal:     Right hip: Normal range of motion.     Right knee: Normal range of motion.     Right lower leg: Tenderness (mild, calf, no swelling) present. No bony tenderness.  Skin:    General: Skin is warm and dry.  Neurological:     Mental Status: She is alert.     Comments: CN 3-12 grossly intact. 5/5 strength in all 4 extremities. Grossly normal sensation. Normal finger to nose.      (all labs ordered are listed, but only abnormal results are displayed) Labs Reviewed - No data to display  EKG: None  Radiology: CT Head Wo Contrast Result Date: 08/20/2024 CLINICAL DATA:  Status post trauma. EXAM: CT HEAD WITHOUT CONTRAST TECHNIQUE: Contiguous axial images were obtained from the base of the skull through the vertex without intravenous contrast. RADIATION DOSE REDUCTION: This exam was performed according to the departmental dose-optimization program which includes automated exposure control, adjustment of the mA and/or kV according to patient size and/or use of iterative reconstruction technique. COMPARISON:  April 23, 2021 FINDINGS: Brain: No evidence of acute infarction, hemorrhage, hydrocephalus, extra-axial collection or mass lesion/mass effect. Vascular: No hyperdense vessel or unexpected calcification. Skull: Normal. Negative for fracture or focal lesion. Sinuses/Orbits: No acute finding. Other: None. IMPRESSION: No acute intracranial pathology. Electronically Signed   By: Suzen Dials M.D.   On: 08/20/2024 15:46     Procedures   Medications Ordered in the ED - No data to display                                   Medical Decision Making Amount and/or Complexity of Data Reviewed External Data Reviewed: notes. Radiology: ordered and independent interpretation performed.    Details: No head bleed   Patient appears to have a mild concussion based on history.  Exam is unremarkable.  She does have some mild calf tenderness from the fall itself, but very low suspicion for fracture.  Neuroexam is unremarkable.  CT head shows no signs of head bleed or other significant trauma.  There was no LOC or preceding symptoms.  Dizziness is most likely related to the  mild head injury.  At this point, she appears stable for discharge, especially 24+ hours after the injury.  Will discharge with return precautions.     Final diagnoses:  Concussion without loss of consciousness, initial encounter    ED Discharge Orders     None          Freddi Hamilton, MD 08/20/24 [phone]

## 2024-08-20 NOTE — ED Notes (Signed)
 ED Provider at bedside.

## 2024-08-20 NOTE — ED Triage Notes (Signed)
 Pt states tripped on steps, fell down 16 steps yesterday.  Reports R side of head, lightheaded, dizzy.  Denies headache, NV, visual changes No LOC, takes blood thinners  Reports knot on RLE on calf.   Generalized body aches

## 2024-08-20 NOTE — Discharge Instructions (Signed)
 Your CT scan today was reassuring.  No signs of bleeding or swelling.  You may use Tylenol  and/or ice to help with any pain in the affected areas.  Otherwise, follow-up with your primary care provider as needed.  If you develop new or worsening headache, vomiting, or any other new/concerning symptoms then return to the ER.

## 2024-08-20 NOTE — Telephone Encounter (Signed)
 Pt called staff today to inform us  that she fell at home down the stairs yesterday morning about 5:45 am. She voices being quite sore and is unsure if she will able to continue exercise at this time. When questioned, pt voices that she did not seek treatment after her fall. She denies hitting her head but voices some swelling around her temple today. I, as well as Hadassah Quan, RN spoke with the patient and encouraged her to be seen in the ED today since she is on Brilinta . Pt voices that she will seek treatment/evaluation.  Will will f/u a call to patient later this week.   Alm Parkins MS, ACSM-CEP, CCRP

## 2024-08-20 NOTE — ED Notes (Signed)
Patient transported to CT and back without event.

## 2024-08-21 ENCOUNTER — Encounter (HOSPITAL_COMMUNITY)

## 2024-08-23 ENCOUNTER — Encounter (HOSPITAL_COMMUNITY): Admission: RE | Admit: 2024-08-23 | Source: Ambulatory Visit

## 2024-08-26 ENCOUNTER — Encounter (HOSPITAL_COMMUNITY): Admission: RE | Admit: 2024-08-26 | Source: Ambulatory Visit

## 2024-08-26 ENCOUNTER — Telehealth (HOSPITAL_COMMUNITY): Payer: Self-pay | Admitting: *Deleted

## 2024-08-26 NOTE — Telephone Encounter (Signed)
 Attempted to call unable to leave message.[name] RN BSN

## 2024-08-28 ENCOUNTER — Encounter (HOSPITAL_COMMUNITY): Admission: RE | Admit: 2024-08-28 | Source: Ambulatory Visit

## 2024-08-28 ENCOUNTER — Telehealth (HOSPITAL_COMMUNITY): Payer: Self-pay

## 2024-08-28 NOTE — Telephone Encounter (Signed)
 Attempted to call patient regarding 8:15 cardiac rehab class- no answer, unable to leave message on listed number (husband's cell). Attempted alternate number listed in demographics, number is not in service. Patient is not on MyChart. Unsure if patient intends to return to cardiac rehab.

## 2024-08-29 ENCOUNTER — Ambulatory Visit: Attending: Internal Medicine | Admitting: Cardiology

## 2024-08-29 ENCOUNTER — Encounter: Payer: Self-pay | Admitting: Cardiology

## 2024-08-29 VITALS — BP 126/72 | HR 70 | Resp 16 | Ht 66.0 in | Wt 182.4 lb

## 2024-08-29 DIAGNOSIS — E7841 Elevated Lipoprotein(a): Secondary | ICD-10-CM | POA: Diagnosis not present

## 2024-08-29 DIAGNOSIS — E7879 Other disorders of bile acid and cholesterol metabolism: Secondary | ICD-10-CM | POA: Diagnosis present

## 2024-08-29 DIAGNOSIS — I2510 Atherosclerotic heart disease of native coronary artery without angina pectoris: Secondary | ICD-10-CM | POA: Insufficient documentation

## 2024-08-29 DIAGNOSIS — I10 Essential (primary) hypertension: Secondary | ICD-10-CM | POA: Insufficient documentation

## 2024-08-29 DIAGNOSIS — E118 Type 2 diabetes mellitus with unspecified complications: Secondary | ICD-10-CM | POA: Diagnosis present

## 2024-08-29 MED ORDER — EMPAGLIFLOZIN 10 MG PO TABS: 10.0000 mg | ORAL_TABLET | Freq: Every day | ORAL | 3 refills | Status: AC

## 2024-08-29 NOTE — Progress Notes (Signed)
 " Cardiology Office Note:  .   Date:  08/30/2024  ID:  Virginia [name], DOB 1961-09-07, MRN [id number] PCP: Paseda, Folashade R, FNP  [facility] Providers Cardiologist:  Gordy Bergamo, MD Cardiology APP:  Rana Lum CROME, NP   History of Present Illness: Virginia   HEND [name] is a 63 y.o.  type 2 diabetes, hypertension, CAD with history of angioplasty of the CX with BMS in 2016 and DES to proximal LAD in January 2022, familial hypercholesterolemia, recent mission to [facility] on 04/02/2024 with TIA involving right PCA distal PI stenosis, stroke felt to be due to small vessel disease, discharged to rehab and returned home and has essentially recuperated from most of the right upper and lower extremity weakness   Admitted with NSTEMI on 05/28/2024 and underwent stenting to distal LAD, mid Cx and mid RCA and discharged home.  Had normal echocardiogram with mild LVH.  She is presently doing well and remains asymptomatic.  Has been losing weight and has been exercising on a regular basis.  Cardiac Studies relevent.    Cardiac catheterization 05/27/2024  Cx: STENT SYNERGY XD 3.0X12  RCA: STENT SYNERGY XD 7.49K75  LAD: STENT SYNERGY XD 7.49K61.      Echocardiogram 05/28/2024     Discussed the use of AI scribe software for clinical note transcription with the patient, who gave verbal consent to proceed.  History of Present Illness Virginia [name] is a 63 year old female with familial hypercholesterolemia, coronary artery disease, and diabetes who presents for follow-up and medication refill.  She has familial hypercholesterolemia, managed with Repatha , though her cholesterol levels have not been checked since her last hospital visit. Her cardiac history includes multiple stent placements in 2016, 2022, and 2025, a stroke in May 2025, and a small heart attack with unstable angina and STEMI in July 2025. She takes aspirin  81 mg daily and Brilinta  90 mg twice daily for coronary  artery disease, with no recent nitroglycerin  use.  Her diabetes is well controlled with Jardiance , but she requires a refill due to a pharmacy delay. She maintains a careful diet and is losing weight.  Blood pressure is well managed with losartan  50 mg daily and amlodipine  5 mg daily. 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  Labs   Lab Results  Component Value Date   CHOL 123 05/27/2024   HDL 46 05/27/2024   LDLCALC 56 05/27/2024   TRIG 105 05/27/2024   CHOLHDL 2.7 05/27/2024   Lipoprotein (a)  Date/Time Value Ref Range Status  05/27/2024 02:41 AM 298.8 (H) <75.0 nmol/L Final    Comment:    (NOTE) **Results verified by repeat testing** This test was developed and its performance characteristics determined by [facility]. It has not been cleared or approved by the Food and Drug Administration. Note:  Values greater than or equal to 75.0 nmol/L may       indicate an independent risk factor for CHD,       but must be evaluated with caution when applied       to non-Caucasian populations due to the       influence of genetic factors on Lp(a) across       ethnicities. Performed At: [facility] [address] [place], [place] [id number] Jennette Shorter MD Ey:[id number]     Recent Labs    05/26/24 1227 05/27/24 0241 05/28/24 0218 06/06/24 1509  NA  137 136 137 141  K 4.4 4.1 4.2 3.9  CL 104 105 106 106  CO2 22 21* 22 20  GLUCOSE 106* 122* 111* 99  BUN 7* 10 11 12   CREATININE 0.90 1.04* 1.09* 1.16*  CALCIUM  9.0 9.1 9.5 9.3  GFRNONAA >60 >60 57*  --     Lab Results  Component Value Date   ALT 12 05/26/2024   AST 16 05/26/2024   ALKPHOS 90 05/26/2024   BILITOT 0.7 05/26/2024      Latest Ref Rng & Units 06/06/2024    3:09 PM 05/28/2024    2:18 AM 05/27/2024    2:41 AM  CBC  WBC 3.4 - 10.8 x10E3/uL 6.8  7.3  6.3   Hemoglobin 11.1 - 15.9 g/dL 87.4  86.3  86.5   Hematocrit 34.0 - 46.6 %  38.7  41.8  40.5   Platelets 150 - 450 x10E3/uL 212  212  175    Lab Results  Component Value Date   HGBA1C 6.6 (H) 05/26/2024    Lab Results  Component Value Date   TSH 1.507 02/07/2023    ROS  Review of Systems  Cardiovascular:  Negative for chest pain, dyspnea on exertion and leg swelling.   Physical Exam:   VS:  BP 126/72 (BP Location: Left Arm, Patient Position: Sitting, Cuff Size: Large)   Pulse 70   Resp 16   Ht 5' 6 (1.676 m)   Wt 182 lb 6.4 oz (82.7 kg)   LMP 12/29/2015   SpO2 99%   BMI 29.44 kg/m    Wt Readings from Last 3 Encounters:  08/29/24 182 lb 6.4 oz (82.7 kg)  07/02/24 188 lb 15 oz (85.7 kg)  06/07/24 187 lb (84.8 kg)    BP Readings from Last 3 Encounters:  08/29/24 126/72  08/20/24 127/67  07/02/24 (!) 158/72   Physical Exam Neck:     Vascular: No carotid bruit or JVD.  Cardiovascular:     Rate and Rhythm: Normal rate and regular rhythm.     Pulses:          Dorsalis pedis pulses are 1+ on the right side and 1+ on the left side.       Posterior tibial pulses are 0 on the right side and 0 on the left side.     Heart sounds: Normal heart sounds. No murmur heard.    No gallop.  Pulmonary:     Effort: Pulmonary effort is normal.     Breath sounds: Normal breath sounds.  Abdominal:     General: Bowel sounds are normal.     Palpations: Abdomen is soft.  Musculoskeletal:     Right lower leg: No edema.     Left lower leg: No edema.    EKG:         ASSESSMENT AND PLAN: .      ICD-10-CM   1. Coronary artery disease involving native coronary artery of native heart without angina pectoris  I25.10     2. Elevated lipoprotein(a)  E78.41 Lipid panel    Lipoprotein A (LPA)    3. Familial hypercholanemia  E78.79 Lipid panel    Lipoprotein A (LPA)    4. Essential hypertension  I10     5. Type 2 diabetes mellitus with complication, without long-term current use of insulin  (HCC)  E11.8 empagliflozin  (JARDIANCE ) 10 MG TABS tablet      Assessment & Plan Atherosclerotic heart disease of native coronary artery without angina, status post multiple  stent placements and recent myocardial infarction Experienced a myocardial infarction in July 2025 and has been without angina since. Currently on dual antiplatelet therapy with aspirin  and Brilinta . - Continue aspirin  81 mg once daily - Continue Brilinta  90 mg twice daily for one year - Plan to switch to Plavix after one year of dual antiplatelet therapy - Schedule follow-up in June 2026 to reassess antiplatelet therapy  History of stroke (May 2025) without residual deficits  Essential hypertension, well controlled Hypertension is well controlled with current medication regimen. - Continue losartan  50 mg once daily - Continue amlodipine  5 mg once daily  Type 2 diabetes mellitus, well controlled Diabetes is well controlled with Jardiance  and dietary management, with noted weight loss. - Refill Jardiance  prescription  Familial hypercholesterolemia with elevated lipoprotein(a) Currently on Repatha , effectively controlling LDL cholesterol levels. - Continue Repatha  and Crestor  40 mg daily - Order lipid panel and LPA test to assess current levels   Follow up: June 2026, plan on switching DAPT to Plavix single agent.  Signed,  Gordy Bergamo, MD, [facility] 08/30/2024, 6:09 AM [facility] [address] [place], [place] [address] Phone: [phone]. Fax:  [phone]  "

## 2024-08-29 NOTE — Patient Instructions (Signed)
 Medication Instructions:  Your physician recommends that you continue on your current medications as directed. Please refer to the Current Medication list given to you today.  *If you need a refill on your cardiac medications before your next appointment, please call your pharmacy*  Lab Work: Lipids and LPA If you have labs (blood work) drawn today and your tests are completely normal, you will receive your results only by: MyChart Message (if you have MyChart) OR A paper copy in the mail If you have any lab test that is abnormal or we need to change your treatment, we will call you to review the results.  Testing/Procedures: None ordered.   Follow-Up: At [facility], you and your health needs are our priority.  As part of our continuing mission to provide you with exceptional heart care, our providers are all part of one team.  This team includes your primary Cardiologist (physician) and Advanced Practice Providers or APPs (Physician Assistants and Nurse Practitioners) who all work together to provide you with the care you need, when you need it.  Your next appointment:   June 2026

## 2024-08-30 ENCOUNTER — Encounter (HOSPITAL_COMMUNITY): Admission: RE | Admit: 2024-08-30 | Source: Ambulatory Visit

## 2024-09-02 ENCOUNTER — Encounter (HOSPITAL_COMMUNITY): Admission: RE | Admit: 2024-09-02 | Source: Ambulatory Visit

## 2024-09-03 ENCOUNTER — Telehealth (HOSPITAL_COMMUNITY): Payer: Self-pay | Admitting: *Deleted

## 2024-09-03 NOTE — Telephone Encounter (Signed)
 Called to leave message regarding cardiac rehab attendance no answer.[name] RN BSN

## 2024-09-04 ENCOUNTER — Encounter (HOSPITAL_COMMUNITY): Payer: Self-pay | Admitting: *Deleted

## 2024-09-04 ENCOUNTER — Encounter (HOSPITAL_COMMUNITY)

## 2024-09-04 DIAGNOSIS — Z955 Presence of coronary angioplasty implant and graft: Secondary | ICD-10-CM

## 2024-09-04 NOTE — Progress Notes (Signed)
 Discharge Progress Report  Patient Details  Name: Virginia [name] MRN: [id number] Date of Birth: Nov 12, 1960 Referring Provider:   Flowsheet Row CARDIAC REHAB PHASE II ORIENTATION from 07/02/2024 in [facility]  Referring Provider Ladona Heinz, MD     Number of Visits: 26  Reason for Discharge:  Patient reached a stable level of exercise. Patient independent in their exercise. Patient has met program and personal goals.  Smoking History:  Social History   Tobacco Use  Smoking Status Never  Smokeless Tobacco Never    Diagnosis:  05/30/24 DES LAD x2, LCx  ADL UCSD:   Initial Exercise Prescription:   Discharge Exercise Prescription (Final Exercise Prescription Changes):  Exercise Prescription Changes - 08/16/24 1400       Response to Exercise   Blood Pressure (Admit) 124/80    Blood Pressure (Exit) 122/74    Heart Rate (Admit) 91 bpm    Heart Rate (Exercise) 108 bpm    Heart Rate (Exit) 80 bpm    Rating of Perceived Exertion (Exercise) 12    Symptoms None    Comments HEP    Duration Continue with 30 min of aerobic exercise without signs/symptoms of physical distress.    Intensity THRR unchanged      Progression   Progression Continue to progress workloads to maintain intensity without signs/symptoms of physical distress.    Average METs 2.8      Resistance Training   Training Prescription Yes    Weight 2 lbs    Reps 10-15    Time 5 Minutes      Interval Training   Interval Training No      Treadmill   MPH 2.2    Grade 1    Minutes 15    METs 3      Recumbant Elliptical   Level 2    RPM 52    Watts 59    Minutes 15    METs 2.5      Home Exercise Plan   Plans to continue exercise at Home (comment)          Functional Capacity:   Psychological, QOL, Others - Outcomes: PHQ 2/9:    07/02/2024   10:27 AM 06/07/2024    2:30 PM 08/03/2021   11:05 AM 10/06/2020   10:29 AM 04/03/2020    10:36 AM  Depression screen PHQ 2/9  Decreased Interest 0 0 0 0 0  Down, Depressed, Hopeless 0 0 0 0 0  PHQ - 2 Score 0 0 0 0 0  Altered sleeping 1 0     Tired, decreased energy 1 3     Change in appetite 0 0     Feeling bad or failure about yourself  0 0     Trouble concentrating 0 0     Moving slowly or fidgety/restless 0 3     Suicidal thoughts 0 0     PHQ-9 Score 2 6     Difficult doing work/chores Somewhat difficult Not difficult at all       Quality of Life:   Personal Goals: Goals established at orientation with interventions provided to work toward goal.    Personal Goals Discharge:  Goals and Risk Factor Review     Row Name 07/10/24 1651 08/13/24 1542 09/04/24 0836         Core Components/Risk Factors/Patient Goals Review   Personal Goals Review Weight Management/Obesity;Diabetes;Hypertension;Lipids Weight Management/Obesity;Diabetes;Hypertension;Lipids Weight Management/Obesity;Diabetes;Hypertension;Lipids  Review Kearstyn started cardiac rehab on 07/10/24. Mihira did well with exercise. vital signs and CBg's were stable. Raelee is doing well with exercise. vital signs and CBg's have been stable. Michaelann has increased her workloads Rawan is did well with exercise. vital signs were stable. Lennox has increased her workloads. Ryelee did not return to exercise at cardiac rehab after experiencing a fall at home. Zahraa has been discharged due to nonattendance.     Expected Outcomes Cayci will continue to participate in cardiac rehab for exercise, nutrtion and lifestyle modifications Mckynlee will continue to participate in cardiac rehab for exercise, nutrtion and lifestyle modifications Eileen will continue to  exercise, nutrtion and lifestyle modifications on her own        Exercise Goals and Review:   Exercise Goals Re-Evaluation:  Exercise Goals Re-Evaluation     Row Name 07/10/24 1142 08/12/24 1644           Exercise Goal Re-Evaluation   Exercise  Goals Review Increase Physical Activity;Increase Strength and Stamina;Able to understand and use rate of perceived exertion (RPE) scale;Knowledge and understanding of Target Heart Rate Range (THRR);Understanding of Exercise Prescription Increase Physical Activity;Increase Strength and Stamina;Able to understand and use rate of perceived exertion (RPE) scale;Knowledge and understanding of Target Heart Rate Range (THRR);Understanding of Exercise Prescription      Comments Pt's first day in the CRP2 program. Pt understands the exercise Rx, RPE scale and THRR. Reviewed METs and goals. Pt voices progress on her goals of exercising without SOB adn we igth loss. Pt states the SOB is getting better and pt has lost 1.5 kg to date.      Expected Outcomes Will continue to monitor patient and progress exercise workloads as tolerated. Will continue to monitor patient and progress exercise workloads as tolerated.         Nutrition & Weight - Outcomes:    Nutrition:   Nutrition Discharge:   Education Questionnaire Score:   Marietta attended 26 exercise classes between 07/02/24- 08/14/24 Madeleyn is did well with exercise. vital signs were stable. Coreena has increased her workloads. Christyl did not return to exercise at cardiac rehab after experiencing a fall at home. Yexalen has been discharged due to nonattendance. [name] RN BSN

## 2024-09-06 ENCOUNTER — Encounter (HOSPITAL_COMMUNITY)

## 2024-09-09 ENCOUNTER — Ambulatory Visit: Payer: Self-pay | Admitting: Nurse Practitioner

## 2024-09-09 ENCOUNTER — Encounter (HOSPITAL_COMMUNITY)

## 2024-09-11 ENCOUNTER — Encounter (HOSPITAL_COMMUNITY)

## 2024-09-13 ENCOUNTER — Encounter (HOSPITAL_COMMUNITY)

## 2024-09-16 ENCOUNTER — Encounter (HOSPITAL_COMMUNITY)

## 2024-09-18 ENCOUNTER — Encounter (HOSPITAL_COMMUNITY)

## 2024-09-20 ENCOUNTER — Encounter (HOSPITAL_COMMUNITY)

## 2024-09-23 ENCOUNTER — Encounter (HOSPITAL_COMMUNITY)

## 2024-09-25 ENCOUNTER — Encounter (HOSPITAL_COMMUNITY)

## 2024-09-26 ENCOUNTER — Encounter: Payer: Self-pay | Admitting: Pharmacist

## 2024-09-27 ENCOUNTER — Encounter (HOSPITAL_COMMUNITY)

## 2024-09-30 ENCOUNTER — Encounter (HOSPITAL_COMMUNITY)

## 2024-10-02 ENCOUNTER — Encounter (HOSPITAL_COMMUNITY)

## 2024-10-04 ENCOUNTER — Encounter (HOSPITAL_COMMUNITY)

## 2024-12-12 NOTE — Telephone Encounter (Signed)
 Patient reminded to get repeat lipid panel. She states she is taking Repatha
